# Patient Record
Sex: Female | Born: 1985
Health system: Southern US, Community
[De-identification: ages and names within clinical notes are randomized; demographics above are authoritative.]

## PROBLEM LIST (undated history)

## (undated) ENCOUNTER — Inpatient Hospital Stay (HOSPITAL_COMMUNITY): Payer: Self-pay

## (undated) DIAGNOSIS — O26872 Cervical shortening, second trimester: Secondary | ICD-10-CM

## (undated) DIAGNOSIS — F32A Depression, unspecified: Secondary | ICD-10-CM

## (undated) DIAGNOSIS — G43909 Migraine, unspecified, not intractable, without status migrainosus: Secondary | ICD-10-CM

## (undated) DIAGNOSIS — H9190 Unspecified hearing loss, unspecified ear: Secondary | ICD-10-CM

## (undated) DIAGNOSIS — R55 Syncope and collapse: Secondary | ICD-10-CM

## (undated) DIAGNOSIS — F329 Major depressive disorder, single episode, unspecified: Secondary | ICD-10-CM

## (undated) DIAGNOSIS — F419 Anxiety disorder, unspecified: Secondary | ICD-10-CM

## (undated) HISTORY — DX: Syncope and collapse: R55

## (undated) HISTORY — DX: Major depressive disorder, single episode, unspecified: F32.9

## (undated) HISTORY — DX: Unspecified hearing loss, unspecified ear: H91.90

## (undated) HISTORY — DX: Depression, unspecified: F32.A

## (undated) HISTORY — DX: Anxiety disorder, unspecified: F41.9

---
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
## 2006-02-26 ENCOUNTER — Other Ambulatory Visit: Admission: RE | Admit: 2006-02-26 | Discharge: 2006-02-26 | Payer: Self-pay | Admitting: Internal Medicine

## 2006-11-05 ENCOUNTER — Encounter: Admission: RE | Admit: 2006-11-05 | Discharge: 2006-11-05 | Payer: Self-pay | Admitting: Urology

## 2007-08-03 ENCOUNTER — Other Ambulatory Visit: Admission: RE | Admit: 2007-08-03 | Discharge: 2007-08-03 | Payer: Self-pay | Admitting: Internal Medicine

## 2009-11-05 ENCOUNTER — Other Ambulatory Visit: Admission: RE | Admit: 2009-11-05 | Discharge: 2009-11-05 | Payer: Self-pay | Admitting: Internal Medicine

## 2013-06-24 ENCOUNTER — Encounter (HOSPITAL_COMMUNITY): Payer: Self-pay

## 2013-06-24 ENCOUNTER — Inpatient Hospital Stay (HOSPITAL_COMMUNITY): Payer: 59

## 2013-06-24 ENCOUNTER — Inpatient Hospital Stay (HOSPITAL_COMMUNITY)
Admission: AD | Admit: 2013-06-24 | Discharge: 2013-06-24 | Disposition: A | Discharge status: Home / Self Care | Payer: 59 | Source: Ambulatory Visit | Attending: Obstetrics and Gynecology | Admitting: Obstetrics and Gynecology

## 2013-06-24 DIAGNOSIS — O200 Threatened abortion: Secondary | ICD-10-CM | POA: Insufficient documentation

## 2013-06-24 LAB — HCG, QUANTITATIVE, PREGNANCY: hCG, Beta Chain, Quant, S: 8036 m[IU]/mL — ABNORMAL HIGH (ref <5)

## 2013-06-24 NOTE — MAU Note (Signed)
Carloyn Jaeger, CNM at bedside to speak with pt about discharge.

## 2013-06-24 NOTE — MAU Provider Note (Signed)
History     CSN: [id number]  Arrival date and time: 06/24/13 1741 Provider on unit: 1940 Provider notified: 2000 Provider at bedside: 2005   Chief Complaint  Patient presents with  . Vaginal Bleeding   HPI  [name] is a 27 y.o. G1P0 female at 22.[redacted]wks gestation presenting with vaginal bleeding that started as brown spotting Yesterday increasing to bright red.  She reports the bright, red soaked through a one pantiliner today.  She denies cramping.  History reviewed. No pertinent past medical history.  History reviewed. No pertinent past surgical history.  No family history on file.  History  Substance Use Topics  . Smoking status: Not on file  . Smokeless tobacco: Not on file  . Alcohol Use: Not on file    Allergies:  Allergies  Allergen Reactions  . Sulfur Hives    No prescriptions prior to admission    Review of Systems  Constitutional: Negative.   HENT: Negative.   Eyes: Negative.   Respiratory: Negative.   Cardiovascular: Negative.   Gastrointestinal: Negative.   Genitourinary: Negative.   Musculoskeletal: Negative.   Skin: Negative.   Neurological: Negative.   Endo/Heme/Allergies: Negative.   Psychiatric/Behavioral: Negative.    Physical Exam   Blood pressure 129/84, pulse 96, temperature 98.5 F (36.9 C), temperature source Oral, resp. rate 16, height 5' 4.5" (1.638 m), weight 60.782 kg (134 lb), last menstrual period 05/19/2013, SpO2 100.00%. Results for orders placed during the hospital encounter of 06/24/13 (from the past 24 hour(s))  HCG, QUANTITATIVE, PREGNANCY     Status: Abnormal   Collection Time    06/24/13  7:04 PM      Result Value Range   hCG, Beta Chain, Quant, S 8036 (*) <5 mIU/mL  Previous quant in the office on 06/23/13: >4000  U/S Ob Comp Less 14 Wks  06/24/2013   CLINICAL DATA:  Vaginal bleeding.  EXAM: OBSTETRIC <14 WK [place] AND TRANSVAGINAL OB US  TECHNIQUE: Both transabdominal and transvaginal ultrasound  examinations were performed for complete evaluation of the gestation as well as the maternal uterus, adnexal regions, and pelvic cul-de-sac. Transvaginal technique was performed to assess early pregnancy.  COMPARISON:  None.  FINDINGS: Intrauterine gestational sac: Present but slightly irregular.  Yolk sac:  Present  Embryo:  None  Cardiac Activity: None  Heart Rate:  N/A bpm  MSD:  5.6  mm   5 w   1  d  CRL:     mm    w  d                  [place] EDC: 02/23/2014  Maternal uterus/adnexae: Small subchorionic hemorrhage.  Normal right ovary.  Normal left ovary.  Trace free pelvic fluid.  IMPRESSION: Intrauterine gestational sac estimated at 5 weeks and 1 day gestation. A small yolk sac is identified. No embryo.  Small subchorionic hemorrhage.  Normal ovaries.   Electronically Signed   By: Loralie Champagne M.D.   On: 06/24/2013 19:55    Physical Exam  Constitutional: She is oriented to person, place, and time. She appears well-developed and well-nourished.  HENT:  Head: Normocephalic and atraumatic.  Eyes: Pupils are equal, round, and reactive to light.  Neck: Normal range of motion. Neck supple.  Cardiovascular: Normal rate, regular rhythm and normal heart sounds.   Respiratory: Effort normal and breath sounds normal.  GI: Soft. Bowel sounds are normal.  Genitourinary: Vagina normal and uterus normal.  Musculoskeletal: Normal range of motion.  Neurological:  She is alert and oriented to person, place, and time. She has normal reflexes.  Skin: Skin is warm and dry.  Psychiatric: She has a normal mood and affect. Her behavior is normal. Judgment and thought content normal.  SSE: small amount of brownish blood in vaginal vault, few small blood clots, no active bleeding noted VE: closed / thick / high / firm  MAU Course  Procedures  Beta HCG OB U/S <14wks  Assessment and Plan  27 y.o. SIUP @ 5.1wks Threatened Miscarriage  Discharge Home with bleeding precautions Repeat OB U/S in office in 10  days  *Dr. Cherly Hensen consulted and agrees with plan.  Kenard Gower, MSN, CNM 06/24/2013, 8:03 PM

## 2013-06-24 NOTE — MAU Note (Signed)
Patient states she had a positive pregnancy test yesterday with a hormone of 4000. Had spotting yesterday with heavier bleeding today. Dull cramping in mid abdomen. Last period was 9-11 but had some bleeding for 3 days on 10-4. Nausea with vomiting once this am.

## 2013-06-24 NOTE — MAU Note (Signed)
Pt to U/S, ambulatory.

## 2013-07-15 LAB — OB RESULTS CONSOLE ABO/RH: RH TYPE: POSITIVE

## 2013-07-15 LAB — OB RESULTS CONSOLE HEPATITIS B SURFACE ANTIGEN: Hepatitis B Surface Ag: NEGATIVE

## 2013-07-15 LAB — OB RESULTS CONSOLE HIV ANTIBODY (ROUTINE TESTING): HIV: NONREACTIVE

## 2013-07-15 LAB — OB RESULTS CONSOLE RUBELLA ANTIBODY, IGM: Rubella: IMMUNE

## 2013-07-15 LAB — OB RESULTS CONSOLE ANTIBODY SCREEN: ANTIBODY SCREEN: NEGATIVE

## 2013-07-15 LAB — OB RESULTS CONSOLE RPR: RPR: NONREACTIVE

## 2013-07-25 LAB — OB RESULTS CONSOLE GBS: STREP GROUP B AG: POSITIVE

## 2013-07-27 LAB — OB RESULTS CONSOLE GC/CHLAMYDIA
Chlamydia: NEGATIVE
Gonorrhea: NEGATIVE

## 2013-08-16 ENCOUNTER — Other Ambulatory Visit: Payer: Self-pay

## 2013-09-28 ENCOUNTER — Other Ambulatory Visit (HOSPITAL_COMMUNITY): Payer: Self-pay | Admitting: Certified Nurse Midwife

## 2013-09-28 DIAGNOSIS — Z3689 Encounter for other specified antenatal screening: Secondary | ICD-10-CM

## 2013-09-28 DIAGNOSIS — O283 Abnormal ultrasonic finding on antenatal screening of mother: Secondary | ICD-10-CM

## 2013-09-29 ENCOUNTER — Ambulatory Visit (HOSPITAL_COMMUNITY)
Admission: RE | Admit: 2013-09-29 | Discharge: 2013-09-29 | Disposition: A | Discharge status: Home / Self Care | Payer: 59 | Source: Ambulatory Visit | Attending: Certified Nurse Midwife | Admitting: Certified Nurse Midwife

## 2013-09-29 DIAGNOSIS — O358XX0 Maternal care for other (suspected) fetal abnormality and damage, not applicable or unspecified: Secondary | ICD-10-CM | POA: Insufficient documentation

## 2013-09-29 DIAGNOSIS — Z3689 Encounter for other specified antenatal screening: Secondary | ICD-10-CM

## 2013-09-29 DIAGNOSIS — Z1389 Encounter for screening for other disorder: Secondary | ICD-10-CM | POA: Insufficient documentation

## 2013-09-29 DIAGNOSIS — Z363 Encounter for antenatal screening for malformations: Secondary | ICD-10-CM | POA: Insufficient documentation

## 2013-09-29 DIAGNOSIS — IMO0001 Reserved for inherently not codable concepts without codable children: Secondary | ICD-10-CM | POA: Insufficient documentation

## 2013-09-29 DIAGNOSIS — O283 Abnormal ultrasonic finding on antenatal screening of mother: Secondary | ICD-10-CM

## 2013-09-29 DIAGNOSIS — O4590 Premature separation of placenta, unspecified, unspecified trimester: Secondary | ICD-10-CM | POA: Insufficient documentation

## 2013-11-15 ENCOUNTER — Inpatient Hospital Stay (HOSPITAL_COMMUNITY)
Admission: AD | Admit: 2013-11-15 | Discharge: 2013-11-21 | DRG: 782 | Disposition: A | Discharge status: Home / Self Care | Payer: 59 | Source: Ambulatory Visit | Attending: Obstetrics & Gynecology | Admitting: Obstetrics & Gynecology

## 2013-11-15 ENCOUNTER — Encounter (HOSPITAL_COMMUNITY): Payer: Self-pay | Admitting: *Deleted

## 2013-11-15 ENCOUNTER — Inpatient Hospital Stay (HOSPITAL_COMMUNITY): Payer: 59

## 2013-11-15 DIAGNOSIS — O26879 Cervical shortening, unspecified trimester: Principal | ICD-10-CM | POA: Diagnosis present

## 2013-11-15 DIAGNOSIS — O321XX0 Maternal care for breech presentation, not applicable or unspecified: Secondary | ICD-10-CM | POA: Diagnosis present

## 2013-11-15 DIAGNOSIS — Q270 Congenital absence and hypoplasia of umbilical artery: Secondary | ICD-10-CM

## 2013-11-15 DIAGNOSIS — O09899 Supervision of other high risk pregnancies, unspecified trimester: Secondary | ICD-10-CM

## 2013-11-15 DIAGNOSIS — K5900 Constipation, unspecified: Secondary | ICD-10-CM | POA: Diagnosis present

## 2013-11-15 DIAGNOSIS — K219 Gastro-esophageal reflux disease without esophagitis: Secondary | ICD-10-CM | POA: Diagnosis present

## 2013-11-15 DIAGNOSIS — O26872 Cervical shortening, second trimester: Secondary | ICD-10-CM | POA: Diagnosis present

## 2013-11-15 DIAGNOSIS — O4692 Antepartum hemorrhage, unspecified, second trimester: Secondary | ICD-10-CM | POA: Diagnosis present

## 2013-11-15 DIAGNOSIS — O4700 False labor before 37 completed weeks of gestation, unspecified trimester: Secondary | ICD-10-CM | POA: Diagnosis present

## 2013-11-15 DIAGNOSIS — O4690 Antepartum hemorrhage, unspecified, unspecified trimester: Secondary | ICD-10-CM

## 2013-11-15 HISTORY — DX: Cervical shortening, second trimester: O26.872

## 2013-11-15 MED ORDER — NIFEDIPINE 10 MG PO CAPS
10.0000 mg | ORAL_CAPSULE | Freq: Once | ORAL | Status: AC
  Administered 2013-11-15: 10 mg via ORAL
  Filled 2013-11-15: qty 1

## 2013-11-15 MED ORDER — NIFEDIPINE 10 MG PO CAPS
10.0000 mg | ORAL_CAPSULE | Freq: Four times a day (QID) | ORAL | Status: DC
Start: 2013-11-16 — End: 2013-11-21
  Administered 2013-11-16 – 2013-11-21 (×22): 10 mg via ORAL
  Filled 2013-11-15 (×22): qty 1

## 2013-11-15 MED ORDER — ZOLPIDEM TARTRATE 5 MG PO TABS: 5.0000 mg | ORAL_TABLET | Freq: Every evening | ORAL | Status: DC | PRN

## 2013-11-15 MED ORDER — BETAMETHASONE SOD PHOS & ACET 6 (3-3) MG/ML IJ SUSP
12.0000 mg | INTRAMUSCULAR | Status: AC
  Administered 2013-11-15 – 2013-11-16 (×2): 12 mg via INTRAMUSCULAR
  Filled 2013-11-15 (×2): qty 2

## 2013-11-15 MED ORDER — PRENATAL MULTIVITAMIN CH
1.0000 | ORAL_TABLET | Freq: Every day | ORAL | Status: DC
  Administered 2013-11-16 – 2013-11-21 (×6): 1 via ORAL
  Filled 2013-11-15 (×6): qty 1

## 2013-11-15 MED ORDER — CALCIUM CARBONATE ANTACID 500 MG PO CHEW
2.0000 | CHEWABLE_TABLET | ORAL | Status: DC | PRN
  Administered 2013-11-16: 400 mg via ORAL
  Filled 2013-11-15: qty 1

## 2013-11-15 MED ORDER — DOCUSATE SODIUM 100 MG PO CAPS
100.0000 mg | ORAL_CAPSULE | Freq: Every day | ORAL | Status: DC
  Administered 2013-11-16 – 2013-11-17 (×2): 100 mg via ORAL
  Filled 2013-11-15 (×2): qty 1

## 2013-11-15 NOTE — MAU Note (Addendum)
Sent from office for further evaluation. 2 vessel cord, placental lakes, shortened cervix. Hx several episodes of bleeding, last time was after intercourse.

## 2013-11-15 NOTE — H&P (Signed)
28 yo G1P0 female at 25.[redacted] wks gestation was seen for routine Ob visit in office and was noted to have short cervix. Patient have h/o postcoital bleeding, heavy bright red for few minutes on Sunday that stopped Sunday night (2 days back). Since then she continues to have some brown spotting for 2 days but didn't note much today. She has h/o large subchorionic hemorrhage at 9 wks with heavier bleeding and then what was noted later on was not enlarging Subchorionic hemorrhage but rather a large placental lake at 18 wks when she saw MFM. She has 2 vessel cord and was debating if she needed fetal echo due to deferring options.  Today in office her cervix was palpably short per Dr Algie Coffer and external os was dilated. Office cervical length was 2.3 cm that changed to 2.0 cm after fundal pressure. NST was NR (25 wks) and was sent to MAU for BPP and CL. FFN was cancelled due to blood on the tip.  Patient denies feeling contractions/ leaking fluid. She feels good fetal movements.   No pertinent past medical history. 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 Non-smoker. No pertinent past surgical history.  No family history on file.  History   Substance Use Topics   .  Smoking status:  Never Smoker   .  Smokeless tobacco:  Never Used   .  Alcohol Use:  No    Allergies:  Allergies   Allergen  Reactions   .  Sulfur  Hives    Prescriptions prior to admission   Medication  Sig  Dispense  Refill   .  acetaminophen (TYLENOL) 500 MG tablet  Take 1,000 mg by mouth every 6 (six) hours as needed for pain.     [name]  docusate sodium (COLACE) 100 MG capsule  Take 100 mg by mouth 2 (two) times daily.     [name]  ibuprofen (ADVIL,MOTRIN) 800 MG tablet  Take 800 mg by mouth every 8 (eight) hours as needed for pain.     [name]  ondansetron (ZOFRAN) 4 MG tablet  Take 4 mg by mouth every 8 (eight) hours as needed for nausea.     .  polyethylene glycol (MIRALAX / GLYCOLAX) packet  Take 17 g by mouth daily.     .  Prenatal  Vit-Fe Fumarate-FA (PRENATAL MULTIVITAMIN) TABS tablet  Take 1 tablet by mouth daily at 12 noon.      Review of Systems  Constitutional: Negative.  HENT: Negative.  Eyes: Negative.  Respiratory: Negative.  Cardiovascular: Negative.  Gastrointestinal: Negative.  Genitourinary:  Intermittent vaginal spotting  Musculoskeletal: Negative.  Skin: Negative.  Neurological: Negative.  Endo/Heme/Allergies: Negative.  Psychiatric/Behavioral: Negative.   CEFM:  FHR: 140 / moderate variability / accels present / no decels  Toco: UI with occ. UC / mild to palpation  Ultrasound  BPP: 8/8 / CL 3.8 cm / AFI WNL / Breech  Physical Exam   Blood pressure 118/75, pulse 108, temperature 98.1 F (36.7 C), temperature source Oral, resp. rate 18, last menstrual period 05/19/2013.  Physical Exam  Constitutional: She is oriented to person, place, and time. She appears well-developed and well-nourished.  HENT: neg.  Respiratory: Effort normal. CTA bilateral CV RRR, no tachycardia Gravid, S=D, uterus relaxed, non tender. No palpable contractions Genitourinary: per CNM who checked in MAU- cervix short with external os open and internal os closed.    Sono- Breech, BPP 8/8, trans-abdominal CL 3.8 cm (office transvaginal CL 2-2.3 cm)  Toco- uterine irritability noted   Assessment and Plan   SIUP @ 25.[redacted] wks gestation with second trimester bleeding. Short cervix on office sonogram- possible dynamic cervix. Uterine irritability with occasional contractions.  H/O placental lakes, 2 vessel umbilical cord   Plan: Admit for observation, start low dose Procardia 10mg  q 8 hrs. MFM consult and sono with CL in AM. Betamethasone for FLM in case of early delivery (reviewed benefits with no risks, patient agrees).

## 2013-11-15 NOTE — MAU Provider Note (Signed)
Reviewed and agree with note and plan. V.Jaicion Laurie, MD  

## 2013-11-15 NOTE — MAU Provider Note (Signed)
History     CSN: [id number]  Arrival date and time: 11/15/13 1839 Provider on unit: 1920 Provider at bedside: 1950    Chief Complaint  Patient presents with  . for EFM, shortened cx    HPI  Ms. Kelsha Older is a 28 yo G1P0 female at 25.[redacted] wks gestation sent from office by Dr. Ernestina Penna for BPP, NST and cervical  exam. She was seen in the office for postcoital bleeding that started Sunday bright, red and small to moderate amount The bleeding stopped Sunday night.  She had brown spotting Monday and then this morning, but none since this morning. She had a non-reassuring NST and dynamic cervical length of 2.3 cm that changed to 2.0 cm after fundal pressure. She  has a h/o placental lakes and 2 VC - both of which have been evaluated by MFM.  History reviewed. No pertinent past medical history.  History reviewed. No pertinent past surgical history.  No family history on file.  History  Substance Use Topics  . Smoking status: Never Smoker   . Smokeless tobacco: Never Used  . Alcohol Use: No    Allergies:  Allergies  Allergen Reactions  . Sulfur Hives    Prescriptions prior to admission  Medication Sig Dispense Refill  . acetaminophen (TYLENOL) 500 MG tablet Take 1,000 mg by mouth every 6 (six) hours as needed for pain.      [name] docusate sodium (COLACE) 100 MG capsule Take 100 mg by mouth 2 (two) times daily.      [name] ibuprofen (ADVIL,MOTRIN) 800 MG tablet Take 800 mg by mouth every 8 (eight) hours as needed for pain.      [name] ondansetron (ZOFRAN) 4 MG tablet Take 4 mg by mouth every 8 (eight) hours as needed for nausea.      . polyethylene glycol (MIRALAX / GLYCOLAX) packet Take 17 g by mouth daily.      . Prenatal Vit-Fe Fumarate-FA (PRENATAL MULTIVITAMIN) TABS tablet Take 1 tablet by mouth daily at 12 noon.        Review of Systems  Constitutional: Negative.   HENT: Negative.   Eyes: Negative.   Respiratory: Negative.   Cardiovascular: Negative.   Gastrointestinal:  Negative.   Genitourinary:       Intermittent vaginal spotting  Musculoskeletal: Negative.   Skin: Negative.   Neurological: Negative.   Endo/Heme/Allergies: Negative.   Psychiatric/Behavioral: Negative.    CEFM: FHR: 140 / moderate variability / accels present / no decels Toco: UI with occ. UC / mild to palpation  Ultrasound BPP: 8/8 / CL 3.8 cm / AFI WNL / Breech  Physical Exam   Blood pressure 118/75, pulse 108, temperature 98.1 F (36.7 C), temperature source Oral, resp. rate 18, last menstrual period 05/19/2013.  Physical Exam  Constitutional: She is oriented to person, place, and time. She appears well-developed and well-nourished.  HENT:  Head: Normocephalic and atraumatic.  Eyes: Pupils are equal, round, and reactive to light.  Neck: Normal range of motion.  Respiratory: Effort normal.  GI: Soft.  Genitourinary: Vagina normal and uterus normal.  Gravid, S=D  Musculoskeletal: Normal range of motion.  Neurological: She is alert and oriented to person, place, and time.  Skin: Skin is warm and dry.  Psychiatric: She has a normal mood and affect. Her behavior is normal. Judgment and thought content normal.    MAU Course  Procedures BPP CEFM Assessment and Plan  SIUP @ 25.[redacted] wks gestation H/O placental lakes 2 vessel umbilical cord Dynamic  Shortened cervix Category 1 FHR tracing Preterm Contractions  Transfer MAU care to Dr. Juliene PinaMody for possible antenatal admission or discharge home with close follow-up  Kenard GowerAWSON, Vivaan Helseth, M, MSN, CNM 11/15/2013, 8:09 PM

## 2013-11-16 ENCOUNTER — Ambulatory Visit (HOSPITAL_COMMUNITY): Payer: 59

## 2013-11-16 ENCOUNTER — Inpatient Hospital Stay (HOSPITAL_COMMUNITY): Payer: 59

## 2013-11-16 ENCOUNTER — Encounter (HOSPITAL_COMMUNITY): Payer: Self-pay | Admitting: Obstetrics & Gynecology

## 2013-11-16 ENCOUNTER — Encounter (HOSPITAL_COMMUNITY): Payer: 59

## 2013-11-16 DIAGNOSIS — O26872 Cervical shortening, second trimester: Secondary | ICD-10-CM

## 2013-11-16 HISTORY — DX: Cervical shortening, second trimester: O26.872

## 2013-11-16 LAB — CBC
HCT: 35.3 % — ABNORMAL LOW (ref 36.0–46.0)
HEMOGLOBIN: 11.8 g/dL — AB (ref 12.0–15.0)
MCH: 29.1 pg (ref 26.0–34.0)
MCHC: 33.4 g/dL (ref 30.0–36.0)
MCV: 87.2 fL (ref 78.0–100.0)
PLATELETS: 284 10*3/uL (ref 150–400)
RBC: 4.05 MIL/uL (ref 3.87–5.11)
RDW: 13.1 % (ref 11.5–15.5)
WBC: 15.4 10*3/uL — ABNORMAL HIGH (ref 4.0–10.5)

## 2013-11-16 MED ORDER — BUTALBITAL-APAP-CAFFEINE 50-325-40 MG PO TABS
1.0000 | ORAL_TABLET | ORAL | Status: DC | PRN
  Administered 2013-11-16 – 2013-11-18 (×2): 1 via ORAL
  Filled 2013-11-16 (×2): qty 1

## 2013-11-16 MED ORDER — PROGESTERONE MICRONIZED 200 MG PO CAPS
200.0000 mg | ORAL_CAPSULE | Freq: Every day | ORAL | Status: DC
  Administered 2013-11-16 – 2013-11-20 (×5): 200 mg via VAGINAL
  Filled 2013-11-16 (×7): qty 1

## 2013-11-16 MED ORDER — SALINE SPRAY 0.65 % NA SOLN
1.0000 | NASAL | Status: DC | PRN
  Administered 2013-11-17: 1 via NASAL
  Filled 2013-11-16: qty 44

## 2013-11-16 NOTE — Progress Notes (Addendum)
Patient ID: Chapman FitchStephanie T Micciche, female   DOB: 01/27/1986, 28 y.o.   MRN: [id number]   G1, 25.6 wks, Short cervix with funneling 1-1.5 cm, V funnel.  VTX, 1'14" at 53% (sono - MFM 3/11)  2nd trimester bleeding, 2-vessel cord, placental lakes   Subjective: Denies active bleeding. Good fetal movement, no pelvic pressure, no fluid leak.  Feels some mild cramping off and on, some back pain but that is more stiffness. Stuffy nose, no URI symptoms. No UTI symptoms (sending urine culture), no dental infection or other source of infection by review of system.  C/o HA, has Migraines, Fioricet helps, ordered.  Objective: Vital signs in last 24 hours: Temp:  [97.6 F (36.4 C)-98.4 F (36.9 C)] 97.6 F (36.4 C) (03/11 0815) Pulse Rate:  [95-116] 116 (03/11 0815) Resp:  [18-20] 20 (03/11 0815) BP: (107-127)/(57-81) 127/74 mmHg (03/11 0815) Weight:  [145 lb (65.772 kg)] 145 lb (65.772 kg) (03/10 2219) Weight change:   A&O x 3, no acute distress. Pleasant Abdo soft, non tender, non acute, relaxed gravid uterus Extr no edema/ tenderness Pelvic deferrerd FHT  140s/ mod variability/ 10x10 accels/ no decels, reactive  Toco irritability without contractions  Recent Labs  11/16/13 1011  WBC 15.4*  HGB 11.8*  HCT 35.3*  PLT 284   Urine culture to be sent today  Medications:  Procardia 10 mg QID Betamethasone 1st dose last night, repeat in 24 hr Prometrium vaginal 200 mg daily (starting now) Magnesium sulphate- HOLD, will start for imminent PTL Fioricet PRN  Assessment/Plan: 25.6 wks, short cervix, 1-1.5 cm, V shape funnel today. EFW 1'14" at 53% Placental lakes, 2 vessel cord.  NST reactive, monitor 30 minutes every 4 hrs, continuous toco monitoring  Bedrest, Vaginal Progesterone, Procardia 10 mg q 6 hrs, BTMZ #2 tonight.  Reviewed Magnesium for neuroprophylaxis 12 hrs as needed S/p MFM consult (Dr Janeal HolmesWhittekar) NICU consult pending Fetal Echo - in house on 3/12 is arranged (2 V cord)    Plan to keep in house per MFM recommendations, likely at least until 28 wks.  Repeat sono CL on Mon- 3/16 Repeat growth sono in 3 wks.  Patient counseled, reviewed findings and plan, voiced understanding.   Marizol Borror R 11/16/2013, 11:28 AM

## 2013-11-16 NOTE — Consult Note (Signed)
Maternal Fetal Medicine Consultation  Requesting Provider(s): Shea EvansVaishali Mody, MD  Reason for consultation: Single umbilical artery, shortened cervix  HPI: Candice [name] is a 28 year old G1P0 seen for consultation due to shortened cervix and single umbilical artery.  In early pregnancy, she had heavy vaginal bleeding with a subchorionic hemorrhage that has since resolved.  The patient reports that she had some post-coital bleeding three nights ago that resolved shortly afterward.  Since that time, she has had brownish vaginal discharge, but no active bleeding.  She was seen in clinic earlier this week and noted to have a shortened cervix by exam.  The patient was admitted and is currently undergoing a course of betamethasone for fetal lung maturity.  On previous exam, the fetus was noted to have an isolated single umbilical artery.  She initially declined further work up, but has since decided to get a fetal echo that was previously scheduled for tomorrow as an outpatient.  Candice [name] is currently without complaints.  She denies uterine contractions, ongoing vaginal bleeding or leakage of fluid.  The fetus is active.  OB History: OB History   Grav Para Term Preterm Abortions TAB SAB Ect Mult Living   1         0      PMH: History reviewed. No pertinent past medical history.  PSH: History reviewed. No pertinent past surgical history.  Meds:  Scheduled Meds: . betamethasone acetate-betamethasone sodium phosphate  12 mg Intramuscular Q24H  . docusate sodium  100 mg Oral Daily  . NIFEdipine  10 mg Oral 4 times per day  . prenatal multivitamin  1 tablet Oral Q1200   Continuous Infusions:  PRN Meds:.butalbital-acetaminophen-caffeine, calcium carbonate, zolpidem  Allergies:  Allergies  Allergen Reactions  . Sulfur Hives   FH: denies family history of birth defects or hereditary disorders  Soc:  History   Social History  . Marital Status: Single    Spouse Name: N/A    Number of  Children: N/A  . Years of Education: N/A   Occupational History  . Not on file.   Social History Main Topics  . Smoking status: Never Smoker   . Smokeless tobacco: Never Used  . Alcohol Use: No  . Drug Use: No  . Sexual Activity: Yes   Other Topics Concern  . Not on file   Social History Narrative  . No narrative on file    Review of Systems: no vaginal bleeding or cramping/contractions, no LOF, no nausea/vomiting. All other systems reviewed and are negative.  PE:   Filed Vitals:   11/16/13 0815  BP: 127/74  Pulse: 116  Temp: 97.6 F (36.4 C)  Resp: 20    GEN: well-appearing female ABD: gravid, NT  Ultrasound: Single IUP at 25 6/7 weeks Single umbilical artery- appears to be an isolated finding Interval anatomy is otherwise within normal limits Interval growth is appropriate (53rd %tile) Anterior placenta with succenturiate lobe on the posterior wall.  No sonographic findings suggestive of placental abruption Normal amniotic fluid volume  TVUS - dynamic cervix measuring 1.0- 1.5 cm.  Some V-shaped funneling noted   A/P: 1) Single IUP at 25 6/7 weeks         2) Single umbilical artery - appears to be an isolated finding.  Patient was tentatively scheduled for fetal echo as an outpatient tomorrow - may need to be rearranged following her discharge if unable to complete as an inpatient.  Also recommend follow up ultrasound for growth in  6 weeks.  Please contact our office if you would prefer that this be scheduled with [place].         3) Shortened cervix - on cervical exam today, the cervix measures 1.0-1.5 cm with some V-shaped funneling.  Concur with course of betamethasone.  Would recommend adding vaginal progesterone due to shortened cervix. In the event that she has a significant clinical change or if delivery appears imminent, would administer a 12 hr course of Magnesium sulfate for Neurooprophylaxis.  Although there is no real consensus about how long she should  remain on observation as an inpatient, I would recommend that she continue with inpatient observation at least through the week.  Assuming she remains stable without any active vaginal bleeding, would recommend hospital discharge with close outpatient follow up. She will need some limitations to her activity (modified bedrest at home).   Thank you for the opportunity to be a part of the care of Candice [name]. Please contact our office if we can be of further assistance.   I spent approximately 30 minutes with this patient with over 50% of time spent in face-to-face counseling.  Alpha Gula, MD Maternal Fetal Medicine

## 2013-11-17 LAB — URINE CULTURE
COLONY COUNT: NO GROWTH
CULTURE: NO GROWTH

## 2013-11-17 MED ORDER — PANTOPRAZOLE SODIUM 40 MG PO TBEC
40.0000 mg | DELAYED_RELEASE_TABLET | Freq: Every day | ORAL | Status: DC
  Administered 2013-11-17 – 2013-11-21 (×5): 40 mg via ORAL
  Filled 2013-11-17 (×6): qty 1

## 2013-11-17 MED ORDER — DOCUSATE SODIUM 100 MG PO CAPS
100.0000 mg | ORAL_CAPSULE | Freq: Two times a day (BID) | ORAL | Status: DC
Start: 2013-11-17 — End: 2013-11-21
  Administered 2013-11-17 – 2013-11-21 (×8): 100 mg via ORAL
  Filled 2013-11-17 (×8): qty 1

## 2013-11-17 NOTE — Consult Note (Signed)
Reason for Consultation: Single umbilical artery  History of Present Illness: I had the pleasure of seeing Candice [name] for fetal cardiac consultation and fetal echocardiography at the request of Dr Juliene Pina.  Candice [name] is a 28 year old G1P0 woman currently 26 0/[redacted] weeks pregnant with a single female fetus. The indication for today's visit includes single umbilical artery.  Complications of her current pregnancy include premature labor and bleeding. She denies any other complications with this pregnancy. She has felt good fetal movement. The available medical record was reviewed in detail and is in agreement with the HPI and past medical history.   Past Medical History: Past Medical History  Diagnosis Date  . Short cervix in second trimester, antepartum 11/16/2013    History reviewed. No pertinent past surgical history.    Medications: Current Facility-Administered Medications  Medication Dose Route Frequency Provider Last Rate Last Dose  . butalbital-acetaminophen-caffeine (FIORICET, ESGIC) 50-325-40 MG per tablet 1 tablet  1 tablet Oral Q4H PRN Robley Fries, MD   1 tablet at 11/16/13 0959  . calcium carbonate (TUMS - dosed in mg elemental calcium) chewable tablet 400 mg of elemental calcium  2 tablet Oral Q4H PRN Robley Fries, MD   400 mg of elemental calcium at 11/16/13 2253  . docusate sodium (COLACE) capsule 100 mg  100 mg Oral Daily Robley Fries, MD   100 mg at 11/17/13 1000  . NIFEdipine (PROCARDIA) capsule 10 mg  10 mg Oral 4 times per day Robley Fries, MD   10 mg at 11/17/13 1219  . prenatal multivitamin tablet 1 tablet  1 tablet Oral Q1200 Robley Fries, MD   1 tablet at 11/17/13 1000  . progesterone (PROMETRIUM) capsule 200 mg  200 mg Vaginal Daily Robley Fries, MD   200 mg at 11/16/13 2158  . sodium chloride (OCEAN) 0.65 % nasal spray 1 spray  1 spray Each Nare PRN Robley Fries, MD      . zolpidem (AMBIEN) tablet 5 mg  5 mg Oral QHS PRN Robley Fries, MD          Allergies: Allergies  Allergen Reactions  . Sulfur Hives    Family History: There is no known family history of congenital heart disease, arrhythmias, sudden cardiac death, or other birth defects.  Social History: History  Smoking status  . Never Smoker   Smokeless tobacco  . Never Used   She is a Investment banker, operational at [employer].  Review of Systems A 10+ point review of systems is negative except as detailed in HPI.  Objective: BP 118/69  Pulse 105  Temp(Src) 98.1 F (36.7 C) (Oral)  Resp 18  Ht 5\' 4"  (1.626 m)  Wt 65.772 kg (145 lb)  BMI 24.88 kg/m2  LMP 05/19/2013  Patient is well appearing and in no distress.  She has normal work of breathing. Abdomen is significant for a gravida uterus but is otherwise soft and non-tender. Extremities - no swelling or edema noted. Neuro - grossly intact without focal deficits.  Fetal Echocardiogram: A complete study was performed today, which was technically good.  There is normal fetal cardiac anatomy and function.  No major heart disease was identified.  Please see separate fetal echo report in Epic for full details.  Final Diagnosis: 1. Normal fetal cardiac anatomy and function. 2. Single umbilical artery.  Discussion: I am happy to report that  Candice [name] fetal heart appears normal. These results were discussed in detail, and all questions  were answered.  There is no fetal cardiac indication to alter her prenatal care or delivery plan.  The fetus does not require any further cardiac testing.    The limitations to fetal echocardiography include the presence of small to moderate atrial and ventricular septal defects, mild valve abnormalities, persistence of the ductus arteriosus after birth, postnatal development of coarctation of the aorta, and other cardiac and vascular anomalies too subtle to be imaged prenatally. These limitations were discussed with the patient and her family.  Although no scheduled postnatal cardiac testing is  indicated at this time, the baby should have appropriate cardiac testing if there are any clinical concerns after delivery.    Recommendations: No further prenatal cardiac follow-up is required unless any new cardiac concerns are noted.  No postnatal cardiology consultation or echocardiogram is required unless clinically indicated after delivery.  It was my pleasure to meet and evaluate Candice [name] today. If there are any questions or concerns regarding this evaluation, please do not hesitate to contact me.     Sincerely, Darlis LoanGreg Korver Graybeal, MD Assistant Professor Pediatric Cardiology [facility] Phone: [phone], Fax: [phone] [facility] Phone: [phone], Fax: [phone] On call: [phone] or [phone] [email or web address]

## 2013-11-17 NOTE — Progress Notes (Signed)
NICU tour done

## 2013-11-17 NOTE — Progress Notes (Signed)
S: no complaint HD #2 IUP@ 26 wks Shortened cervix S/P BMZ  O: VS BP 122/76 temp 98.1  Lungs  Clear to A Cor RRR Abd; gravid soft nontender Ext (-) calf tenderness or edema Pelvic ; deferred FHR 140's no ctx Fetal echo pending  IMP: shortened cervix on vaginal suppository and oral procardia IUP @ 26 weeks S/p BMZ #1 Two vessel cord  P) await fetal echo result. Cont inpt status. sono 3/16. NICU consult ordered. Disc issue of prematurity Change to TID testing

## 2013-11-17 NOTE — Progress Notes (Signed)
Bedside Fetal Echo now

## 2013-11-18 MED ORDER — ACETAMINOPHEN 325 MG PO TABS
650.0000 mg | ORAL_TABLET | Freq: Four times a day (QID) | ORAL | Status: DC | PRN
  Administered 2013-11-20: 650 mg via ORAL
  Filled 2013-11-18: qty 2

## 2013-11-18 NOTE — Consult Note (Addendum)
Late entry - patient seen 11/17/13 at 13:30  Asked by Dr. Cherly Hensenousins to provide prenatal consultation for patient at risk for preterm delivery due to shortening of the cervix.  Mother is 28 y.o. G1 who is now 1726 weeks EGA; she was admitted 3/10 and has been treated with betamethasone and vaginal progesterone.  Two-vessel cord was also noted and fetal echo showed normal structure and function.  Discussed usual expectations for preterm infant at 26+ weeks gestation, including possible needs for DR resuscitation, respiratory support, IV access, and blood products.  Also presented risks of death or serious morbidity, including lung disease and developmental disability.  Projected possible length of stay in NICU until 36 - [redacted] wks EGA.  Discussed advantages of feeding with mother's milk; she plans to pump postnatally and breast feed eventually..  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  She and her husband were attentive, had appropriate questions, and were appreciative of my input.  Thank you for the consultation.  Candice [name], Jr., MD  Total time 30 minutes, face-to-face time 20 minutes

## 2013-11-18 NOTE — Progress Notes (Signed)
S: no complaint HD #3 No contractions, no bleeding, good FM. Pt with occasional HA and would like tylenol. Some mild constipation, Colace increased to bid yest. Some GERD. Occ HA and flushing with procardia use.  IUP@ 26 wks Shortened cervix S/P BMZ  O: Filed Vitals:   11/17/13 2024 11/17/13 2338 11/18/13 0949 11/18/13 1200  BP: 112/69 110/66 117/64 118/74  Pulse: 104 102 90 85  Temp: 97.9 F (36.6 C) 98.6 F (37 C) 98.1 F (36.7 C) 98.4 F (36.9 C)  TempSrc: Oral Oral Oral Oral  Resp: 18 20 24 18   Height:      Weight:        Abd; gravid soft nontender Ext (-) calf tenderness or edema Pelvic ; deferred FHR 140's no ctx Fetal echo normal  IMP: shortened cervix on vaginal suppository and oral procardia IUP @ 26 weeks S/p BMZ Two vessel cord, nl fetal echo  P)  Cont inpt status, assess for stability. Tid fetal monitoring. sono 3/16. NICU consult done. Pt understands issues of prematurity. Wheelchair privilege daily. Colace, Protonix, PNV. Cont vaginal prometrium. Mag sulfate if active labor ensues. Reactive fetal testing.  Andrej Spagnoli A. 11/18/2013 1:20 PM

## 2013-11-18 NOTE — Progress Notes (Signed)
Chip BoerVicki with Perinatal Education came to educate Bevelyn NgoStephanie Garms, and gave her a See What You Read education booklet.

## 2013-11-20 NOTE — Progress Notes (Signed)
S: no complaint HD #5 No contractions, no bleeding, good FM. Shortness of breath, needs effort to take deep breath. No cough/ palpitations.  GERD, constipation, HA better.   IUP@ 26.3 wks Shortened cervix at 1-1.5 cm with V funnel (3/11 sono) S/P BMZ 2 vessel cord, s/p Echo  O:  BP 114/64  Pulse 100  Temp(Src) 98.4 F (36.9 C) (Oral)  Resp 18  Ht 5\' 4"  (1.626 m)  Wt 145 lb (65.772 kg)  BMI 24.88 kg/m2  SpO2 97%  LMP 05/19/2013 HEENT neg , no apparent SOB Lungs CTA bilateral CV RRR Abd; gravid soft non tender, relaxed Ext (-) calf tenderness or edema Pelvic ; deferred FHR 140's no ctx  IMP: shortened cervix on vaginal suppository and oral procardia IUP @ 26.3 weeks S/p BMZ Two vessel cord, nl fetal echo  Plan: Stable. Sono for f/up CL tomorrow with MFM and discharge planning since patient anxious and request D/c home if stable. Has extended family available to assist with complete bed-rest. [facility], but [facility].  Pt understands issues of prematurity s/p NICU consult. Continue Vaginal Prometrium. Mag sulfate if active labor ensues. Procardia. Protonix, PNVit, Fiorecet prn. Reactive fetal testing.  Ulyssa Walthour R 11/20/2013 2:51 PM

## 2013-11-21 ENCOUNTER — Inpatient Hospital Stay (HOSPITAL_COMMUNITY): Payer: 59

## 2013-11-21 MED ORDER — NIFEDIPINE 10 MG PO CAPS: 10.0000 mg | ORAL_CAPSULE | Freq: Four times a day (QID) | ORAL | Status: DC

## 2013-11-21 MED ORDER — PROGESTERONE MICRONIZED 200 MG PO CAPS: 200.0000 mg | ORAL_CAPSULE | Freq: Every day | ORAL | Status: DC

## 2013-11-21 NOTE — Progress Notes (Signed)
Post discharge ur review completed. 

## 2013-11-21 NOTE — Progress Notes (Signed)
Reviewed dc instructions no questions or concerns noted pt voiced understanding

## 2013-11-21 NOTE — Discharge Summary (Signed)
  ANTENATAL DISCHARGE SUMMARY  Patient ID: Candice [name] MRN: [id number] DOB/AGE: 30/10/1985 30 y.o.  Admit date: 11/15/2013 Discharge date: 11/21/2013  Admission Diagnoses: 25+ wks shortened cervix  Discharge Diagnoses: 26+ wks shortedned cervix         Discharged Condition: stable  Hospital Course: Stable  Consults: NICU and MFM  Treatments:  Bed rest, Progesterone Intravaginally, Nifedipine PO, BetaMethasone.  Disposition: home     Medication List    STOP taking these medications       ibuprofen 800 MG tablet  Commonly known as:  ADVIL,MOTRIN      TAKE these medications       acetaminophen 500 MG tablet  Commonly known as:  TYLENOL  Take 1,000 mg by mouth every 6 (six) hours as needed for pain.     butalbital-acetaminophen-caffeine 50-325-40 MG per tablet  Commonly known as:  FIORICET, ESGIC  Take 1 tablet by mouth daily as needed for headache or migraine.     docusate sodium 100 MG capsule  Commonly known as:  COLACE  Take 100 mg by mouth 2 (two) times daily.     NIFEdipine 10 MG capsule  Commonly known as:  PROCARDIA  Take 1 capsule (10 mg total) by mouth every 6 (six) hours.     ondansetron 4 MG tablet  Commonly known as:  ZOFRAN  Take 4 mg by mouth every 8 (eight) hours as needed for nausea.     polyethylene glycol packet  Commonly known as:  MIRALAX / GLYCOLAX  Take 17 g by mouth daily.     pramoxine-mineral oil-zinc 1-12.5 % rectal ointment  Commonly known as:  TUCKS  Place 1 application rectally every 2 (two) hours as needed for itching.     prenatal multivitamin Tabs tablet  Take 1 tablet by mouth daily at 12 noon.     progesterone 200 MG capsule  Commonly known as:  PROMETRIUM  Place 1 capsule (200 mg total) vaginally daily.           Follow-up Information   Follow up with COUSINS,SHERONETTE A, MD In 4 days.   Specialty:  Obstetrics and Gynecology   Contact information:   [address] [place] [id number] [phone]        Signed: Genia DelLAVOIE,MARIE-LYNE, MD MD 11/21/2013, 10:26 AM

## 2013-11-21 NOTE — Progress Notes (Signed)
Pt left unit via w/c ride with family for dc

## 2013-11-21 NOTE — Discharge Instructions (Signed)
Pelvic Rest °Pelvic rest is sometimes recommended for women when:  °· The placenta is partially or completely covering the opening of the cervix (placenta previa). °· There is bleeding between the uterine wall and the amniotic sac in the first trimester (subchorionic hemorrhage). °· The cervix begins to open without labor starting (incompetent cervix, cervical insufficiency). °· The labor is too early (preterm labor). °HOME CARE INSTRUCTIONS °· Do not have sexual intercourse, stimulation, or an orgasm. °· Do not use tampons, douche, or put anything in the vagina. °· Do not lift anything over 10 pounds (4.5 kg). °· Avoid strenuous activity or straining your pelvic muscles. °SEEK MEDICAL CARE IF:  °· You have any vaginal bleeding during pregnancy. Treat this as a potential emergency. °· You have cramping pain felt low in the stomach (stronger than menstrual cramps). °· You notice vaginal discharge (watery, mucus, or bloody). °· You have a low, dull backache. °· There are regular contractions or uterine tightening. °SEEK IMMEDIATE MEDICAL CARE IF: °You have vaginal bleeding and have placenta previa.  °Document Released: 12/20/2010 Document Revised: 11/17/2011 Document Reviewed: 12/20/2010 °ExitCare® Patient Information ©2014 [employer]. ° °

## 2013-11-21 NOTE — Progress Notes (Signed)
HD#6  26 4/7 wks with shortened cervix  S:No contractions, no AF leak, no bleeding, good FM. Shortness of breath, needs effort to take deep breath. No cough/ palpitations.  GERD, constipation, no HA currently.   Shortened cervix at 1-1.5 cm with V funnel (3/11 sono)  S/P BMZ  2 vessel cord, s/p Echo   O:  BP 114/64  Pulse 100  Temp(Src) 98.4 F (36.9 C) (Oral)  Resp 18  Ht 5\' 4"  (1.626 m)  Wt 145 lb (65.772 kg)  BMI 24.88 kg/m2  SpO2 97%  LMP 05/19/2013  HEENT neg , no apparent SOB  Lungs CTA bilateral  CV RRR  Abd; gravid soft non tender, relaxed  Ext (-) calf tenderness or edema  Pelvic ; deferred  FHR 140's, reactive.  No ctx. US this morning with MFM:  Cervix stable to slightly improved.  1.25-1.5 cm length of closed cervix.  V-funneling.                                               Pictures reviewed with Dr Sherrie Georgeecker.  IMP: shortened cervix on vaginal suppository and oral procardia, stable to slightly improved per US this am. IUP @ 26 4/7 weeks  S/p BMZ  Two vessel cord, nl fetal echo   Plan:  Has extended family available to assist with complete bed-rest. [facility], but [facility].  Decision supported by MFM to D/C today on complete bed rest.  F/U repeat US CL with MFM Friday and [facility] right after. Pt understands issues of prematurity s/p NICU consult.  Continue Vaginal Prometrium. Mag sulfate if active labor ensues. Procardia. Protonix, PNVit, Fiorecet prn.  Reactive fetal testing.

## 2014-02-03 ENCOUNTER — Encounter (HOSPITAL_COMMUNITY): Payer: 59 | Admitting: Anesthesiology

## 2014-02-03 ENCOUNTER — Encounter (HOSPITAL_COMMUNITY): Payer: Self-pay

## 2014-02-03 ENCOUNTER — Inpatient Hospital Stay (HOSPITAL_COMMUNITY): Payer: 59 | Admitting: Anesthesiology

## 2014-02-03 ENCOUNTER — Encounter (HOSPITAL_COMMUNITY)
Admission: AD | Disposition: A | Discharge status: Home / Self Care | Payer: Self-pay | Source: Ambulatory Visit | Attending: Obstetrics and Gynecology

## 2014-02-03 ENCOUNTER — Inpatient Hospital Stay (HOSPITAL_COMMUNITY)
Admission: AD | Admit: 2014-02-03 | Discharge: 2014-02-06 | DRG: 765 | Disposition: A | Discharge status: Home / Self Care | Payer: 59 | Source: Ambulatory Visit | Attending: Obstetrics and Gynecology | Admitting: Obstetrics and Gynecology

## 2014-02-03 DIAGNOSIS — O9903 Anemia complicating the puerperium: Secondary | ICD-10-CM | POA: Diagnosis not present

## 2014-02-03 DIAGNOSIS — O321XX0 Maternal care for breech presentation, not applicable or unspecified: Principal | ICD-10-CM | POA: Diagnosis present

## 2014-02-03 DIAGNOSIS — D62 Acute posthemorrhagic anemia: Secondary | ICD-10-CM | POA: Diagnosis not present

## 2014-02-03 DIAGNOSIS — O26879 Cervical shortening, unspecified trimester: Secondary | ICD-10-CM | POA: Diagnosis present

## 2014-02-03 DIAGNOSIS — O99892 Other specified diseases and conditions complicating childbirth: Secondary | ICD-10-CM | POA: Diagnosis present

## 2014-02-03 DIAGNOSIS — O9989 Other specified diseases and conditions complicating pregnancy, childbirth and the puerperium: Secondary | ICD-10-CM

## 2014-02-03 DIAGNOSIS — O26872 Cervical shortening, second trimester: Secondary | ICD-10-CM

## 2014-02-03 DIAGNOSIS — Z22330 Carrier of Group B streptococcus: Secondary | ICD-10-CM

## 2014-02-03 HISTORY — DX: Migraine, unspecified, not intractable, without status migrainosus: G43.909

## 2014-02-03 LAB — CBC
HCT: 33.7 % — ABNORMAL LOW (ref 36.0–46.0)
HEMOGLOBIN: 10.8 g/dL — AB (ref 12.0–15.0)
MCH: 27.3 pg (ref 26.0–34.0)
MCHC: 32 g/dL (ref 30.0–36.0)
MCV: 85.1 fL (ref 78.0–100.0)
Platelets: 245 10*3/uL (ref 150–400)
RBC: 3.96 MIL/uL (ref 3.87–5.11)
RDW: 13.1 % (ref 11.5–15.5)
WBC: 14.5 10*3/uL — ABNORMAL HIGH (ref 4.0–10.5)

## 2014-02-03 SURGERY — Surgical Case
Anesthesia: Spinal | Site: Abdomen

## 2014-02-03 MED ORDER — PENICILLIN G POTASSIUM 5000000 UNITS IJ SOLR
5.0000 10*6.[IU] | Freq: Once | INTRAVENOUS | Status: AC
  Administered 2014-02-03: 5 10*6.[IU] via INTRAVENOUS
  Filled 2014-02-03: qty 5

## 2014-02-03 MED ORDER — ONDANSETRON HCL 4 MG/2ML IJ SOLN
INTRAMUSCULAR | Status: AC
  Filled 2014-02-03: qty 2

## 2014-02-03 MED ORDER — FLEET ENEMA 7-19 GM/118ML RE ENEM: 1.0000 | ENEMA | RECTAL | Status: DC | PRN

## 2014-02-03 MED ORDER — FENTANYL CITRATE 0.05 MG/ML IJ SOLN
INTRAMUSCULAR | Status: DC | PRN
  Administered 2014-02-03: 90 ug via INTRAVENOUS
  Administered 2014-02-03: 10 ug via INTRATHECAL

## 2014-02-03 MED ORDER — OXYTOCIN 40 UNITS IN LACTATED RINGERS INFUSION - SIMPLE MED: 62.5000 mL/h | INTRAVENOUS | Status: DC

## 2014-02-03 MED ORDER — FENTANYL CITRATE 0.05 MG/ML IJ SOLN
INTRAMUSCULAR | Status: AC
  Filled 2014-02-03: qty 2

## 2014-02-03 MED ORDER — OXYTOCIN BOLUS FROM INFUSION
500.0000 mL | INTRAVENOUS | Status: DC
Start: 2014-02-03 — End: 2014-02-03

## 2014-02-03 MED ORDER — BUPIVACAINE HCL (PF) 0.25 % IJ SOLN
INTRAMUSCULAR | Status: DC | PRN
  Administered 2014-02-03: 7 mL

## 2014-02-03 MED ORDER — CEFAZOLIN SODIUM-DEXTROSE 2-3 GM-% IV SOLR
INTRAVENOUS | Status: DC | PRN
  Administered 2014-02-03: 2 g via INTRAVENOUS

## 2014-02-03 MED ORDER — ONDANSETRON HCL 4 MG/2ML IJ SOLN: 4.0000 mg | Freq: Four times a day (QID) | INTRAMUSCULAR | Status: DC | PRN

## 2014-02-03 MED ORDER — PROMETHAZINE HCL 25 MG/ML IJ SOLN: 6.2500 mg | INTRAMUSCULAR | Status: DC | PRN

## 2014-02-03 MED ORDER — EPHEDRINE SULFATE 50 MG/ML IJ SOLN
INTRAMUSCULAR | Status: DC | PRN
  Administered 2014-02-03: 10 mg via INTRAVENOUS

## 2014-02-03 MED ORDER — MEPERIDINE HCL 25 MG/ML IJ SOLN: 6.2500 mg | INTRAMUSCULAR | Status: DC | PRN

## 2014-02-03 MED ORDER — ATROPINE SULFATE 0.4 MG/ML IJ SOLN
INTRAMUSCULAR | Status: AC
  Filled 2014-02-03: qty 1

## 2014-02-03 MED ORDER — OXYTOCIN 10 UNIT/ML IJ SOLN
40.0000 [IU] | INTRAVENOUS | Status: DC | PRN
  Administered 2014-02-03: 40 [IU] via INTRAVENOUS

## 2014-02-03 MED ORDER — IBUPROFEN 600 MG PO TABS: 600.0000 mg | ORAL_TABLET | Freq: Four times a day (QID) | ORAL | Status: DC | PRN

## 2014-02-03 MED ORDER — KETOROLAC TROMETHAMINE 30 MG/ML IJ SOLN: 30.0000 mg | Freq: Four times a day (QID) | INTRAMUSCULAR | Status: DC | PRN

## 2014-02-03 MED ORDER — BUPIVACAINE HCL (PF) 0.25 % IJ SOLN
INTRAMUSCULAR | Status: AC
  Filled 2014-02-03: qty 30

## 2014-02-03 MED ORDER — MORPHINE SULFATE (PF) 0.5 MG/ML IJ SOLN
INTRAMUSCULAR | Status: DC | PRN
  Administered 2014-02-03: 200 ug via INTRATHECAL

## 2014-02-03 MED ORDER — CEFAZOLIN SODIUM-DEXTROSE 2-3 GM-% IV SOLR
INTRAVENOUS | Status: AC
  Filled 2014-02-03: qty 50

## 2014-02-03 MED ORDER — LACTATED RINGERS IV SOLN
INTRAVENOUS | Status: DC | PRN
  Administered 2014-02-03: 23:00:00 via INTRAVENOUS

## 2014-02-03 MED ORDER — ONDANSETRON HCL 4 MG/2ML IJ SOLN
INTRAMUSCULAR | Status: DC | PRN
  Administered 2014-02-03: 4 mg via INTRAVENOUS

## 2014-02-03 MED ORDER — FENTANYL CITRATE 0.05 MG/ML IJ SOLN
25.0000 ug | INTRAMUSCULAR | Status: DC | PRN
  Administered 2014-02-04 (×2): 50 ug via INTRAVENOUS

## 2014-02-03 MED ORDER — OXYTOCIN 10 UNIT/ML IJ SOLN
INTRAMUSCULAR | Status: AC
  Filled 2014-02-03: qty 4

## 2014-02-03 MED ORDER — OXYCODONE-ACETAMINOPHEN 5-325 MG PO TABS: 1.0000 | ORAL_TABLET | ORAL | Status: DC | PRN

## 2014-02-03 MED ORDER — LACTATED RINGERS IV SOLN
INTRAVENOUS | Status: DC
Start: 2014-02-03 — End: 2014-02-03
  Administered 2014-02-03 (×2): via INTRAVENOUS
  Administered 2014-02-03: 125 mL/h via INTRAVENOUS

## 2014-02-03 MED ORDER — MORPHINE SULFATE 0.5 MG/ML IJ SOLN
INTRAMUSCULAR | Status: AC
  Filled 2014-02-03: qty 10

## 2014-02-03 MED ORDER — PHENYLEPHRINE 40 MCG/ML (10ML) SYRINGE FOR IV PUSH (FOR BLOOD PRESSURE SUPPORT)
PREFILLED_SYRINGE | INTRAVENOUS | Status: AC
  Filled 2014-02-03: qty 5

## 2014-02-03 MED ORDER — MIDAZOLAM HCL 2 MG/2ML IJ SOLN: 0.5000 mg | Freq: Once | INTRAMUSCULAR | Status: AC | PRN

## 2014-02-03 MED ORDER — PHENYLEPHRINE HCL 10 MG/ML IJ SOLN
INTRAMUSCULAR | Status: AC
  Filled 2014-02-03: qty 1

## 2014-02-03 MED ORDER — BUPIVACAINE IN DEXTROSE 0.75-8.25 % IT SOLN
INTRATHECAL | Status: DC | PRN
  Administered 2014-02-03: 9 mg via INTRATHECAL

## 2014-02-03 MED ORDER — KETOROLAC TROMETHAMINE 30 MG/ML IJ SOLN
30.0000 mg | Freq: Four times a day (QID) | INTRAMUSCULAR | Status: DC | PRN
  Administered 2014-02-04: 30 mg via INTRAVENOUS

## 2014-02-03 MED ORDER — LACTATED RINGERS IV SOLN: 500.0000 mL | INTRAVENOUS | Status: DC | PRN

## 2014-02-03 MED ORDER — ACETAMINOPHEN 325 MG PO TABS: 650.0000 mg | ORAL_TABLET | ORAL | Status: DC | PRN

## 2014-02-03 MED ORDER — SCOPOLAMINE 1 MG/3DAYS TD PT72
MEDICATED_PATCH | TRANSDERMAL | Status: AC
  Filled 2014-02-03: qty 1

## 2014-02-03 MED ORDER — PENICILLIN G POTASSIUM 5000000 UNITS IJ SOLR
2.5000 10*6.[IU] | INTRAMUSCULAR | Status: DC
  Filled 2014-02-03 (×2): qty 2.5

## 2014-02-03 MED ORDER — SCOPOLAMINE 1 MG/3DAYS TD PT72
1.0000 | MEDICATED_PATCH | Freq: Once | TRANSDERMAL | Status: DC
  Administered 2014-02-03: 1.5 mg via TRANSDERMAL

## 2014-02-03 MED ORDER — PHENYLEPHRINE HCL 10 MG/ML IJ SOLN
INTRAMUSCULAR | Status: DC | PRN
  Administered 2014-02-03: 80 ug via INTRAVENOUS

## 2014-02-03 MED ORDER — CITRIC ACID-SODIUM CITRATE 334-500 MG/5ML PO SOLN
30.0000 mL | ORAL | Status: DC | PRN
  Administered 2014-02-03: 30 mL via ORAL
  Filled 2014-02-03: qty 15

## 2014-02-03 MED ORDER — LIDOCAINE HCL (PF) 1 % IJ SOLN: 30.0000 mL | INTRAMUSCULAR | Status: DC | PRN

## 2014-02-03 SURGICAL SUPPLY — 43 items
BARRIER ADHS 3X4 INTERCEED (GAUZE/BANDAGES/DRESSINGS) ×4 IMPLANT
BENZOIN TINCTURE PRP APPL 2/3 (GAUZE/BANDAGES/DRESSINGS) IMPLANT
CLAMP CORD UMBIL (MISCELLANEOUS) ×2 IMPLANT
CLOTH BEACON ORANGE TIMEOUT ST (SAFETY) ×2 IMPLANT
CONTAINER PREFILL 10% NBF 15ML (MISCELLANEOUS) IMPLANT
DRAPE LG THREE QUARTER DISP (DRAPES) ×2 IMPLANT
DRSG OPSITE POSTOP 4X10 (GAUZE/BANDAGES/DRESSINGS) ×2 IMPLANT
DURAPREP 26ML APPLICATOR (WOUND CARE) ×2 IMPLANT
ELECT REM PT RETURN 9FT ADLT (ELECTROSURGICAL) ×2
ELECTRODE REM PT RTRN 9FT ADLT (ELECTROSURGICAL) ×1 IMPLANT
EXTRACTOR VACUUM M CUP 4 TUBE (SUCTIONS) IMPLANT
GLOVE BIOGEL PI IND STRL 7.0 (GLOVE) ×1 IMPLANT
GLOVE BIOGEL PI INDICATOR 7.0 (GLOVE) ×1
GLOVE ECLIPSE 6.5 STRL STRAW (GLOVE) ×2 IMPLANT
GOWN STRL REUS W/TWL LRG LVL3 (GOWN DISPOSABLE) ×4 IMPLANT
KIT ABG SYR 3ML LUER SLIP (SYRINGE) IMPLANT
NEEDLE HYPO 25X1 1.5 SAFETY (NEEDLE) ×2 IMPLANT
NEEDLE HYPO 25X5/8 SAFETYGLIDE (NEEDLE) IMPLANT
NS IRRIG 1000ML POUR BTL (IV SOLUTION) ×2 IMPLANT
PACK C SECTION WH (CUSTOM PROCEDURE TRAY) ×2 IMPLANT
PAD ABD 7.5X8 STRL (GAUZE/BANDAGES/DRESSINGS) ×2 IMPLANT
PAD OB MATERNITY 4.3X12.25 (PERSONAL CARE ITEMS) ×2 IMPLANT
RTRCTR C-SECT PINK 25CM LRG (MISCELLANEOUS) IMPLANT
SPONGE GAUZE 4X4 12PLY STER LF (GAUZE/BANDAGES/DRESSINGS) ×2 IMPLANT
STAPLER VISISTAT 35W (STAPLE) ×2 IMPLANT
STRIP CLOSURE SKIN 1/2X4 (GAUZE/BANDAGES/DRESSINGS) IMPLANT
SUT CHROMIC GUT AB #0 18 (SUTURE) IMPLANT
SUT MNCRL 0 VIOLET CTX 36 (SUTURE) ×3 IMPLANT
SUT MON AB 4-0 PS1 27 (SUTURE) IMPLANT
SUT MONOCRYL 0 CTX 36 (SUTURE) ×3
SUT PLAIN 2 0 (SUTURE)
SUT PLAIN 2 0 XLH (SUTURE) ×2 IMPLANT
SUT PLAIN ABS 2-0 CT1 27XMFL (SUTURE) IMPLANT
SUT VIC AB 0 CT1 27 (SUTURE) ×1
SUT VIC AB 0 CT1 27XBRD ANBCTR (SUTURE) ×1 IMPLANT
SUT VIC AB 0 CT1 36 (SUTURE) ×4 IMPLANT
SUT VIC AB 2-0 CT1 27 (SUTURE) ×1
SUT VIC AB 2-0 CT1 TAPERPNT 27 (SUTURE) ×1 IMPLANT
SUT VIC AB 4-0 PS2 27 (SUTURE) IMPLANT
SYR CONTROL 10ML LL (SYRINGE) ×2 IMPLANT
TOWEL OR 17X24 6PK STRL BLUE (TOWEL DISPOSABLE) ×2 IMPLANT
TRAY FOLEY CATH 14FR (SET/KITS/TRAYS/PACK) ×2 IMPLANT
WATER STERILE IRR 1000ML POUR (IV SOLUTION) IMPLANT

## 2014-02-03 NOTE — H&P (Signed)
Candice [name] is a 28 y.o. female presenting for admission due to PTL, breech presentation @ 37 1/7 weeks. (+) GBS. PNC complicated by shortened cervix since 26 weeks requiring inpt stay as well as vaginal progesterone suppository and procardia for PTL.  Pt was seen in office today for routine care where she was found to be 4/80/bulging membrane, breech. Pt was sent to MAU for monitoring and after walking was 4.5 cm /100/ bulging membrane lower in vagina. Pt was therefore informed of need for admission. Maternal Medical History:  Reason for admission: Contractions.   Contractions: Frequency: regular.    Fetal activity: Perceived fetal activity is normal.    Prenatal complications: Preterm cervical shortening, 2 vessel cord, early large SCH    OB History   Grav Para Term Preterm Abortions TAB SAB Ect Mult Living   1         0     Past Medical History  Diagnosis Date  . Short cervix in second trimester, antepartum 11/16/2013  . Migraines    Past Surgical History  Procedure Laterality Date  . Wisdom tooth extraction  2002   Family History: family history is not on file. Social History:  reports that she has never smoked. She has never used smokeless tobacco. She reports that she does not drink alcohol or use illicit drugs.   Prenatal Transfer Tool  Maternal Diabetes: No Genetic Screening: Normal Maternal Ultrasounds/Referrals: Abnormal:  Findings:   Other: Fetal Ultrasounds or other Referrals:  Fetal echo Maternal Substance Abuse:  No Significant Maternal Medications:  Meds include: Progesterone Other:  Significant Maternal Lab Results:  Lab values include: Group B Strep positive Other Comments:  2 vessel cord,   Review of Systems  All other systems reviewed and are negative.   Dilation: 5 Effacement (%): 80 Exam by:: Dr Cherly Hensen Blood pressure 135/87, pulse 101, temperature 97.7 F (36.5 C), temperature source Oral, resp. rate 18, height 5' 4.5" (1.638 m), weight  71.668 kg (158 lb), last menstrual period 05/19/2013. Maternal Exam:  Uterine Assessment: Contraction strength is moderate.  Abdomen: Patient reports no abdominal tenderness. Fetal presentation: breech  Introitus: Normal vulva.   Physical Exam  Constitutional: She is oriented to person, place, and time. She appears well-developed and well-nourished.  HENT:  Head: Atraumatic.  Eyes: EOM are normal.  Neck: Neck supple.  Cardiovascular: Normal rate and regular rhythm.   Respiratory: Breath sounds normal.  GI: Soft.  Musculoskeletal: She exhibits no edema.  Neurological: She is alert and oriented to person, place, and time.  Skin: Skin is warm and dry.  Psychiatric: She has a normal mood and affect.    Prenatal labs: ABO, Rh: O/Positive/-- (11/07 0000) Antibody: Negative (11/07 0000) Rubella: Immune (11/07 0000) RPR: Nonreactive (11/07 0000)  HBsAg: Negative (11/07 0000)  HIV: Non-reactive (11/07 0000)  GBS: Positive (11/17 0000)   Assessment/Plan: Active labor/advanced dilation Frank breech presentation GBS cx(+)  IUP@ 37 1/7 P) admit , NPO ,  Monitoring , IV PCN,  advancement of cervical dilation = Primary C/S, routine labs   Candice [name] 02/03/2014, 9:46 PM

## 2014-02-03 NOTE — MAU Note (Signed)
Was having some back pains last night.  Went to regular appt today. Was 4 cm, last wk was 1.  Was having contractions on the monitor- but pt was unaware.  Pt has been on bedrest/out of work since 25wks due to short cervix.  Is breech.

## 2014-02-03 NOTE — Progress Notes (Signed)
Dr. Cherly Hensen discussing risks and benefits of c/s with patient.

## 2014-02-03 NOTE — Consult Note (Signed)
Neonatology Note:  Attendance at C-section:  I was asked by Dr. Cousins to attend this primary C/S at 37 1/[redacted] weeks GA due to onset of labor and breech presentation. The mother is a G1P0 O pos, GBS pos with shortened cervix and known 2-VC. ROM at delivery, fluid clear. Mother got 1 dose of Pen G 2 hours prior to delivery. Infant vigorous with good spontaneous cry and tone. Needed only minimal bulb suctioning. Ap 9/9. Lungs clear to ausc in DR. To CN to care of Pediatrician.  Candice Reidinger C. Larene Ascencio, MD  

## 2014-02-03 NOTE — Transfer of Care (Signed)
Immediate Anesthesia Transfer of Care Note  Patient: Candice [name]  Procedure(s) Performed: Procedure(s): Primary Cesarean Section Delivery Baby Girl @ 2244, Apgars 9/9 (N/A)  Patient Location: PACU  Anesthesia Type:Spinal  Level of Consciousness: awake, alert  and oriented  Airway & Oxygen Therapy: Patient Spontanous Breathing  Post-op Assessment: Report given to PACU RN and Post -op Vital signs reviewed and stable  Post vital signs: Reviewed and stable  Complications: No apparent anesthesia complications

## 2014-02-03 NOTE — Anesthesia Preprocedure Evaluation (Addendum)
Anesthesia Evaluation  Patient identified by MRN, date of birth, ID band Patient awake    Reviewed: Allergy & Precautions, H&P , NPO status , Patient's Chart, lab work & pertinent test results  Airway Mallampati: I TM Distance: >3 FB Neck ROM: full    Dental no notable dental hx.    Pulmonary neg pulmonary ROS,    Pulmonary exam normal       Cardiovascular Exercise Tolerance: Good negative cardio ROS      Neuro/Psych negative psych ROS   GI/Hepatic negative GI ROS, Neg liver ROS,   Endo/Other  negative endocrine ROS  Renal/GU negative Renal ROS     Musculoskeletal   Abdominal Normal abdominal exam  (+)   Peds  Hematology negative hematology ROS (+)   Anesthesia Other Findings   Reproductive/Obstetrics (+) Pregnancy                          Anesthesia Physical Anesthesia Plan  ASA: II and emergent  Anesthesia Plan: Spinal   Post-op Pain Management:    Induction:   Airway Management Planned:   Additional Equipment:   Intra-op Plan:   Post-operative Plan:   Informed Consent: I have reviewed the patients History and Physical, chart, labs and discussed the procedure including the risks, benefits and alternatives for the proposed anesthesia with the patient or authorized representative who has indicated his/her understanding and acceptance.     Plan Discussed with: CRNA and Surgeon  Anesthesia Plan Comments:        Anesthesia Quick Evaluation

## 2014-02-03 NOTE — Anesthesia Procedure Notes (Signed)
Spinal  Patient location during procedure: OR Start time: 02/03/2014 10:15 PM End time: 02/03/2014 10:17 PM Staffing Anesthesiologist: Leilani Able Performed by: anesthesiologist  Preanesthetic Checklist Completed: patient identified, surgical consent, pre-op evaluation, timeout performed, IV checked, risks and benefits discussed and monitors and equipment checked Spinal Block Patient position: sitting Prep: DuraPrep Patient monitoring: heart rate, cardiac monitor, continuous pulse ox and blood pressure Approach: midline Location: L3-4 Injection technique: single-shot Needle Needle type: Pencan  Needle gauge: 24 G Needle length: 9 cm Needle insertion depth: 5 cm Assessment Sensory level: T6

## 2014-02-03 NOTE — Brief Op Note (Signed)
02/03/2014  11:38 PM  PATIENT:  Candice [name]  28 y.o. female  PRE-OPERATIVE DIAGNOSIS:  Active Labor, IUP @ 37 1/7 weeks, Homero Fellers breech presentation   POST-OPERATIVE DIAGNOSIS:  Homero Fellers breech, IUP @ 37 1/7 weeks, active labor  PROCEDURE:  Primary Cesarean section, Sharl Ma hysterotomy  SURGEON:  Surgeon(s) and Role:    * Uzziel Russey Cathie Beams, MD - Primary  PHYSICIAN ASSISTANT:   ASSISTANTS: Marlinda Mike, CNM   ANESTHESIA:   spinal FINDINGS; live female frank breech presentation, ant placenta bilobed, nl tubes and ovaries apgar 9/9 EBL:  Total I/O In: 1000 [I.V.:1000] Out: 650 [Urine:50; Blood:600]  BLOOD ADMINISTERED:none  DRAINS: none   LOCAL MEDICATIONS USED:  MARCAINE     SPECIMEN:  Source of Specimen:  placenta..bilobed  DISPOSITION OF SPECIMEN:  PATHOLOGY  COUNTS:  YES  TOURNIQUET:  * No tourniquets in log *  DICTATION: .Other Dictation: Dictation Number [id number]  PLAN OF CARE: Admit to inpatient   PATIENT DISPOSITION:  PACU - hemodynamically stable.   Delay start of Pharmacological VTE agent (>24hrs) due to surgical blood loss or risk of bleeding: no

## 2014-02-03 NOTE — MAU Note (Signed)
Spoke with Dr Cherly Hensen and pt is to walk for one hour to see if she makes cervical change.

## 2014-02-03 NOTE — Progress Notes (Signed)
S: c/o intermittent back pain  O; Tracing: cat 1 baseline 130 (+) accel Ctx q 2-4 mins VE: 5/100/ bulging membrane  IMP: active labor Frank breech P) disc need for C/S. Primary C/S reviewed. Procedure explained.risk of surgery including infection, bleeding , possible blood transfusion and its risk, internal scar tissue, injury to surrounding organ structure. Consent signed

## 2014-02-04 ENCOUNTER — Encounter (HOSPITAL_COMMUNITY): Payer: Self-pay | Admitting: *Deleted

## 2014-02-04 LAB — CBC
HEMATOCRIT: 29.4 % — AB (ref 36.0–46.0)
Hemoglobin: 9.4 g/dL — ABNORMAL LOW (ref 12.0–15.0)
MCH: 27.2 pg (ref 26.0–34.0)
MCHC: 32 g/dL (ref 30.0–36.0)
MCV: 85.2 fL (ref 78.0–100.0)
Platelets: 205 10*3/uL (ref 150–400)
RBC: 3.45 MIL/uL — ABNORMAL LOW (ref 3.87–5.11)
RDW: 13.3 % (ref 11.5–15.5)
WBC: 13.7 10*3/uL — ABNORMAL HIGH (ref 4.0–10.5)

## 2014-02-04 LAB — ABO/RH: ABO/RH(D): O POS

## 2014-02-04 LAB — RPR

## 2014-02-04 MED ORDER — LANOLIN HYDROUS EX OINT: 1.0000 "application " | TOPICAL_OINTMENT | CUTANEOUS | Status: DC | PRN

## 2014-02-04 MED ORDER — ZOLPIDEM TARTRATE 5 MG PO TABS: 5.0000 mg | ORAL_TABLET | Freq: Every evening | ORAL | Status: DC | PRN

## 2014-02-04 MED ORDER — MENTHOL 3 MG MT LOZG: 1.0000 | LOZENGE | OROMUCOSAL | Status: DC | PRN

## 2014-02-04 MED ORDER — SODIUM CHLORIDE 0.9 % IJ SOLN: 3.0000 mL | INTRAMUSCULAR | Status: DC | PRN

## 2014-02-04 MED ORDER — METHYLERGONOVINE MALEATE 0.2 MG PO TABS: 0.2000 mg | ORAL_TABLET | ORAL | Status: DC | PRN

## 2014-02-04 MED ORDER — ACETAMINOPHEN 500 MG PO TABS
1000.0000 mg | ORAL_TABLET | Freq: Four times a day (QID) | ORAL | Status: AC
  Administered 2014-02-04: 1000 mg via ORAL
  Filled 2014-02-04: qty 2

## 2014-02-04 MED ORDER — NALBUPHINE HCL 10 MG/ML IJ SOLN
5.0000 mg | INTRAMUSCULAR | Status: DC | PRN
  Administered 2014-02-05: 10 mg via SUBCUTANEOUS
  Filled 2014-02-04: qty 1

## 2014-02-04 MED ORDER — SIMETHICONE 80 MG PO CHEW
80.0000 mg | CHEWABLE_TABLET | ORAL | Status: DC
  Administered 2014-02-04 – 2014-02-06 (×2): 80 mg via ORAL
  Filled 2014-02-04 (×2): qty 1

## 2014-02-04 MED ORDER — ONDANSETRON HCL 4 MG/2ML IJ SOLN: 4.0000 mg | INTRAMUSCULAR | Status: DC | PRN

## 2014-02-04 MED ORDER — DIBUCAINE 1 % RE OINT: 1.0000 "application " | TOPICAL_OINTMENT | RECTAL | Status: DC | PRN

## 2014-02-04 MED ORDER — DIPHENHYDRAMINE HCL 25 MG PO CAPS: 25.0000 mg | ORAL_CAPSULE | ORAL | Status: DC | PRN

## 2014-02-04 MED ORDER — BISACODYL 10 MG RE SUPP: 10.0000 mg | Freq: Every day | RECTAL | Status: DC | PRN

## 2014-02-04 MED ORDER — SENNOSIDES-DOCUSATE SODIUM 8.6-50 MG PO TABS
2.0000 | ORAL_TABLET | ORAL | Status: DC
  Administered 2014-02-04 – 2014-02-06 (×2): 2 via ORAL
  Filled 2014-02-04 (×2): qty 2

## 2014-02-04 MED ORDER — PRENATAL MULTIVITAMIN CH
1.0000 | ORAL_TABLET | Freq: Every day | ORAL | Status: DC
  Administered 2014-02-04 – 2014-02-06 (×3): 1 via ORAL
  Filled 2014-02-04 (×3): qty 1

## 2014-02-04 MED ORDER — SIMETHICONE 80 MG PO CHEW: 80.0000 mg | CHEWABLE_TABLET | ORAL | Status: DC | PRN

## 2014-02-04 MED ORDER — FERROUS SULFATE 325 (65 FE) MG PO TABS
325.0000 mg | ORAL_TABLET | Freq: Two times a day (BID) | ORAL | Status: DC
  Administered 2014-02-04 – 2014-02-06 (×4): 325 mg via ORAL
  Filled 2014-02-04 (×5): qty 1

## 2014-02-04 MED ORDER — ONDANSETRON HCL 4 MG PO TABS: 4.0000 mg | ORAL_TABLET | ORAL | Status: DC | PRN

## 2014-02-04 MED ORDER — DEXTROSE IN LACTATED RINGERS 5 % IV SOLN
INTRAVENOUS | Status: DC
  Administered 2014-02-04: 02:00:00 via INTRAVENOUS

## 2014-02-04 MED ORDER — NALBUPHINE HCL 10 MG/ML IJ SOLN
INTRAMUSCULAR | Status: AC
  Administered 2014-02-05: 10 mg via SUBCUTANEOUS
  Filled 2014-02-04: qty 1

## 2014-02-04 MED ORDER — SODIUM CHLORIDE 0.9 % IJ SOLN: 3.0000 mL | Freq: Two times a day (BID) | INTRAMUSCULAR | Status: DC

## 2014-02-04 MED ORDER — OXYTOCIN 40 UNITS IN LACTATED RINGERS INFUSION - SIMPLE MED: 62.5000 mL/h | INTRAVENOUS | Status: AC

## 2014-02-04 MED ORDER — DIPHENHYDRAMINE HCL 50 MG/ML IJ SOLN: 25.0000 mg | INTRAMUSCULAR | Status: DC | PRN

## 2014-02-04 MED ORDER — FENTANYL CITRATE 0.05 MG/ML IJ SOLN
INTRAMUSCULAR | Status: AC
  Administered 2014-02-04: 50 ug via INTRAVENOUS
  Filled 2014-02-04: qty 2

## 2014-02-04 MED ORDER — METHYLERGONOVINE MALEATE 0.2 MG/ML IJ SOLN: 0.2000 mg | INTRAMUSCULAR | Status: DC | PRN

## 2014-02-04 MED ORDER — DEXTROSE 5 % IV SOLN
1.0000 ug/kg/h | INTRAVENOUS | Status: DC | PRN
  Filled 2014-02-04: qty 2

## 2014-02-04 MED ORDER — IBUPROFEN 600 MG PO TABS: 600.0000 mg | ORAL_TABLET | Freq: Four times a day (QID) | ORAL | Status: DC | PRN

## 2014-02-04 MED ORDER — WITCH HAZEL-GLYCERIN EX PADS: 1.0000 "application " | MEDICATED_PAD | CUTANEOUS | Status: DC | PRN

## 2014-02-04 MED ORDER — METOCLOPRAMIDE HCL 5 MG/ML IJ SOLN: 10.0000 mg | Freq: Three times a day (TID) | INTRAMUSCULAR | Status: DC | PRN

## 2014-02-04 MED ORDER — NALOXONE HCL 0.4 MG/ML IJ SOLN: 0.4000 mg | INTRAMUSCULAR | Status: DC | PRN

## 2014-02-04 MED ORDER — NALBUPHINE HCL 10 MG/ML IJ SOLN: 5.0000 mg | INTRAMUSCULAR | Status: DC | PRN

## 2014-02-04 MED ORDER — IBUPROFEN 600 MG PO TABS
600.0000 mg | ORAL_TABLET | Freq: Four times a day (QID) | ORAL | Status: DC
  Administered 2014-02-04 – 2014-02-06 (×10): 600 mg via ORAL
  Filled 2014-02-04 (×10): qty 1

## 2014-02-04 MED ORDER — ONDANSETRON HCL 4 MG/2ML IJ SOLN: 4.0000 mg | Freq: Three times a day (TID) | INTRAMUSCULAR | Status: DC | PRN

## 2014-02-04 MED ORDER — DIPHENHYDRAMINE HCL 25 MG PO CAPS: 25.0000 mg | ORAL_CAPSULE | Freq: Four times a day (QID) | ORAL | Status: DC | PRN

## 2014-02-04 MED ORDER — KETOROLAC TROMETHAMINE 30 MG/ML IJ SOLN
INTRAMUSCULAR | Status: AC
  Filled 2014-02-04: qty 1

## 2014-02-04 MED ORDER — FLEET ENEMA 7-19 GM/118ML RE ENEM: 1.0000 | ENEMA | Freq: Every day | RECTAL | Status: DC | PRN

## 2014-02-04 MED ORDER — SIMETHICONE 80 MG PO CHEW
80.0000 mg | CHEWABLE_TABLET | Freq: Three times a day (TID) | ORAL | Status: DC
  Administered 2014-02-04 – 2014-02-06 (×5): 80 mg via ORAL
  Filled 2014-02-04 (×5): qty 1

## 2014-02-04 MED ORDER — SODIUM CHLORIDE 0.9 % IV SOLN: 250.0000 mL | INTRAVENOUS | Status: DC

## 2014-02-04 MED ORDER — DIPHENHYDRAMINE HCL 50 MG/ML IJ SOLN: 12.5000 mg | INTRAMUSCULAR | Status: DC | PRN

## 2014-02-04 MED ORDER — OXYCODONE-ACETAMINOPHEN 5-325 MG PO TABS
1.0000 | ORAL_TABLET | ORAL | Status: DC | PRN
  Administered 2014-02-04 – 2014-02-06 (×7): 2 via ORAL
  Filled 2014-02-04 (×7): qty 2

## 2014-02-04 NOTE — Addendum Note (Signed)
Addendum created 02/04/14 1157 by Lincoln Brigham, CRNA   Modules edited: Notes Section   Notes Section:  File: [id number]

## 2014-02-04 NOTE — Progress Notes (Signed)
Patient ID: Candice [name], female   DOB: 03-01-1986, 28 y.o.   MRN: [id number] Subjective: POD# 1 Information for the patient's newborn:  Candice, [name] Candice [name] [[id number]]  female   Reports feeling tired and sore Feeding: breast Patient reports tolerating PO.  Breast symptoms: none Pain controlled with ibuprofen (OTC) and narcotic analgesics including Percocet Denies HA/SOB/C/P/N/V/dizziness. Flatus none. She reports vaginal bleeding as normal, without clots.  She is ambulating, urinating without difficult.     Objective:   VS:  Filed Vitals:   02/04/14 0430 02/04/14 0545 02/04/14 0800 02/04/14 1030  BP:  95/50 92/48 90/45   Pulse: 78 88 86 90  Temp:  98.1 F (36.7 C) 98.6 F (37 C) 98.5 F (36.9 C)  TempSrc:   Oral Oral  Resp: 20 20 20 18   Height:      Weight:      SpO2: 97% 95% 96% 96%     Intake/Output Summary (Last 24 hours) at 02/04/14 1056 Last data filed at 02/04/14 0545  Gross per 24 hour  Intake   3665 ml  Output   1250 ml  Net   2415 ml        Recent Labs  02/03/14 1900 02/04/14 0640  WBC 14.5* 13.7*  HGB 10.8* 9.4*  HCT 33.7* 29.4*  PLT 245 205     Blood type: --/--/O POS (05/29 1900)  Rubella: Immune (11/07 0000)     Physical Exam:  General: alert, cooperative, fatigued and no distress CV: Regular rate and rhythm, S1S2 present or without murmur or extra heart sounds Resp: clear Abdomen: soft, nontender, normal bowel sounds Incision: Pressure dressing clean, dry and intact Uterine Fundus: firm, 1 FB below umbilicus, nontender Lochia: minimal Ext: extremities normal, atraumatic, no cyanosis or edema, Homans sign is negative, no sign of DVT and no edema, redness or tenderness in the calves or thighs      Assessment/Plan: 28 y.o.   POD# 1.  s/p Primary Cesarean Delivery.  Indications: breech presentation and active labor             Postoperative ABL Anemia  Doing well, stable.               Regular diet as tolerated D/C  foley Ambulate Encourage po fluids Protein and high fiber diet Routine post-op care  Candice Gower, MSN, CNM 02/04/2014, 10:56 AM

## 2014-02-04 NOTE — Anesthesia Postprocedure Evaluation (Signed)
Anesthesia Post Note  Patient: Candice [name]  Procedure(s) Performed: Procedure(s) (LRB): Primary Cesarean Section Delivery Baby Girl @ 2244, Apgars 9/9 (N/A)  Anesthesia type: Spinal  Patient location: PACU  Post pain: Pain level controlled  Post assessment: Post-op Vital signs reviewed  Last Vitals:  Filed Vitals:   02/03/14 2338  BP: 126/72  Pulse: 103  Temp: 36.8 C  Resp: 16    Post vital signs: Reviewed  Level of consciousness: awake  Complications: No apparent anesthesia complications

## 2014-02-04 NOTE — Lactation Note (Signed)
This note was copied from the chart of Candice Bevelyn Ngo. Lactation Consultation Note; Initial visit with mom. Reports that baby has had several good feedings but her nipples are pink- slight crack noted on right nipple and bruising noted on left areola.Reviewed wide open mouth and getting the baby deep onto the breast. Mom sleepy and baby asleep in bassinet. Encouraged to page for assist at next feeding. BF brochure given with resources for support after DC. No questions at present. Mom is [employer] employee and asking about getting pump from [place]- Questions answered.   Patient Name: Candice [name] [id number] Date: 02/04/2014 Reason for consult: Initial assessment   Maternal Data Formula Feeding for Exclusion: No Infant to breast within first hour of birth: Yes Does the patient have breastfeeding experience prior to this delivery?: No  Feeding   LATCH Score/Interventions          Comfort (Breast/Nipple): Filling, red/small blisters or bruises, mild/mod discomfort  Problem noted: Mild/Moderate discomfort        Lactation Tools Discussed/Used     Consult Status Consult Status: Follow-up Date: 02/05/14 Follow-up type: In-patient    Pamelia Hoit 02/04/2014, 1:27 PM

## 2014-02-04 NOTE — Anesthesia Postprocedure Evaluation (Signed)
  Anesthesia Post-op Note  Patient: Candice [name]  Procedure(s) Performed: Procedure(s): Primary Cesarean Section Delivery Baby Girl @ 2244, Apgars 9/9 (N/A)  Patient Location: Mother/Baby  Anesthesia Type:Spinal  Level of Consciousness: awake, alert  and oriented  Airway and Oxygen Therapy: Patient Spontanous Breathing  Post-op Pain: mild  Post-op Assessment: Post-op Vital signs reviewed, Patient's Cardiovascular Status Stable, Respiratory Function Stable, Patent Airway, No signs of Nausea or vomiting, Pain level controlled, No headache, No backache, No residual numbness and No residual motor weakness  Post-op Vital Signs: stable  Last Vitals:  Filed Vitals:   02/04/14 0545  BP: 95/50  Pulse: 88  Temp: 36.7 C  Resp: 20    Complications: No apparent anesthesia complications

## 2014-02-04 NOTE — Op Note (Signed)
Candice [name], Candice [name]             ACCOUNT NO.:  [id number]  MEDICAL RECORD NO.:  [id number]  LOCATION:  9123                          FACILITY:  [facility]  PHYSICIAN:  Maxie Better, M.D.DATE OF BIRTH:  19-Aug-1986  DATE OF PROCEDURE:  02/03/2014 DATE OF DISCHARGE:                              OPERATIVE REPORT   PREOPERATIVE DIAGNOSES:  Active labor, frank breech presentation, intrauterine gestation at 3 and 1/7th weeks.  PROCEDURE:  Primary cesarean section, Kerr hysterotomy.  POSTOPERATIVE DIAGNOSES:  Active labor, frank breech presentation, intrauterine gestation at 55 and 1/7th weeks.  ANESTHESIA:  Spinal.  SURGEON:  Maxie Better, MD  ASSISTANT:  Marlinda Mike, CNM  DESCRIPTION OF PROCEDURE:  Under adequate spinal anesthesia, the patient was placed in the supine position with left lateral tilt.  She was sterilely prepped and draped in usual fashion.  An indwelling Foley catheter was sterilely placed.  A 0.25% Marcaine was injected along the planned Pfannenstiel skin incision site.  Pfannenstiel skin incision was then made, carried down to the rectus fascia.  The rectus fascia opened transversely.  The rectus fascia was then bluntly and sharply dissected off the rectus muscle in superior-inferior fashion.  The rectus muscle was split in midline.  The parietal peritoneum was entered sharply.  The vesicouterine peritoneum was then opened transversely.  The bladder was then bluntly dissected off the lower uterine segment. The bladder was then displaced with a bladder retractor.  A curvilinear low transverse uterine incision was then made and extended with bandage scissors.  Artificial rupture of membranes occurred.  Clear amniotic fluid noted.  Breech position was encountered.  Subsequently   using breech maneuvers, a live female was delivered.  Baby was bulb suctioned and cord was clamped and cut.  The baby was transferred to the awaiting pediatrician who assigned  Apgars of 9 and 9 at 1 and 5 minutes.  The placenta which was anterior was manually removed.  It was bilobed and was sent to Pathology.  Uterine cavity was cleaned of debris.  Uterine incision had no extension, was closed in 2 layers, the first layer with 0 Monocryl running locked stitch, second layer was imbricated using 0 Monocryl suture.  Normal tubes and ovaries were noted bilaterally.  No intrauterine anomaly noted.  Abdomen was copiously irrigated and suctioned of debris.  Paracolic gutter was cleaned of debris.  Overlying the uterine incision, Interceed was placed in a transverse and vertical fashion.  The parietal peritoneum was closed with 2-0 Vicryl, the rectus fascia was closed with 0 Vicryl x2. The subcutaneous area was irrigated, small bleeders cauterized. Interrupted 2-0 plain sutures placed and the skin approximated with Ethicon staples.  SPECIMEN:  Placenta sent to Pathology.  ESTIMATED BLOOD LOSS:  600 mL.  INTRAOPERATIVE FLUID:  1000 mL.  URINE OUTPUT:  50 mL of concentrated urine.  COUNTS:  Sponge and instrument counts x2 was correct.  COMPLICATION:  None.  The patient tolerated the procedure well, was transferred to recovery room in stable condition.  The baby was placed on skin to skin     Maxie Better, M.D.     [place]/[facility]  D:  02/03/2014  T:  02/04/2014  Job:  [id number]

## 2014-02-05 NOTE — Progress Notes (Signed)
Patient ID: Candice [name], female   DOB: [birth year], 28 y.o.   MRN: [id number] Subjective: POD# 2 Information for the patient's newborn:  Candice [name], Candice [name] [[id number]]  female    Reports feeling better Feeding: breast Patient reports tolerating PO.  Breast symptoms: few cracks and bruising to nipples , LC following up today Pain controlled with ibuprofen (OTC) and narcotic analgesics including Percocet Denies HA/SOB/C/P/N/V/dizziness. Flatus present. She reports vaginal bleeding as normal, without clots.  She is ambulating, urinating without difficult.     Objective:   VS:  Filed Vitals:   02/04/14 1410 02/04/14 1757 02/04/14 2226 02/05/14 0540  BP: 102/58 103/62 104/62 105/57  Pulse: 70 78 85 77  Temp: 97.9 F (36.6 C) 97.9 F (36.6 C) 98.2 F (36.8 C) 98.2 F (36.8 C)  TempSrc: Oral Oral Oral Oral  Resp: 18 18 18 18   Height:      Weight:      SpO2: 96%  97% 97%      Intake/Output Summary (Last 24 hours) at 02/05/14 1470 Last data filed at 02/04/14 2319  Gross per 24 hour  Intake      0 ml  Output   2150 ml  Net  -2150 ml         Recent Labs  02/03/14 1900 02/04/14 0640  WBC 14.5* 13.7*  HGB 10.8* 9.4*  HCT 33.7* 29.4*  PLT 245 205     Blood type: --/--/O POS (05/29 1900)  Rubella: Immune (11/07 0000)     Physical Exam:  General: alert, cooperative, fatigued and no distress CV: Regular rate and rhythm, S1S2 present or without murmur or extra heart sounds Resp: clear Abdomen: soft, nontender, normal bowel sounds Incision: Pressure dressing clean, dry and intact Uterine Fundus: firm, even with umbilicus, nontender - few hours since used BR Lochia: minimal Ext: extremities normal, atraumatic, no cyanosis or edema, Homans sign is negative, no sign of DVT and no edema, redness or tenderness in the calves or thighs      Assessment/Plan: 28 y.o.   POD# 2.  s/p Primary Cesarean Delivery.  Indications: breech presentation and active labor              Postoperative ABL Anemia  Doing well, stable.               Regular diet as tolerated Increase Ambulate Encourage po fluids Protein and high fiber diet Routine post-op care Anticipate discharge home tomorrow  Candice Gower, MSN, CNM 02/05/2014, 7:05 AM

## 2014-02-06 ENCOUNTER — Encounter (HOSPITAL_COMMUNITY): Payer: Self-pay | Admitting: Obstetrics and Gynecology

## 2014-02-06 MED ORDER — OXYCODONE-ACETAMINOPHEN 5-325 MG PO TABS: 1.0000 | ORAL_TABLET | ORAL | Status: DC | PRN

## 2014-02-06 MED ORDER — FERROUS SULFATE 325 (65 FE) MG PO TABS: 325.0000 mg | ORAL_TABLET | Freq: Two times a day (BID) | ORAL | Status: DC

## 2014-02-06 MED ORDER — IBUPROFEN 600 MG PO TABS: 600.0000 mg | ORAL_TABLET | Freq: Four times a day (QID) | ORAL | Status: DC

## 2014-02-06 NOTE — Progress Notes (Signed)
POD # 3  Subjective: Pt reports feeling well, some sharp pain on right of incision/ Pain controlled with Motrin and Percocet Tolerating po/Voiding without problems/ No n/v/ Flatus present Activity: ad lib Bleeding is light Newborn info:  Information for the patient's newborn:  Lillyrose, [place] Girl Azarea [[id number]]  female Feeding: breast   Objective: VS: VS:  Filed Vitals:   02/04/14 2226 02/05/14 0540 02/05/14 1659 02/06/14 0705  BP: 104/62 105/57 111/61 117/68  Pulse: 85 77 79 74  Temp: 98.2 F (36.8 C) 98.2 F (36.8 C) 98.2 F (36.8 C) 98.4 F (36.9 C)  TempSrc: Oral Oral Oral Oral  Resp: 18 18 18 18   Height:      Weight:      SpO2: 97% 97%      I&O: Intake/Output     05/31 0701 - 06/01 0700 06/01 0701 - 06/02 0700   Urine (mL/kg/hr)     Total Output       Net              LABS:  Recent Labs  02/03/14 1900 02/04/14 0640  WBC 14.5* 13.7*  HGB 10.8* 9.4*  PLT 245 205                           Physical Exam:  General: alert and cooperative CV: Regular rate and rhythm Resp: CTA bilaterally Abdomen: soft, nontender, normal bowel sounds Incision: healing well, no drainage, no erythema, no hernia, no seroma, no swelling, well approximated with staples, honeycomb dsg c/d/i Uterine Fundus: firm, below umbilicus, nontender Lochia: minimal Ext: extremities normal, atraumatic, no cyanosis or edema and Homans sign is negative, no sign of DVT    Assessment: POD # 3/ G1P1001/ S/P C/Section d/t breech and labor IDA with compounding ABL anemia Doing well and stable for discharge home  Plan: Discharge home RX's: Ibuprofen 600mg  po Q 6 hrs prn pain #30 Refill x 1 Percocet 5/325 1 - 2 tabs po every 4 hrs prn pain #30 Refill x 0 Niferex 150mg  po BID #60 Refill x 1 [facility] booklet given    Signed: Lawernce Pitts, MSN, CNM 02/06/2014, 10:33 AM

## 2014-02-06 NOTE — Discharge Summary (Signed)
POSTOPERATIVE DISCHARGE SUMMARY:  Patient ID: Candice [name] MRN: [id number] DOB/AGE: February 28, 1986 28 y.o.  Admit date: 02/03/2014 Admission Diagnoses: 37.[redacted] weeks gestation, breech presentation in labor   Discharge date: 02/06/2014 Discharge Diagnoses: S/P Primary C/S on 02/03/14        Prenatal history: G1P1001   EDC : 02/23/2014, by Last Menstrual Period  Has received prenatal care at [facility] since [redacted] wks gestation. Primary provider : Raelyn Mora, CNM Prenatal course complicated by shortened cervix @26  wks requiring inpt admission; on vaginal progesterone and procardia, +GBS  Prenatal labs: ABO, Rh: --/--/O POS (05/29 1900)  Antibody: Negative (11/07 0000) Rubella:   Immune RPR: NON REAC (05/29 1900)  HBsAg: Negative (11/07 0000)  HIV: Non-reactive (11/07 0000)  GBS: Positive (11/17 0000)  GTT: 123  Medical / Surgical History :  Past medical history:  Past Medical History  Diagnosis Date  . Short cervix in second trimester, antepartum 11/16/2013  . Migraines   . Postpartum care following cesarean delivery (5/29) 02/04/2014    Past surgical history:  Past Surgical History  Procedure Laterality Date  . Wisdom tooth extraction  2002     Medications on Admission: Prescriptions prior to admission  Medication Sig Dispense Refill  . acetaminophen (TYLENOL) 500 MG tablet Take 1,000 mg by mouth every 6 (six) hours as needed for pain.      [name] docusate sodium (COLACE) 100 MG capsule Take 100 mg by mouth 2 (two) times daily.      [name] NIFEdipine (PROCARDIA) 10 MG capsule Take 1 capsule (10 mg total) by mouth every 6 (six) hours.  120 capsule  2  . pantoprazole (PROTONIX) 20 MG tablet Take 20 mg by mouth daily.      . polyethylene glycol (MIRALAX / GLYCOLAX) packet Take 17 g by mouth daily.      . Prenatal Vit-Fe Fumarate-FA (PRENATAL MULTIVITAMIN) TABS tablet Take 1 tablet by mouth daily at 12 noon.      . progesterone (PROMETRIUM) 200 MG capsule Place 1  capsule (200 mg total) vaginally daily.  30 capsule  2  . butalbital-acetaminophen-caffeine (FIORICET, ESGIC) 50-325-40 MG per tablet Take 1 tablet by mouth daily as needed for headache or migraine.      . ondansetron (ZOFRAN) 4 MG tablet Take 4 mg by mouth every 8 (eight) hours as needed for nausea.      . pramoxine-mineral oil-zinc (TUCKS) 1-12.5 % rectal ointment Place 1 application rectally every 2 (two) hours as needed for itching.        Allergies: Sulfur   Intrapartum Course:  Admited for active labor, breech presentation, primary CS under spinal  Postpartum Course: Complicated by IDA with compounding ABL anemia  Physical Exam:   VSS: Blood pressure 117/68, pulse 74, temperature 98.4 F (36.9 C), temperature source Oral, resp. rate 18, height 5' 4.5" (1.638 m), weight 71.668 kg (158 lb), last menstrual period 05/19/2013, SpO2 97.00%, unknown if currently breastfeeding.  LABS:  Recent Labs  02/03/14 1900 02/04/14 0640  WBC 14.5* 13.7*  HGB 10.8* 9.4*  PLT 245 205    Newborn Data Live born female  Birth Weight: 6 lb 8.9 oz (2974 g) APGAR: 9, 9  See operative report for further details  Home with mother.  Discharge Instructions:  Wound Care: keep clean and dry; dressing and staple removal in office on day 7  Postpartum Instructions: [facility] discharge booklet - instructions reviewed Medications:    Medication List    STOP taking these medications  acetaminophen 500 MG tablet  Commonly known as:  TYLENOL     butalbital-acetaminophen-caffeine 50-325-40 MG per tablet  Commonly known as:  FIORICET, ESGIC     NIFEdipine 10 MG capsule  Commonly known as:  PROCARDIA     ondansetron 4 MG tablet  Commonly known as:  ZOFRAN     pantoprazole 20 MG tablet  Commonly known as:  PROTONIX     polyethylene glycol packet  Commonly known as:  MIRALAX / GLYCOLAX     pramoxine-mineral oil-zinc 1-12.5 % rectal ointment  Commonly known as:  TUCKS      progesterone 200 MG capsule  Commonly known as:  PROMETRIUM      TAKE these medications       docusate sodium 100 MG capsule  Commonly known as:  COLACE  Take 100 mg by mouth 2 (two) times daily.     ferrous sulfate 325 (65 FE) MG tablet  Take 1 tablet (325 mg total) by mouth 2 (two) times daily with a meal.     ibuprofen 600 MG tablet  Commonly known as:  ADVIL,MOTRIN  Take 1 tablet (600 mg total) by mouth every 6 (six) hours.     oxyCODONE-acetaminophen 5-325 MG per tablet  Commonly known as:  PERCOCET/ROXICET  Take 1-2 tablets by mouth every 4 (four) hours as needed for severe pain (moderate - severe pain).     prenatal multivitamin Tabs tablet  Take 1 tablet by mouth daily at 12 noon.            Follow-up Information   Follow up with Raelyn MoraAWSON, ROLITTA, M, CNM. Schedule an appointment as soon as possible for a visit in 6 weeks. (and this Friday for staple removal)    Specialty:  Obstetrics and Gynecology   Contact information:   [address] [place] [id number] [phone]         Signed: Lawernce PittsMelanie N Julyssa [name] WHNP-BC, MSN 02/06/2014, 11:11 AM

## 2014-02-06 NOTE — Plan of Care (Signed)
Problem: Discharge Progression Outcomes Goal: Remove staples per MD order Outcome: Not Applicable Date Met:  72/09/19 Staples to be removed in office on friday

## 2014-02-06 NOTE — Lactation Note (Signed)
This note was copied from the chart of Candice Bevelyn Ngo. Lactation Consultation Note    Follow up consult with this mom of a early term baby, now 37 4/7 weeks CGA, weighing 6 pounds 1.5 ounces on discharge today. (8% weight loss). Mom has been breast feeding with 20 nipple shield. i increased her to 24 with a good fit, and did a pre and post weight with baby. She transferred 10 mls at the breast, and was supplemented with 15 mls EBM. Discharge plan for mom written up, and mom to get a DEP today([employer] employee). Mom is going to attempt feeding at least every 3 hours, and on cue, breast feed with nipple shield, and pump after each feeding, for at least 15 minutes,  and supplement  Baby with EBM according to the [facility] amounts chart. Mom has an appointment with lactation for 6/4 at 4 pm. Mom reports being good with this plan.  Patient Name: Candice [name] HWEXH'B Date: 02/06/2014 Reason for consult: Follow-up assessment;Other (Comment) (371/7 CGA and 6 pounds 0.5 oz)   Maternal Data    Feeding Feeding Type: Breast Fed Length of feed: 27 min  LATCH Score/Interventions Latch: Repeated attempts needed to sustain latch, nipple held in mouth throughout feeding, stimulation needed to elicit sucking reflex. (EBM placed in 24 nipple shiled) Intervention(s): Skin to skin;Teach feeding cues Intervention(s): Adjust position;Assist with latch;Breast massage  Audible Swallowing: Spontaneous and intermittent  Type of Nipple: Everted at rest and after stimulation  Comfort (Breast/Nipple): Filling, red/small blisters or bruises, mild/mod discomfort  Problem noted: Cracked, bleeding, blisters, bruises;Mild/Moderate discomfort Interventions  (Cracked/bleeding/bruising/blister): Expressed breast milk to nipple Interventions (Mild/moderate discomfort): Hand expression;Comfort gels  Hold (Positioning): Assistance needed to correctly position infant at breast and maintain latch. Intervention(s):  Breastfeeding basics reviewed;Support Pillows;Position options;Skin to skin  LATCH Score: 7  Lactation Tools Discussed/Used     Consult Status Consult Status: Complete Follow-up type: Call as needed    Alfred Levins 02/06/2014, 10:07 AM

## 2014-02-07 ENCOUNTER — Encounter (HOSPITAL_COMMUNITY): Payer: Self-pay | Admitting: Obstetrics and Gynecology

## 2014-02-07 NOTE — Addendum Note (Signed)
Addendum created 02/07/14 1201 by Leilani Able, MD   Modules edited: Anesthesia Events, Anesthesia Responsible Staff

## 2014-02-09 ENCOUNTER — Ambulatory Visit (HOSPITAL_COMMUNITY)
Admit: 2014-02-09 | Discharge: 2014-02-09 | Disposition: A | Discharge status: Home / Self Care | Payer: 59 | Attending: Pediatrics | Admitting: Pediatrics

## 2014-02-09 NOTE — Lactation Note (Signed)
Adult Lactation Consultation Outpatient Visit Note  Patient Name: Candice [name]                                          "[name]" Date of Birth: 04-08-1986                                                             6 days Gestational Age at Delivery: Unknown                                       Weight today: [phone] Type of Delivery: C-Section  Breastfeeding History: Frequency of Breastfeeding: every 2-3 hours Length of Feeding: 15 mins on each breast. Voids: 9 Stools: 8 yellow seedy  Supplementing / Method: No supplementing since two days Pumping:  Type of Pump:Medela PIS   Frequency:none since 2 days , Mother exclusively breastfeeding and is not supplementing  Volume:    Comments:  Infant was born at  49 1/[redacted] weeks gestation.  Mother had a difficult latch while in the hospital . She became sore and was fit with a #24 nipple shield. She states she is only using the nipple shield on the (R) breast. (R) nipple still has a small crack. (L) nipple has a tiny little scab. Mother states that initial  latch is a pain scale of #10 , after 30 seconds she states it feels better with no pain. Mother milk came in on June 1. She states she became engorged and it was quickly resolved.     Consultation Evaluation Mothers breast are firm and full. I failed to weight Infant before first breast. Assist mother with hand expressing into a bottle. Mothers areola tissue was very firm. Assist mother with latching infant on (R) breast without the nipple shield. Mother taught nipple to nose technique. Mother states she felt no pain with the latch. Observed good depth . Infant was observed with audible swallows and frequent burst of suckling. Mother was taught breast compression. Mothers breast softened well . Infant sustained latch for 15 mins.  Mother hand expressed again before feeding on (L) breast. Infant sustained latch for 15 mins . She transferred 24 ml  Additional Feeding Assessment: Pre-feed  [id number] Post-feed [id number] Amount Transferred:24 ml Comments:  Total Breast milk Transferred this Visit: 24 ml plus Total Supplement Given:   Additional Interventions:  Advised mother to continue to cue base feed and at least every 3 hrs Mother to hand express before feeding to soften areola for better less painful latch Mother to use nipple to nose technique to latch infant with wide gape Advised mother to continue to post pump at least 1-2 times daily for 20 mins Suggest that mother nap and drink to thirst Recommend that mother phone for [facility] Reviewed S/S of Mastitis Follow up with [facility] in one week   Follow-Up June 11 at 2:30     [facility] 02/09/2014, 5:48 PM

## 2014-02-16 ENCOUNTER — Ambulatory Visit (HOSPITAL_COMMUNITY)
Admission: RE | Admit: 2014-02-16 | Discharge: 2014-02-16 | Disposition: A | Discharge status: Home / Self Care | Payer: 59 | Source: Ambulatory Visit | Attending: Obstetrics and Gynecology | Admitting: Obstetrics and Gynecology

## 2014-02-16 NOTE — Lactation Note (Signed)
Adult Lactation Consultation Outpatient Visit Note' Follow up visit after last Cloris Flippo appointment.  Patient Name: Candice [name] Date of Birth: 18-Nov-1985 Gestational Age at Delivery: Unknown Type of Delivery:   Breastfeeding History: Frequency of Breastfeeding: q 2-3 Length of Feeding: 30 Voids: 6 Stools: 1 large last night, 2 smears today  Supplementing / Method: Pumping:  Type of Pump: Medela PIS   Frequency: 2-3 times/day  Volume:  2-3 oz  Comments:    Consultation Evaluation:  Initial Feeding Assessment: Pre-feed Weight: 6- 10.1  3008g Post-feed Weight: 6- 11.9  3058g Amount Transferred:50 Comments: Amelia latched well and nursed for 15 minutes. Mom reports that breasts are not as full as last week. Mom's nipples are getting better- using APNO on them.   Additional Feeding Assessment: Pre-feed Weight: 6- 11.9  3058g Post-feed Weight: 6- 12.9  3088g Amount Transferred:30 Comments: Amelia latched well and nursed for 10 min then off to sleep. Nipple slightly pinched when she came off. Encouraged her to keep baby close to the breast throughout the feeding.   Baby has spit up bright yellow emesis twice- to see Ped today about that  Total Breast milk Transferred this Visit: 80 cc's Total Supplement Given: 0  Additional Interventions:   Follow-Up  With Ped BFS[facility] for support and weight check To call prn    Pamelia Hoit 02/16/2014, 3:24 PM

## 2014-07-10 ENCOUNTER — Encounter (HOSPITAL_COMMUNITY): Payer: Self-pay | Admitting: Obstetrics and Gynecology

## 2014-12-04 ENCOUNTER — Ambulatory Visit: Payer: Self-pay | Admitting: General Practice

## 2015-02-19 ENCOUNTER — Encounter: Payer: Self-pay | Admitting: Psychiatry

## 2015-02-19 ENCOUNTER — Other Ambulatory Visit: Payer: Self-pay

## 2015-02-19 ENCOUNTER — Ambulatory Visit (INDEPENDENT_AMBULATORY_CARE_PROVIDER_SITE_OTHER): Payer: 59 | Admitting: Psychiatry

## 2015-02-19 VITALS — BP 138/84 | HR 85 | Temp 98.3°F | Ht 64.0 in

## 2015-02-19 DIAGNOSIS — F331 Major depressive disorder, recurrent, moderate: Secondary | ICD-10-CM | POA: Insufficient documentation

## 2015-02-19 MED ORDER — ESCITALOPRAM OXALATE 10 MG PO TABS: 15.0000 mg | ORAL_TABLET | Freq: Every day | ORAL | Status: DC

## 2015-02-19 NOTE — Progress Notes (Signed)
[facility] MD/PA/NP OP Progress Note  02/19/2015 11:04 AM Candice [name]  MRN:  [id number]  Subjective:  Patient returns for follow-up of her major depressive disorder. She states that overall things are going well. She states she does feel better on the Lexapro but wonders whether increased dose might help her feel better. We discussed how improvement on antidepressants can depend on how she subjectively feels. She indicated that she felt like she is unable to characterize exactly that she is now having a certain number of days feeling depressed or how long it's occurring within 1 day. However she was able to state that she felt she was having more good days with a good mood and she was having bad days with a bad mood.  She denied any side effects with stated as she had the last visit that at one point her husband had said she looked spacey. Patient states she has not had any incidence of that since then and she attributes that particular occasion to being tired from the day.  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 She states her hours continue to be 6:49 PM. She states she continues to take care of her baby. She related that none of her relatives have commented on her having any problems with her mood, however she clarifies she has not really disclose to them her issues with depression. She indicated she should ask her husband how he thinks she is doing.  Chief Complaint: "higher dose" Visit Diagnosis:  No diagnosis found.  Past Medical History: No past medical history on file. No past surgical history on file. Family History: No family history on file. Social History:  History   Social History  . Marital Status: Married    Spouse Name: N/A  . Number of Children: N/A  . Years of Education: N/A   Social History Main Topics  . Smoking status: Not on file  . Smokeless tobacco: Not on file  . Alcohol Use: Not on file  . Drug Use: Not on file  . Sexual Activity: Not on file    Other Topics Concern  . Not on file   Social History Narrative  . No narrative on file   Additional History:   Assessment:   Musculoskeletal: Strength & Muscle Tone: within normal limits Gait & Station: normal Patient leans: N/A  Psychiatric Specialty Exam: HPI  Review of Systems  Psychiatric/Behavioral: Positive for depression (Improved). Negative for suicidal ideas, hallucinations, memory loss and substance abuse. The patient is not nervous/anxious and does not have insomnia.     There were no vitals taken for this visit.There is no height or weight on file to calculate BMI.  General Appearance: Neat and Well Groomed  Eye Contact:  Good  Speech:  Clear and Coherent and Normal Rate  Volume:  Normal  Mood:  Okay  Affect:  Bright smile  Thought Process:  Linear and Logical  Orientation:  Full (Time, Place, and Person)  Thought Content:  Negative  Suicidal Thoughts:  No  Homicidal Thoughts:  No  Memory:  Immediate;   Good Recent;   Good Remote;   Good  Judgement:  Good  Insight:  Good  Psychomotor Activity:  Negative  Concentration:  Good  Recall:  Good  Fund of Knowledge: Good  Language: Good  Akathisia:  Negative  Handed:  Right unknown  AIMS (if indicated):  Not done  Assets:  Communication Skills Desire for Improvement Social Support Vocational/Educational  ADL's:  Intact  Cognition: WNL  Sleep:  Good, but states that the baby can interrupt her sleep pattern.    Is the patient at risk to self?  No. Has the patient been a risk to self in the past 6 months?  No. Has the patient been a risk to self within the distant past?  No. Is the patient a risk to others?  No. Has the patient been a risk to others in the past 6 months?  No. Has the patient been a risk to others within the distant past?  No.  Current Medications: Current Outpatient Prescriptions  Medication Sig Dispense Refill  . buPROPion (WELLBUTRIN XL) 300 MG 24 hr tablet Take by mouth.    .  escitalopram (LEXAPRO) 10 MG tablet Take by mouth.    . Hydrocodone-Acetaminophen 10-300 MG TABS Take by mouth.    . SUMAtriptan (IMITREX) 100 MG tablet Take by mouth.     No current facility-administered medications for this visit.    Medical Decision Making:  Established Problem, Stable/Improving (1) patient indicates some improvement on medication but wonders whether she could feel even better with a higher dose. He is denying any side effects on the Lexapro.  Treatment Plan Summary:Medication management Side increase her Lexapro from 10 mg daily to 15 mg daily and reassess in 4 weeks. Patient will follow up in 4 weeks. She's been encouraged call if questions or concerns prior to her next appointment.  Wallace Going 02/19/2015, 11:04 AM

## 2015-03-22 ENCOUNTER — Ambulatory Visit (INDEPENDENT_AMBULATORY_CARE_PROVIDER_SITE_OTHER): Payer: 59 | Admitting: Psychiatry

## 2015-03-22 ENCOUNTER — Encounter: Payer: Self-pay | Admitting: Psychiatry

## 2015-03-22 VITALS — BP 108/70 | HR 85 | Ht 65.0 in | Wt 125.6 lb

## 2015-03-22 DIAGNOSIS — F331 Major depressive disorder, recurrent, moderate: Secondary | ICD-10-CM | POA: Diagnosis not present

## 2015-03-22 MED ORDER — ESCITALOPRAM OXALATE 10 MG PO TABS: 15.0000 mg | ORAL_TABLET | Freq: Every day | ORAL | Status: DC

## 2015-03-22 MED ORDER — BUPROPION HCL ER (XL) 300 MG PO TB24: 300.0000 mg | ORAL_TABLET | ORAL | Status: DC

## 2015-03-22 NOTE — Progress Notes (Signed)
BH MD/PA/NP OP Progress Note  03/22/2015 12:09 PM Candice [name]  MRN:  [id number]  Subjective:  Patient returns for follow-up of her major depressive disorder. She states she feels overall stable on the current medication. We increased her Lexapro from 10 mg a day to 50 mg last visit. She states she feels she is able to function at work and is not noting any side effects from the medicine. She states she is able to have fun and states this typically occurs on the weekend.  She relates that one of her stressors is her work schedule which has her working until 6 PM. She relates that her mother-in-law is providing the care of her 83-year-old and that she does worry about the stress on her mother-in-law and the impact on in-laws daily routine. She does state that there is a possibility for him another job that would have hours that ended 4 PM. She relates the schedule that job might make her daily routine more pleasant however the content of the job may not be more enjoyable than her current job.  Assesses an upcoming vacation with her family to the beach. She looks forward to being able to relax.  Chief Complaint: I am able to function Visit Diagnosis:  No diagnosis found.  Past Medical History:  Past Medical History  Diagnosis Date  . Anxiety   . Depression   . Headache     Past Surgical History  Procedure Laterality Date  . Cesarean section     Family History:  Family History  Problem Relation Age of Onset  . Depression Mother   . Anxiety disorder Mother   . Heart attack Mother   . Heart attack Maternal Grandfather   . Anxiety disorder Maternal Grandmother   . Depression Maternal Grandmother   . Diabetes Maternal Grandmother   . Hypertension Maternal Grandmother   . Alcohol abuse Paternal Grandfather    Social History:  History   Social History  . Marital Status: Married    Spouse Name: N/A  . Number of Children: N/A  . Years of Education: N/A   Social History Main  Topics  . Smoking status: Never Smoker   . Smokeless tobacco: Never Used  . Alcohol Use: 0.0 oz/week    0 Standard drinks or equivalent per week     Comment: maybe two a month  . Drug Use: No  . Sexual Activity: Yes    Birth Control/ Protection: Implant   Other Topics Concern  . Not on file   Social History Narrative  . No narrative on file   Additional History:   Assessment:   Musculoskeletal: Strength & Muscle Tone: within normal limits Gait & Station: normal Patient leans: N/A  Psychiatric Specialty Exam: HPI  Review of Systems  Psychiatric/Behavioral: Negative for depression (Improved), suicidal ideas, hallucinations, memory loss and substance abuse. The patient is not nervous/anxious and does not have insomnia.     Blood pressure 108/70, pulse 85, height  (1.651 m), weight 125 lb 9.6 oz (56.972 kg).Body mass index is 20.9 kg/(m^2).  General Appearance: Neat and Well Groomed  Eye Contact:  Good  Speech:  Clear and Coherent and Normal Rate  Volume:  Normal  Mood:  Okay  Affect:  Bright smile  Thought Process:  Linear and Logical  Orientation:  Full (Time, Place, and Person)  Thought Content:  Negative  Suicidal Thoughts:  No  Homicidal Thoughts:  No  Memory:  Immediate;   Good Recent;   Good  Remote;   Good  Judgement:  Good  Insight:  Good  Psychomotor Activity:  Negative  Concentration:  Good  Recall:  Good  Fund of Knowledge: Good  Language: Good  Akathisia:  Negative  Handed:  Right unknown  AIMS (if indicated):  Not done  Assets:  Communication Skills Desire for Improvement Social Support Vocational/Educational  ADL's:  Intact  Cognition: WNL  Sleep:  Good, but states that the baby can interrupt her sleep pattern.    Is the patient at risk to self?  No. Has the patient been a risk to self in the past 6 months?  No. Has the patient been a risk to self within the distant past?  No. Is the patient a risk to others?  No. Has the patient been  a risk to others in the past 6 months?  No. Has the patient been a risk to others within the distant past?  No.  Current Medications: Current Outpatient Prescriptions  Medication Sig Dispense Refill  . buPROPion (WELLBUTRIN XL) 300 MG 24 hr tablet Take by mouth.    . escitalopram (LEXAPRO) 10 MG tablet Take 1.5 tablets (15 mg total) by mouth daily. 45 tablet 1  . ibuprofen (ADVIL,MOTRIN) 800 MG tablet Take 800 mg by mouth every 8 (eight) hours as needed for headache.    . propranolol (INDERAL) 10 MG tablet Take 10 mg by mouth 2 (two) times daily.    . SUMAtriptan (IMITREX) 100 MG tablet Take by mouth.    . Hydrocodone-Acetaminophen 10-300 MG TABS Take by mouth.     No current facility-administered medications for this visit.    Medical Decision Making:  Established Problem, Stable/Improving (1) patient indicates some improvement on medication but wonders whether she could feel even better with a higher dose. He is denying any side effects on the Lexapro.  Treatment Plan Summary:Medication management Continue Lexapro 15 mg daily. She works feeling stable on this dose. She continues on her Wellbutrin XL 300 mg daily. Her OB/GYN was writing for the Wellbutrin however I indicated I could send in a prescription as she states she does not have an appointment with her OB/GYN for several months. Thus I will send in a 30 day supply with 2 refills for her Wellbutrin XL in her Lexapro. She will follow up in 3 months. She's been encouraged call any questions or concerns prior to her next appointment.  Wallace Goinglton Rakayla Ricklefs 03/22/2015, 12:09 PM

## 2015-05-09 ENCOUNTER — Encounter (HOSPITAL_COMMUNITY): Payer: Self-pay | Admitting: Obstetrics and Gynecology

## 2015-06-19 ENCOUNTER — Ambulatory Visit (INDEPENDENT_AMBULATORY_CARE_PROVIDER_SITE_OTHER): Payer: 59 | Admitting: Physician Assistant

## 2015-06-19 ENCOUNTER — Encounter: Payer: Self-pay | Admitting: Physician Assistant

## 2015-06-19 VITALS — BP 128/88 | HR 75 | Wt 133.0 lb

## 2015-06-19 DIAGNOSIS — G43009 Migraine without aura, not intractable, without status migrainosus: Secondary | ICD-10-CM | POA: Diagnosis not present

## 2015-06-19 DIAGNOSIS — G4489 Other headache syndrome: Secondary | ICD-10-CM | POA: Diagnosis not present

## 2015-06-19 MED ORDER — CYCLOBENZAPRINE HCL 10 MG PO TABS: 10.0000 mg | ORAL_TABLET | Freq: Three times a day (TID) | ORAL | Status: DC | PRN

## 2015-06-19 MED ORDER — PROMETHAZINE HCL 25 MG PO TABS: 25.0000 mg | ORAL_TABLET | Freq: Three times a day (TID) | ORAL | Status: DC | PRN

## 2015-06-19 MED ORDER — HYDROCODONE-ACETAMINOPHEN 10-300 MG PO TABS: 1.0000 | ORAL_TABLET | Freq: Four times a day (QID) | ORAL | Status: DC | PRN

## 2015-06-19 MED ORDER — SUMATRIPTAN SUCCINATE 4 MG/0.5ML ~~LOC~~ SOAJ: 1.0000 | SUBCUTANEOUS | Status: DC | PRN

## 2015-06-19 MED ORDER — ONDANSETRON 4 MG PO TBDP: 4.0000 mg | ORAL_TABLET | Freq: Three times a day (TID) | ORAL | Status: DC | PRN

## 2015-06-19 NOTE — Progress Notes (Signed)
Patient ID: Candice [name], female   DOB: 22-Mar-1986, 29 y.o.   MRN: [id number] History:  Candice [name] is a 29 y.o. G1P1001 who presents to clinic today for headaches.  They get to be severe.  They can last up to 12 hours.  Located over the right eye.  There is pulsating and it worsens with movement.  She has associated photophobia, phonophobia, nausea, vomiting.  She has dark floaters during the headache.  Sometimes they come during the night.   The next day she feels completely drained.   Often the most severe headaches are before or during menses.  She has had headaches for years and years. Her mom had migraines also.  Her headaches were better during pregnancy.   She has been treated with Dr. Vela Prose.  She was recently started on propanolol and is supposed to titrate to  bid.  She did not feel well and has discontinued.    She has tried Maxalt, Treximet, Imitrex, Excedrin, Vicodin, Percocet, Ibuprofen, Flurbiprofen, Lidocaine nasal spray (custom compound).  Presently uses Imitrex but is not satisfied with efficacy. Has tried beta blocker, Daith piercing for prevention.   Has FMLA in place.     Past Medical History  Diagnosis Date  . Short cervix in second trimester, antepartum 11/16/2013  . Migraines   . Postpartum care following cesarean delivery (5/29) 02/04/2014  . Anxiety   . Depression   . Headache     Social History   Social History  . Marital Status: Married    Spouse Name: N/A  . Number of Children: N/A  . Years of Education: N/A   Occupational History  . Not on file.   Social History Main Topics  . Smoking status: Never Smoker   . Smokeless tobacco: Never Used  . Alcohol Use: 0.0 oz/week    0 Standard drinks or equivalent per week     Comment: maybe two a month  . Drug Use: No  . Sexual Activity: Yes    Birth Control/ Protection: Implant   Other Topics Concern  . Not on file   Social History Narrative   ** Merged History Encounter **         Family History  Problem Relation Age of Onset  . Depression Mother   . Anxiety disorder Mother   . Heart attack Mother   . Heart attack Maternal Grandfather   . Anxiety disorder Maternal Grandmother   . Depression Maternal Grandmother   . Diabetes Maternal Grandmother   . Hypertension Maternal Grandmother   . Alcohol abuse Paternal Grandfather     Allergies  Allergen Reactions  . Sulfa Antibiotics   . Sulfur Hives    Current Outpatient Prescriptions on File Prior to Visit  Medication Sig Dispense Refill  . buPROPion (WELLBUTRIN XL) 300 MG 24 hr tablet Take 1 tablet (300 mg total) by mouth every morning. 30 tablet 3  . escitalopram (LEXAPRO) 10 MG tablet Take 1.5 tablets (15 mg total) by mouth daily. 45 tablet 3  . Hydrocodone-Acetaminophen 10-300 MG TABS Take by mouth.    [name] ibuprofen (ADVIL,MOTRIN) 800 MG tablet Take 800 mg by mouth every 8 (eight) hours as needed for headache.    . oxyCODONE-acetaminophen (PERCOCET/ROXICET) 5-325 MG per tablet Take 1-2 tablets by mouth every 4 (four) hours as needed for severe pain (moderate - severe pain). 30 tablet 0  . SUMAtriptan (IMITREX) 100 MG tablet Take by mouth.    . propranolol (INDERAL) 10 MG tablet Take 10  mg by mouth 2 (two) times daily.     No current facility-administered medications on file prior to visit.     Review of Systems:  All pertinent positive/negative included in HPI, all other review of systems are negative  Objective:  Physical Exam BP 128/88 mmHg  Pulse 75  Wt 133 lb (60.328 kg)  LMP 06/19/2015  Breastfeeding? Yes CONSTITUTIONAL: Well-developed, well-nourished female in no acute distress.  EYES: EOM intact ENT: Normocephalic CARDIOVASCULAR: Regular rate and rhythm with no adventitious sounds.  RESPIRATORY: Normal rate. Clear to auscultation bilaterally.  ENDOCRINE: Normal thyroid.  MUSCULOSKELETAL: Normal ROM, strength equal bilaterally, significant cervical muscle spasm noted SKIN: Warm, dry  without erythema  NEUROLOGICAL: Alert, oriented, CN II-XII grossly intact, Appropriate balance, No dysmetria, Sensation equal bilaterally, Romberg negative.   PSYCH: Normal behavior, mood   Assessment & Plan:  Assessment: 1. Migraine without aura and without status migrainosus, not intractable   2. Headache associated with hormonal factors    Plan: Will trial Imitrex  injection.  This is particularly helpful when she wakes up with severe migraine.  Zofran for nausea during day Phenergan for migraine rescue medication.  Not to be combined with zofran.  Sedation precautions Flexeril PRN mild HA or muscle spasm.  Sedation precautions.  May be broken in half.  NO more than 1 TID.   Propanolol  for prevention of migraine.   Follow-up in 1 months or sooner PRN  Bertram Denver, PA-C 06/19/2015 4:15 PM

## 2015-06-19 NOTE — Patient Instructions (Signed)

## 2015-07-12 ENCOUNTER — Emergency Department: Payer: 59

## 2015-07-12 ENCOUNTER — Emergency Department
Admission: EM | Admit: 2015-07-12 | Discharge: 2015-07-12 | Disposition: A | Discharge status: Home / Self Care | Payer: 59 | Attending: Emergency Medicine | Admitting: Emergency Medicine

## 2015-07-12 DIAGNOSIS — Z3202 Encounter for pregnancy test, result negative: Secondary | ICD-10-CM | POA: Insufficient documentation

## 2015-07-12 DIAGNOSIS — R61 Generalized hyperhidrosis: Secondary | ICD-10-CM | POA: Diagnosis not present

## 2015-07-12 DIAGNOSIS — R55 Syncope and collapse: Secondary | ICD-10-CM | POA: Insufficient documentation

## 2015-07-12 DIAGNOSIS — R42 Dizziness and giddiness: Secondary | ICD-10-CM | POA: Insufficient documentation

## 2015-07-12 DIAGNOSIS — Z79899 Other long term (current) drug therapy: Secondary | ICD-10-CM | POA: Diagnosis not present

## 2015-07-12 LAB — TROPONIN I

## 2015-07-12 LAB — BASIC METABOLIC PANEL
ANION GAP: 5 (ref 5–15)
BUN: 14 mg/dL (ref 6–20)
CALCIUM: 9 mg/dL (ref 8.9–10.3)
CHLORIDE: 107 mmol/L (ref 101–111)
CO2: 27 mmol/L (ref 22–32)
Creatinine, Ser: 0.79 mg/dL (ref 0.44–1.00)
GFR calc non Af Amer: >60 mL/min (ref >60)
Glucose, Bld: 100 mg/dL — ABNORMAL HIGH (ref 65–99)
Potassium: 3.5 mmol/L (ref 3.5–5.1)
SODIUM: 139 mmol/L (ref 135–145)

## 2015-07-12 LAB — CBC
HCT: 43.1 % (ref 35.0–47.0)
HEMOGLOBIN: 14.2 g/dL (ref 12.0–16.0)
MCH: 29 pg (ref 26.0–34.0)
MCHC: 32.8 g/dL (ref 32.0–36.0)
MCV: 88.5 fL (ref 80.0–100.0)
Platelets: 244 10*3/uL (ref 150–440)
RBC: 4.88 MIL/uL (ref 3.80–5.20)
RDW: 13.4 % (ref 11.5–14.5)
WBC: 8.3 10*3/uL (ref 3.6–11.0)

## 2015-07-12 LAB — TSH: TSH: 0.696 u[IU]/mL (ref 0.350–4.500)

## 2015-07-12 LAB — HCG, QUANTITATIVE, PREGNANCY

## 2015-07-12 MED ORDER — ONDANSETRON HCL 4 MG/2ML IJ SOLN
4.0000 mg | Freq: Once | INTRAMUSCULAR | Status: AC
  Administered 2015-07-12: 4 mg via INTRAVENOUS
  Filled 2015-07-12: qty 2

## 2015-07-12 MED ORDER — SODIUM CHLORIDE 0.9 % IV SOLN
1000.0000 mL | Freq: Once | INTRAVENOUS | Status: AC
  Administered 2015-07-12: 1000 mL via INTRAVENOUS

## 2015-07-12 NOTE — ED Notes (Signed)
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  Patient denies loss of consciousness. Per Floor nurse CBG 98.  Vitals signs stable.

## 2015-07-12 NOTE — ED Provider Notes (Signed)
[facility] Emergency Department Provider Note  ____________________________________________  Time seen: On arrival  I have reviewed the triage vital signs and the nursing notes.   HISTORY  Chief Complaint Near Syncope    HPI Candice [name] is a 29 y.o. 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 She reports she became very lightheaded and had to rest her head on desk. Bystanders state that she was pale and diaphoretic. Patient notes that this is happened several times over the last month she has no explanation for it. She denies chest pain. Short of breath. No recent travel. No nausea or vomiting. She reports typically her symptoms are lightheadedness and occasionally more vertigo-like symptoms. She does have a history of migraines but does not have any headaches with symptoms. No neuro deficits. Denies palpitations     Past Medical History  Diagnosis Date  . Short cervix in second trimester, antepartum 11/16/2013  . Migraines   . Postpartum care following cesarean delivery (5/29) 02/04/2014  . Anxiety   . Depression   . Headache     Patient Active Problem List   Diagnosis Date Noted  . Depression, major, recurrent, moderate ([place]) 02/19/2015  . Postpartum care following cesarean delivery (5/29) 02/04/2014  . Indication for care in labor or delivery 02/03/2014  . Short cervix in second trimester, antepartum 11/16/2013  . Antepartum bleeding, second trimester 11/15/2013    Past Surgical History  Procedure Laterality Date  . Wisdom tooth extraction  2002  . Cesarean section N/A 02/03/2014    Procedure: Primary Cesarean Section Delivery Baby Girl @ 1610, Apgars 9/9;  Surgeon: Marvene Staff, MD;  Location: [facility];  Service: Obstetrics;  Laterality: N/A;  . Cesarean section      Current Outpatient Rx  Name  Route  Sig  Dispense  Refill  . buPROPion (WELLBUTRIN XL)  300 MG 24 hr tablet   Oral   Take 1 tablet (300 mg total) by mouth every morning.   30 tablet   3   . cyclobenzaprine (FLEXERIL) 10 MG tablet   Oral   Take 1 tablet (10 mg total) by mouth every 8 (eight) hours as needed for muscle spasms. May use 1/2 tab   30 tablet   1   . escitalopram (LEXAPRO) 10 MG tablet   Oral   Take 1.5 tablets (15 mg total) by mouth daily.   45 tablet   3   . Hydrocodone-Acetaminophen 10-300 MG TABS   Oral   Take 1-2 tablets by mouth every 6 (six) hours as needed.   30 each   0   . ibuprofen (ADVIL,MOTRIN) 800 MG tablet   Oral   Take 800 mg by mouth every 8 (eight) hours as needed for headache.         . ondansetron (ZOFRAN ODT) 4 MG disintegrating tablet   Oral   Take 1 tablet (4 mg total) by mouth every 8 (eight) hours as needed for nausea or vomiting.   20 tablet   0   . oxyCODONE-acetaminophen (PERCOCET/ROXICET) 5-325 MG per tablet   Oral   Take 1-2 tablets by mouth every 4 (four) hours as needed for severe pain (moderate - severe pain).   30 tablet   0   . promethazine (PHENERGAN) 25 MG tablet   Oral   Take 1 tablet (25 mg total) by mouth every 8 (eight) hours as needed for nausea or vomiting (  headache).   30 tablet   0   . propranolol (INDERAL) 10 MG tablet   Oral   Take 10 mg by mouth 2 (two) times daily.         . SUMAtriptan (IMITREX) 100 MG tablet   Oral   Take by mouth.         . SUMAtriptan Succinate 4 MG/0.5ML SOAJ   Subcutaneous   Inject 1 kit into the skin as needed (May repeat after 1 hour).   8 cartridge   1     Allergies Sulfa antibiotics and Sulfur  Family History  Problem Relation Age of Onset  . Depression Mother   . Anxiety disorder Mother   . Heart attack Mother   . Heart attack Maternal Grandfather   . Anxiety disorder Maternal Grandmother   . Depression Maternal Grandmother   . Diabetes Maternal Grandmother   . Hypertension Maternal Grandmother   . Alcohol abuse Paternal Grandfather      Social History Social History  Substance Use Topics  . Smoking status: Never Smoker   . Smokeless tobacco: Never Used  . Alcohol Use: 0.0 oz/week    0 Standard drinks or equivalent per week     Comment: maybe two a month    Review of Systems  Constitutional: Negative for fever. Eyes: Negative for visual changes. ENT: Negative for sore throat Cardiovascular: Negative for chest pain. Respiratory: Negative for shortness of breath. Gastrointestinal: Negative for abdominal pain, vomiting and diarrhea. Genitourinary: Negative for dysuria. Musculoskeletal: Negative for back pain. Skin: Negative for rash. Neurological: Negative for headaches or focal weakness Psychiatric no anxiety    ____________________________________________   PHYSICAL EXAM:  VITAL SIGNS: ED Triage Vitals  Enc Vitals Group     BP 07/12/15 1130 126/88 mmHg     Pulse Rate 07/12/15 1133 82     Resp 07/12/15 1130 19     Temp 07/12/15 1133 98.1 F (36.7 C)     Temp Source 07/12/15 1133 Oral     SpO2 07/12/15 1133 100 %     Weight 07/12/15 1133 134 lb (60.782 kg)     Height 07/12/15 1133 _0  (1.626 m)     Head Cir --      Peak Flow --      Pain Score --      Pain Loc --      Pain Edu? --      Excl. in [place]? --     Constitutional: Alert and oriented. Well appearing and in no distress. Pleasant and interactive Eyes: Conjunctivae are normal. EOMI ENT   Head: Normocephalic and atraumatic.   Mouth/Throat: Mucous membranes are moist. Cardiovascular: Normal rate, regular rhythm. Normal and symmetric distal pulses are present in all extremities. No murmurs, rubs, or gallops. Respiratory: Normal respiratory effort without tachypnea nor retractions. Breath sounds are clear and equal bilaterally.   Genitourinary: deferred Musculoskeletal:  No lower extremity tenderness nor edema. Neurologic:  Normal speech and language. No gross focal neurologic deficits are appreciated. Cranial nerves II-12 are  normal Skin:  Skin is warm, dry and intact. No rash noted. Psychiatric: Mood and affect are normal. Patient exhibits appropriate insight and judgment.  ____________________________________________    LABS (pertinent positives/negatives)  Labs Reviewed  BASIC METABOLIC PANEL - Abnormal; Notable for the following:    Glucose, Bld 100 (*)    All other components within normal limits  CBC  HCG, QUANTITATIVE, PREGNANCY  TROPONIN I  TSH  CBG MONITORING, ED  ____________________________________________   EKG  ED ECG REPORT I, Lavonia Drafts, the attending physician, personally viewed and interpreted this ECG.  Date: 07/12/2015 EKG Time: 11:31 AM Rate: 76 Rhythm: normal sinus rhythm QRS Axis: normal Intervals: normal ST/T Wave abnormalities: normal Conduction Disutrbances: none Narrative Interpretation: unremarkable   ____________________________________________    RADIOLOGY I have personally reviewed any xrays that were ordered on this patient: CT head is unremarkable  ____________________________________________   PROCEDURES  Procedure(s) performed: none  Critical Care performed: none  ____________________________________________   INITIAL IMPRESSION / ASSESSMENT AND PLAN / ED COURSE  Pertinent labs & imaging results that were available during my care of the patient were reviewed by me and considered in my medical decision making (see chart for details).  Patient well-appearing and in no distress. Several episodes of near syncope over the last month. We will check labs, fluids, obtain CT head given history of vertigo symptoms and reevaluate  Patient is feeling well after fluids. We discussed the need for outpatient follow-up. I recommend following up with cardiology and her PCP. She agrees to this plan. She is return if any worsening of her symptoms. I have asked her to rest at home and to increase fluid  consumption  ____________________________________________   FINAL CLINICAL IMPRESSION(S) / ED DIAGNOSES  Final diagnoses:  Syncope and collapse     Lavonia Drafts, MD 07/12/15 1506

## 2015-07-12 NOTE — Discharge Instructions (Signed)

## 2015-07-12 NOTE — ED Notes (Signed)
Patient complained of nausea, verbal order received for zofran.

## 2015-07-18 ENCOUNTER — Ambulatory Visit (INDEPENDENT_AMBULATORY_CARE_PROVIDER_SITE_OTHER): Payer: 59 | Admitting: Nurse Practitioner

## 2015-07-18 ENCOUNTER — Encounter: Payer: Self-pay | Admitting: Nurse Practitioner

## 2015-07-18 VITALS — BP 120/83 | HR 88 | Ht 64.0 in | Wt 138.0 lb

## 2015-07-18 DIAGNOSIS — R55 Syncope and collapse: Secondary | ICD-10-CM

## 2015-07-18 NOTE — Progress Notes (Signed)
Patient Name: Candice [name] Date of Encounter: 07/18/2015  Primary Care Provider:  Merrilee Seashore, MD Primary Cardiologist:  New  Chief Complaint  29 year old female with a history of migraine headaches who presents for evaluation secondary to presyncope and possibly syncope.  Past Medical History   Past Medical History  Diagnosis Date  . Short cervix in second trimester, antepartum 11/16/2013  . Migraines     a. x 10+ years.  . Postpartum care following cesarean delivery (5/29) 02/04/2014  . Anxiety   . Depression   . Pre-syncope     a. 07/2015 [facility] ED visit, ? vasovagal, improved with IVF.   Past Surgical History  Procedure Laterality Date  . Wisdom tooth extraction  2002  . Cesarean section N/A 02/03/2014    Procedure: Primary Cesarean Section Delivery Baby Girl @ 1610, Apgars 9/9;  Surgeon: Marvene Staff, MD;  Location: [facility];  Service: Obstetrics;  Laterality: N/A;  . Cesarean section      Allergies  Allergies  Allergen Reactions  . Sulfa Antibiotics   . Sulfur Hives    HPI  29 year old female without a prior cardiac history. She has about a 10 year history of migraine headaches, which occur several times per month and require when necessary ibuprofen, and sometimes Imitrex, or Vicodin. She does have a family history of premature coronary artery disease with her mother experiencing a non-STEMI at the age of 29. Over her lifetime, she has had intermittent episodes of lightheadedness associated with nausea and diaphoresis. She can name at least 3 occasions dating back to her early 70s when episodes occurred while standing and resolved with lying down. she has never been evaluated for presyncope or syncope in the past. More recently, she was placed on propranolol therapy for migraine headaches and after the dose was titrated to 20 mg twice a day, she had an episode of lightheadedness while she was a passenger in a car. She stopped taking propranolol after  that episode.  Approximately 2 weeks ago she was at work as a Tourist information centre manager at [employer] and was seated. She noted mild lightheadedness associated with mild nausea and diaphoresis. She discussed this with a coworker and walked to employee health. She said that while walking, she stumbled some as she felt off balance. At employee health, her blood glucose was normal as was her blood pressure. Symptoms resolved and she left work for the day. Approximately 1 week ago, she was again at work and while sitting developed lightheadedness prompting her to rest her head on the desk. This was again noted by coworker. As she was on one of the nursing units, she was placed on a monitor and reportedly had a heart rate in the 120s. Her blood pressure was 146. She says that she felt nauseated and mildly diaphoretic and just very weak. She isn't sure if she lost consciousness or not but says that things were getting black. She wasn't able to stand up in order to move to a wheelchair. The staff assisted in moving her to a wheelchair and then brought her down to the emergency department. There, ECG was nonacute. Head CT was normal. Vital signs are stable and after being treated with IV fluids, she had significant improvement in symptoms and was subsequently discharged. One day after discharge, she had a significant migraine headache. Since discharge from the emergency department, she has had 2 brief episodes of lightheadedness without associated symptoms. She denies any prior history of PND, orthopnea, edema, chest  pain, palpitations, dyspnea, or early satiety. She has not had any recent changes in her medications and none of these symptoms are temporally related to having taken migraine or pain medication.  Home Medications  Prior to Admission medications   Medication Sig Start Date End Date Taking? Authorizing Provider  buPROPion (WELLBUTRIN XL) 300 MG 24 hr tablet Take 1 tablet (300 mg total) by mouth every morning.  03/22/15  Yes Marjie Skiff, MD  cyclobenzaprine (FLEXERIL) 10 MG tablet Take 1 tablet (10 mg total) by mouth every 8 (eight) hours as needed for muscle spasms. May use 1/2 tab 06/19/15  Yes Collene Leyden Teague Clark, PA-C  escitalopram (LEXAPRO) 10 MG tablet Take 1.5 tablets (15 mg total) by mouth daily. 03/22/15  Yes Marjie Skiff, MD  Hydrocodone-Acetaminophen 10-300 MG TABS Take 1-2 tablets by mouth every 6 (six) hours as needed. 06/19/15  Yes Collene Leyden Teague Clark, PA-C  ibuprofen (ADVIL,MOTRIN) 800 MG tablet Take 800 mg by mouth every 8 (eight) hours as needed for headache.   Yes Historical Provider, MD  ondansetron (ZOFRAN ODT) 4 MG disintegrating tablet Take 1 tablet (4 mg total) by mouth every 8 (eight) hours as needed for nausea or vomiting. 06/19/15  Yes Collene Leyden Teague Clark, PA-C  promethazine (PHENERGAN) 25 MG tablet Take 1 tablet (25 mg total) by mouth every 8 (eight) hours as needed for nausea or vomiting (headache). 06/19/15  Yes Collene Leyden Teague Clark, PA-C  SUMAtriptan (IMITREX) 100 MG tablet Take 100 mg by mouth as needed.    Yes Historical Provider, MD  SUMAtriptan Succinate 4 MG/0.5ML SOAJ Inject 1 kit into the skin as needed (May repeat after 1 hour). 06/19/15  Yes Collene Leyden Teague Clark, PA-C  propranolol (INDERAL) 10 MG tablet Take 10 mg by mouth 2 (two) times daily as needed.     Michel Santee, MD    Family History  Family History  Problem Relation Age of Onset  . Depression Mother   . Anxiety disorder Mother   . Heart attack Mother     NSTEMI @ age 23.  [name] Heart attack Maternal Grandfather   . Anxiety disorder Maternal Grandmother   . Depression Maternal Grandmother   . Diabetes Maternal Grandmother   . Hypertension Maternal Grandmother   . Alcohol abuse Paternal Grandfather   . Sleep apnea Father     alive @ 73.    Social History  Social History   Social History  . Marital Status: Married    Spouse Name: N/A  . Number of Children: N/A  . Years of Education: N/A    Occupational History  . Not on file.   Social History Main Topics  . Smoking status: Never Smoker   . Smokeless tobacco: Never Used  . Alcohol Use: 0.0 oz/week    0 Standard drinks or equivalent per week     Comment: maybe two drinks/month.  . Drug Use: No  . Sexual Activity: Yes    Birth Control/ Protection: Implant   Other Topics Concern  . Not on file   Social History Narrative   Lives in [place] with her husband and 36 month old dtr.  Does not routinely exercise.         Review of Systems  General:  No chills, fever, night sweats or weight changes.  Positive diaphoresis in the setting of presyncope. Cardiovascular:  Notable for presyncope and possible syncope.  No chest pain, dyspnea on exertion, edema, orthopnea, palpitations, paroxysmal nocturnal dyspnea. Dermatological:  No rash, lesions/masses Respiratory: No cough, dyspnea Urologic: No hematuria, dysuria Abdominal:   Positive nausea in setting of presyncope. No vomiting, diarrhea, bright red blood per rectum, melena, or hematemesis Neurologic:  No visual changes, wkns, changes in mental status. All other systems reviewed and are otherwise negative except as noted above.  Physical Exam  VS:  BP 120/83 mmHg  Pulse 88  Ht 5' 4"  (1.626 m)  Wt 138 lb (62.596 kg)  BMI 23.68 kg/m2  LMP 06/19/2015 (Within Weeks) , BMI Body mass index is 23.68 kg/(m^2).  Blood pressure lying 124/83, heart rate 87. Blood pressure sitting 126/85, heart rate 93. Blood pressure standing, 0 minutes, 124/90, heart rate 88. Blood pressure standing, 3 minutes, 124/88, heart rate 93.   GEN: Well nourished, well developed, in no acute distress. HEENT: normal. Neck: Supple, no JVD, carotid bruits, or masses. Cardiac: RRR, no murmurs, rubs, or gallops. No clubbing, cyanosis, edema.  Radials/DP/PT 2+ and equal bilaterally.  Respiratory:  Respirations regular and unlabored, clear to auscultation bilaterally. GI: Soft, nontender, nondistended,  BS + x 4. MS: no deformity or atrophy. Skin: warm and dry, no rash. Neuro:  Strength and sensation are intact. Psych: Normal affect.  Accessory Clinical Findings  ECG -reveals sinus rhythm, 88, no acute ST or T changes.  Assessment & Plan  1.  Presyncope and syncope: Over the past 2 weeks, patient has had 2 episodes of presyncope and possibly one episode of syncope associated with nausea and diaphoresis. Both episodes occurred while at work and while sitting. The second and more recent episode lasted a longer period of time and though she is not sure if she passed out or not, says that her vision did seem to go black for at least some period of time. On that occasion, she was evaluated by nursing staff and was apparently tachycardic. It is not known what the rhythm was although when she was seen in the emergency department, she was in sinus rhythm. Blood glucose was normal as well as blood pressure. Head CT was negative. She does have a prior history of syncope that has occurred while standing. I suspect that these symptoms are neurally mediated in nature and most likely represent vasovagal responses. I will check a 2-D echocardiogram to assess LV function and rule out mechanical abnormalities. Further, we will place a 30 day event monitor to further rule out arrhythmia. Encouraged her to wear support hose and pending evaluation on monitoring along with burden of symptoms, we may need to consider an abdominal binder. I've encouraged her to stay hydrated and that if symptoms recur, she should lie down immediately.  2. Disposition: Follow-up in 4-6 weeks.  Murray Hodgkins, NP 07/18/2015, 12:41 PM

## 2015-07-18 NOTE — Patient Instructions (Signed)
Medication Instructions:  Your physician recommends that you continue on your current medications as directed. Please refer to the Current Medication list given to you today.   Labwork: none  Testing/Procedures: Your physician has requested that you have an echocardiogram. Echocardiography is a painless test that uses sound waves to create images of your heart. It provides your doctor with information about the size and shape of your heart and how well your heart's chambers and valves are working. This procedure takes approximately one hour. There are no restrictions for this procedure.  Your physician has recommended that you wear an event monitor. Event monitors are medical devices that record the heart's electrical activity. Doctors most often [place] these monitors to diagnose arrhythmias. Arrhythmias are problems with the speed or rhythm of the heartbeat. The monitor is a small, portable device. You can wear one while you do your normal daily activities. This is usually used to diagnose what is causing palpitations/syncope (passing out).    Follow-Up: Your physician recommends that you schedule a follow-up appointment in: six weeks with Eula Listen, PA-C or Dr. Mariah Milling   Any Other Special Instructions Will Be Listed Below (If Applicable). Recommendation to wear support hose.     If you need a refill on your cardiac medications before your next appointment, please call your pharmacy.  Echocardiogram An echocardiogram, or echocardiography, uses sound waves (ultrasound) to produce an image of your heart. The echocardiogram is simple, painless, obtained within a short period of time, and offers valuable information to your health care provider. The images from an echocardiogram can provide information such as:  Evidence of coronary artery disease (CAD).  Heart size.  Heart muscle function.  Heart valve function.  Aneurysm detection.  Evidence of a past heart attack.  Fluid buildup  around the heart.  Heart muscle thickening.  Assess heart valve function. LET [facility] CARE PROVIDER KNOW ABOUT:  Any allergies you have.  All medicines you are taking, including vitamins, herbs, eye drops, creams, and over-the-counter medicines.  Previous problems you or members of your family have had with the use of anesthetics.  Any blood disorders you have.  Previous surgeries you have had.  Medical conditions you have.  Possibility of pregnancy, if this applies. BEFORE THE PROCEDURE  No special preparation is needed. Eat and drink normally.  PROCEDURE   In order to produce an image of your heart, gel will be applied to your chest and a wand-like tool (transducer) will be moved over your chest. The gel will help transmit the sound waves from the transducer. The sound waves will harmlessly bounce off your heart to allow the heart images to be captured in real-time motion. These images will then be recorded.  You may need an IV to receive a medicine that improves the quality of the pictures. AFTER THE PROCEDURE You may return to your normal schedule including diet, activities, and medicines, unless your health care provider tells you otherwise.   This information is not intended to replace advice given to you by your health care provider. Make sure you discuss any questions you have with your health care provider.   Document Released: 08/22/2000 Document Revised: 09/15/2014 Document Reviewed: 05/02/2013 Elsevier Interactive Patient Education 2016 Elsevier Inc. Cardiac Event Monitoring A cardiac event monitor is a small recording device used to help detect abnormal heart rhythms (arrhythmias). The monitor is used to record heart rhythm when noticeable symptoms such as the following occur:  Fast heartbeats (palpitations), such as heart racing or  fluttering.  Dizziness.  Fainting or light-headedness.  Unexplained weakness. The monitor is wired to two electrodes placed  on your chest. Electrodes are flat, sticky disks that attach to your skin. The monitor can be worn for up to 30 days. You will wear the monitor at all times, except when bathing.  HOW TO USE YOUR CARDIAC EVENT MONITOR A technician will prepare your chest for the electrode placement. The technician will show you how to place the electrodes, how to work the monitor, and how to replace the batteries. Take time to practice using the monitor before you leave the office. Make sure you understand how to send the information from the monitor to your health care provider. This requires a telephone with a landline, not a cell phone. You need to:  Wear your monitor at all times, except when you are in water:  Do not get the monitor wet.  Take the monitor off when bathing. Do not swim or use a hot tub with it on.  Keep your skin clean. Do not put body lotion or moisturizer on your chest.  Change the electrodes daily or any time they stop sticking to your skin. You might need to use tape to keep them on.  It is possible that your skin under the electrodes could become irritated. To keep this from happening, try to put the electrodes in slightly different places on your chest. However, they must remain in the area under your left breast and in the upper right section of your chest.  Make sure the monitor is safely clipped to your clothing or in a location close to your body that your health care provider recommends.  Press the button to record when you feel symptoms of heart trouble, such as dizziness, weakness, light-headedness, palpitations, thumping, shortness of breath, unexplained weakness, or a fluttering or racing heart. The monitor is always on and records what happened slightly before you pressed the button, so do not worry about being too late to get good information.  Keep a diary of your activities, such as walking, doing chores, and taking medicine. It is especially important to note what you were  doing when you pushed the button to record your symptoms. This will help your health care provider determine what might be contributing to your symptoms. The information stored in your monitor will be reviewed by your health care provider alongside your diary entries.  Send the recorded information as recommended by your health care provider. It is important to understand that it will take some time for your health care provider to process the results.  Change the batteries as recommended by your health care provider. SEEK IMMEDIATE MEDICAL CARE IF:   You have chest pain.  You have extreme difficulty breathing or shortness of breath.  You develop a very fast heartbeat that persists.  You develop dizziness that does not go away.  You faint or constantly feel you are about to faint.   This information is not intended to replace advice given to you by your health care provider. Make sure you discuss any questions you have with your health care provider.   Document Released: 06/03/2008 Document Revised: 09/15/2014 Document Reviewed: 02/21/2013 Elsevier Interactive Patient Education [employer]2016 Elsevier Inc.

## 2015-07-19 ENCOUNTER — Encounter: Payer: Self-pay | Admitting: Psychiatry

## 2015-07-19 ENCOUNTER — Ambulatory Visit (INDEPENDENT_AMBULATORY_CARE_PROVIDER_SITE_OTHER): Payer: 59 | Admitting: Psychiatry

## 2015-07-19 VITALS — BP 110/72 | HR 104 | Temp 97.7°F | Ht 64.0 in | Wt 137.6 lb

## 2015-07-19 DIAGNOSIS — F331 Major depressive disorder, recurrent, moderate: Secondary | ICD-10-CM

## 2015-07-19 MED ORDER — BUPROPION HCL ER (XL) 300 MG PO TB24: 300.0000 mg | ORAL_TABLET | ORAL | Status: DC

## 2015-07-19 MED ORDER — ESCITALOPRAM OXALATE 10 MG PO TABS: 15.0000 mg | ORAL_TABLET | Freq: Every day | ORAL | Status: DC

## 2015-07-19 NOTE — Progress Notes (Signed)
BH MD/PA/NP OP Progress Note  07/19/2015 12:18 PM Candice [name]  MRN:  [id number]  Subjective:  Patient returns for follow-up of her major depressive disorder. She indicates overall she's been doing well on the current medication regimen. She stated that the only thing that is, is that her husband has pointed out that she is been a little more irritable over the past 2 weeks. Patient reflects and states that after he has pointed something out to her she then reflects that she might of been irritable. She did site there are some stressors in her life such as being evaluated for cardiac issues. She states she's had some syncopal episodes and dizziness. She states she's been to the emergency room and has been assessed for these things but has an upcoming cardiology appointment in which there are plans to do echo. She's also been getting some assessment for migraines and related that the PA that she saw for migraine complaints mentioned use of what the patient believes is Celexa for migraine prevention. Writer explained I have heard and seen use of try cyclic antidepressants for prevention of migraines. Patient indicates she has a follow-up appointment with this PA asked week and will get further clarification. I did tell her that if it ended up being Celexa we could make an attempt to switch her to that medication as it is closely related to the Lexapro which she is currently on.  She indicated other than these issues above things are going pretty well. 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 She also states she continues to enjoy her daughter.  Chief Complaint: Irritable Chief Complaint    Follow-up; Medication Refill; Other    I am able to function Visit Diagnosis:     ICD-9-CM ICD-10-CM   1. Major depressive disorder, recurrent episode, moderate (HCC) 296.32 F33.1     Past Medical History:  Past Medical History  Diagnosis Date  . Short cervix in  second trimester, antepartum 11/16/2013  . Migraines     a. x 10+ years.  . Postpartum care following cesarean delivery (5/29) 02/04/2014  . Anxiety   . Depression   . Pre-syncope     a. 07/2015 [facility] ED visit, ? vasovagal, improved with IVF.    Past Surgical History  Procedure Laterality Date  . Wisdom tooth extraction  2002  . Cesarean section N/A 02/03/2014    Procedure: Primary Cesarean Section Delivery Baby Girl @ 3403, Apgars 9/9;  Surgeon: Marvene Staff, MD;  Location: [facility];  Service: Obstetrics;  Laterality: N/A;  . Cesarean section     Family History:  Family History  Problem Relation Age of Onset  . Depression Mother   . Anxiety disorder Mother   . Heart attack Mother     NSTEMI @ age 8.  [name] Heart attack Maternal Grandfather   . Anxiety disorder Maternal Grandmother   . Depression Maternal Grandmother   . Diabetes Maternal Grandmother   . Hypertension Maternal Grandmother   . Alcohol abuse Paternal Grandfather   . Sleep apnea Father     alive @ 2.   Social History:  Social History   Social History  . Marital Status: Married    Spouse Name: N/A  . Number of Children: N/A  . Years of Education: N/A   Social History Main Topics  . Smoking status: Never Smoker   . Smokeless tobacco: Never Used  . Alcohol Use: 0.0 oz/week  0 Standard drinks or equivalent per week     Comment: maybe two drinks/month.  . Drug Use: No  . Sexual Activity: Yes    Birth Control/ Protection: Implant   Other Topics Concern  . None   Social History Narrative   Lives in [place] with her husband and 38 month old dtr.  Does not routinely exercise.       Additional History:   Assessment:   Musculoskeletal: Strength & Muscle Tone: within normal limits Gait & Station: normal Patient leans: N/A  Psychiatric Specialty Exam: HPI  Review of Systems  Psychiatric/Behavioral: Negative for depression (Improved), suicidal ideas, hallucinations, memory loss and substance  abuse. The patient is not nervous/anxious and does not have insomnia.   All other systems reviewed and are negative.   Blood pressure 110/72, pulse 104, temperature 97.7 F (36.5 C), temperature source Tympanic, height _0  (1.626 m), weight 137 lb 9.6 oz (62.415 kg), last menstrual period 06/19/2015, SpO2 98 %, currently breastfeeding.Body mass index is 23.61 kg/(m^2).  General Appearance: Neat and Well Groomed  Eye Contact:  Good  Speech:  Clear and Coherent and Normal Rate  Volume:  Normal  Mood:  Okay  Affect:  Bright smile  Thought Process:  Linear and Logical  Orientation:  Full (Time, Place, and Person)  Thought Content:  Negative  Suicidal Thoughts:  No  Homicidal Thoughts:  No  Memory:  Immediate;   Good Recent;   Good Remote;   Good  Judgement:  Good  Insight:  Good  Psychomotor Activity:  Negative  Concentration:  Good  Recall:  Good  Fund of Knowledge: Good  Language: Good  Akathisia:  Negative  Handed:  Right unknown  AIMS (if indicated):  Not done  Assets:  Communication Skills Desire for Improvement Social Support Vocational/Educational  ADL's:  Intact  Cognition: WNL  Sleep:  Good, but states that the baby can interrupt her sleep pattern.    Is the patient at risk to self?  No. Has the patient been a risk to self in the past 6 months?  No. Has the patient been a risk to self within the distant past?  No. Is the patient a risk to others?  No. Has the patient been a risk to others in the past 6 months?  No. Has the patient been a risk to others within the distant past?  No.  Current Medications: Current Outpatient Prescriptions  Medication Sig Dispense Refill  . buPROPion (WELLBUTRIN XL) 300 MG 24 hr tablet Take 1 tablet (300 mg total) by mouth every morning. 30 tablet 3  . escitalopram (LEXAPRO) 10 MG tablet Take 1.5 tablets (15 mg total) by mouth daily. 45 tablet 3  . Hydrocodone-Acetaminophen 10-300 MG TABS Take 1-2 tablets by mouth every 6 (six)  hours as needed. 30 each 0  . ibuprofen (ADVIL,MOTRIN) 800 MG tablet Take 800 mg by mouth every 8 (eight) hours as needed for headache.    . ondansetron (ZOFRAN ODT) 4 MG disintegrating tablet Take 1 tablet (4 mg total) by mouth every 8 (eight) hours as needed for nausea or vomiting. 20 tablet 0  . SUMAtriptan (IMITREX) 100 MG tablet Take 100 mg by mouth as needed.     . SUMAtriptan Succinate 4 MG/0.5ML SOAJ Inject 1 kit into the skin as needed (May repeat after 1 hour). 8 cartridge 1  . cyclobenzaprine (FLEXERIL) 10 MG tablet Take 1 tablet (10 mg total) by mouth every 8 (eight) hours as needed for muscle spasms.  May use 1/2 tab (Patient not taking: Reported on 07/19/2015) 30 tablet 1  . promethazine (PHENERGAN) 25 MG tablet Take 1 tablet (25 mg total) by mouth every 8 (eight) hours as needed for nausea or vomiting (headache). (Patient not taking: Reported on 07/19/2015) 30 tablet 0  . propranolol (INDERAL) 10 MG tablet Take 10 mg by mouth 2 (two) times daily as needed.      No current facility-administered medications for this visit.    Medical Decision Making:  Established Problem, Stable/Improving (1) patient indicates some improvement on medication but wonders whether she could feel even better with a higher dose. He is denying any side effects on the Lexapro.  Treatment Plan Summary:Medication management Major depressive disorder, recurrent, moderate Continue Expo 15 mg daily and  Wellbutrin XL 300 mg daily.   Patient will follow up in 3 months. However I've encouraged calling questions concerns prior to her next point. As discussed above should she want to make an attempt to use Celexa based on the PA recommendations for it being used for migraine prevention I've instructed to call the clinic and perhaps will have an earlier follow-up.  Faith Rogue 07/19/2015, 12:18 PM

## 2015-07-23 ENCOUNTER — Ambulatory Visit (INDEPENDENT_AMBULATORY_CARE_PROVIDER_SITE_OTHER): Payer: 59

## 2015-07-23 DIAGNOSIS — R55 Syncope and collapse: Secondary | ICD-10-CM | POA: Diagnosis not present

## 2015-07-24 ENCOUNTER — Ambulatory Visit (INDEPENDENT_AMBULATORY_CARE_PROVIDER_SITE_OTHER): Payer: 59 | Admitting: Physician Assistant

## 2015-07-24 ENCOUNTER — Encounter: Payer: Self-pay | Admitting: Physician Assistant

## 2015-07-24 VITALS — BP 118/86 | HR 85 | Resp 18 | Ht 64.0 in | Wt 138.0 lb

## 2015-07-24 DIAGNOSIS — G43009 Migraine without aura, not intractable, without status migrainosus: Secondary | ICD-10-CM | POA: Diagnosis not present

## 2015-07-24 MED ORDER — SUMATRIPTAN SUCCINATE 100 MG PO TABS: 100.0000 mg | ORAL_TABLET | ORAL | Status: DC | PRN

## 2015-07-24 NOTE — Patient Instructions (Signed)

## 2015-07-24 NOTE — Progress Notes (Signed)
Patient ID: Candice [name], female   DOB: 1986-05-01, 29 y.o.   MRN: [id number] History:  Candice [name] is a 29 y.o. G1P1001 who presents to clinic today for follow up of headaches.  She did try SQ imitrex 61m once.  She initially felt the side effects rush over her body.  The HA went from a 10 to a 5.  She took a vicodin and then later 8064mof ibuprofen to make the HA go away finally after 6-8 hours and sleep.  Her HA's are otherwise unchanged.   She did not restart the propanolol as was discussed last visit.    She has had a couple episodes of syncope since last seen. She is under the care of a cardiologist and presently is wearing a 30 day monitor.  She also had a head CT that was negative.  She also saw her psychiatrist whom seems willing to make changes to her medications if needed.    HIT6:59 Number of days in the last 4 weeks with:  Severe headache: 4 Moderate headache:8 Mild headache: 0 No headache: 16   Past Medical History  Diagnosis Date  . Short cervix in second trimester, antepartum 11/16/2013  . Migraines     a. x 10+ years.  . Postpartum care following cesarean delivery (5/29) 02/04/2014  . Anxiety   . Depression   . Pre-syncope     a. [facility], ? vasovagal, improved with IVF.    Social History   Social History  . Marital Status: Married    Spouse Name: N/A  . Number of Children: N/A  . Years of Education: N/A   Occupational History  . Not on file.   Social History Main Topics  . Smoking status: Never Smoker   . Smokeless tobacco: Never Used  . Alcohol Use: 0.0 oz/week    0 Standard drinks or equivalent per week     Comment: maybe two drinks/month.  . Drug Use: No  . Sexual Activity: Yes    Birth Control/ Protection: Implant   Other Topics Concern  . Not on file   Social History Narrative   [place]on[place] old d[place].  Does not routinely exercise.        Family History  Problem Relation Age of Onset   . Depression Mother   . Anxiety disorder Mother   . Heart attack Mother     NSTEMI @ age 31 . Marland Kitcheneart attack Maternal Grandfather   . Anxiety disorder Maternal Grandmother   . Depression Maternal Grandmother   . Diabetes Maternal Grandmother   . Hypertension Maternal Grandmother   . Alcohol abuse Paternal Grandfather   . Sleep apnea Father     alive @ 5653   Allergies  Allergen Reactions  . Sulfa Antibiotics   . Sulfur Hives    Current Outpatient Prescriptions on File Prior to Visit  Medication Sig Dispense Refill  . buPROPion (WELLBUTRIN XL) 300 MG 24 hr tablet Take 1 tablet (300 mg total) by mouth every morning. 30 tablet 3  . escitalopram (LEXAPRO) 10 MG tablet Take 1.5 tablets (15 mg total) by mouth daily. 45 tablet 3  . Hydrocodone-Acetaminophen 10-300 MG TABS Take 1-2 tablets by mouth every 6 (six) hours as needed. 30 each 0  . ibuprofen (ADVIL,MOTRIN) 800 MG tablet Take 800 mg by mouth every 8 (eight) hours as needed for headache.    . ondansetron (ZOFRAN ODT) 4 MG  disintegrating tablet Take 1 tablet (4 mg total) by mouth every 8 (eight) hours as needed for nausea or vomiting. 20 tablet 0  . propranolol (INDERAL) 10 MG tablet Take 10 mg by mouth 2 (two) times daily as needed.     . SUMAtriptan (IMITREX) 100 MG tablet Take 100 mg by mouth as needed.     . SUMAtriptan Succinate 4 MG/0.5ML SOAJ Inject 1 kit into the skin as needed (May repeat after 1 hour). 8 cartridge 1  . cyclobenzaprine (FLEXERIL) 10 MG tablet Take 1 tablet (10 mg total) by mouth every 8 (eight) hours as needed for muscle spasms. May use 1/2 tab (Patient not taking: Reported on 07/19/2015) 30 tablet 1  . promethazine (PHENERGAN) 25 MG tablet Take 1 tablet (25 mg total) by mouth every 8 (eight) hours as needed for nausea or vomiting (headache). (Patient not taking: Reported on 07/19/2015) 30 tablet 0   No current facility-administered medications on file prior to visit.     Review of Systems:  All  pertinent positive/negative included in HPI, all other review of systems are negative  Objective:  Physical Exam BP 118/86 mmHg  Pulse 85  Resp 18  Ht 5' 4"  (1.626 m)  Wt 138 lb (62.596 kg)  BMI 23.68 kg/m2  LMP 06/19/2015 (Within Weeks) CONSTITUTIONAL: Well-developed, well-nourished female in no acute distress.  EYES: EOM intact ENT: Normocephalic CARDIOVASCULAR: Regular rate and rhythm with no adventitious sounds.  RESPIRATORY: Normal rate. Clear to auscultation bilaterally.  ENDOCRINE: Normal thyroid.  MUSCULOSKELETAL: Normal ROM, strength equal bilaterally SKIN: Warm, dry without erythema  NEUROLOGICAL: Alert, oriented, CN II-XII grossly intact, Appropriate balance,Sensation equal bilaterally. PSYCH: Normal behavior, mood   Assessment & Plan:  Assessment: 1. Migraine without aura and without status migrainosus, not intractable    Plan: Continue Imitrex for acute migraine.   Discuss use of Effexor for migraine prevention with psychiatrist Do not restart propanolol with holter monitor Follow-up in 1 month a after getting results of cardiologic testing or sooner PRN  Paticia Stack, PA-C 07/24/2015 4:20 PM

## 2015-07-26 ENCOUNTER — Telehealth: Payer: Self-pay

## 2015-07-26 ENCOUNTER — Encounter: Payer: Self-pay | Admitting: *Deleted

## 2015-07-26 NOTE — Telephone Encounter (Signed)
Received notification from [employer] via email 11/16, 11:12pm of serious event. ST 150 at 3:44pm yesterday. S/w pt this morning who states she was in bed yesterday with a migraine. Reports little activity and no symptoms related to elevated HR.  Dr. Kirke CorinArida aware and reviewed report. No intervention at this time.

## 2015-08-09 ENCOUNTER — Other Ambulatory Visit: Payer: 59

## 2015-08-10 ENCOUNTER — Other Ambulatory Visit: Payer: Self-pay

## 2015-08-10 ENCOUNTER — Ambulatory Visit (INDEPENDENT_AMBULATORY_CARE_PROVIDER_SITE_OTHER): Payer: 59

## 2015-08-10 DIAGNOSIS — R55 Syncope and collapse: Secondary | ICD-10-CM

## 2015-08-27 ENCOUNTER — Encounter: Payer: Self-pay | Admitting: Cardiovascular Disease

## 2015-08-27 ENCOUNTER — Ambulatory Visit (INDEPENDENT_AMBULATORY_CARE_PROVIDER_SITE_OTHER): Payer: 59 | Admitting: Cardiovascular Disease

## 2015-08-27 VITALS — BP 110/80 | HR 98 | Ht 64.0 in | Wt 138.5 lb

## 2015-08-27 DIAGNOSIS — R000 Tachycardia, unspecified: Secondary | ICD-10-CM | POA: Diagnosis not present

## 2015-08-27 NOTE — Progress Notes (Signed)
Patient ID: Candice [name], female    DOB: Dec 15, 1985, 29 y.o.   MRN: [id number]  HPI Comments: 29 year old female with a history of migraines, family history of coronary artery disease,  episodes of lightheadedness associated at rest with nausea, diaphoresis dating back to her 83s, several recent episodes of lightheadedness while she was a passenger in a car after taking propranolol 20 mg for migraines, presents for follow-up of her lightheaded episodes and to go over her monitor results. 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 At that time she reports that she was in bed, sleeping, had high-grade, symptoms were starting to get better Other episode of sinus tachycardia with rate of 100, no symptoms at that time   otherwise she denies any further episodes  Other past medical history Previous episode of lightheadedness with heart rate in the 1865H systolic pressure 1846NFelt nauseated, diaphoretic, weak Taken to the emergency room, ECG was nonacute. Head CT was normal. Vital signs are stable and after being treated with IV fluids, she had significant improvement in symptoms     Allergies  Allergen Reactions  . Sulfa Antibiotics   . Sulfur Hives    Current Outpatient Prescriptions on File Prior to Visit  Medication Sig Dispense Refill  . buPROPion (WELLBUTRIN XL) 300 MG 24 hr tablet Take 1 tablet (300 mg total) by mouth every morning. 30 tablet 3  . cyclobenzaprine (FLEXERIL) 10 MG tablet Take 1 tablet (10 mg total) by mouth every 8 (eight) hours as needed for muscle spasms. May use 1/2 tab 30 tablet 1  . escitalopram (LEXAPRO) 10 MG tablet Take 1.5 tablets (15 mg total) by mouth daily. 45 tablet 3  . Hydrocodone-Acetaminophen 10-300 MG TABS Take 1-2 tablets by mouth every 6 (six) hours as needed. 30 each 0  . ibuprofen (ADVIL,MOTRIN) 800 MG tablet  Take 800 mg by mouth every 8 (eight) hours as needed for headache.    . ondansetron (ZOFRAN ODT) 4 MG disintegrating tablet Take 1 tablet (4 mg total) by mouth every 8 (eight) hours as needed for nausea or vomiting. 20 tablet 0  . promethazine (PHENERGAN) 25 MG tablet Take 1 tablet (25 mg total) by mouth every 8 (eight) hours as needed for nausea or vomiting (headache). 30 tablet 0  . propranolol (INDERAL) 10 MG tablet Take 10 mg by mouth 2 (two) times daily as needed.     . SUMAtriptan (IMITREX) 100 MG tablet Take 1 tablet (100 mg total) by mouth as needed. 12 tablet 11  . SUMAtriptan Succinate 4 MG/0.5ML SOAJ Inject 1 kit into the skin as needed (May repeat after 1 hour). 8 cartridge 1   No current facility-administered medications on file prior to visit.    Past Medical History  Diagnosis Date  . Short cervix in second trimester, antepartum 11/16/2013  . Migraines     a. x 10+ years.  . Postpartum care following cesarean delivery (5/29) 02/04/2014  . Anxiety   . Depression   . Pre-syncope     [facility], improved with [facility].    Past Surgical History  Procedure Laterality Date  . Wisdom tooth extraction  2002  . Cesarean section N/A 02/03/2014    Procedure: Primary Cesarean Section Delivery Baby Girl @ 26295 Apgars 9/9;  Surgeon: SMarvene Staff MD;  [facility];  Service: Obstetrics;  Laterality: N/A;  . Cesarean section      Social History  reports that she has never smoked. She has never used smokeless tobacco. She reports that she drinks alcohol. She reports that she does not use illicit drugs.  Family History family history includes Alcohol abuse in her paternal grandfather; Anxiety disorder in her maternal grandmother and mother; Depression in her maternal grandmother and mother; Diabetes in her maternal grandmother; Heart attack in her maternal grandfather and mother; Hypertension in her maternal grandmother; Sleep apnea in her  father.   Review of Systems  Constitutional: Negative.   Respiratory: Negative.   Cardiovascular: Negative.   Gastrointestinal: Negative.   Musculoskeletal: Negative.   Neurological: Positive for light-headedness.  Hematological: Negative.   Psychiatric/Behavioral: Negative.   All other systems reviewed and are negative.   BP 110/80 mmHg  Pulse 98  Ht 5' 4"  (1.626 m)  Wt 138 lb 8 oz (62.823 kg)  BMI 23.76 kg/m2  Physical Exam  Constitutional: She is oriented to person, place, and time. She appears well-developed and well-nourished.  HENT:  Head: Normocephalic.  Nose: Nose normal.  Mouth/Throat: Oropharynx is clear and moist.  Eyes: Conjunctivae are normal. Pupils are equal, round, and reactive to light.  Neck: Normal range of motion. Neck supple. No JVD present.  Cardiovascular: Normal rate, regular rhythm, normal heart sounds and intact distal pulses.  Exam reveals no gallop and no friction rub.   No murmur heard. Pulmonary/Chest: Effort normal and breath sounds normal. No respiratory distress. She has no wheezes. She has no rales. She exhibits no tenderness.  Abdominal: Soft. Bowel sounds are normal. She exhibits no distension. There is no tenderness.  Musculoskeletal: Normal range of motion. She exhibits no edema or tenderness.  Lymphadenopathy:    She has no cervical adenopathy.  Neurological: She is alert and oriented to person, place, and time. Coordination normal.  Skin: Skin is warm and dry. No rash noted. No erythema.  Psychiatric: She has a normal mood and affect. Her behavior is normal. Judgment and thought content normal.

## 2015-08-27 NOTE — Patient Instructions (Signed)
You are doing well. No medication changes were made.  Please monitor your heart rate and blood pressure when you have lightheaded episodes  Please call us if you have new issues that need to be addressed before your next appt.

## 2015-08-27 NOTE — Assessment & Plan Note (Addendum)
Elevated heart rate up to 150 bpm seen on monitor November16 2016, patient reports she was in bed at the time though was getting over a migraine. No other significant arrhythmias noted. Recommend we continue to monitor her symptoms. No further episodes of lightheadedness. Recommended she stay hydrated. If she does have an episode, suggested she monitor blood pressure, heart rate, lay in a supine position. Difficult to use propranolol as needed given low blood pressure, scarcity of events. Would not start a medication on a daily basis given the above. No further workup needed at this time Echocardiogram reviewed with her showing normal ejection fraction

## 2015-08-28 ENCOUNTER — Encounter: Payer: Self-pay | Admitting: Physician Assistant

## 2015-08-28 ENCOUNTER — Ambulatory Visit (INDEPENDENT_AMBULATORY_CARE_PROVIDER_SITE_OTHER): Payer: 59 | Admitting: Physician Assistant

## 2015-08-28 VITALS — BP 119/82 | HR 105 | Resp 18 | Wt 138.0 lb

## 2015-08-28 DIAGNOSIS — G4489 Other headache syndrome: Secondary | ICD-10-CM

## 2015-08-28 DIAGNOSIS — G43009 Migraine without aura, not intractable, without status migrainosus: Secondary | ICD-10-CM

## 2015-08-28 MED ORDER — SUMATRIPTAN SUCCINATE 6 MG/0.5ML ~~LOC~~ SOAJ: 1.0000 | SUBCUTANEOUS | Status: DC | PRN

## 2015-08-28 MED ORDER — HYDROCODONE-ACETAMINOPHEN 10-300 MG PO TABS: 1.0000 | ORAL_TABLET | Freq: Four times a day (QID) | ORAL | Status: DC | PRN

## 2015-08-28 MED ORDER — NAPROXEN SODIUM 550 MG PO TABS: 550.0000 mg | ORAL_TABLET | ORAL | Status: DC | PRN

## 2015-08-28 MED ORDER — SUMATRIPTAN SUCCINATE 100 MG PO TABS: 100.0000 mg | ORAL_TABLET | ORAL | Status: DC | PRN

## 2015-08-28 NOTE — Patient Instructions (Signed)

## 2015-08-28 NOTE — Progress Notes (Signed)
Patient ID: Candice [name], female   DOB: Mar 29, 1986, 29 y.o.   MRN: [id number] History:  Candice [name] is a 29 y.o. G1P1001 who presents to clinic today for headache followup.  Had severe HA.  Took Vicodin during the night.  No help.  In AM, took Imitrex tablet and ibuprofen at 8am.  HA still at noon - took injectable imitrex.  4 hours later she took ibuprofen.  Woke the next morning feeling better but HA came on again mid-day.  Imitrex with phenergan that following day worked after napping.   Cardiologist was monitoring on Holter = after using the injectable imitrex, her heart rate was increased to 150bpm.  She was asleep at that time and it was roughly 3.5 hours after using.   She has had to miss 3 days of work in the last month.   Another day, she was able to use medicine one time and that took care of the pain.      HIT6:61   Past Medical History  Diagnosis Date  . Short cervix in second trimester, antepartum 11/16/2013  . Migraines     a. x 10+ years.  . Postpartum care following cesarean delivery (5/29) 02/04/2014  . Anxiety   . Depression   . Pre-syncope     a. 07/2015 [facility] ED visit, ? vasovagal, improved with IVF.    Social History   Social History  . Marital Status: Married    Spouse Name: N/A  . Number of Children: N/A  . Years of Education: N/A   Occupational History  . Not on file.   Social History Main Topics  . Smoking status: Never Smoker   . Smokeless tobacco: Never Used  . Alcohol Use: 0.0 oz/week    0 Standard drinks or equivalent per week     Comment: maybe two drinks/month.  . Drug Use: No  . Sexual Activity: Yes    Birth Control/ Protection: Implant   Other Topics Concern  . Not on file   Social History Narrative   Lives in [place] with her husband and 16 month old dtr.  Does not routinely exercise.        Family History  Problem Relation Age of Onset  . Depression Mother   . Anxiety disorder Mother   . Heart attack Mother     NSTEMI  @ age 75.  [name] Heart attack Maternal Grandfather   . Anxiety disorder Maternal Grandmother   . Depression Maternal Grandmother   . Diabetes Maternal Grandmother   . Hypertension Maternal Grandmother   . Alcohol abuse Paternal Grandfather   . Sleep apnea Father     alive @ 25.    Allergies  Allergen Reactions  . Sulfa Antibiotics   . Sulfur Hives    Current Outpatient Prescriptions on File Prior to Visit  Medication Sig Dispense Refill  . buPROPion (WELLBUTRIN XL) 300 MG 24 hr tablet Take 1 tablet (300 mg total) by mouth every morning. 30 tablet 3  . cyclobenzaprine (FLEXERIL) 10 MG tablet Take 1 tablet (10 mg total) by mouth every 8 (eight) hours as needed for muscle spasms. May use 1/2 tab 30 tablet 1  . escitalopram (LEXAPRO) 10 MG tablet Take 1.5 tablets (15 mg total) by mouth daily. 45 tablet 3  . Hydrocodone-Acetaminophen 10-300 MG TABS Take 1-2 tablets by mouth every 6 (six) hours as needed. 30 each 0  . ibuprofen (ADVIL,MOTRIN) 800 MG tablet Take 800 mg by mouth every 8 (eight) hours  as needed for headache.    . ondansetron (ZOFRAN ODT) 4 MG disintegrating tablet Take 1 tablet (4 mg total) by mouth every 8 (eight) hours as needed for nausea or vomiting. 20 tablet 0  . promethazine (PHENERGAN) 25 MG tablet Take 1 tablet (25 mg total) by mouth every 8 (eight) hours as needed for nausea or vomiting (headache). 30 tablet 0  . propranolol (INDERAL) 10 MG tablet Take 10 mg by mouth 2 (two) times daily as needed.     . SUMAtriptan (IMITREX) 100 MG tablet Take 1 tablet (100 mg total) by mouth as needed. 12 tablet 11  . SUMAtriptan Succinate 4 MG/0.5ML SOAJ Inject 1 kit into the skin as needed (May repeat after 1 hour). 8 cartridge 1   No current facility-administered medications on file prior to visit.     Review of Systems:  All pertinent positive/negative included in HPI, all other review of systems are negative  Objective:  Physical Exam BP 119/82 mmHg  Pulse 105  Resp 18   Wt 138 lb (62.596 kg) CONSTITUTIONAL: Well-developed, well-nourished female in no acute distress.  EYES: EOM intact ENT: Normocephalic CARDIOVASCULAR: Regular rate and rhythm with no adventitious sounds.  RESPIRATORY: Normal rate. Clear to auscultation bilaterally.  ENDOCRINE: Normal thyroid.  MUSCULOSKELETAL: Normal ROM, strength equal bilaterally, significant bilateral trapezius muscle spasm noted as well as cervical paraspinal SKIN: Warm, dry without erythema  NEUROLOGICAL: Alert, oriented, CN II-XII grossly intact, Appropriate balance   PSYCH: Normal behavior, mood   Assessment & Plan:  Assessment: Migraine without aura - not improving.    Plan: Will increase Imitrex SQ to 39m autoinjector SAdleighwill talk to her psychiatrist to consider Effexor in place of Lexapro Will trial Flexeril (Previously prescribed but never taken).  Use for mild HA.   Anaprox - can use for mild HA or with Imitrex Phenergan  - for nausea/ HA rescue.  Follow-up in 3 months or sooner PRN  KPaticia Stack PA-C 08/28/2015 3:52 PM

## 2015-09-11 DIAGNOSIS — G43709 Chronic migraine without aura, not intractable, without status migrainosus: Secondary | ICD-10-CM | POA: Insufficient documentation

## 2015-09-11 DIAGNOSIS — G43009 Migraine without aura, not intractable, without status migrainosus: Secondary | ICD-10-CM | POA: Insufficient documentation

## 2015-09-19 ENCOUNTER — Ambulatory Visit: Payer: 59 | Admitting: Cardiovascular Disease

## 2015-09-24 ENCOUNTER — Telehealth: Payer: Self-pay | Admitting: Physician Assistant

## 2015-09-24 NOTE — Telephone Encounter (Signed)
Recommended a 3 month f/u headache appt back in December, March schedule has become available, called patient to try and schedule appt, no answer, left voice mail instructing her to return my call here at the office.

## 2015-10-17 ENCOUNTER — Encounter: Payer: Self-pay | Admitting: Psychiatry

## 2015-10-17 ENCOUNTER — Ambulatory Visit (INDEPENDENT_AMBULATORY_CARE_PROVIDER_SITE_OTHER): Payer: 59 | Admitting: Psychiatry

## 2015-10-17 VITALS — BP 110/82 | HR 83 | Temp 98.7°F | Wt 142.6 lb

## 2015-10-17 DIAGNOSIS — F331 Major depressive disorder, recurrent, moderate: Secondary | ICD-10-CM | POA: Diagnosis not present

## 2015-10-17 MED ORDER — BUPROPION HCL ER (XL) 300 MG PO TB24: 300.0000 mg | ORAL_TABLET | ORAL | Status: DC

## 2015-10-17 MED ORDER — VENLAFAXINE HCL ER 37.5 MG PO CP24: ORAL_CAPSULE | ORAL | Status: DC

## 2015-10-17 MED ORDER — ALPRAZOLAM 0.25 MG PO TABS: 0.2500 mg | ORAL_TABLET | Freq: Every day | ORAL | Status: DC | PRN

## 2015-10-17 NOTE — Progress Notes (Signed)
BH MD/PA/NP OP Progress Note  10/17/2015 12:24 PM Candice [name]  MRN:  [id number]  Subjective:  Patient returns for follow-up of her major depressive disorder. Patient indicates that one of her concerns today is weight gain. It does appear that back in June 2016 she weighed approximately 120 pounds and today she is now 142 pounds. She does feel overall that her medications have helped keep her mood stable and she wonders whether are contributing to any of her weight gain. Of note when she initially presented to this clinic she was only on Wellbutrin and we have subsequently added Lexapro. Patient reports overall doing well on these medications. She also had contact with her headache doctor who mentioned that Effexor can sometimes be helpful for her migraine headaches. 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  She has had some episodes that sound they might be mild panic attacks or she becomes short of breath perhaps even dizzy. She wonders whether the symptoms she was having that were syncope-like were more anxiety. She states all of her tests from that workup were normal.  She states she continues to work and things are going fairly well there.  Chief Complaint: weight Chief Complaint    Follow-up; Medication Refill; Anxiety; Fatigue    I am able to function Visit Diagnosis:     ICD-9-CM ICD-10-CM   1. Major depressive disorder, recurrent episode, moderate (HCC) 296.32 F33.1     Past Medical History:  Past Medical History  Diagnosis Date  . Short cervix in second trimester, antepartum 11/16/2013  . Migraines     a. x 10+ years.  . Postpartum care following cesarean delivery (5/29) 02/04/2014  . Anxiety   . Depression   . Pre-syncope     a. 07/2015 [facility] ED visit, ? vasovagal, improved with IVF.    Past Surgical History  Procedure Laterality Date  . Wisdom tooth extraction  2002  . Cesarean section N/A 02/03/2014    Procedure: Primary Cesarean Section Delivery Baby Girl @ 2244,  Apgars 9/9;  Surgeon: Serita Kyle, MD;  Location: [facility];  Service: Obstetrics;  Laterality: N/A;  . Cesarean section     Family History:  Family History  Problem Relation Age of Onset  . Depression Mother   . Anxiety disorder Mother   . Heart attack Mother     NSTEMI @ age 79.  [name] Heart attack Maternal Grandfather   . Anxiety disorder Maternal Grandmother   . Depression Maternal Grandmother   . Diabetes Maternal Grandmother   . Hypertension Maternal Grandmother   . Alcohol abuse Paternal Grandfather   . Sleep apnea Father     alive @ 33.   Social History:  Social History   Social History  . Marital Status: Married    Spouse Name: N/A  . Number of Children: N/A  . Years of Education: N/A   Social History Main Topics  . Smoking status: Never Smoker   . Smokeless tobacco: Never Used  . Alcohol Use: 0.0 oz/week    0 Standard drinks or equivalent per week     Comment: maybe two drinks/month.  . Drug Use: No  . Sexual Activity: Yes    Birth Control/ Protection: Implant   Other Topics Concern  . None   Social History Narrative   Lives in [place] with her husband and 33 month old dtr.  Does not routinely exercise.       Additional History:   Assessment:   Musculoskeletal: Strength &  Muscle Tone: within normal limits Gait & Station: normal Patient leans: N/A  Psychiatric Specialty Exam: Anxiety Symptoms include nervous/anxious behavior. Patient reports no insomnia or suicidal ideas.      Review of Systems  Psychiatric/Behavioral: Negative for depression, suicidal ideas, hallucinations, memory loss and substance abuse. The patient is nervous/anxious. The patient does not have insomnia.        Weight gain over past several months  All other systems reviewed and are negative.   Blood pressure 110/82, pulse 83, temperature 98.7 F (37.1 C), temperature source Tympanic, weight 142 lb 9.6 oz (64.683 kg), SpO2 99 %, currently breastfeeding.Body mass index  is 24.47 kg/(m^2).  General Appearance: Neat and Well Groomed  Eye Contact:  Good  Speech:  Clear and Coherent and Normal Rate  Volume:  Normal  Mood:  Okay  Affect:  Bright smile  Thought Process:  Linear and Logical  Orientation:  Full (Time, Place, and Person)  Thought Content:  Negative  Suicidal Thoughts:  No  Homicidal Thoughts:  No  Memory:  Immediate;   Good Recent;   Good Remote;   Good  Judgement:  Good  Insight:  Good  Psychomotor Activity:  Negative  Concentration:  Good  Recall:  Good  Fund of Knowledge: Good  Language: Good  Akathisia:  Negative  Handed:  Right unknown  AIMS (if indicated):  Not done  Assets:  Communication Skills Desire for Improvement Social Support Vocational/Educational  ADL's:  Intact  Cognition: WNL  Sleep:  Good, but states that the baby can interrupt her sleep pattern.    Is the patient at risk to self?  No. Has the patient been a risk to self in the past 6 months?  No. Has the patient been a risk to self within the distant past?  No. Is the patient a risk to others?  No. Has the patient been a risk to others in the past 6 months?  No. Has the patient been a risk to others within the distant past?  No.  Current Medications: Current Outpatient Prescriptions  Medication Sig Dispense Refill  . buPROPion (WELLBUTRIN XL) 300 MG 24 hr tablet Take 1 tablet (300 mg total) by mouth every morning. 30 tablet 3  . Hydrocodone-Acetaminophen 10-300 MG TABS Take 1-2 tablets by mouth every 6 (six) hours as needed. 30 each 0  . naproxen sodium (ANAPROX DS) 550 MG tablet Take 1 tablet (550 mg total) by mouth as needed for headache. 60 tablet 1  . ondansetron (ZOFRAN ODT) 4 MG disintegrating tablet Take 1 tablet (4 mg total) by mouth every 8 (eight) hours as needed for nausea or vomiting. 20 tablet 0  . propranolol (INDERAL) 10 MG tablet Take 10 mg by mouth 2 (two) times daily as needed.     . SUMAtriptan (IMITREX) 100 MG tablet Take 1 tablet (100  mg total) by mouth as needed. 12 tablet 11  . SUMAtriptan 6 MG/0.5ML SOAJ Inject 1 cartridge into the skin as needed (migraine). May repeat in 2 hours.  No more than 2 doses of Imitrex per day. 12 cartridge 3  . ALPRAZolam (XANAX) 0.25 MG tablet Take 1 tablet (0.25 mg total) by mouth daily as needed for anxiety. 30 tablet 1  . venlafaxine XR (EFFEXOR XR) 37.5 MG 24 hr capsule One tablet in the morning for one week and then increase to two tablets in the morning. 60 capsule 1   No current facility-administered medications for this visit.    Medical Decision Making:  Established Problem, Stable/Improving (1) patient indicates some improvement on medication but wonders whether she could feel even better with a higher dose. He is denying any side effects on the Lexapro.  Treatment Plan Summary:Medication management Major depressive disorder, recurrent, moderate Continue   Wellbutrin XL 300 mg daily. We decided that given that she might benefit from headache prevention and that she's had some weight gain since being on the Lexapro that we would try to take her off of her Lexapro. Given that she is artery been on Wellbutrin alone and we had to add something we are going to go ahead and start some Effexor XR 37.5 mg for 7 days and then she will increase to 75 mg in the morning. Risk and benefits of been discussing patient's able to consent.  Other anxiety disorder-we'll start some alprazolam 0.25 mg daily as needed for anxiety/panic. Risk and benefits of been discussing patient's able to consent.  Patient will follow up in 1 month. He is aware she'll be seeing another provider in this clinic. She did discuss that she might want to transfer care to Dr. Maryruth Bun, she is aware there might be a wait list for that. She was somewhat frustrated with not getting earlier notification of this writer's departure. Occurs to call any questions or concerns.  Wallace Going 10/17/2015, 12:24 PM

## 2015-11-09 NOTE — Progress Notes (Signed)
Per the last office note of dr. Mayford KnifeWilliams he decided that given that she might benefit from headache prevention and that she's had some weight gain since being on the Lexapro that we would try to take her off of her Lexapro.

## 2015-11-09 NOTE — Progress Notes (Signed)
Per pt no longer taking.

## 2015-11-15 ENCOUNTER — Encounter: Payer: Self-pay | Admitting: Psychiatry

## 2015-11-15 ENCOUNTER — Ambulatory Visit (INDEPENDENT_AMBULATORY_CARE_PROVIDER_SITE_OTHER): Payer: 59 | Admitting: Psychiatry

## 2015-11-15 DIAGNOSIS — F331 Major depressive disorder, recurrent, moderate: Secondary | ICD-10-CM

## 2015-11-15 MED ORDER — ESCITALOPRAM OXALATE 10 MG PO TABS: 15.0000 mg | ORAL_TABLET | Freq: Every day | ORAL | Status: DC

## 2015-11-15 NOTE — Progress Notes (Signed)
Patient ID: Chapman FitchStephanie T Mccambridge, female   DOB: 11/02/1985, 30 y.o.   [facility] [id number] [facility]  11/15/2015 11:04 AM Chapman FitchStephanie T Bold  MRN:  [id number]  Subjective:  Patient returns for follow-up of her major depressive disorder. Patient was previously seen by Dr. Mayford KnifeWilliams. This is the first visit for this patient with this clinician. At her previous visit patient was started on Effexor and titrated up to 75 mg. She was also discontinued on the Lexapro. However patient today reports that she did not start the Effexor and continued on the Lexapro. She stated that since it was Dr. Mayford KnifeWilliams last day she did not want to start on a new medication. Reports that her depression is under control and she is functioning well at her job. States that she sleeps well most nights and seems to have a larger appetite since she had the baby. Anxiety and is not sure what triggers it. Discussed with patient that Wellbutrin could be contributing to some of the anxiety and since it has not been helpful with her depression we will cut it into half and monitor her. Patient agreeable to this plan. She states that she has just taken the Xanax 1-2 times and it did not really help with the anxiety. She states she continues to work and things are going fairly well there.  Chief Complaint: weight I am able to function Visit Diagnosis:   No diagnosis found.  Past Medical History:  Past Medical History  Diagnosis Date  . Short cervix in second trimester, antepartum 11/16/2013  . Migraines     a. x 10+ years.  . Postpartum care following cesarean delivery (5/29) 02/04/2014  . Anxiety   . Depression   . Pre-syncope     a. 07/2015 [facility] ED visit, ? vasovagal, improved with IVF.    Past Surgical History  Procedure Laterality Date  . Wisdom tooth extraction  2002  . Cesarean section N/A 02/03/2014    Procedure: Primary Cesarean Section Delivery Baby Girl @ 2244, Apgars 9/9;  Surgeon: Serita KyleSheronette A Cousins, MD;   Location: [facility];  Service: Obstetrics;  Laterality: N/A;  . Cesarean section     Family History:  Family History  Problem Relation Age of Onset  . Depression Mother   . Anxiety disorder Mother   . Heart attack Mother     NSTEMI @ age 32.  [name]. Heart attack Maternal Grandfather   . Anxiety disorder Maternal Grandmother   . Depression Maternal Grandmother   . Diabetes Maternal Grandmother   . Hypertension Maternal Grandmother   . Alcohol abuse Paternal Grandfather   . Sleep apnea Father     alive @ 3232.   Social History:  Social History   Social History  . Marital Status: Married    Spouse Name: N/A  . Number of Children: N/A  . Years of Education: N/A   Social History Main Topics  . Smoking status: Never Smoker   . Smokeless tobacco: Never Used  . Alcohol Use: 0.0 oz/week    0 Standard drinks or equivalent per week     Comment: maybe two drinks/month.  . Drug Use: No  . Sexual Activity: Yes    Birth Control/ Protection: Implant   Other Topics Concern  . Not on file   Social History Narrative   Lives in [place] with her husband and 1817 month old dtr.  Does not routinely exercise.       Additional History:   Assessment:  Musculoskeletal: Strength & Muscle Tone: within normal limits Gait & Station: normal Patient leans: N/A  Psychiatric Specialty Exam: Anxiety Symptoms include nervous/anxious behavior. Patient reports no insomnia or suicidal ideas.      Review of Systems  Psychiatric/Behavioral: Negative for depression, suicidal ideas, hallucinations, memory loss and substance abuse. The patient is nervous/anxious. The patient does not have insomnia.        Weight gain over past several months  All other systems reviewed and are negative.   currently breastfeeding.There is no weight on file to calculate BMI.  General Appearance: Neat and Well Groomed  Eye Contact:  Good  Speech:  Clear and Coherent and Normal Rate  Volume:  Normal  Mood:  Okay  Affect:   Bright smile  Thought Process:  Linear and Logical  Orientation:  Full (Time, Place, and Person)  Thought Content:  Negative  Suicidal Thoughts:  No  Homicidal Thoughts:  No  Memory:  Immediate;   Good Recent;   Good Remote;   Good  Judgement:  Good  Insight:  Good  Psychomotor Activity:  Negative  Concentration:  Good  Recall:  Good  Fund of Knowledge: Good  Language: Good  Akathisia:  Negative  Handed:  Right unknown  AIMS (if indicated):  Not done  Assets:  Communication Skills Desire for Improvement Social Support Vocational/Educational  ADL's:  Intact  Cognition: WNL  Sleep:  Good, but states that the baby can interrupt her sleep pattern.    Is the patient at risk to self?  No. Has the patient been a risk to self in the past 6 months?  No. Has the patient been a risk to self within the distant past?  No. Is the patient a risk to others?  No. Has the patient been a risk to others in the past 6 months?  No. Has the patient been a risk to others within the distant past?  No.  Current Medications: Current Outpatient Prescriptions  Medication Sig Dispense Refill  . ALPRAZolam (XANAX) 0.25 MG tablet Take 1 tablet (0.25 mg total) by mouth daily as needed for anxiety. 30 tablet 1  . buPROPion (WELLBUTRIN XL) 300 MG 24 hr tablet Take 1 tablet (300 mg total) by mouth every morning. 30 tablet 3  . Hydrocodone-Acetaminophen 10-300 MG TABS Take 1-2 tablets by mouth every 6 (six) hours as needed. 30 each 0  . naproxen sodium (ANAPROX DS) 550 MG tablet Take 1 tablet (550 mg total) by mouth as needed for headache. 60 tablet 1  . ondansetron (ZOFRAN ODT) 4 MG disintegrating tablet Take 1 tablet (4 mg total) by mouth every 8 (eight) hours as needed for nausea or vomiting. 20 tablet 0  . propranolol (INDERAL) 10 MG tablet Take 10 mg by mouth 2 (two) times daily as needed.     . SUMAtriptan (IMITREX) 100 MG tablet Take 1 tablet (100 mg total) by mouth as needed. 12 tablet 11  .  SUMAtriptan 6 MG/0.5ML SOAJ Inject 1 cartridge into the skin as needed (migraine). May repeat in 2 hours.  No more than 2 doses of Imitrex per day. 12 cartridge 3  . venlafaxine XR (EFFEXOR XR) 37.5 MG 24 hr capsule One tablet in the morning for one week and then increase to two tablets in the morning. 60 capsule 1   No current facility-administered medications for this visit.    Medical Decision Making:  Established Problem, Stable/Improving (1) patient indicates some improvement on medication but wonders whether she could feel  even better with a higher dose. He is denying any side effects on the Lexapro.  Treatment Plan Summary:Medication management Major depressive disorder, recurrent, moderate In remission Continue Lexapro at 15 mg once daily Decrease Wellbutrin to 150 mg once daily  Other anxiety disorder- alprazolam 0.25 mg daily as needed for anxiety/panic. Risk and benefits of been discussing patient's able to consent.  Patient will follow up in 1 month or call before if needed  .Victoire Deans 11/15/2015, 11:04 AM

## 2015-11-23 ENCOUNTER — Ambulatory Visit (INDEPENDENT_AMBULATORY_CARE_PROVIDER_SITE_OTHER): Payer: 59 | Admitting: Physician Assistant

## 2015-11-23 VITALS — BP 121/83 | HR 96 | Ht 64.0 in | Wt 144.0 lb

## 2015-11-23 DIAGNOSIS — G43001 Migraine without aura, not intractable, with status migrainosus: Secondary | ICD-10-CM | POA: Diagnosis not present

## 2015-11-23 DIAGNOSIS — M62838 Other muscle spasm: Secondary | ICD-10-CM | POA: Diagnosis not present

## 2015-11-23 DIAGNOSIS — G43009 Migraine without aura, not intractable, without status migrainosus: Secondary | ICD-10-CM | POA: Diagnosis not present

## 2015-11-23 MED ORDER — HYDROCODONE-ACETAMINOPHEN 10-300 MG PO TABS: 1.0000 | ORAL_TABLET | Freq: Four times a day (QID) | ORAL | Status: DC | PRN

## 2015-11-23 MED ORDER — ONDANSETRON 4 MG PO TBDP: 4.0000 mg | ORAL_TABLET | Freq: Three times a day (TID) | ORAL | Status: DC | PRN

## 2015-11-23 MED ORDER — TOPIRAMATE 25 MG PO TABS: 25.0000 mg | ORAL_TABLET | Freq: Two times a day (BID) | ORAL | Status: DC

## 2015-11-23 NOTE — Patient Instructions (Signed)

## 2015-11-23 NOTE — Progress Notes (Signed)
Here today for headache follow up, does have a headache today.  History:  Candice [name] is a 30 y.o. G1P1001 who presents to clinic today for followup of migraine headaches and treatment for present HA.  She notes her HA's are getting worse.  She presently has had a HA for over one week and it won't seem to go away.  She has not yet used to  autoinjector of Imitrex.  She has used the occasional flexeril at  and that has been some help for muscle spasm but not HA.  She was forced to use a new psychiatrist after her old one left the practice.  Her new psychiatrist was disagreeable to change her medication over to Effexor.  The earlier plan had been to d/c Lexapro in favor of Effexor - which would treat depression and migraine HA's concurrently.  Candice [name] does not want to upset her new psychiatrist.  However, she does need migraine prevention.    HIT6:64   Past Medical History  Diagnosis Date  . Short cervix in second trimester, antepartum 11/16/2013  . Migraines     a. x 10+ years.  . Postpartum care following cesarean delivery (5/29) 02/04/2014  . Anxiety   . Depression   . Pre-syncope     a. 07/2015 [facility] ED visit, ? vasovagal, improved with IVF.    Social History   Social History  . Marital Status: Married    Spouse Name: N/A  . Number of Children: N/A  . Years of Education: N/A   Occupational History  . Not on file.   Social History Main Topics  . Smoking status: Never Smoker   . Smokeless tobacco: Never Used  . Alcohol Use: 0.0 oz/week    0 Standard drinks or equivalent per week     Comment: maybe two drinks/month.  . Drug Use: No  . Sexual Activity: Yes    Birth Control/ Protection: Implant   Other Topics Concern  . Not on file   Social History Narrative   Lives in [place] with her husband and 67 month old dtr.  Does not routinely exercise.        Family History  Problem Relation Age of Onset  . Depression Mother   . Anxiety disorder Mother   .  Heart attack Mother     NSTEMI @ age 34.  [name] Heart attack Maternal Grandfather   . Anxiety disorder Maternal Grandmother   . Depression Maternal Grandmother   . Diabetes Maternal Grandmother   . Hypertension Maternal Grandmother   . Alcohol abuse Paternal Grandfather   . Sleep apnea Father     alive @ 53.    Allergies  Allergen Reactions  . Sulfa Antibiotics Other (See Comments)  . Sulfur Hives    Current Outpatient Prescriptions on File Prior to Visit  Medication Sig Dispense Refill  . escitalopram (LEXAPRO) 10 MG tablet Take 1.5 tablets (15 mg total) by mouth daily. 45 tablet 1  . Hydrocodone-Acetaminophen 10-300 MG TABS Take 1-2 tablets by mouth every 6 (six) hours as needed. 30 each 0  . naproxen sodium (ANAPROX DS) 550 MG tablet Take 1 tablet (550 mg total) by mouth as needed for headache. 60 tablet 1  . ondansetron (ZOFRAN ODT) 4 MG disintegrating tablet Take 1 tablet (4 mg total) by mouth every 8 (eight) hours as needed for nausea or vomiting. 20 tablet 0  . SUMAtriptan (IMITREX) 100 MG tablet Take 1 tablet (100 mg total) by mouth as needed. 12  tablet 11  . SUMAtriptan 6 MG/0.5ML SOAJ Inject 1 cartridge into the skin as needed (migraine). May repeat in 2 hours.  No more than 2 doses of Imitrex per day. 12 cartridge 3  . ALPRAZolam (XANAX) 0.25 MG tablet Take 1 tablet (0.25 mg total) by mouth daily as needed for anxiety. (Patient not taking: Reported on 11/23/2015) 30 tablet 1   No current facility-administered medications on file prior to visit.     Review of Systems:  All pertinent positive/negative included in HPI, all other review of systems are negative  Objective:  Physical Exam BP 121/83 mmHg  Pulse 96  Ht 5\' 4"  (1.626 m)  Wt 144 lb (65.318 kg)  BMI 24.71 kg/m2  LMP 11/12/2015 CONSTITUTIONAL: Well-developed, well-nourished female in no acute distress although she looks like she is in pain. EYES: EOM intact - eye lids droopy bilaterally ENT:  Normocephalic CARDIOVASCULAR: Regular rate and rhythm with no adventitious sounds.  RESPIRATORY: Normal rate. Clear to auscultation bilaterally.  ENDOCRINE: Normal thyroid.  MUSCULOSKELETAL: Normal ROM, strength equal bilaterally, cervical paraspinal muscle spasm noted as well as spasm in bilat trapezius muscles SKIN: Warm, dry without erythema  NEUROLOGICAL: Alert, oriented, CN II-XII grossly intact, Appropriate balance  PSYCH: Normal behavior, mood  PROCEDURE:  TRIGGER POINT INJECTIONS/Occipital Nerve Block  Procedure: Mixture of 1%  Lidocaine, marcaine and dexamethazone in a ratio of 2:2:1  injected with 1cc each site in bilateral greater Occipital Nerves, bilateral lesser occipital nerves, bilateral cervical paraspinal muscles, bilateral trapezius.  Total amount injected: 10cc.  Pt tolerated procedure well and noted improvement in pain score immediately following the procedure.  Assessment & Plan:  Assessment: Status migraine - new and worsened condition Migraine without aura - worse  - requires prevention as well as abortive therapy Muscle spasm - continued without improvement   Plan: TPI/OB for acute status migraine today Will begin Topamax for prevention.  Begin with 25mg  nightly for one week and increase weekly by 25mg  as tolerated up to 75mg .   She also has weight concerns and Topamax may help.  Flexeril this evening to further assist with the spasm/acute migraine Imitrex 6mg  SQ auto injector for severe/awakening migraine.  Follow-up in 2 months or sooner PRN  Bertram DenverKaren E Teague Clark, PA-C 11/23/2015 8:30 AM

## 2015-11-28 ENCOUNTER — Telehealth: Payer: Self-pay | Admitting: *Deleted

## 2015-11-28 NOTE — Telephone Encounter (Signed)
Candice [name] from [facility] pharmacy wanting to verify frequency use of Topamax, informed him of directions per Clydie BraunKaren clinic note from last visit.

## 2015-12-12 ENCOUNTER — Encounter: Payer: Self-pay | Admitting: Psychiatry

## 2015-12-12 ENCOUNTER — Ambulatory Visit (INDEPENDENT_AMBULATORY_CARE_PROVIDER_SITE_OTHER): Payer: 59 | Admitting: Psychiatry

## 2015-12-12 VITALS — BP 122/80 | HR 118 | Temp 98.5°F | Ht 64.0 in | Wt 148.0 lb

## 2015-12-12 DIAGNOSIS — F331 Major depressive disorder, recurrent, moderate: Secondary | ICD-10-CM

## 2015-12-12 NOTE — Progress Notes (Signed)
Patient ID: Chapman FitchStephanie T Hynes, female   DOB: 11/15/1985, 30 y.o.   MRN: [id number]  [facility]/PA/NP OP Progress Note  12/12/2015 3:05 PM Chapman FitchStephanie T Pratt  MRN:  [id number]  Subjective:  Patient returns for follow-up of her major depressive disorder. Patient reports today that she is doing okay. She was able to tolerate the decrease in Wellbutrin to 150 mg. States that that has not made any change in her mood or anxiety. She continues to sleep well and has good appetite. She does discuss situation with her husband that they've been in marriage counseling. She described the situation and stated that she is thinking about what would be the best way to move forward. She also reports that she tries to do too much at her work. And she has been told by her colleagues to let go. She understands this and is trying to work on herself to improve her approach to things.  She denies any suicidal thoughts.  Chief Complaint: Marital issues Chief Complaint    Follow-up; Medication Refill; Anxiety    I am able to function Visit Diagnosis:   No diagnosis found.  Past Medical History:  Past Medical History  Diagnosis Date  . Short cervix in second trimester, antepartum 11/16/2013  . Migraines     a. x 10+ years.  . Postpartum care following cesarean delivery (5/29) 02/04/2014  . Anxiety   . Depression   . Pre-syncope     a. 07/2015 [facility] ED visit, ? vasovagal, improved with IVF.    Past Surgical History  Procedure Laterality Date  . Wisdom tooth extraction  2002  . Cesarean section N/A 02/03/2014    Procedure: Primary Cesarean Section Delivery Baby Girl @ 2244, Apgars 9/9;  Surgeon: Serita KyleSheronette A Cousins, MD;  Location: [facility];  Service: Obstetrics;  Laterality: N/A;  . Cesarean section     Family History:  Family History  Problem Relation Age of Onset  . Depression Mother   . Anxiety disorder Mother   . Heart attack Mother     NSTEMI @ age 32.  [name]. Heart attack Maternal Grandfather   . Anxiety  disorder Maternal Grandmother   . Depression Maternal Grandmother   . Diabetes Maternal Grandmother   . Hypertension Maternal Grandmother   . Alcohol abuse Paternal Grandfather   . Sleep apnea Father     alive @ 5156.   Social History:  Social History   Social History  . Marital Status: Married    Spouse Name: N/A  . Number of Children: N/A  . Years of Education: N/A   Social History Main Topics  . Smoking status: Never Smoker   . Smokeless tobacco: Never Used  . Alcohol Use: 0.0 oz/week    0 Standard drinks or equivalent per week     Comment: maybe two drinks/month.  . Drug Use: No  . Sexual Activity: Yes    Birth Control/ Protection: Implant   Other Topics Concern  . None   Social History Narrative   [place] in [place] with her husband and 6917 month old dtr.  Does not routinely exercise.       Additional History:   Assessment:   Musculoskeletal: Strength & Muscle Tone: within normal limits Gait & Station: normal Patient leans: N/A  Psychiatric Specialty Exam: Anxiety Symptoms include nervous/anxious behavior. Patient reports no insomnia or suicidal ideas.      Review of Systems  Psychiatric/Behavioral: Negative for depression, suicidal ideas, hallucinations, memory loss and substance abuse. The  patient is nervous/anxious. The patient does not have insomnia.        Weight gain over past several months  All other systems reviewed and are negative.   Blood pressure 122/80, pulse 118, temperature 98.5 F (36.9 C), temperature source Tympanic, height  (1.626 m), weight 148 lb (67.132 kg), last menstrual period 12/12/2015, SpO2 99 %, currently breastfeeding.Body mass index is 25.39 kg/(m^2).  General Appearance: Neat and Well Groomed  Eye Contact:  Good  Speech:  Clear and Coherent and Normal Rate  Volume:  Normal  Mood:  Okay  Affect:  Bright smile  Thought Process:  Linear and Logical  Orientation:  Full (Time, Place, and Person)  Thought Content:   Negative  Suicidal Thoughts:  No  Homicidal Thoughts:  No  Memory:  Immediate;   Good Recent;   Good Remote;   Good  Judgement:  Good  Insight:  Good  Psychomotor Activity:  Negative  Concentration:  Good  Recall:  Good  Fund of Knowledge: Good  Language: Good  Akathisia:  Negative  Handed:  Right unknown  AIMS (if indicated):  Not done  Assets:  Communication Skills Desire for Improvement Social Support Vocational/Educational  ADL's:  Intact  Cognition: WNL  Sleep:  Good, but states that the baby can interrupt her sleep pattern.    Is the patient at risk to self?  No. Has the patient been a risk to self in the past 6 months?  No. Has the patient been a risk to self within the distant past?  No. Is the patient a risk to others?  No. Has the patient been a risk to others in the past 6 months?  No. Has the patient been a risk to others within the distant past?  No.  Current Medications: Current Outpatient Prescriptions  Medication Sig Dispense Refill  . ALPRAZolam (XANAX) 0.25 MG tablet Take 1 tablet (0.25 mg total) by mouth daily as needed for anxiety. 30 tablet 1  . buPROPion (WELLBUTRIN XL) 150 MG 24 hr tablet Take 150 mg by mouth daily.    [name] escitalopram (LEXAPRO) 10 MG tablet Take 1.5 tablets (15 mg total) by mouth daily. 45 tablet 1  . Hydrocodone-Acetaminophen 10-300 MG TABS Take 1-2 tablets by mouth every 6 (six) hours as needed. 30 each 0  . naproxen sodium (ANAPROX DS) 550 MG tablet Take 1 tablet (550 mg total) by mouth as needed for headache. 60 tablet 1  . ondansetron (ZOFRAN ODT) 4 MG disintegrating tablet Take 1 tablet (4 mg total) by mouth every 8 (eight) hours as needed for nausea or vomiting. 20 tablet 0  . SUMAtriptan (IMITREX) 100 MG tablet Take 1 tablet (100 mg total) by mouth as needed. 12 tablet 11  . SUMAtriptan 6 MG/0.5ML SOAJ Inject 1 cartridge into the skin as needed (migraine). May repeat in 2 hours.  No more than 2 doses of Imitrex per day. 12  cartridge 3  . topiramate (TOPAMAX) 25 MG tablet Take 1 tablet (25 mg total) by mouth 2 (two) times daily. 90 tablet 2   No current facility-administered medications for this visit.    Medical Decision Making:  Established Problem, Stable/Improving (1) patient indicates some improvement on medication but wonders whether she could feel even better with a higher dose. He is denying any side effects on the Lexapro.  Treatment Plan Summary:Medication management Major depressive disorder, recurrent, moderate In remission Continue Lexapro at 15 mg once daily Discontinue the Wellbutrin to 150 mg once daily.  Other anxiety disorder- alprazolam 0.25 mg daily as needed for anxiety/panic. Risk and benefits of been discussing patient's able to consent.  Patient will follow up in 1 month or call before if needed  .Isreal Moline 12/12/2015, 3:05 PM

## 2015-12-20 ENCOUNTER — Other Ambulatory Visit: Payer: Self-pay | Admitting: Physician Assistant

## 2016-01-10 ENCOUNTER — Ambulatory Visit (INDEPENDENT_AMBULATORY_CARE_PROVIDER_SITE_OTHER): Payer: 59 | Admitting: Psychiatry

## 2016-01-10 ENCOUNTER — Encounter: Payer: Self-pay | Admitting: Psychiatry

## 2016-01-10 VITALS — BP 130/86 | HR 102 | Temp 98.7°F | Ht 64.0 in | Wt 148.4 lb

## 2016-01-10 DIAGNOSIS — F331 Major depressive disorder, recurrent, moderate: Secondary | ICD-10-CM | POA: Diagnosis not present

## 2016-01-10 MED ORDER — ESCITALOPRAM OXALATE 10 MG PO TABS: 15.0000 mg | ORAL_TABLET | Freq: Every day | ORAL | Status: DC

## 2016-01-10 NOTE — Progress Notes (Signed)
Patient ID: Candice [name], female   DOB: 06/26/1986, 30 y.o.   MRN: [id number]   [facility]  01/10/2016 2:25 PM Candice FitchStephanie T [name]  MRN:  [id number]  Subjective:  Patient returns for follow-up of her major depressive disorder. Patient reports today that she is doing okay. She was able to tolerate the decrease in Wellbutrin and has been able to taper off. Was initially irritable but feeling somewhat better now.  States her anxiety is better. Fair sleep and appetite. She does have episodes of anxiety about her work. She is in marriage counselling and states her husband`s mood is stable. She takes xanax 0.25mg  once or twice a week.   Chief Complaint: Marital issues Chief Complaint    Follow-up; Medication Refill    I am able to function Visit Diagnosis:     ICD-9-CM ICD-10-CM   1. Major depressive disorder, recurrent episode, moderate (HCC) 296.32 F33.1     Past Medical History:  Past Medical History  Diagnosis Date  . Short cervix in second trimester, antepartum 11/16/2013  . Migraines     a. x 10+ years.  . Postpartum care following cesarean delivery (5/29) 02/04/2014  . Anxiety   . Depression   . Pre-syncope     a. 07/2015 [facility] ED visit, ? vasovagal, improved with IVF.    Past Surgical History  Procedure Laterality Date  . Wisdom tooth extraction  2002  . Cesarean section N/A 02/03/2014    Procedure: Primary Cesarean Section Delivery Baby Girl @ 2244, Apgars 9/9;  Surgeon: Serita KyleSheronette A Cousins, MD;  Location: [facility];  Service: Obstetrics;  Laterality: N/A;  . Cesarean section     Family History:  Family History  Problem Relation Age of Onset  . Depression Mother   . Anxiety disorder Mother   . Heart attack Mother     NSTEMI @ age 32.  [name]. Heart attack Maternal Grandfather   . Anxiety disorder Maternal Grandmother   . Depression Maternal Grandmother   . Diabetes Maternal Grandmother   . Hypertension Maternal Grandmother   . Alcohol abuse Paternal  Grandfather   . Sleep apnea Father     alive @ 6756.   Social History:  Social History   Social History  . Marital Status: Married    Spouse Name: N/A  . Number of Children: N/A  . Years of Education: N/A   Social History Main Topics  . Smoking status: Never Smoker   . Smokeless tobacco: Never Used  . Alcohol Use: 0.0 oz/week    0 Standard drinks or equivalent per week     Comment: maybe two drinks/month.  . Drug Use: No  . Sexual Activity: Yes    Birth Control/ Protection: Implant   Other Topics Concern  . None   Social History Narrative   [place] in [place] with her husband and 2317 month old dtr.  Does not routinely exercise.       Additional History:   Assessment:   Musculoskeletal: Strength & Muscle Tone: within normal limits Gait & Station: normal Patient leans: N/A  Psychiatric Specialty Exam: Anxiety Symptoms include nervous/anxious behavior. Patient reports no insomnia or suicidal ideas.      Review of Systems  Psychiatric/Behavioral: Negative for depression, suicidal ideas, hallucinations, memory loss and substance abuse. The patient is nervous/anxious. The patient does not have insomnia.        Weight gain over past several months  All other systems reviewed and are negative.  Blood pressure 130/86, pulse 102, temperature 98.7 F (37.1 C), temperature source Tympanic, height 5\' 4"  (1.626 m), weight 148 lb 6.4 oz (67.314 kg), last menstrual period 12/12/2015, SpO2 99 %, currently breastfeeding.Body mass index is 25.46 kg/(m^2).  General Appearance: Neat and Well Groomed  Eye Contact:  Good  Speech:  Clear and Coherent and Normal Rate  Volume:  Normal  Mood:  Okay  Affect:  Bright smile  Thought Process:  Linear and Logical  Orientation:  Full (Time, Place, and Person)  Thought Content:  Negative  Suicidal Thoughts:  No  Homicidal Thoughts:  No  Memory:  Immediate;   Good Recent;   Good Remote;   Good  Judgement:  Good  Insight:  Good   Psychomotor Activity:  Negative  Concentration:  Good  Recall:  Good  Fund of Knowledge: Good  Language: Good  Akathisia:  Negative  Handed:  Right unknown  AIMS (if indicated):  Not done  Assets:  Communication Skills Desire for Improvement Social Support Vocational/Educational  ADL's:  Intact  Cognition: WNL  Sleep:  Good, but states that the baby can interrupt her sleep pattern.    Is the patient at risk to self?  No. Has the patient been a risk to self in the past 6 months?  No. Has the patient been a risk to self within the distant past?  No. Is the patient a risk to others?  No. Has the patient been a risk to others in the past 6 months?  No. Has the patient been a risk to others within the distant past?  No.  Current Medications: Current Outpatient Prescriptions  Medication Sig Dispense Refill  . ALPRAZolam (XANAX) 0.25 MG tablet Take 1 tablet (0.25 mg total) by mouth daily as needed for anxiety. 30 tablet 1  . buPROPion (WELLBUTRIN XL) 150 MG 24 hr tablet Take 150 mg by mouth daily.    [name] escitalopram (LEXAPRO) 10 MG tablet Take 1.5 tablets (15 mg total) by mouth daily. 45 tablet 1  . Hydrocodone-Acetaminophen 10-300 MG TABS Take 1-2 tablets by mouth every 6 (six) hours as needed. 30 each 0  . naproxen sodium (ANAPROX DS) 550 MG tablet Take 1 tablet (550 mg total) by mouth as needed for headache. 60 tablet 1  . ondansetron (ZOFRAN ODT) 4 MG disintegrating tablet Take 1 tablet (4 mg total) by mouth every 8 (eight) hours as needed for nausea or vomiting. 20 tablet 0  . SUMAtriptan (IMITREX) 100 MG tablet Take 1 tablet (100 mg total) by mouth as needed. 12 tablet 11  . SUMAtriptan 6 MG/0.5ML SOAJ Inject 1 cartridge into the skin as needed (migraine). May repeat in 2 hours.  No more than 2 doses of Imitrex per day. 12 cartridge 3  . topiramate (TOPAMAX) 25 MG tablet Take 1 tablet (25 mg total) by mouth 2 (two) times daily. 90 tablet 2   No current facility-administered  medications for this visit.    Medical Decision Making:  Established Problem, Stable/Improving (1) patient indicates some improvement on medication but wonders whether she could feel even better with a higher dose. He is denying any side effects on the Lexapro.  Treatment Plan Summary:Medication management   Major depressive disorder, recurrent, moderate In remission Continue Lexapro at 15 mg once daily Irritability does not improve patient can take Lexapro at 20 mg after one week.  Other anxiety disorder- alprazolam 0.25 mg daily as needed for anxiety/panic. Risk and benefits of been discussing patient's able to consent.  Patient will follow up in 1 month or call before if needed  .Gianina Olinde 01/10/2016, 2:25 PM

## 2016-01-23 ENCOUNTER — Telehealth: Payer: Self-pay

## 2016-01-23 NOTE — Telephone Encounter (Signed)
LEFT MESSAGE FOR PT TO CALL BACK.  ALSO TOLD PT THAT IF SHE WAS HAVING MEDICATION ISSUES SHE SHOULD SET UP AN APPT TO BE SEEN BUT THAT I WOULD SEND DR. RAVI A MESSAGE

## 2016-01-23 NOTE — Telephone Encounter (Signed)
PT CALLED LEFT A MESSAGE THAT SHE WAS HAVING MEDICATION ISSUES

## 2016-01-30 NOTE — Telephone Encounter (Signed)
looks like pt made appt for 02-12-16

## 2016-02-12 ENCOUNTER — Ambulatory Visit (INDEPENDENT_AMBULATORY_CARE_PROVIDER_SITE_OTHER): Payer: 59 | Admitting: Psychiatry

## 2016-02-12 ENCOUNTER — Encounter: Payer: Self-pay | Admitting: Psychiatry

## 2016-02-12 VITALS — BP 110/82 | HR 100 | Temp 97.9°F | Ht 64.0 in | Wt 150.8 lb

## 2016-02-12 DIAGNOSIS — F331 Major depressive disorder, recurrent, moderate: Secondary | ICD-10-CM

## 2016-02-12 MED ORDER — ESCITALOPRAM OXALATE 20 MG PO TABS: 20.0000 mg | ORAL_TABLET | Freq: Every day | ORAL | Status: DC

## 2016-02-12 NOTE — Progress Notes (Signed)
Patient ID: Candice [name], female   DOB: Oct 30, 1985, 30 y.o.   MRN: [id number]  [facility] MD/PA/NP OP Progress Note  02/12/2016 2:31 PM Candice [name]  MRN:  [id number]  Subjective:  Patient returns for follow-up of her major depressive disorder. Patient reports today that she is doing okay. States she has been more emotional . 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 States she also is unable to let go of things in his obsessive about some of the factors which that lead to stress between her and her husband. She is in marriage counselling and states her husband`s mood is stable. States she has not had her period for 2 months and just got it and wonders if that lead to the excessive emotionality. Also reports she was taking Topamax  for her headaches and that led to some word loss issues and she just reduce the dosage. Overall states she is doing okay at work. Denies any suicidal thoughts. She takes xanax 0.25mg  once or twice a week.   Chief Complaint: Marital issues I am able to function Visit Diagnosis:     ICD-9-CM ICD-10-CM   1. Major depressive disorder, recurrent episode, moderate (HCC) 296.32 F33.1     Past Medical History:  Past Medical History  Diagnosis Date  . Short cervix in second trimester, antepartum 11/16/2013  . Migraines     a. x 10+ years.  . Postpartum care following cesarean delivery (5/29) 02/04/2014  . Anxiety   . Depression   . Pre-syncope     a. 07/2015 [name] ED visit, ? vasovagal, improved with IVF.    Past Surgical History  Procedure Laterality Date  . Wisdom tooth extraction  2002  . Cesarean section N/A 02/03/2014    Procedure: Primary Cesarean Section Delivery Baby Girl @ 2244, Apgars 9/9;  Surgeon: Serita Kyle, MD;  Location: [facility];  Service: Obstetrics;  Laterality: N/A;  . Cesarean section     Family History:  Family History  Problem Relation Age  of Onset  . Depression Mother   . Anxiety disorder Mother   . Heart attack Mother     NSTEMI @ age 22.  [name] Heart attack Maternal Grandfather   . Anxiety disorder Maternal Grandmother   . Depression Maternal Grandmother   . Diabetes Maternal Grandmother   . Hypertension Maternal Grandmother   . Alcohol abuse Paternal Grandfather   . Sleep apnea Father     alive @ 60.   Social History:  Social History   Social History  . Marital Status: Married    Spouse Name: N/A  . Number of Children: N/A  . Years of Education: N/A   Social History Main Topics  . Smoking status: Never Smoker   . Smokeless tobacco: Never Used  . Alcohol Use: 0.0 oz/week    0 Standard drinks or equivalent per week     Comment: maybe two drinks/month.  . Drug Use: No  . Sexual Activity: Yes    Birth Control/ Protection: Implant   Other Topics Concern  . None   Social History Narrative   Lives in [place] with her husband and 40 month old dtr.  Does not routinely exercise.       Additional History:   Assessment:   Musculoskeletal: Strength & Muscle Tone: within normal limits Gait & Station: normal Patient leans: N/A  Psychiatric Specialty Exam: Anxiety Symptoms include  nervous/anxious behavior. Patient reports no insomnia or suicidal ideas.      Review of Systems  Psychiatric/Behavioral: Negative for depression, suicidal ideas, hallucinations, memory loss and substance abuse. The patient is nervous/anxious. The patient does not have insomnia.        Weight gain over past several months  All other systems reviewed and are negative.   currently breastfeeding.There is no weight on file to calculate BMI.  General Appearance: Neat and Well Groomed  Eye Contact:  Good  Speech:  Clear and Coherent and Normal Rate  Volume:  Normal  Mood:  Okay  Affect:  Bright smile  Thought Process:  Linear and Logical  Orientation:  Full (Time, Place, and Person)  Thought Content:  Negative  Suicidal  Thoughts:  No  Homicidal Thoughts:  No  Memory:  Immediate;   Good Recent;   Good Remote;   Good  Judgement:  Good  Insight:  Good  Psychomotor Activity:  Negative  Concentration:  Good  Recall:  Good  Fund of Knowledge: Good  Language: Good  Akathisia:  Negative  Handed:  Right unknown  AIMS (if indicated):  Not done  Assets:  Communication Skills Desire for Improvement Social Support Vocational/Educational  ADL's:  Intact  Cognition: WNL  Sleep:  Good, but states that the baby can interrupt her sleep pattern.    Is the patient at risk to self?  No. Has the patient been a risk to self in the past 6 months?  No. Has the patient been a risk to self within the distant past?  No. Is the patient a risk to others?  No. Has the patient been a risk to others in the past 6 months?  No. Has the patient been a risk to others within the distant past?  No.  Current Medications: Current Outpatient Prescriptions  Medication Sig Dispense Refill  . ALPRAZolam (XANAX) 0.25 MG tablet Take 1 tablet (0.25 mg total) by mouth daily as needed for anxiety. 30 tablet 1  . buPROPion (WELLBUTRIN XL) 150 MG 24 hr tablet Take 150 mg by mouth daily.    [name]. escitalopram (LEXAPRO) 10 MG tablet Take 1.5 tablets (15 mg total) by mouth daily. 45 tablet 1  . Hydrocodone-Acetaminophen 10-300 MG TABS Take 1-2 tablets by mouth every 6 (six) hours as needed. 30 each 0  . naproxen sodium (ANAPROX DS) 550 MG tablet Take 1 tablet (550 mg total) by mouth as needed for headache. 60 tablet 1  . ondansetron (ZOFRAN-ODT) 4 MG disintegrating tablet TAKE 1 TABLET BY MOUTH EVERY 8 HOURS AS NEEDED FOR NAUSEA OR VOMITING. 20 tablet 0  . SUMAtriptan (IMITREX) 100 MG tablet Take 1 tablet (100 mg total) by mouth as needed. 12 tablet 11  . SUMAtriptan 6 MG/0.5ML SOAJ Inject 1 cartridge into the skin as needed (migraine). May repeat in 2 hours.  No more than 2 doses of Imitrex per day. 12 cartridge 3  . topiramate (TOPAMAX) 25 MG  tablet Take 1 tablet (25 mg total) by mouth 2 (two) times daily. 90 tablet 2   No current facility-administered medications for this visit.    Medical Decision Making:  Established Problem, Stable/Improving (1) patient indicates some improvement on medication but wonders whether she could feel even better with a higher dose. He is denying any side effects on the Lexapro.  Treatment Plan Summary:Medication management   Major depressive disorder, recurrent, moderate In remission Increase Lexapro to 20 mg once daily To address the anxiety and obsessive thoughts.  Start therapy with Nolon Rod.  Other anxiety disorder- alprazolam 0.25 mg daily as needed for anxiety/panic. Risk and benefits of been discussing patient's able to consent.  Patient will follow up in 1 month or call before if needed  .Jackelynn Hosie 02/12/2016, 2:31 PM

## 2016-02-26 ENCOUNTER — Ambulatory Visit (INDEPENDENT_AMBULATORY_CARE_PROVIDER_SITE_OTHER): Payer: 59 | Admitting: Licensed Clinical Social Worker

## 2016-02-26 DIAGNOSIS — F331 Major depressive disorder, recurrent, moderate: Secondary | ICD-10-CM

## 2016-02-29 ENCOUNTER — Ambulatory Visit (INDEPENDENT_AMBULATORY_CARE_PROVIDER_SITE_OTHER): Payer: 59 | Admitting: Physician Assistant

## 2016-02-29 VITALS — BP 115/76 | HR 89

## 2016-02-29 DIAGNOSIS — G43009 Migraine without aura, not intractable, without status migrainosus: Secondary | ICD-10-CM

## 2016-02-29 DIAGNOSIS — M62838 Other muscle spasm: Secondary | ICD-10-CM | POA: Diagnosis not present

## 2016-02-29 MED ORDER — CYCLOBENZAPRINE HCL 10 MG PO TABS: 10.0000 mg | ORAL_TABLET | Freq: Three times a day (TID) | ORAL | Status: DC | PRN

## 2016-02-29 MED ORDER — HYDROCODONE-ACETAMINOPHEN 10-300 MG PO TABS: 1.0000 | ORAL_TABLET | Freq: Four times a day (QID) | ORAL | Status: DC | PRN

## 2016-02-29 MED ORDER — NAPROXEN SODIUM 550 MG PO TABS: 550.0000 mg | ORAL_TABLET | ORAL | Status: DC | PRN

## 2016-02-29 MED ORDER — TOPIRAMATE 25 MG PO TABS: 25.0000 mg | ORAL_TABLET | Freq: Two times a day (BID) | ORAL | Status: DC

## 2016-02-29 MED ORDER — ONDANSETRON 4 MG PO TBDP: 4.0000 mg | ORAL_TABLET | Freq: Three times a day (TID) | ORAL | Status: DC | PRN

## 2016-02-29 NOTE — Progress Notes (Signed)
   THERAPIST PROGRESS NOTE  Session Time: 60min  Participation Level: Active  Behavioral Response: NeatAlertDepressed  Type of Therapy: Individual Therapy  Treatment Goals addressed: Coping and Diagnosis: Depression  Interventions: CBT, Motivational Interviewing, Solution Focused, Strength-based, Supportive, Family Systems and Reframing  Summary: Chapman FitchStephanie T Hinojosa is a 30 y.o. female who presents with depressive symptoms such as: sadness, fatigue, lack of motivation, tearfulness.  LCSW discussed what psychotherapy is and is not and the importance of the therapeutic relationship to include open and honest communication between client and therapist and building trust.  Reviewed advantages and disadvantages of the therapeutic process and limitations to the therapeutic relationship including LCSW's role in maintaining the safety of the client, others and those in client's care. Discussion of her biopsychosocial.  Prepared her treatment plan to decrease unwanted mood and symptoms and to increase communication with her spouse.  Discussed coping skills and how they will assist with improvement of symptoms.   Suicidal/Homicidal: Nowithout intent/plan  Therapist Response: LCSW provided Patient with ongoing emotional support and encouragement.  Normalized her feelings.  Commended Patient on her progress and reinforced the importance of client staying focused on her own strengths and resources and resiliency. Processed various strategies for dealing with stressors.    Plan: Return again in 2 weeks.  Diagnosis: Axis I: Depression, moderate    Axis II: No diagnosis    Marinda Elkicole M Madgie Dhaliwal, LCSW 02/27/2016

## 2016-02-29 NOTE — Patient Instructions (Signed)

## 2016-03-03 ENCOUNTER — Encounter: Payer: Self-pay | Admitting: *Deleted

## 2016-03-04 NOTE — Progress Notes (Signed)
Patient ID: Candice [name] Nill, female   DOB: 01/19/1986, 30 y.o.   MRN: [id number] History:  Candice [name] Bardin is a 30 y.o. G1P1001 who presents to clinic today for  Follow up of migraine headaches.  She had an instance of slurred speech a few weeks ago.  It was noted by co-workers.  Could not get the words out.  She was uncertain if it was related to increased Topamax (to 50mg ) or not.  She did not go for eval.  It was short lived.  Her HA's have been unchanged.  They are responding to present acute regimen.   Increased stress at home.  Has marriage counselor in place.  Not presently desiring pregnancy but may consider in next year or two.   HIT6:66   Past Medical History  Diagnosis Date  . Short cervix in second trimester, antepartum 11/16/2013  . Migraines     a. x 10+ years.  . Postpartum care following cesarean delivery (5/29) 02/04/2014  . Anxiety   . Depression   . Pre-syncope     a. 07/[facility] ED visit, ? vasovagal, improved with IVF.    Social History   Social History  . Marital Status: Married    Spouse Name: N/A  . Number of Children: N/A  . Years of Education: N/A   Occupational History  . Not on file.   Social History Main Topics  . Smoking status: Never Smoker   . Smokeless tobacco: Never Used  . Alcohol Use: 0.0 oz/week    0 Standard drinks or equivalent per week     Comment: maybe two drinks/month.  . Drug Use: No  . Sexual Activity: Yes    Birth Control/ Protection: Implant   Other Topics Concern  . Not on file   Social History Narrative   Lives in [place] with her husband and 6717 month old dtr.  Does not routinely exercise.        Family History  Problem Relation Age of Onset  . Depression Mother   . Anxiety disorder Mother   . Heart attack Mother     NSTEMI @ age 32.  [name]. Heart attack Maternal Grandfather   . Anxiety disorder Maternal Grandmother   . Depression Maternal Grandmother   . Diabetes Maternal Grandmother   . Hypertension Maternal  Grandmother   . Alcohol abuse Paternal Grandfather   . Sleep apnea Father     alive @ 2356.    Allergies  Allergen Reactions  . Sulfa Antibiotics Other (See Comments)  . Sulfur Hives    Current Outpatient Prescriptions on File Prior to Visit  Medication Sig Dispense Refill  . ALPRAZolam (XANAX) 0.25 MG tablet Take 1 tablet (0.25 mg total) by mouth daily as needed for anxiety. 30 tablet 1  . escitalopram (LEXAPRO) 20 MG tablet Take 1 tablet (20 mg total) by mouth daily. 30 tablet 1  . SUMAtriptan (IMITREX) 100 MG tablet Take 1 tablet (100 mg total) by mouth as needed. 12 tablet 11  . SUMAtriptan 6 MG/0.5ML SOAJ Inject 1 cartridge into the skin as needed (migraine). May repeat in 2 hours.  No more than 2 doses of Imitrex per day. 12 cartridge 3   No current facility-administered medications on file prior to visit.     Review of Systems:  All pertinent positive/negative included in HPI, all other review of systems are negative  Objective:  Physical Exam BP 115/76 mmHg  Pulse 89 CONSTITUTIONAL: Well-developed, well-nourished female in no acute distress.  EYES: EOM intact ENT: Normocephalic CARDIOVASCULAR: Regular rate and rhythm with no adventitious sounds.  RESPIRATORY: Normal rate. Clear to auscultation bilaterally.  ENDOCRINE: Normal thyroid.  MUSCULOSKELETAL: Normal ROM, strength equal bilaterally, trapezius muscle spasm noted SKIN: Warm, dry without erythema  NEUROLOGICAL: Alert, oriented, CN II-XII grossly intact, Appropriate balance PSYCH: Normal behavior, mood   Assessment & Plan:  Assessment: 1. Migraine without aura and without status migrainosus, not intractable    2.  Muscle Spasm   Plan: Re-try Topamax at 50mg .  If slurred speech - discontinue and call office to report.  Continue Imitrex as previously prescribed Flexeril - use for muscle tension/acute mild HA - sedation precautions Naprosyn - may use for HA with or without imitrex Zofran - prn  nausea  Follow-up in 2-3 months or sooner PRN  Bertram DenverKaren E Teague Clark, PA-C 03/04/2016 11:45 AM

## 2016-03-12 ENCOUNTER — Ambulatory Visit (INDEPENDENT_AMBULATORY_CARE_PROVIDER_SITE_OTHER): Payer: 59 | Admitting: Psychiatry

## 2016-03-12 ENCOUNTER — Encounter: Payer: Self-pay | Admitting: Psychiatry

## 2016-03-12 VITALS — BP 110/72 | HR 95 | Temp 98.6°F | Ht 64.0 in | Wt 151.0 lb

## 2016-03-12 DIAGNOSIS — F331 Major depressive disorder, recurrent, moderate: Secondary | ICD-10-CM | POA: Diagnosis not present

## 2016-03-12 MED ORDER — ESCITALOPRAM OXALATE 20 MG PO TABS: 20.0000 mg | ORAL_TABLET | Freq: Every day | ORAL | Status: DC

## 2016-03-12 NOTE — Progress Notes (Signed)
Patient ID: Candice FitchStephanie T [name], female   DOB: 12/10/1985, 30 y.o.   MRN: [id number]   [facility] Progress Note  03/12/2016 3:38 PM Candice [name]  MRN:  [id number]  Subjective:  Patient returns for follow-up of her major depressive disorder. Patient reports today that she is doing better, not as emotional. Reports being sleepy since increasing the Lexapro . Patient would like to know if she can take it at nighttime and was told that she could take it at night. States that she is continuing marital therapy along with her husband and it's the same. States that since she decreased the Topamax to 50 mg her memory has been better. Overall  she is doing okay at work. Denies any suicidal thoughts. She takes xanax 0.25mg  once or twice a week.   Chief Complaint: mood is better  Visit Diagnosis:     ICD-9-CM ICD-10-CM   1. Major depressive disorder, recurrent episode, moderate (HCC) 296.32 F33.1     Past Medical History:  Past Medical History  Diagnosis Date  . Short cervix in second trimester, antepartum 11/16/2013  . Migraines     a. x 10+ years.  . Postpartum care following cesarean delivery (5/29) 02/04/2014  . Anxiety   . Depression   . Pre-syncope     a. 07/2015 [facility] visit, ? vasovagal, improved with IVF.    Past Surgical History  Procedure Laterality Date  . Wisdom tooth extraction  2002  . Cesarean section N/A 02/03/2014    Procedure: Primary Cesarean Section Delivery Baby Girl @ 2244, Apgars 9/9;  Surgeon: Serita KyleSheronette A Cousins, MD;  Location: [facility];  Service: Obstetrics;  Laterality: N/A;  . Cesarean section     Family History:  Family History  Problem Relation Age of Onset  . Depression Mother   . Anxiety disorder Mother   . Heart attack Mother     NSTEMI @ age 32.  [name]. Heart attack Maternal Grandfather   . Anxiety disorder Maternal Grandmother   . Depression Maternal Grandmother   . Diabetes Maternal Grandmother   . Hypertension Maternal Grandmother   . Alcohol  abuse Paternal Grandfather   . Sleep apnea Father     alive @ 6956.   Social History:  Social History   Social History  . Marital Status: Married    Spouse Name: N/A  . Number of Children: N/A  . Years of Education: N/A   Social History Main Topics  . Smoking status: Never Smoker   . Smokeless tobacco: Never Used  . Alcohol Use: 0.0 oz/week    0 Standard drinks or equivalent per week     Comment: maybe two drinks/month.  . Drug Use: No  . Sexual Activity: Yes    Birth Control/ Protection: Implant   Other Topics Concern  . Not on file   Social History Narrative   Lives in [place] with her husband and 817 month old dtr.  Does not routinely exercise.       Additional History:   Assessment:   Musculoskeletal: Strength & Muscle Tone: within normal limits Gait & Station: normal Patient leans: N/A  Psychiatric Specialty Exam: Anxiety Symptoms include nervous/anxious behavior. Patient reports no insomnia or suicidal ideas.      Review of Systems  Psychiatric/Behavioral: Negative for depression, suicidal ideas, hallucinations, memory loss and substance abuse. The patient is nervous/anxious. The patient does not have insomnia.        Weight gain over past several months  All  other systems reviewed and are negative.   currently breastfeeding.There is no weight on file to calculate BMI.  General Appearance: Neat and Well Groomed  Eye Contact:  Good  Speech:  Clear and Coherent and Normal Rate  Volume:  Normal  Mood:  Okay  Affect:  Bright smile  Thought Process:  Linear and Logical  Orientation:  Full (Time, Place, and Person)  Thought Content:  Negative  Suicidal Thoughts:  No  Homicidal Thoughts:  No  Memory:  Immediate;   Good Recent;   Good Remote;   Good  Judgement:  Good  Insight:  Good  Psychomotor Activity:  Negative  Concentration:  Good  Recall:  Good  Fund of Knowledge: Good  Language: Good  Akathisia:  Negative  Handed:  Right unknown  AIMS (if  indicated):  Not done  Assets:  Communication Skills Desire for Improvement Social Support Vocational/Educational  ADL's:  Intact  Cognition: WNL  Sleep:  Good, but states that the baby can interrupt her sleep pattern.    Is the patient at risk to self?  No. Has the patient been a risk to self in the past 6 months?  No. Has the patient been a risk to self within the distant past?  No. Is the patient a risk to others?  No. Has the patient been a risk to others in the past 6 months?  No. Has the patient been a risk to others within the distant past?  No.  Current Medications: Current Outpatient Prescriptions  Medication Sig Dispense Refill  . ALPRAZolam (XANAX) 0.25 MG tablet Take 1 tablet (0.25 mg total) by mouth daily as needed for anxiety. 30 tablet 1  . cyclobenzaprine (FLEXERIL) 10 MG tablet Take 1 tablet (10 mg total) by mouth every 8 (eight) hours as needed for muscle spasms. 30 tablet 1  . escitalopram (LEXAPRO) 20 MG tablet Take 1 tablet (20 mg total) by mouth daily. 30 tablet 1  . Hydrocodone-Acetaminophen 10-300 MG TABS Take 1-2 tablets by mouth every 6 (six) hours as needed. 30 each 0  . naproxen sodium (ANAPROX DS) 550 MG tablet Take 1 tablet (550 mg total) by mouth as needed for headache. 60 tablet 1  . ondansetron (ZOFRAN-ODT) 4 MG disintegrating tablet Take 1 tablet (4 mg total) by mouth every 8 (eight) hours as needed for nausea or vomiting. 20 tablet 1  . SUMAtriptan (IMITREX) 100 MG tablet Take 1 tablet (100 mg total) by mouth as needed. 12 tablet 11  . SUMAtriptan 6 MG/0.5ML SOAJ Inject 1 cartridge into the skin as needed (migraine). May repeat in 2 hours.  No more than 2 doses of Imitrex per day. 12 cartridge 3  . topiramate (TOPAMAX) 25 MG tablet Take 1 tablet (25 mg total) by mouth 2 (two) times daily. 90 tablet 2   No current facility-administered medications for this visit.    Medical Decision Making:  Established Problem, Stable/Improving (1) patient indicates  some improvement on medication but wonders whether she could feel even better with a higher dose. He is denying any side effects on the Lexapro.  Treatment Plan Summary:Medication management   Major depressive disorder, recurrent, moderate In remission Continue Lexapro 20 mg and take it at bedtime Continue therapy with Nolon RodNicole Peacock.  Other anxiety disorder- alprazolam 0.25 mg daily as needed for anxiety/panic. Risk and benefits of been discussing patient's able to consent.  Patient will follow up in 13month or call before if needed  .Dinari Stgermaine 03/12/2016, 3:38 PM

## 2016-03-19 ENCOUNTER — Ambulatory Visit (INDEPENDENT_AMBULATORY_CARE_PROVIDER_SITE_OTHER): Payer: 59 | Admitting: Licensed Clinical Social Worker

## 2016-03-19 ENCOUNTER — Ambulatory Visit: Payer: Self-pay | Admitting: Licensed Clinical Social Worker

## 2016-03-19 DIAGNOSIS — F331 Major depressive disorder, recurrent, moderate: Secondary | ICD-10-CM

## 2016-03-24 ENCOUNTER — Other Ambulatory Visit: Payer: Self-pay

## 2016-03-24 MED ORDER — ALPRAZOLAM 0.25 MG PO TABS: 0.2500 mg | ORAL_TABLET | Freq: Every day | ORAL | Status: DC | PRN

## 2016-03-24 NOTE — Telephone Encounter (Signed)
need refill on alprazolam.25 mg  (this is a Dr. Daleen Boavi patient.)

## 2016-03-24 NOTE — Telephone Encounter (Signed)
rx faxed and confirmed for xanax .25mg  id [id number] order # [id number]

## 2016-03-28 NOTE — Progress Notes (Signed)
   THERAPIST PROGRESS NOTE  Session Time: 60 min  Participation Level: Active  Behavioral Response: CasualAlertDepressed  Type of Therapy: Individual Therapy  Treatment Goals addressed: Coping  Interventions: CBT, Motivational Interviewing, Solution Focused and Supportive  Summary: Candice FitchStephanie T [name] is a 30 y.o. female who presents with continued symptoms of her diagnosis.  She was able to say her thoughts and feelings since the previous session.  She reports that nothing has changed between the relationship with her and her husband.  She reports that she continues to attend marriage counseling.  She reports being overwhelmed with dealing with her personal issues, there child and she is taken on her husband's issues.  She reports that she wants to pull back from her husband's issues, but is unsure how.  She was able to verbally list her concerns with her husband.  She was able to deduce that she wants to put him in charge of giving that all his medication and him attending his psychiatry appointment on his own.  She reports that she is overwhelmed, stressed and wants to isolate.   Suicidal/Homicidal: Nowithout intent/plan  Therapist Response:LCSW provided Patient with ongoing emotional support and encouragement.  Normalized her feelings.  Commended Patient on her progress and reinforced the importance of client staying focused on her own strengths and resources and resiliency. Processed various strategies for dealing with stressors.    Plan: Return again in 2 weeks.  Diagnosis: Axis I: Depression    Axis II: No diagnosis    Marinda Elkicole M Peacock, LCSW 03/19/2016

## 2016-04-08 ENCOUNTER — Ambulatory Visit (INDEPENDENT_AMBULATORY_CARE_PROVIDER_SITE_OTHER): Payer: 59 | Admitting: Licensed Clinical Social Worker

## 2016-04-08 DIAGNOSIS — F331 Major depressive disorder, recurrent, moderate: Secondary | ICD-10-CM | POA: Diagnosis not present

## 2016-04-25 NOTE — Progress Notes (Signed)
   THERAPIST PROGRESS NOTE  Session Time: 3560  Participation Level: Active  Behavioral Response: Neat and Well GroomedAlertEuthymic  Type of Therapy: Individual Therapy  Treatment Goals addressed: Communication: strategies and Coping  Interventions: Motivational Interviewing, Solution Focused, Supportive, Family Systems and Reframing  Summary: Candice [name] is a 30 y.o. female who presents with continued symptoms of her diagnosis.  Patient continues to wrestle with her husband and his inability to follow through with his mental health treatment.  Patient was able to vent her frustration and devise a plan on how she can assist him with completion of important task.  Role Played communication strategies and how to motivate him.  Patient listed coping strategies that work.  Patient was given homework (attempt coping strategies, journal, use mood tracker on phone).   Suicidal/Homicidal: Nowithout intent/plan  Therapist Response: LCSW provided Patient with ongoing emotional support and encouragement.  Normalized her feelings.  Commended Patient on her progress and reinforced the importance of client staying focused on her own strengths and resources and resiliency. Processed various strategies for dealing with stressors.    Plan: Return again in 3 weeks.  Diagnosis: Axis I: Depression    Axis II: No diagnosis    Marinda Elkicole M Broderic Bara, LCSW 04/25/2016

## 2016-04-29 ENCOUNTER — Ambulatory Visit (INDEPENDENT_AMBULATORY_CARE_PROVIDER_SITE_OTHER): Payer: 59 | Admitting: Licensed Clinical Social Worker

## 2016-04-29 DIAGNOSIS — F331 Major depressive disorder, recurrent, moderate: Secondary | ICD-10-CM | POA: Diagnosis not present

## 2016-05-15 NOTE — Progress Notes (Signed)
   THERAPIST PROGRESS NOTE  Session Time: 60min  Participation Level: Active  Behavioral Response: NeatAlertEuthymic  Type of Therapy: Individual Therapy  Treatment Goals addressed: Coping  Interventions: Motivational Interviewing, Solution Focused, Supportive, Family Systems and Reframing  Summary: Candice [name] is a 30 y.o. female who presents with continued symptoms of her depression.  Therapist conducted a brief mood assessment inquiring about happiness, excitement, fear, sadness, disgust and anger. Therapist provided support for Patient as she expressed an incident the following week where she was engaged in a few different verbal altercations with her husband.  Therapist assisted Patient with processing her emotion regarding the incidents and assisted her with learning how she could have diffused the situations without becoming argumentative. Therapist and Patient went through the altercations step by step, and Therapist replaced Patient's reported responses with more positive, more assertive responses. Therapist and Patient reviewed Patient's triggers to anger.    Suicidal/Homicidal: No  Therapist Response: Provided support for Patient as she discussed her current mood. Stressed the importance of mood stabilization through learned coping skills and medication management.  Encouraged Patient to focus on her strengths and the positive aspects.  Plan: Return again in 2 weeks.  Diagnosis: Axis I: Depression    Axis II: No diagnosis    Candice Elkicole M Lestine Rahe, LCSW 04/29/2016

## 2016-05-20 ENCOUNTER — Encounter: Payer: Self-pay | Admitting: Psychiatry

## 2016-05-20 ENCOUNTER — Ambulatory Visit (INDEPENDENT_AMBULATORY_CARE_PROVIDER_SITE_OTHER): Payer: 59 | Admitting: Psychiatry

## 2016-05-20 VITALS — BP 122/85 | HR 109 | Temp 98.5°F | Ht 64.0 in | Wt 153.6 lb

## 2016-05-20 DIAGNOSIS — F331 Major depressive disorder, recurrent, moderate: Secondary | ICD-10-CM | POA: Diagnosis not present

## 2016-05-20 MED ORDER — ESCITALOPRAM OXALATE 20 MG PO TABS: 20.0000 mg | ORAL_TABLET | Freq: Every day | ORAL | 1 refills | Status: DC

## 2016-05-20 NOTE — Progress Notes (Signed)
Patient ID: Candice [name], female   DOB: 10-27-1985, 30 y.o.   MRN: [id number]   [facility] MD/PA/NP OP Progress Note  05/20/2016 4:19 PM Candice [name]  MRN:  [id number]  Subjective:  Patient returns for follow-up of her major depressive disorder. Patient reports today that her marriage is not doing well, she spent the past night at her parents house with her daughter. She reports he has not followed with his mental health care. States that she is continuing marital therapy along with her husband and it's the same. Overall  she is doing okay at work. Denies any suicidal thoughts. She takes xanax 0.25mg  once or twice a week.   Chief Complaint: marital stressors Chief Complaint    Follow-up; Medication Refill     Visit Diagnosis:     ICD-9-CM ICD-10-CM   1. Major depressive disorder, recurrent episode, moderate (HCC) 296.32 F33.1     Past Medical History:  Past Medical History:  Diagnosis Date  . Anxiety   . Depression   . Migraines    a. x 10+ years.  . Postpartum care following cesarean delivery (5/29) 02/04/2014  . Pre-syncope    a. 07/2015 [facility] ED visit, ? vasovagal, improved with IVF.  [name] Short cervix in second trimester, antepartum 11/16/2013    Past Surgical History:  Procedure Laterality Date  . CESAREAN SECTION N/A 02/03/2014   Procedure: Primary Cesarean Section Delivery Baby Girl @ 2244, Apgars 9/9;  Surgeon: Serita Kyle, MD;  Location: [facility]S;  Service: Obstetrics;  Laterality: N/A;  . CESAREAN SECTION    . WISDOM TOOTH EXTRACTION  2002   Family History:  Family History  Problem Relation Age of Onset  . Depression Mother   . Anxiety disorder Mother   . Heart attack Mother     NSTEMI @ age 76.  [name] Alcohol abuse Paternal Grandfather   . Sleep apnea Father     alive @ 25.  [name] Heart attack Maternal Grandfather   . Anxiety disorder Maternal Grandmother   . Depression Maternal Grandmother   . Diabetes Maternal Grandmother   . Hypertension Maternal  Grandmother    Social History:  Social History   Social History  . Marital status: Married    Spouse name: N/A  . Number of children: N/A  . Years of education: N/A   Social History Main Topics  . Smoking status: Never Smoker  . Smokeless tobacco: Never Used  . Alcohol use 0.0 oz/week     Comment: maybe two drinks/month.  . Drug use: No  . Sexual activity: Yes    Birth control/ protection: Implant   Other Topics Concern  . None   Social History Narrative   Lives in [place] with her husband and 51 month old dtr.  Does not routinely exercise.       Additional History:   Assessment:   Musculoskeletal: Strength & Muscle Tone: within normal limits Gait & Station: normal Patient leans: N/A  Psychiatric Specialty Exam: Anxiety  Symptoms include nervous/anxious behavior. Patient reports no insomnia or suicidal ideas.    Medication Refill     Review of Systems  Psychiatric/Behavioral: Negative for depression, hallucinations, memory loss, substance abuse and suicidal ideas. The patient is nervous/anxious. The patient does not have insomnia.        Weight gain over past several months  All other systems reviewed and are negative.   Blood pressure 122/85, pulse (!) 109, temperature 98.5 F (36.9 C), temperature source Oral, height 5'  4" (1.626 m), weight 153 lb 9.6 oz (69.7 kg), last menstrual period 05/06/2016, currently breastfeeding.Body mass index is 26.37 kg/m.  General Appearance: Neat and Well Groomed  Eye Contact:  Good  Speech:  Clear and Coherent and Normal Rate  Volume:  Normal  Mood:  Okay  Affect:  Bright smile  Thought Process:  Linear and Logical  Orientation:  Full (Time, Place, and Person)  Thought Content:  Negative  Suicidal Thoughts:  No  Homicidal Thoughts:  No  Memory:  Immediate;   Good Recent;   Good Remote;   Good  Judgement:  Good  Insight:  Good  Psychomotor Activity:  Negative  Concentration:  Good  Recall:  Good  Fund of  Knowledge: Good  Language: Good  Akathisia:  Negative  Handed:  Right unknown  AIMS (if indicated):  Not done  Assets:  Communication Skills Desire for Improvement Social Support Vocational/Educational  ADL's:  Intact  Cognition: WNL  Sleep:  Okay    Is the patient at risk to self?  No. Has the patient been a risk to self in the past 6 months?  No. Has the patient been a risk to self within the distant past?  No. Is the patient a risk to others?  No. Has the patient been a risk to others in the past 6 months?  No. Has the patient been a risk to others within the distant past?  No.  Current Medications: Current Outpatient Prescriptions  Medication Sig Dispense Refill  . ALPRAZolam (XANAX) 0.25 MG tablet Take 1 tablet (0.25 mg total) by mouth daily as needed for anxiety. 30 tablet 2  . cyclobenzaprine (FLEXERIL) 10 MG tablet Take 1 tablet (10 mg total) by mouth every 8 (eight) hours as needed for muscle spasms. 30 tablet 1  . escitalopram (LEXAPRO) 20 MG tablet Take 1 tablet (20 mg total) by mouth daily. 30 tablet 1  . Hydrocodone-Acetaminophen 10-300 MG TABS Take 1-2 tablets by mouth every 6 (six) hours as needed. 30 each 0  . naproxen sodium (ANAPROX DS) 550 MG tablet Take 1 tablet (550 mg total) by mouth as needed for headache. 60 tablet 1  . ondansetron (ZOFRAN-ODT) 4 MG disintegrating tablet Take 1 tablet (4 mg total) by mouth every 8 (eight) hours as needed for nausea or vomiting. 20 tablet 1  . SUMAtriptan (IMITREX) 100 MG tablet Take 1 tablet (100 mg total) by mouth as needed. 12 tablet 11  . SUMAtriptan 6 MG/0.5ML SOAJ Inject 1 cartridge into the skin as needed (migraine). May repeat in 2 hours.  No more than 2 doses of Imitrex per day. 12 cartridge 3  . topiramate (TOPAMAX) 25 MG tablet Take 1 tablet (25 mg total) by mouth 2 (two) times daily. 90 tablet 2   No current facility-administered medications for this visit.     Medical Decision Making:  Established Problem,  Stable/Improving (1) patient indicates some improvement on medication but wonders whether she could feel even better with a higher dose. He is denying any side effects on the Lexapro.  Treatment Plan Summary:Medication management   Major depressive disorder, recurrent, moderate In remission Continue Lexapro 20 mg and take it at bedtime Continue therapy with Nolon RodNicole Peacock.  Other anxiety disorder- alprazolam 0.25 mg daily as needed for anxiety/panic. Risk and benefits of been discussing patient's able to consent.  Patient will follow up in 4month or call before if needed  .Yuki Purves 05/20/2016, 4:19 PM

## 2016-06-06 ENCOUNTER — Encounter: Payer: Self-pay | Admitting: Physician Assistant

## 2016-06-06 ENCOUNTER — Ambulatory Visit (INDEPENDENT_AMBULATORY_CARE_PROVIDER_SITE_OTHER): Payer: 59 | Admitting: Physician Assistant

## 2016-06-06 VITALS — BP 118/78 | HR 93 | Wt 155.0 lb

## 2016-06-06 DIAGNOSIS — M62838 Other muscle spasm: Secondary | ICD-10-CM

## 2016-06-06 DIAGNOSIS — G43009 Migraine without aura, not intractable, without status migrainosus: Secondary | ICD-10-CM

## 2016-06-06 MED ORDER — TOPIRAMATE 25 MG PO TABS
75.0000 mg | ORAL_TABLET | Freq: Every day | ORAL | 3 refills | Status: DC
Start: 2016-06-06 — End: 2016-08-22

## 2016-06-06 MED ORDER — ONDANSETRON 4 MG PO TBDP: 4.0000 mg | ORAL_TABLET | Freq: Three times a day (TID) | ORAL | 1 refills | Status: DC | PRN

## 2016-06-06 MED ORDER — HYDROCODONE-ACETAMINOPHEN 10-300 MG PO TABS: 1.0000 | ORAL_TABLET | Freq: Four times a day (QID) | ORAL | 0 refills | Status: DC | PRN

## 2016-06-06 MED ORDER — NAPROXEN SODIUM 550 MG PO TABS: 550.0000 mg | ORAL_TABLET | ORAL | 1 refills | Status: DC | PRN

## 2016-06-06 NOTE — Progress Notes (Signed)
History:  Candice [name] is a 30 y.o. G1P1001 who presents to clinic today for followup of headaches.  Generallty HAs are caught early and relieved with NSAID.  She is having fewer HAs upon awakening and therefore not needing Imitrex as much.  Topamax was at 50mg  as rx would only allow for that.  Would like to increase to 75.  She feels the HAs are overall improved with more HA free days - but unable to quantify.   Pt under stress at home with marriage.  95 year old daughter, Candice [name], is wonderful.   HIT6:58 Number of days in the last 4 weeks with:  Severe headache: 0  Past Medical History:  Diagnosis Date  . Anxiety   . Depression   . Migraines    a. x 10+ years.  . Postpartum care following cesarean delivery (5/29) 02/04/2014  . Pre-syncope    a. 07/2015 [facility] ED visit, ? vasovagal, improved with IVF.  [name] Short cervix in second trimester, antepartum 11/16/2013    Social History   Social History  . Marital status: Married    Spouse name: N/A  . Number of children: N/A  . Years of education: N/A   Occupational History  . Not on file.   Social History Main Topics  . Smoking status: Never Smoker  . Smokeless tobacco: Never Used  . Alcohol use 0.0 oz/week     Comment: maybe two drinks/month.  . Drug use: No  . Sexual activity: Yes    Birth control/ protection: Implant   Other Topics Concern  . Not on file   Social History Narrative   Lives in [place] with her husband and 57 month old dtr.  Does not routinely exercise.        Family History  Problem Relation Age of Onset  . Depression Mother   . Anxiety disorder Mother   . Heart attack Mother     NSTEMI @ age 23.  [name] Alcohol abuse Paternal Grandfather   . Sleep apnea Father     alive @ 41.  [name] Heart attack Maternal Grandfather   . Anxiety disorder Maternal Grandmother   . Depression Maternal Grandmother   . Diabetes Maternal Grandmother   . Hypertension Maternal Grandmother     Allergies  Allergen Reactions   . Sulfa Antibiotics Other (See Comments)  . Sulfur Hives    Current Outpatient Prescriptions on File Prior to Visit  Medication Sig Dispense Refill  . ALPRAZolam (XANAX) 0.25 MG tablet Take 1 tablet (0.25 mg total) by mouth daily as needed for anxiety. 30 tablet 2  . cyclobenzaprine (FLEXERIL) 10 MG tablet Take 1 tablet (10 mg total) by mouth every 8 (eight) hours as needed for muscle spasms. 30 tablet 1  . escitalopram (LEXAPRO) 20 MG tablet Take 1 tablet (20 mg total) by mouth daily. 30 tablet 1  . Hydrocodone-Acetaminophen 10-300 MG TABS Take 1-2 tablets by mouth every 6 (six) hours as needed. 30 each 0  . naproxen sodium (ANAPROX DS) 550 MG tablet Take 1 tablet (550 mg total) by mouth as needed for headache. 60 tablet 1  . ondansetron (ZOFRAN-ODT) 4 MG disintegrating tablet Take 1 tablet (4 mg total) by mouth every 8 (eight) hours as needed for nausea or vomiting. 20 tablet 1  . SUMAtriptan (IMITREX) 100 MG tablet Take 1 tablet (100 mg total) by mouth as needed. 12 tablet 11  . SUMAtriptan 6 MG/0.5ML SOAJ Inject 1 cartridge into the skin as needed (migraine). May  repeat in 2 hours.  No more than 2 doses of Imitrex per day. 12 cartridge 3  . topiramate (TOPAMAX) 25 MG tablet Take 1 tablet (25 mg total) by mouth 2 (two) times daily. 90 tablet 2   No current facility-administered medications on file prior to visit.      Review of Systems:  All pertinent positive/negative included in HPI, all other review of systems are negative  Objective:  Physical Exam BP 118/78   Pulse 93   Wt 155 lb (70.3 kg)   LMP 05/29/2016   BMI 26.61 kg/m  CONSTITUTIONAL: Well-developed, well-nourished female in no acute distress.  EYES: EOM intact ENT: Normocephalic CARDIOVASCULAR: Regular rate and rhythm with no adventitious sounds.  RESPIRATORY: Normal rate. Clear to auscultation bilaterally.  ENDOCRINE: Normal thyroid.  MUSCULOSKELETAL: Normal ROM, strength equal bilaterally, muscle spasm  noted SKIN: Warm, dry without erythema  NEUROLOGICAL: Alert, oriented, CN II-XII grossly intact, Appropriate balance, No dysmetria, Sensation equal bilaterally, Romberg negative.   PSYCH: Normal behavior, mood   Assessment & Plan:  Assessment: Migraine without aura and without status migrainosus, not intractable  Muscle spasm    Plan: topamax 75mg  nightly (increased from 50) Naprosyn/Imitrex prn migraine Reminded of muscle relaxant which can be helpful Vicoden as last resort.  No further refills until after December. zofran prn nausea Follow-up in 3 months or sooner PRN  Bertram DenverKaren E Teague Clark, PA-C 06/06/2016 11:31 AM

## 2016-06-06 NOTE — Patient Instructions (Signed)

## 2016-06-09 ENCOUNTER — Ambulatory Visit (INDEPENDENT_AMBULATORY_CARE_PROVIDER_SITE_OTHER): Payer: 59 | Admitting: Licensed Clinical Social Worker

## 2016-06-09 DIAGNOSIS — F331 Major depressive disorder, recurrent, moderate: Secondary | ICD-10-CM | POA: Diagnosis not present
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
## 2016-06-19 ENCOUNTER — Ambulatory Visit: Payer: 59 | Admitting: Psychiatry

## 2016-07-01 ENCOUNTER — Ambulatory Visit (INDEPENDENT_AMBULATORY_CARE_PROVIDER_SITE_OTHER): Payer: 59 | Admitting: Psychiatry

## 2016-07-01 ENCOUNTER — Encounter: Payer: Self-pay | Admitting: Psychiatry

## 2016-07-01 VITALS — BP 119/86 | HR 101 | Temp 98.4°F | Wt 158.4 lb

## 2016-07-01 DIAGNOSIS — F331 Major depressive disorder, recurrent, moderate: Secondary | ICD-10-CM | POA: Diagnosis not present

## 2016-07-01 MED ORDER — ESCITALOPRAM OXALATE 20 MG PO TABS: 20.0000 mg | ORAL_TABLET | Freq: Every day | ORAL | 2 refills | Status: DC
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
## 2016-07-14 ENCOUNTER — Ambulatory Visit: Payer: 59 | Admitting: Licensed Clinical Social Worker

## 2016-08-22 ENCOUNTER — Ambulatory Visit (INDEPENDENT_AMBULATORY_CARE_PROVIDER_SITE_OTHER): Payer: 59 | Admitting: Physician Assistant

## 2016-08-22 ENCOUNTER — Encounter: Payer: Self-pay | Admitting: Physician Assistant

## 2016-08-22 VITALS — BP 114/80 | HR 94 | Ht 64.0 in | Wt 157.0 lb

## 2016-08-22 DIAGNOSIS — G43009 Migraine without aura, not intractable, without status migrainosus: Secondary | ICD-10-CM | POA: Diagnosis not present

## 2016-08-22 DIAGNOSIS — M62838 Other muscle spasm: Secondary | ICD-10-CM

## 2016-08-22 DIAGNOSIS — F331 Major depressive disorder, recurrent, moderate: Secondary | ICD-10-CM | POA: Diagnosis not present

## 2016-08-22 MED ORDER — PROMETHAZINE HCL 25 MG RE SUPP: 25.0000 mg | Freq: Four times a day (QID) | RECTAL | 0 refills | Status: DC | PRN

## 2016-08-22 MED ORDER — TOPIRAMATE 100 MG PO TABS: 100.0000 mg | ORAL_TABLET | Freq: Two times a day (BID) | ORAL | 3 refills | Status: DC

## 2016-08-22 MED ORDER — SUMATRIPTAN SUCCINATE 6 MG/0.5ML ~~LOC~~ SOAJ: 1.0000 | SUBCUTANEOUS | 3 refills | Status: DC | PRN

## 2016-08-22 MED ORDER — HYDROCODONE-ACETAMINOPHEN 10-300 MG PO TABS: 1.0000 | ORAL_TABLET | Freq: Four times a day (QID) | ORAL | 0 refills | Status: DC | PRN

## 2016-08-22 MED ORDER — PROMETHAZINE HCL 25 MG PO TABS: 25.0000 mg | ORAL_TABLET | Freq: Three times a day (TID) | ORAL | 0 refills | Status: DC | PRN

## 2016-08-22 MED ORDER — SUMATRIPTAN SUCCINATE 100 MG PO TABS: 100.0000 mg | ORAL_TABLET | ORAL | 11 refills | Status: DC | PRN

## 2016-08-22 NOTE — Progress Notes (Signed)
History:  Candice [name] is a 30 y.o. G1P1001 who presents to clinic today for HA.  She feels that the headaches have been some improved in terms of severity with fewer moderate HAs but more mild HAs.  Although, for the last 2 days it has been severe.  She took Imitrex 3 hours ago and is just now feeling like it may be starting to work.  She is guessing her period may be about to start as her HA gets worse around that time.  Her periods are unpredictable due to Nexplanon.  She is having to use vicodin for rescue.       Past Medical History:  Diagnosis Date  . Anxiety   . Depression   . Migraines    a. x 10+ years.  . Postpartum care following cesarean delivery (5/29) 02/04/2014  . Pre-syncope    a. 07/2015 [facility] ED visit, ? vasovagal, improved with IVF.  [name] Short cervix in second trimester, antepartum 11/16/2013    Social History   Social History  . Marital status: Married    Spouse name: N/A  . Number of children: N/A  . Years of education: N/A   Occupational History  . Not on file.   Social History Main Topics  . Smoking status: Never Smoker  . Smokeless tobacco: Never Used  . Alcohol use 0.0 oz/week     Comment: maybe two drinks/month.  . Drug use: No  . Sexual activity: Yes    Birth control/ protection: Implant   Other Topics Concern  . Not on file   Social History Narrative   Lives in [place] with her husband and 16 month old dtr.  Does not routinely exercise.        Family History  Problem Relation Age of Onset  . Depression Mother   . Anxiety disorder Mother   . Heart attack Mother     NSTEMI @ age 31.  [name] Alcohol abuse Paternal Grandfather   . Sleep apnea Father     alive @ 62.  [name] Heart attack Maternal Grandfather   . Anxiety disorder Maternal Grandmother   . Depression Maternal Grandmother   . Diabetes Maternal Grandmother   . Hypertension Maternal Grandmother     Allergies  Allergen Reactions  . Sulfa Antibiotics Other (See Comments)  .  Sulfur Hives    Current Outpatient Prescriptions on File Prior to Visit  Medication Sig Dispense Refill  . ALPRAZolam (XANAX) 0.25 MG tablet Take 1 tablet (0.25 mg total) by mouth daily as needed for anxiety. 30 tablet 2  . cyclobenzaprine (FLEXERIL) 10 MG tablet Take 1 tablet (10 mg total) by mouth every 8 (eight) hours as needed for muscle spasms. 30 tablet 1  . escitalopram (LEXAPRO) 20 MG tablet Take 1 tablet (20 mg total) by mouth daily. 30 tablet 2  . Hydrocodone-Acetaminophen 10-300 MG TABS Take 1-2 tablets by mouth every 6 (six) hours as needed. 30 each 0  . naproxen sodium (ANAPROX DS) 550 MG tablet Take 1 tablet (550 mg total) by mouth as needed for headache. 60 tablet 1  . ondansetron (ZOFRAN-ODT) 4 MG disintegrating tablet Take 1 tablet (4 mg total) by mouth every 8 (eight) hours as needed for nausea or vomiting. 20 tablet 1  . SUMAtriptan (IMITREX) 100 MG tablet Take 1 tablet (100 mg total) by mouth as needed. 12 tablet 11  . SUMAtriptan 6 MG/0.5ML SOAJ Inject 1 cartridge into the skin as needed (migraine). May repeat in 2 hours.  No more than 2 doses of Imitrex per day. 12 cartridge 3  . topiramate (TOPAMAX) 25 MG tablet Take 3 tablets (75 mg total) by mouth daily. 90 tablet 3   No current facility-administered medications on file prior to visit.      Review of Systems:  All pertinent positive/negative included in HPI, all other review of systems are negative   Objective:  Physical Exam BP 114/80   Pulse 94   Ht 5\' 4"  (1.626 m)   Wt 157 lb (71.2 kg)   BMI 26.95 kg/m  CONSTITUTIONAL: Well-developed, well-nourished female in no acute distress.  EYES: EOM intact ENT: Normocephalic CARDIOVASCULAR: Regular rate and rhythm with no adventitious sounds.  RESPIRATORY: Normal rate. Clear to auscultation bilaterally.   MUSCULOSKELETAL: Normal ROM, strength equal bilaterally, muscle spasm noted bilat cervical paraspinal SKIN: Warm, dry without erythema  NEUROLOGICAL: Alert,  oriented, CN II-XII grossly intact, Appropriate balance PSYCH: Normal behavior, mood   Assessment & Plan:  Assessment: 1. Migraine without aura and without status migrainosus, not intractable   2. Muscle spasm   3. Depression, major, recurrent, moderate (HCC)   No improvement in above diagnoses   Plan: Will trial slowly increasing Topamax up to 200mg  daily as tolerated.  Patient will be very cautious with this as she had significant trouble before with increasing dose.   Continue Imitrex.  Advised take early.   Injectable provided for this.  Phenergan for nausea but also for rescue when migraine is unrelenting.  Will need to sleep and not be responsible for small children or driving while using. Norco if phenergan unsuccessful.  Limit use.   Encouraged to use Flexeril more liberally to give help with muscle spasm on a more preventive/long term basis.     Follow-up in 3 months or sooner PRN  Bertram DenverKaren E Teague Clark, PA-C 08/22/2016 11:20 AM

## 2016-09-03 DIAGNOSIS — H31102 Choroidal degeneration, unspecified, left eye: Secondary | ICD-10-CM | POA: Diagnosis not present

## 2016-09-03 DIAGNOSIS — H43393 Other vitreous opacities, bilateral: Secondary | ICD-10-CM | POA: Diagnosis not present

## 2016-09-03 DIAGNOSIS — H40013 Open angle with borderline findings, low risk, bilateral: Secondary | ICD-10-CM | POA: Diagnosis not present

## 2016-09-03 DIAGNOSIS — H5213 Myopia, bilateral: Secondary | ICD-10-CM | POA: Diagnosis not present

## 2016-10-02 ENCOUNTER — Telehealth: Payer: Self-pay

## 2016-10-02 NOTE — Telephone Encounter (Signed)
left message on doctor line to refill lexapro 20mg  with no additional refills

## 2016-10-02 NOTE — Telephone Encounter (Signed)
spoke with patient in regards to r/s appt due to dr. Daleen Boravi not back in office.  pt stated that she would need a refill because she would not have enough.  Pt appt was r/s

## 2016-10-06 ENCOUNTER — Telehealth: Payer: Self-pay | Admitting: Radiology

## 2016-10-06 NOTE — Telephone Encounter (Signed)
Left message on cell phone voicemail to call back to  schedule 3 month headache f/u with Teague-Clark.

## 2016-10-09 ENCOUNTER — Ambulatory Visit: Payer: 59 | Admitting: Psychiatry

## 2016-10-28 ENCOUNTER — Ambulatory Visit (INDEPENDENT_AMBULATORY_CARE_PROVIDER_SITE_OTHER): Payer: 59 | Admitting: Psychiatry

## 2016-10-28 ENCOUNTER — Encounter: Payer: Self-pay | Admitting: Psychiatry

## 2016-10-28 VITALS — BP 112/81 | HR 109 | Temp 98.6°F | Wt 159.6 lb

## 2016-10-28 DIAGNOSIS — F331 Major depressive disorder, recurrent, moderate: Secondary | ICD-10-CM | POA: Diagnosis not present

## 2016-10-28 MED ORDER — ALPRAZOLAM 0.25 MG PO TABS: 0.2500 mg | ORAL_TABLET | Freq: Every day | ORAL | 0 refills | Status: DC | PRN

## 2016-10-28 MED ORDER — ESCITALOPRAM OXALATE 20 MG PO TABS: 20.0000 mg | ORAL_TABLET | Freq: Every day | ORAL | 2 refills | Status: DC

## 2016-10-28 NOTE — Progress Notes (Signed)
Patient ID: Candice [name], female   DOB: 15-Jun-1986, 31 y.o.   MRN: [id number]   [facility] MD/PA/NP OP Progress Note  10/28/2016 2:15 PM Candice [name]  MRN:  [id number]  Subjective:  Patient returns for follow-up of her major depressive disorder. Reports multiple family stressors over past month. States things are same with her husband. Her husband's job changed to 3rd shift and it affects his mood. Her daughter was sick too and is better. Reports doing ok with her mood, fair sleep and appetite.   Chief Complaint: Family stressors Chief Complaint    Follow-up; Medication Refill     Visit Diagnosis:     ICD-9-CM ICD-10-CM   1. Major depressive disorder, recurrent episode, moderate (HCC) 296.32 F33.1     Past Medical History:  Past Medical History:  Diagnosis Date  . Anxiety   . Depression   . Migraines    a. x 10+ years.  . Postpartum care following cesarean delivery (5/29) 02/04/2014  . Pre-syncope    a. 07/2015 [facility] ED visit, ? vasovagal, improved with IVF.  [name] Short cervix in second trimester, antepartum 11/16/2013    Past Surgical History:  Procedure Laterality Date  . CESAREAN SECTION N/A 02/03/2014   Procedure: Primary Cesarean Section Delivery Baby Girl @ 2244, Apgars 9/9;  Surgeon: Serita Kyle, MD;  Location: [facility];  Service: Obstetrics;  Laterality: N/A;  . CESAREAN SECTION    . WISDOM TOOTH EXTRACTION  2002   Family History:  Family History  Problem Relation Age of Onset  . Depression Mother   . Anxiety disorder Mother   . Heart attack Mother     NSTEMI @ age 65.  [name] Alcohol abuse Paternal Grandfather   . Sleep apnea Father     alive @ 19.  [name] Heart attack Maternal Grandfather   . Anxiety disorder Maternal Grandmother   . Depression Maternal Grandmother   . Diabetes Maternal Grandmother   . Hypertension Maternal Grandmother    Social History:  Social History   Social History  . Marital status: Married    Spouse name: N/A  . Number of  children: N/A  . Years of education: N/A   Social History Main Topics  . Smoking status: Never Smoker  . Smokeless tobacco: Never Used  . Alcohol use 0.0 oz/week     Comment: maybe two drinks/month.  . Drug use: No  . Sexual activity: Yes    Birth control/ protection: Implant   Other Topics Concern  . None   Social History Narrative   Lives in [place] with her husband and 72 month old dtr.  Does not routinely exercise.       Additional History:   Assessment:   Musculoskeletal: Strength & Muscle Tone: within normal limits Gait & Station: normal Patient leans: N/A  Psychiatric Specialty Exam: Medication Refill   Anxiety  Patient reports no insomnia, nervous/anxious behavior or suicidal ideas.      Review of Systems  Psychiatric/Behavioral: Negative for depression, hallucinations, memory loss, substance abuse and suicidal ideas. The patient is not nervous/anxious and does not have insomnia.        Weight gain over past several months  All other systems reviewed and are negative.   Blood pressure 112/81, pulse (!) 109, temperature 98.6 F (37 C), temperature source Oral, weight 159 lb 9.6 oz (72.4 kg), currently breastfeeding.Body mass index is 27.4 kg/m.  General Appearance: Neat and Well Groomed  Eye Contact:  Good  Speech:  Clear and Coherent and Normal Rate  Volume:  Normal  Mood:  Okay  Affect:  Bright smile  Thought Process:  Linear and Logical  Orientation:  Full (Time, Place, and Person)  Thought Content:  Negative  Suicidal Thoughts:  No  Homicidal Thoughts:  No  Memory:  Immediate;   Good Recent;   Good Remote;   Good  Judgement:  Good  Insight:  Good  Psychomotor Activity:  Negative  Concentration:  Good  Recall:  Good  Fund of Knowledge: Good  Language: Good  Akathisia:  Negative  Handed:  Right unknown  AIMS (if indicated):  Not done  Assets:  Communication Skills Desire for Improvement Social Support Vocational/Educational  ADL's:   Intact  Cognition: WNL  Sleep:  Okay    Is the patient at risk to self?  No. Has the patient been a risk to self in the past 6 months?  No. Has the patient been a risk to self within the distant past?  No. Is the patient a risk to others?  No. Has the patient been a risk to others in the past 6 months?  No. Has the patient been a risk to others within the distant past?  No.  Current Medications: Current Outpatient Prescriptions  Medication Sig Dispense Refill  . ALPRAZolam (XANAX) 0.25 MG tablet Take 1 tablet (0.25 mg total) by mouth daily as needed for anxiety. 30 tablet 2  . cyclobenzaprine (FLEXERIL) 10 MG tablet Take 1 tablet (10 mg total) by mouth every 8 (eight) hours as needed for muscle spasms. 30 tablet 1  . escitalopram (LEXAPRO) 20 MG tablet Take 1 tablet (20 mg total) by mouth daily. 30 tablet 2  . Hydrocodone-Acetaminophen 10-300 MG TABS Take 1-2 tablets by mouth every 6 (six) hours as needed. 30 each 0  . naproxen sodium (ANAPROX DS) 550 MG tablet Take 1 tablet (550 mg total) by mouth as needed for headache. 60 tablet 1  . ondansetron (ZOFRAN-ODT) 4 MG disintegrating tablet Take 1 tablet (4 mg total) by mouth every 8 (eight) hours as needed for nausea or vomiting. 20 tablet 1  . promethazine (PHENERGAN) 25 MG suppository Place 1 suppository (25 mg total) rectally every 6 (six) hours as needed for nausea or vomiting. 12 each 0  . promethazine (PHENERGAN) 25 MG tablet Take 1 tablet (25 mg total) by mouth every 8 (eight) hours as needed for nausea or vomiting. 30 tablet 0  . SUMAtriptan (IMITREX) 100 MG tablet Take 1 tablet (100 mg total) by mouth as needed. 12 tablet 11  . SUMAtriptan 6 MG/0.5ML SOAJ Inject 1 cartridge into the skin as needed (migraine). May repeat in 2 hours.  No more than 2 doses of Imitrex per day. 12 Cartridge 3  . topiramate (TOPAMAX) 100 MG tablet Take 1 tablet (100 mg total) by mouth 2 (two) times daily. 30 tablet 3   No current facility-administered  medications for this visit.     Medical Decision Making:  Established Problem, Stable/Improving (1) patient indicates some improvement on medication but wonders whether she could feel even better with a higher dose. she is denying any side effects on the Lexapro.  Treatment Plan Summary:Medication management   Major depressive disorder, recurrent, moderate In remission Continue Lexapro 20 mg and take it at bedtime Continue therapy with Nolon Rod.  Other anxiety disorder- alprazolam 0.25 mg daily as needed for anxiety/panic.  Patient will follow up in 3 month or call before if needed  .Eartha Vonbehren 10/28/2016,  2:15 PM

## 2016-12-19 ENCOUNTER — Encounter: Payer: 59 | Admitting: Physician Assistant

## 2016-12-19 ENCOUNTER — Telehealth: Payer: Self-pay | Admitting: Physician Assistant

## 2016-12-19 ENCOUNTER — Encounter: Payer: Self-pay | Admitting: Physician Assistant

## 2016-12-19 ENCOUNTER — Ambulatory Visit (INDEPENDENT_AMBULATORY_CARE_PROVIDER_SITE_OTHER): Payer: 59 | Admitting: Physician Assistant

## 2016-12-19 VITALS — BP 121/83 | HR 71 | Resp 18 | Ht 64.0 in | Wt 159.0 lb

## 2016-12-19 DIAGNOSIS — F331 Major depressive disorder, recurrent, moderate: Secondary | ICD-10-CM | POA: Diagnosis not present

## 2016-12-19 DIAGNOSIS — F439 Reaction to severe stress, unspecified: Secondary | ICD-10-CM | POA: Diagnosis not present

## 2016-12-19 DIAGNOSIS — M62838 Other muscle spasm: Secondary | ICD-10-CM

## 2016-12-19 DIAGNOSIS — G43009 Migraine without aura, not intractable, without status migrainosus: Secondary | ICD-10-CM

## 2016-12-19 MED ORDER — HYDROCODONE-ACETAMINOPHEN 10-300 MG PO TABS: 1.0000 | ORAL_TABLET | Freq: Four times a day (QID) | ORAL | 0 refills | Status: DC | PRN

## 2016-12-19 MED ORDER — CYCLOBENZAPRINE HCL 10 MG PO TABS: 10.0000 mg | ORAL_TABLET | Freq: Three times a day (TID) | ORAL | 1 refills | Status: DC | PRN

## 2016-12-19 MED ORDER — PROMETHAZINE HCL 25 MG PO TABS: 25.0000 mg | ORAL_TABLET | Freq: Three times a day (TID) | ORAL | 0 refills | Status: DC | PRN

## 2016-12-19 MED ORDER — ELETRIPTAN HYDROBROMIDE 40 MG PO TABS: 40.0000 mg | ORAL_TABLET | ORAL | 2 refills | Status: DC | PRN

## 2016-12-19 MED ORDER — TOPIRAMATE 25 MG PO TABS: 200.0000 mg | ORAL_TABLET | Freq: Every day | ORAL | 2 refills | Status: DC

## 2016-12-19 NOTE — Patient Instructions (Signed)
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
## 2017-01-05 NOTE — Telephone Encounter (Signed)
Pt given phone number that she requested during her visit.  All questions answered.

## 2017-01-26 ENCOUNTER — Ambulatory Visit: Payer: 59 | Admitting: Psychiatry

## 2017-01-28 ENCOUNTER — Encounter: Payer: Self-pay | Admitting: Psychiatry

## 2017-01-28 ENCOUNTER — Ambulatory Visit (INDEPENDENT_AMBULATORY_CARE_PROVIDER_SITE_OTHER): Payer: 59 | Admitting: Psychiatry

## 2017-01-28 VITALS — BP 122/82 | HR 88 | Temp 98.3°F | Wt 162.6 lb

## 2017-01-28 DIAGNOSIS — F331 Major depressive disorder, recurrent, moderate: Secondary | ICD-10-CM

## 2017-01-28 MED ORDER — ALPRAZOLAM 0.25 MG PO TABS: 0.2500 mg | ORAL_TABLET | Freq: Every day | ORAL | 0 refills | Status: DC | PRN

## 2017-01-28 MED ORDER — ESCITALOPRAM OXALATE 20 MG PO TABS: 20.0000 mg | ORAL_TABLET | Freq: Every day | ORAL | 2 refills | Status: DC

## 2017-01-28 NOTE — Progress Notes (Signed)
Patient ID: Candice [name], female   DOB: 1986-03-07, 31 y.o.   MRN: [id number]   [facility] MD/PA/NP OP Progress Note  01/28/2017 2:24 PM Candice [name]  MRN:  [id number]  Subjective:  Patient returns for follow-up of her major depressive disorder. Reports doing well overall. Things have been stressful at home with her husband. He had missed a few days of his meds and that led to an episode . He is doing better now. She reports fair sleep and appetite.  Chief Complaint: Family stressors Chief Complaint    Follow-up; Medication Refill     Visit Diagnosis:     ICD-9-CM ICD-10-CM   1. Major depressive disorder, recurrent episode, moderate (HCC) 296.32 F33.1     Past Medical History:  Past Medical History:  Diagnosis Date  . Anxiety   . Depression   . Migraines    a. x 10+ years.  . Postpartum care following cesarean delivery (5/29) 02/04/2014  . Pre-syncope    a. 07/2015 [facility] ED visit, ? vasovagal, improved with IVF.  [name] Short cervix in second trimester, antepartum 11/16/2013    Past Surgical History:  Procedure Laterality Date  . CESAREAN SECTION N/A 02/03/2014   Procedure: Primary Cesarean Section Delivery Baby Girl @ 2244, Apgars 9/9;  Surgeon: Serita Kyle, MD;  Location: [facility]S;  Service: Obstetrics;  Laterality: N/A;  . CESAREAN SECTION    . WISDOM TOOTH EXTRACTION  2002   Family History:  Family History  Problem Relation Age of Onset  . Depression Mother   . Anxiety disorder Mother   . Heart attack Mother        NSTEMI @ age 79.  [name] Alcohol abuse Paternal Grandfather   . Sleep apnea Father        alive @ 109.  [name] Heart attack Maternal Grandfather   . Anxiety disorder Maternal Grandmother   . Depression Maternal Grandmother   . Diabetes Maternal Grandmother   . Hypertension Maternal Grandmother    Social History:  Social History   Social History  . Marital status: Married    Spouse name: N/A  . Number of children: N/A  . Years of education: N/A    Social History Main Topics  . Smoking status: Never Smoker  . Smokeless tobacco: Never Used  . Alcohol use 0.0 oz/week     Comment: maybe two drinks/month.  . Drug use: No  . Sexual activity: Yes    Birth control/ protection: Implant   Other Topics Concern  . None   Social History Narrative   Lives in [place] with her husband and 57 month old dtr.  Does not routinely exercise.       Additional History:   Assessment:   Musculoskeletal: Strength & Muscle Tone: within normal limits Gait & Station: normal Patient leans: N/A  Psychiatric Specialty Exam: Medication Refill   Anxiety  Patient reports no insomnia, nervous/anxious behavior or suicidal ideas.      Review of Systems  Psychiatric/Behavioral: Negative for depression, hallucinations, memory loss, substance abuse and suicidal ideas. The patient is not nervous/anxious and does not have insomnia.        Weight gain over past several months  All other systems reviewed and are negative.   Blood pressure 122/82, pulse 88, temperature 98.3 F (36.8 C), temperature source Oral, weight 162 lb 9.6 oz (73.8 kg), last menstrual period 01/28/2017, currently breastfeeding.Body mass index is 27.91 kg/m.  General Appearance: Neat and Well Groomed  Eye Contact:  Good  Speech:  Clear and Coherent and Normal Rate  Volume:  Normal  Mood:  Okay  Affect:  Bright smile  Thought Process:  Linear and Logical  Orientation:  Full (Time, Place, and Person)  Thought Content:  Negative  Suicidal Thoughts:  No  Homicidal Thoughts:  No  Memory:  Immediate;   Good Recent;   Good Remote;   Good  Judgement:  Good  Insight:  Good  Psychomotor Activity:  Negative  Concentration:  Good  Recall:  Good  Fund of Knowledge: Good  Language: Good  Akathisia:  Negative  Handed:  Right unknown  AIMS (if indicated):  Not done  Assets:  Communication Skills Desire for Improvement Social Support Vocational/Educational  ADL's:  Intact   Cognition: WNL  Sleep:  Okay    Is the patient at risk to self?  No. Has the patient been a risk to self in the past 6 months?  No. Has the patient been a risk to self within the distant past?  No. Is the patient a risk to others?  No. Has the patient been a risk to others in the past 6 months?  No. Has the patient been a risk to others within the distant past?  No.  Current Medications: Current Outpatient Prescriptions  Medication Sig Dispense Refill  . ALPRAZolam (XANAX) 0.25 MG tablet Take 1 tablet (0.25 mg total) by mouth daily as needed for anxiety. 30 tablet 0  . cyclobenzaprine (FLEXERIL) 10 MG tablet Take 1 tablet (10 mg total) by mouth every 8 (eight) hours as needed for muscle spasms. 30 tablet 1  . eletriptan (RELPAX) 40 MG tablet Take 1 tablet (40 mg total) by mouth as needed for migraine or headache. May repeat in 2 hours if headache persists or recurs. 10 tablet 2  . escitalopram (LEXAPRO) 20 MG tablet Take 1 tablet (20 mg total) by mouth daily. 30 tablet 2  . Hydrocodone-Acetaminophen 10-300 MG TABS Take 1-2 tablets by mouth every 6 (six) hours as needed. 30 each 0  . naproxen sodium (ANAPROX DS) 550 MG tablet Take 1 tablet (550 mg total) by mouth as needed for headache. 60 tablet 1  . ondansetron (ZOFRAN-ODT) 4 MG disintegrating tablet Take 1 tablet (4 mg total) by mouth every 8 (eight) hours as needed for nausea or vomiting. 20 tablet 1  . promethazine (PHENERGAN) 25 MG suppository Place 1 suppository (25 mg total) rectally every 6 (six) hours as needed for nausea or vomiting. 12 each 0  . promethazine (PHENERGAN) 25 MG tablet Take 1 tablet (25 mg total) by mouth every 8 (eight) hours as needed for nausea or vomiting. 30 tablet 0  . SUMAtriptan (IMITREX) 100 MG tablet Take 1 tablet (100 mg total) by mouth as needed. 12 tablet 11  . SUMAtriptan 6 MG/0.5ML SOAJ Inject 1 cartridge into the skin as needed (migraine). May repeat in 2 hours.  No more than 2 doses of Imitrex per  day. 12 Cartridge 3  . topiramate (TOPAMAX) 25 MG tablet Take 8 tablets (200 mg total) by mouth daily. Increase dose weekly up to 200mg  240 tablet 2   No current facility-administered medications for this visit.     Medical Decision Making:  Established Problem, Stable/Improving (1) patient indicates some improvement on medication but wonders whether she could feel even better with a higher dose. she is denying any side effects on the Lexapro.  Treatment Plan Summary:Medication management   Major depressive disorder, recurrent, moderate In remission Continue Lexapro  20 mg and take it at bedtime Continue therapy with Nolon RodNicole Peacock.  Other anxiety disorder- alprazolam 0.25 mg daily as needed for anxiety/panic.  Patient will follow up in 3 month or call before if needed  .Darlys Buis 01/28/2017, 2:24 PM

## 2017-03-18 DIAGNOSIS — Z3046 Encounter for surveillance of implantable subdermal contraceptive: Secondary | ICD-10-CM | POA: Diagnosis not present

## 2017-03-18 DIAGNOSIS — Z6827 Body mass index (BMI) 27.0-27.9, adult: Secondary | ICD-10-CM | POA: Diagnosis not present

## 2017-03-18 DIAGNOSIS — Z1151 Encounter for screening for human papillomavirus (HPV): Secondary | ICD-10-CM | POA: Diagnosis not present

## 2017-03-18 DIAGNOSIS — Z01419 Encounter for gynecological examination (general) (routine) without abnormal findings: Secondary | ICD-10-CM | POA: Diagnosis not present

## 2017-03-26 ENCOUNTER — Encounter (INDEPENDENT_AMBULATORY_CARE_PROVIDER_SITE_OTHER): Payer: Self-pay | Admitting: *Deleted

## 2017-04-22 ENCOUNTER — Encounter: Payer: Self-pay | Admitting: Physician Assistant

## 2017-04-22 ENCOUNTER — Ambulatory Visit (INDEPENDENT_AMBULATORY_CARE_PROVIDER_SITE_OTHER): Payer: 59 | Admitting: Psychiatry

## 2017-04-22 ENCOUNTER — Encounter: Payer: Self-pay | Admitting: Psychiatry

## 2017-04-22 VITALS — BP 116/78 | HR 101 | Temp 98.8°F | Wt 161.2 lb

## 2017-04-22 DIAGNOSIS — F331 Major depressive disorder, recurrent, moderate: Secondary | ICD-10-CM | POA: Diagnosis not present

## 2017-04-22 MED ORDER — ESCITALOPRAM OXALATE 20 MG PO TABS: 20.0000 mg | ORAL_TABLET | Freq: Every day | ORAL | 2 refills | Status: DC

## 2017-04-22 NOTE — Progress Notes (Signed)
Patient ID: Candice [name], female   DOB: 08/08/1986, 31 y.o.   MRN: [id number]   [facility]/NP OP Progress Note  04/22/2017 2:09 PM Candice [name]  MRN:  [id number]  Subjective:  Patient returns for follow-up of her major depressive disorder. Reports doing well overall. Looking forward to a vacation next week. She has been eating better and has lost 5 lbs. Some family issues continue to surface with her husband`s brother. States her relationship with her husband is better, states he is trying to make their relationship work. Doing well at work. Fair sleep and appetite.   Chief Complaint: doing better Chief Complaint    Follow-up; Medication Refill     Visit Diagnosis:     ICD-10-CM   1. Major depressive disorder, recurrent episode, moderate (HCC) F33.1     Past Medical History:  Past Medical History:  Diagnosis Date  . Anxiety   . Depression   . Migraines    a. x 10+ years.  . Postpartum care following cesarean delivery (5/29) 02/04/2014  . Pre-syncope    a. 07/2015 [facility] ED visit, ? vasovagal, improved with IVF.  [name]. Short cervix in second trimester, antepartum 11/16/2013    Past Surgical History:  Procedure Laterality Date  . CESAREAN SECTION N/A 02/03/2014   Procedure: Primary Cesarean Section Delivery Baby Girl @ 2244, Apgars 9/9;  Surgeon: Serita KyleSheronette A Cousins, MD;  Location: [facility];  Service: Obstetrics;  Laterality: N/A;  . CESAREAN SECTION    . WISDOM TOOTH EXTRACTION  2002   Family History:  Family History  Problem Relation Age of Onset  . Depression Mother   . Anxiety disorder Mother   . Heart attack Mother        NSTEMI @ age 33.  [name]. Alcohol abuse Paternal Grandfather   . Sleep apnea Father        alive @ 4556.  [name]. Heart attack Maternal Grandfather   . Anxiety disorder Maternal Grandmother   . Depression Maternal Grandmother   . Diabetes Maternal Grandmother   . Hypertension Maternal Grandmother    Social History:  Social History   Social History  .  Marital status: Married    Spouse name: N/A  . Number of children: N/A  . Years of education: N/A   Social History Main Topics  . Smoking status: Never Smoker  . Smokeless tobacco: Never Used  . Alcohol use 0.0 oz/week     Comment: maybe two drinks/month.  . Drug use: No  . Sexual activity: Yes    Birth control/ protection: Implant   Other Topics Concern  . None   Social History Narrative   Lives in [place] with her husband and 7017 month old dtr.  Does not routinely exercise.       Additional History:   Assessment:   Musculoskeletal: Strength & Muscle Tone: within normal limits Gait & Station: normal Patient leans: N/A  Psychiatric Specialty Exam: Medication Refill   Anxiety  Patient reports no insomnia, nervous/anxious behavior or suicidal ideas.      Review of Systems  Psychiatric/Behavioral: Negative for depression, hallucinations, memory loss, substance abuse and suicidal ideas. The patient is not nervous/anxious and does not have insomnia.        Weight gain over past several months  All other systems reviewed and are negative.   Blood pressure 116/78, pulse (!) 101, temperature 98.8 F (37.1 C), temperature source Oral, weight 161 lb 3.2 oz (73.1 kg), currently breastfeeding.Body mass index is 27.67  kg/m.  General Appearance: Neat and Well Groomed  Eye Contact:  Good  Speech:  Clear and Coherent and Normal Rate  Volume:  Normal  Mood:  better  Affect:  Bright smile  Thought Process:  Linear and Logical  Orientation:  Full (Time, Place, and Person)  Thought Content:  Negative  Suicidal Thoughts:  No  Homicidal Thoughts:  No  Memory:  Immediate;   Good Recent;   Good Remote;   Good  Judgement:  Good  Insight:  Good  Psychomotor Activity:  Negative  Concentration:  Good  Recall:  Good  Fund of Knowledge: Good  Language: Good  Akathisia:  Negative  Handed:  Right unknown  AIMS (if indicated):  Not done  Assets:  Communication Skills Desire for  Improvement Social Support Vocational/Educational  ADL's:  Intact  Cognition: WNL  Sleep:  Okay    Is the patient at risk to self?  No. Has the patient been a risk to self in the past 6 months?  No. Has the patient been a risk to self within the distant past?  No. Is the patient a risk to others?  No. Has the patient been a risk to others in the past 6 months?  No. Has the patient been a risk to others within the distant past?  No.  Current Medications: Current Outpatient Prescriptions  Medication Sig Dispense Refill  . ALPRAZolam (XANAX) 0.25 MG tablet Take 1 tablet (0.25 mg total) by mouth daily as needed for anxiety. 30 tablet 0  . cyclobenzaprine (FLEXERIL) 10 MG tablet Take 1 tablet (10 mg total) by mouth every 8 (eight) hours as needed for muscle spasms. 30 tablet 1  . eletriptan (RELPAX) 40 MG tablet Take 1 tablet (40 mg total) by mouth as needed for migraine or headache. May repeat in 2 hours if headache persists or recurs. 10 tablet 2  . escitalopram (LEXAPRO) 20 MG tablet Take 1 tablet (20 mg total) by mouth daily. 30 tablet 2  . Hydrocodone-Acetaminophen 10-300 MG TABS Take 1-2 tablets by mouth every 6 (six) hours as needed. 30 each 0  . naproxen sodium (ANAPROX DS) 550 MG tablet Take 1 tablet (550 mg total) by mouth as needed for headache. 60 tablet 1  . ondansetron (ZOFRAN-ODT) 4 MG disintegrating tablet Take 1 tablet (4 mg total) by mouth every 8 (eight) hours as needed for nausea or vomiting. 20 tablet 1  . promethazine (PHENERGAN) 25 MG suppository Place 1 suppository (25 mg total) rectally every 6 (six) hours as needed for nausea or vomiting. 12 each 0  . promethazine (PHENERGAN) 25 MG tablet Take 1 tablet (25 mg total) by mouth every 8 (eight) hours as needed for nausea or vomiting. 30 tablet 0  . SUMAtriptan (IMITREX) 100 MG tablet Take 1 tablet (100 mg total) by mouth as needed. 12 tablet 11  . SUMAtriptan 6 MG/0.5ML SOAJ Inject 1 cartridge into the skin as needed  (migraine). May repeat in 2 hours.  No more than 2 doses of Imitrex per day. 12 Cartridge 3  . topiramate (TOPAMAX) 25 MG tablet Take 8 tablets (200 mg total) by mouth daily. Increase dose weekly up to 200mg  240 tablet 2  . norethindrone (MICRONOR,CAMILA,ERRIN) 0.35 MG tablet   0   No current facility-administered medications for this visit.     Medical Decision Making:  Established Problem, Stable/Improving (1) patient indicates some improvement on medication but wonders whether she could feel even better with a higher dose. she is denying  any side effects on the Lexapro.  Treatment Plan Summary:Medication management   Major depressive disorder, recurrent, moderate In remission Continue Lexapro 20 mg and take it at bedtime Continue therapy with Nolon Rod.  Other anxiety disorder- alprazolam 0.25 mg daily as needed for anxiety/panic.  Patient will follow up in 3 month or call before if needed  .Demauri Advincula 04/22/2017, 2:09 PM

## 2017-04-23 ENCOUNTER — Other Ambulatory Visit: Payer: Self-pay

## 2017-04-23 MED ORDER — ONDANSETRON 4 MG PO TBDP: 4.0000 mg | ORAL_TABLET | Freq: Three times a day (TID) | ORAL | 1 refills | Status: DC | PRN

## 2017-05-20 ENCOUNTER — Other Ambulatory Visit: Payer: Self-pay | Admitting: Physician Assistant

## 2017-05-22 ENCOUNTER — Encounter: Payer: 59 | Admitting: Physician Assistant

## 2017-05-22 ENCOUNTER — Telehealth: Payer: Self-pay | Admitting: Physician Assistant

## 2017-05-22 MED ORDER — TOPIRAMATE 25 MG PO TABS: 200.0000 mg | ORAL_TABLET | Freq: Every day | ORAL | 0 refills | Status: DC

## 2017-05-22 MED ORDER — ONDANSETRON 4 MG PO TBDP: 4.0000 mg | ORAL_TABLET | Freq: Three times a day (TID) | ORAL | 0 refills | Status: DC | PRN
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
## 2017-06-19 ENCOUNTER — Ambulatory Visit (INDEPENDENT_AMBULATORY_CARE_PROVIDER_SITE_OTHER): Payer: 59 | Admitting: Physician Assistant

## 2017-06-19 ENCOUNTER — Encounter: Payer: Self-pay | Admitting: Physician Assistant

## 2017-06-19 VITALS — BP 113/76 | HR 72 | Wt 165.8 lb

## 2017-06-19 DIAGNOSIS — M62838 Other muscle spasm: Secondary | ICD-10-CM | POA: Diagnosis not present

## 2017-06-19 DIAGNOSIS — G43009 Migraine without aura, not intractable, without status migrainosus: Secondary | ICD-10-CM

## 2017-06-19 MED ORDER — ONDANSETRON 4 MG PO TBDP: 4.0000 mg | ORAL_TABLET | Freq: Three times a day (TID) | ORAL | 0 refills | Status: DC | PRN

## 2017-06-19 MED ORDER — HYDROCODONE-ACETAMINOPHEN 10-300 MG PO TABS: 1.0000 | ORAL_TABLET | Freq: Four times a day (QID) | ORAL | 0 refills | Status: DC | PRN

## 2017-06-19 MED ORDER — ELETRIPTAN HYDROBROMIDE 40 MG PO TABS: 40.0000 mg | ORAL_TABLET | ORAL | 11 refills | Status: DC | PRN

## 2017-06-19 MED ORDER — SUMATRIPTAN SUCCINATE 6 MG/0.5ML ~~LOC~~ SOAJ: 1.0000 | SUBCUTANEOUS | 3 refills | Status: DC | PRN

## 2017-06-19 MED ORDER — TOPIRAMATE 100 MG PO TABS: 100.0000 mg | ORAL_TABLET | Freq: Two times a day (BID) | ORAL | 6 refills | Status: DC

## 2017-06-19 MED ORDER — CYCLOBENZAPRINE HCL 10 MG PO TABS: 10.0000 mg | ORAL_TABLET | Freq: Three times a day (TID) | ORAL | 1 refills | Status: DC | PRN

## 2017-06-19 MED ORDER — PROMETHAZINE HCL 25 MG PO TABS: 25.0000 mg | ORAL_TABLET | Freq: Three times a day (TID) | ORAL | 0 refills | Status: DC | PRN

## 2017-06-19 MED ORDER — NAPROXEN SODIUM 550 MG PO TABS: 550.0000 mg | ORAL_TABLET | ORAL | 1 refills | Status: DC | PRN

## 2017-06-19 NOTE — Patient Instructions (Signed)

## 2017-06-19 NOTE — Progress Notes (Signed)
History:  Candice [name] is a 31 y.o. G1P1001 who presents to clinic today for eval of migraine headaches.  She had Nexplanon removed this summer after 3 good years.  Started OCP.  Extreme headache with that change.  Imitrex injection plus phenergan made it better but did not relieve the HA for several days.  Relpax was needed more than in previous months.  This has been a good medication for her - better than oral imitrex or maxalt.   Flexeril more often at night.  helfpul Topamax -  at night  She is unable to tolerate more of this medication and is uncertain that she is getting benefit.   She continues to have significant stressors at home.     Past Medical History:  Diagnosis Date  . Anxiety   . Depression   . Migraines    a. x 10+ years.  . Postpartum care following cesarean delivery (5/29) 02/04/2014  . Pre-syncope    a. 07/2015 [facility] ED visit, ? vasovagal, improved with IVF.  [name] Short cervix in second trimester, antepartum 11/16/2013    Social History   Social History  . Marital status: Married    Spouse name: N/A  . Number of children: N/A  . Years of education: N/A   Occupational History  . Not on file.   Social History Main Topics  . Smoking status: Never Smoker  . Smokeless tobacco: Never Used  . Alcohol use 0.0 oz/week     Comment: maybe two drinks/month.  . Drug use: No  . Sexual activity: Yes    Birth control/ protection: Implant   Other Topics Concern  . Not on file   Social History Narrative   Lives in [place] with her husband and 47 month old dtr.  Does not routinely exercise.        Family History  Problem Relation Age of Onset  . Depression Mother   . Anxiety disorder Mother   . Heart attack Mother        NSTEMI @ age 69.  [name] Alcohol abuse Paternal Grandfather   . Sleep apnea Father        alive @ 69.  [name] Heart attack Maternal Grandfather   . Anxiety disorder Maternal Grandmother   . Depression Maternal Grandmother   . Diabetes  Maternal Grandmother   . Hypertension Maternal Grandmother     Allergies  Allergen Reactions  . Sulfa Antibiotics Other (See Comments)  . Sulfur Hives    Current Outpatient Prescriptions on File Prior to Visit  Medication Sig Dispense Refill  . ALPRAZolam (XANAX) 0.25 MG tablet Take 1 tablet (0.25 mg total) by mouth daily as needed for anxiety. 30 tablet 0  . cyclobenzaprine (FLEXERIL) 10 MG tablet Take 1 tablet (10 mg total) by mouth every 8 (eight) hours as needed for muscle spasms. 30 tablet 1  . eletriptan (RELPAX) 40 MG tablet Take 1 tablet (40 mg total) by mouth as needed for migraine or headache. May repeat in 2 hours if headache persists or recurs. 10 tablet 2  . escitalopram (LEXAPRO) 20 MG tablet Take 1 tablet (20 mg total) by mouth daily. 30 tablet 2  . Hydrocodone-Acetaminophen 10-300 MG TABS Take 1-2 tablets by mouth every 6 (six) hours as needed. 30 each 0  . naproxen sodium (ANAPROX DS) 550 MG tablet Take 1 tablet (550 mg total) by mouth as needed for headache. 60 tablet 1  . norethindrone (MICRONOR,CAMILA,ERRIN) 0.35 MG tablet   0  .  ondansetron (ZOFRAN-ODT) 4 MG disintegrating tablet Take 1 tablet (4 mg total) by mouth every 8 (eight) hours as needed for nausea or vomiting. 20 tablet 0  . topiramate (TOPAMAX) 25 MG tablet Take 8 tablets (200 mg total) by mouth daily. Increase dose weekly up to  240 tablet 0  . promethazine (PHENERGAN) 25 MG suppository Place 1 suppository (25 mg total) rectally every 6 (six) hours as needed for nausea or vomiting. (Patient not taking: Reported on 06/19/2017) 12 each 0  . promethazine (PHENERGAN) 25 MG tablet Take 1 tablet (25 mg total) by mouth every 8 (eight) hours as needed for nausea or vomiting. (Patient not taking: Reported on 06/19/2017) 30 tablet 0  . SUMAtriptan (IMITREX) 100 MG tablet Take 1 tablet (100 mg total) by mouth as needed. (Patient not taking: Reported on 06/19/2017) 12 tablet 11  . SUMAtriptan 6 MG/0.5ML SOAJ Inject 1  cartridge into the skin as needed (migraine). May repeat in 2 hours.  No more than 2 doses of Imitrex per day. 12 Cartridge 3   No current facility-administered medications on file prior to visit.      Review of Systems:  All pertinent positive/negative included in HPI, all other review of systems are negative   Objective:  Physical Exam BP 113/76   Pulse 72   Wt 165 lb 12.8 oz (75.2 kg)   BMI 28.46 kg/m  CONSTITUTIONAL: Well-developed, well-nourished female in no acute distress.  EYES: EOM intact ENT: Normocephalic CARDIOVASCULAR: Regular rate  RESPIRATORY: Normal rate. MUSCULOSKELETAL: Normal ROM SKIN: Warm, dry without erythema  NEUROLOGICAL: Alert, oriented, CN II-XII grossly intact, Appropriate balance PSYCH: Normal behavior, mood   Assessment & Plan:  Assessment: 1. Migraine without aura and without status migrainosus, not intractable   2. Muscle spasm     Plan: Pt will be referred for a study with [facility] for a trial of CGRP blocker for Migraine prevention.   If this does not work out, I will begin prescription CGRP blocker.   Pt will need to follow up withint 2-3 months with new medication unless she is followed closely by [facility].  Continue other medications as prescribed without changes Pt continuing contraception with POP for now.  Reminded she should not become pregnant while on topamax.   Follow-up in 3-6 months or sooner PRN  Bertram Denver, PA-C 06/19/2017 10:42 AM

## 2017-07-03 ENCOUNTER — Encounter: Payer: 59 | Admitting: Physician Assistant

## 2017-07-22 ENCOUNTER — Other Ambulatory Visit: Payer: Self-pay | Admitting: Physician Assistant

## 2017-09-07 ENCOUNTER — Telehealth: Payer: Self-pay

## 2017-09-07 ENCOUNTER — Other Ambulatory Visit: Payer: Self-pay | Admitting: Psychiatry

## 2017-09-07 DIAGNOSIS — F330 Major depressive disorder, recurrent, mild: Secondary | ICD-10-CM

## 2017-09-07 MED ORDER — ALPRAZOLAM 0.25 MG PO TABS: 0.2500 mg | ORAL_TABLET | Freq: Every day | ORAL | 0 refills | Status: DC | PRN

## 2017-09-07 MED ORDER — ESCITALOPRAM OXALATE 20 MG PO TABS: 20.0000 mg | ORAL_TABLET | Freq: Every day | ORAL | 2 refills | Status: DC

## 2017-09-07 NOTE — Telephone Encounter (Signed)
Lexapro and Xanax refills sent to the pharmacy.  Reviewed [place] controlled substance database

## 2017-09-07 NOTE — Telephone Encounter (Signed)
pt called states that she will not have enough medications to due until her next appt . pt needs xanax, lexapro,

## 2017-09-07 NOTE — Telephone Encounter (Signed)
Script sent to her pharmacy

## 2017-09-17 ENCOUNTER — Telehealth: Payer: Self-pay | Admitting: Radiology

## 2017-09-17 NOTE — Telephone Encounter (Signed)
Left message on the cell phone voicemail to call [facility] to schedule appointment with headache specialist for the month of March. Phone number provided.

## 2017-09-18 ENCOUNTER — Other Ambulatory Visit: Payer: Self-pay

## 2017-09-18 ENCOUNTER — Ambulatory Visit: Payer: 59 | Admitting: Psychiatry

## 2017-09-18 ENCOUNTER — Encounter: Payer: Self-pay | Admitting: Psychiatry

## 2017-09-18 VITALS — BP 133/82 | HR 80 | Temp 99.0°F | Wt 159.0 lb

## 2017-09-18 DIAGNOSIS — F331 Major depressive disorder, recurrent, moderate: Secondary | ICD-10-CM

## 2017-09-18 NOTE — Progress Notes (Signed)
Patient ID: Candice [name], female   DOB: [birth year], 32 y.o.   MRN: [id number]   [facility] MD/PA/NP OP Progress Note  09/18/2017 11:52 AM Candice [name]  MRN:  [id number]  Subjective:  Patient returns for follow-up of her major depressive disorder. Reports lot has happened since her last visit. She and her husband had separated back in 07-31-2023 and are trying to work it out. She lost a cousin back in 2023-07-31 and her grandmother died this 2023/02/02. States she is trying to deal with all this. She had tried to wean the lexapro to 10mg , but went back up to the 20mg  after all her losses. States that relationship with the husband seems to have improved but given all the recent stresses there is still trying to work it out. Reports fair sleep and appetite.  Chief Complaint: lot of family stress Chief Complaint    Follow-up; Medication Refill     Visit Diagnosis:     ICD-10-CM   1. Major depressive disorder, recurrent episode, moderate (HCC) F33.1     Past Medical History:  Past Medical History:  Diagnosis Date  . Anxiety   . Depression   . Migraines    a. x 10+ years.  . Postpartum care following cesarean delivery (5/29) 02/04/2014  . Pre-syncope    a. 31-Jul-2015 [facility] ED visit, ? vasovagal, improved with IVF.  [name] Short cervix in second trimester, antepartum 11/16/2013    Past Surgical History:  Procedure Laterality Date  . CESAREAN SECTION N/A 02/03/2014   Procedure: Primary Cesarean Section Delivery Baby Girl @ 2244, Apgars 9/9;  Surgeon: Serita Kyle, MD;  Location: [facility];  Service: Obstetrics;  Laterality: N/A;  . CESAREAN SECTION    . WISDOM TOOTH EXTRACTION  2002   Family History:  Family History  Problem Relation Age of Onset  . Depression Mother   . Anxiety disorder Mother   . Heart attack Mother        NSTEMI @ age 85.  [name] Alcohol abuse Paternal Grandfather   . Sleep apnea Father        alive @ 69.  [name] Heart attack Maternal Grandfather   . Anxiety disorder Maternal  Grandmother   . Depression Maternal Grandmother   . Diabetes Maternal Grandmother   . Hypertension Maternal Grandmother    Social History:  Social History   Socioeconomic History  . Marital status: Married    Spouse name: None  . Number of children: None  . Years of education: None  . Highest education level: None  Social Needs  . Financial resource strain: None  . Food insecurity - worry: None  . Food insecurity - inability: None  . Transportation needs - medical: None  . Transportation needs - non-medical: None  Occupational History  . None  Tobacco Use  . Smoking status: Never Smoker  . Smokeless tobacco: Never Used  Substance and Sexual Activity  . Alcohol use: Yes    Alcohol/week: 0.0 oz    Comment: maybe two drinks/month.  . Drug use: No  . Sexual activity: Yes    Birth control/protection: Implant  Other Topics Concern  . None  Social History Narrative   Lives in [place] with her husband and 36 month old dtr.  Does not routinely exercise.       Additional History:   Assessment:   Musculoskeletal: Strength & Muscle Tone: within normal limits Gait & Station: normal Patient leans: N/A  Psychiatric Specialty Exam: Medication Refill  Anxiety  Patient reports no insomnia, nervous/anxious behavior or suicidal ideas.      Review of Systems  Psychiatric/Behavioral: Negative for depression, hallucinations, memory loss, substance abuse and suicidal ideas. The patient is not nervous/anxious and does not have insomnia.        Weight gain over past several months  All other systems reviewed and are negative.   Blood pressure 133/82, pulse 80, temperature 99 F (37.2 C), temperature source Oral, weight 72.1 kg (159 lb), currently breastfeeding.Body mass index is 27.29 kg/m.  General Appearance: Neat and Well Groomed  Eye Contact:  Good  Speech:  Clear and Coherent and Normal Rate  Volume:  Normal  Mood:  ok  Affect:  Bright smile  Thought Process:  Linear  and Logical  Orientation:  Full (Time, Place, and Person)  Thought Content:  Negative  Suicidal Thoughts:  No  Homicidal Thoughts:  No  Memory:  Immediate;   Good Recent;   Good Remote;   Good  Judgement:  Good  Insight:  Good  Psychomotor Activity:  Negative  Concentration:  Good  Recall:  Good  Fund of Knowledge: Good  Language: Good  Akathisia:  Negative  Handed:  Right unknown  AIMS (if indicated):  Not done  Assets:  Communication Skills Desire for Improvement Social Support Vocational/Educational  ADL's:  Intact  Cognition: WNL  Sleep:  Okay    Is the patient at risk to self?  No. Has the patient been a risk to self in the past 6 months?  No. Has the patient been a risk to self within the distant past?  No. Is the patient a risk to others?  No. Has the patient been a risk to others in the past 6 months?  No. Has the patient been a risk to others within the distant past?  No.  Current Medications: Current Outpatient Medications  Medication Sig Dispense Refill  . ALPRAZolam (XANAX) 0.25 MG tablet Take 1 tablet (0.25 mg total) by mouth daily as needed for anxiety. 30 tablet 0  . cyclobenzaprine (FLEXERIL) 10 MG tablet Take 1 tablet (10 mg total) by mouth every 8 (eight) hours as needed for muscle spasms. 30 tablet 1  . eletriptan (RELPAX) 40 MG tablet Take 1 tablet (40 mg total) by mouth as needed for migraine or headache. May repeat in 2 hours if headache persists or recurs. 10 tablet 11  . escitalopram (LEXAPRO) 20 MG tablet Take 1 tablet (20 mg total) by mouth daily. 30 tablet 2  . Hydrocodone-Acetaminophen 10-300 MG TABS Take 1-2 tablets by mouth every 6 (six) hours as needed. 30 each 0  . naproxen sodium (ANAPROX DS) 550 MG tablet Take 1 tablet (550 mg total) by mouth as needed for headache. 60 tablet 1  . norethindrone (MICRONOR,CAMILA,ERRIN) 0.35 MG tablet   0  . ondansetron (ZOFRAN-ODT) 4 MG disintegrating tablet Take 1 tablet (4 mg total) by mouth every 8  (eight) hours as needed for nausea or vomiting. 20 tablet 0  . ondansetron (ZOFRAN-ODT) 4 MG disintegrating tablet DISSOLVE 1 TABLET BY MOUTH EVERY 8 (EIGHT) HOURS AS NEEDED FOR NAUSEA OR VOMITING. 20 tablet 1  . promethazine (PHENERGAN) 25 MG suppository Place 1 suppository (25 mg total) rectally every 6 (six) hours as needed for nausea or vomiting. 12 each 0  . promethazine (PHENERGAN) 25 MG tablet Take 1 tablet (25 mg total) by mouth every 8 (eight) hours as needed for nausea or vomiting. 30 tablet 0  . SUMAtriptan (IMITREX) 100  MG tablet Take 1 tablet (100 mg total) by mouth as needed. 12 tablet 11  . SUMAtriptan 6 MG/0.5ML SOAJ Inject 1 cartridge into the skin as needed (migraine). May repeat in 2 hours.  No more than 2 doses of Imitrex per day. 12 Cartridge 3  . topiramate (TOPAMAX) 100 MG tablet Take 1 tablet (100 mg total) by mouth 2 (two) times daily. 60 tablet 6  . topiramate (TOPAMAX) 25 MG tablet Take 8 tablets (200 mg total) by mouth daily. Increase dose weekly up to 200mg  240 tablet 0   No current facility-administered medications for this visit.     Medical Decision Making:  Established Problem, Stable/Improving (1) patient indicates some improvement on medication but wonders whether she could feel even better with a higher dose. she is denying any side effects on the Lexapro.  Treatment Plan Summary:Medication management   Major depressive disorder, recurrent, moderate In remission Continue Lexapro 20 mg and take it at bedtime Continue therapy with Nolon RodNicole Peacock.  Other anxiety disorder- alprazolam 0.25 mg daily as needed for anxiety/panic.  Patient will follow up in 2 month or call before if needed  .Shaney Deckman 09/18/2017, 11:52 AM

## 2017-09-22 ENCOUNTER — Telehealth: Payer: Self-pay | Admitting: Radiology

## 2017-09-22 DIAGNOSIS — H43393 Other vitreous opacities, bilateral: Secondary | ICD-10-CM | POA: Diagnosis not present

## 2017-09-22 DIAGNOSIS — H40013 Open angle with borderline findings, low risk, bilateral: Secondary | ICD-10-CM | POA: Diagnosis not present

## 2017-09-22 DIAGNOSIS — H31102 Choroidal degeneration, unspecified, left eye: Secondary | ICD-10-CM | POA: Diagnosis not present

## 2017-09-22 DIAGNOSIS — H5213 Myopia, bilateral: Secondary | ICD-10-CM | POA: Diagnosis not present

## 2017-09-22 NOTE — Telephone Encounter (Signed)
Left message on cell phone voicemail to call [facility], Nada MaclachlanKaren Teague-Clark had a cancellation and was call ing patient to see if she wanted to fill it.

## 2017-10-05 ENCOUNTER — Ambulatory Visit (INDEPENDENT_AMBULATORY_CARE_PROVIDER_SITE_OTHER): Payer: Self-pay | Admitting: Psychiatry

## 2017-10-05 ENCOUNTER — Encounter: Payer: Self-pay | Admitting: Psychiatry

## 2017-10-05 VITALS — BP 125/73 | Temp 98.1°F | Wt 155.0 lb

## 2017-10-05 DIAGNOSIS — J069 Acute upper respiratory infection, unspecified: Secondary | ICD-10-CM

## 2017-10-05 DIAGNOSIS — B9689 Other specified bacterial agents as the cause of diseases classified elsewhere: Secondary | ICD-10-CM

## 2017-10-05 MED ORDER — AMOXICILLIN-POT CLAVULANATE 875-125 MG PO TABS: 1.0000 | ORAL_TABLET | Freq: Two times a day (BID) | ORAL | 0 refills | Status: DC

## 2017-10-05 MED ORDER — BENZONATATE 100 MG PO CAPS: 100.0000 mg | ORAL_CAPSULE | Freq: Three times a day (TID) | ORAL | 0 refills | Status: DC | PRN

## 2017-10-05 MED ORDER — FLUTICASONE PROPIONATE 50 MCG/ACT NA SUSP: 2.0000 | Freq: Every day | NASAL | 0 refills | Status: DC

## 2017-10-05 NOTE — Progress Notes (Signed)
Chest congestion, sob started since Tuesday a week ago, brownish phlegm. No fever, have had chills on Friday night, no recent travell, was around 828 year old daughter who recently had upper respiratory. , maxilla Subjective:     Chapman FitchStephanie T Cawthorn is a 32 y.o. female who presents for evaluation of symptoms of a URI. Symptoms include cough described as productive of brown sputum. Onset of symptoms was 7 days ago, and has been unchanged since that time. Associated symptoms include SOB, chest congestion, chills, sinus tenderness, cough. Pertinent negatives include denies any wheezing, no sore throat or running nose. Treatment to date: Have tried Dayqill, Nyquill and Mucinex dm without reilef.   The following portions of the patient's history were reviewed and updated as appropriate: allergies, current medications, past family history, past medical history, past social history, past surgical history and problem list.  Review of Systems Pertinent items are noted in HPI.   Objective:    General appearance: alert and no distress Head: Normocephalic, without obvious abnormality, atraumatic Eyes: conjunctivae/corneas clear. PERRL, EOM's intact. Fundi benign. Ears: normal TM's and external ear canals both ears Nose: right turbinate red, inflamed, sinus tenderness bilateral Throat: lips, mucosa, and tongue normal; teeth and gums normal Neck: no adenopathy, no carotid bruit, no JVD, supple, symmetrical, trachea midline and thyroid not enlarged, symmetric, no tenderness/mass/nodules Lungs: clear to auscultation bilaterally Heart: regular rate and rhythm, S1, S2 normal, no murmur, click, rub or gallop   Assessment:     Diagnoses and all orders for this visit:  Bacterial upper respiratory infection -     benzonatate (TESSALON) 100 MG capsule; Take 1 capsule (100 mg total) by mouth 3 (three) times daily as needed for cough. -     amoxicillin-clavulanate (AUGMENTIN) 875-125 MG tablet; Take 1 tablet by mouth  2 (two) times daily. -     fluticasone (FLONASE) 50 MCG/ACT nasal spray; Place 2 sprays into both nostrils daily.   Plan:    Discussed diagnosis and treatment of URI. Suggested symptomatic OTC remedies. Nasal saline spray for congestion. Follow up as needed. .[name]

## 2017-10-05 NOTE — Patient Instructions (Signed)
Upper Respiratory Infection, Adult Most upper respiratory infections (URIs) are caused by a virus. A URI affects the nose, throat, and upper air passages. The most common type of URI is often called "the common cold." Follow these instructions at home:  Take medicines only as told by your doctor.  Gargle warm saltwater or take cough drops to comfort your throat as told by your doctor.  Use a warm mist humidifier or inhale steam from a shower to increase air moisture. This may make it easier to breathe.  Drink enough fluid to keep your pee (urine) clear or pale yellow.  Eat soups and other clear broths.  Have a healthy diet.  Rest as needed.  Go back to work when your fever is gone or your doctor says it is okay. ? You may need to stay home longer to avoid giving your URI to others. ? You can also wear a face mask and wash your hands often to prevent spread of the virus.  Use your inhaler more if you have asthma.  Do not use any tobacco products, including cigarettes, chewing tobacco, or electronic cigarettes. If you need help quitting, ask your doctor. Contact a doctor if:  You are getting worse, not better.  Your symptoms are not helped by medicine.  You have chills.  You are getting more short of breath.  You have brown or red mucus.  You have yellow or brown discharge from your nose.  You have pain in your face, especially when you bend forward.  You have a fever.  You have puffy (swollen) neck glands.  You have pain while swallowing.  You have white areas in the back of your throat. Get help right away if:  You have very bad or constant: ? Headache. ? Ear pain. ? Pain in your forehead, behind your eyes, and over your cheekbones (sinus pain). ? Chest pain.  You have long-lasting (chronic) lung disease and any of the following: ? Wheezing. ? Long-lasting cough. ? Coughing up blood. ? A change in your usual mucus.  You have a stiff neck.  You have  changes in your: ? Vision. ? Hearing. ? Thinking. ? Mood. This information is not intended to replace advice given to you by your health care provider. Make sure you discuss any questions you have with your health care provider. Document Released: 02/11/2008 Document Revised: 04/27/2016 Document Reviewed: 11/30/2013 Elsevier Interactive Patient Education  2018 Elsevier Inc.  

## 2017-10-09 ENCOUNTER — Encounter: Payer: Self-pay | Admitting: Physician Assistant

## 2017-10-09 ENCOUNTER — Ambulatory Visit: Payer: 59 | Admitting: Physician Assistant

## 2017-10-09 VITALS — BP 134/88 | HR 110

## 2017-10-09 DIAGNOSIS — M62838 Other muscle spasm: Secondary | ICD-10-CM

## 2017-10-09 DIAGNOSIS — F439 Reaction to severe stress, unspecified: Secondary | ICD-10-CM

## 2017-10-09 DIAGNOSIS — G43009 Migraine without aura, not intractable, without status migrainosus: Secondary | ICD-10-CM | POA: Diagnosis not present

## 2017-10-09 MED ORDER — ONDANSETRON 4 MG PO TBDP: ORAL_TABLET | ORAL | 1 refills | Status: DC

## 2017-10-09 MED ORDER — HYDROCODONE-ACETAMINOPHEN 10-300 MG PO TABS: 1.0000 | ORAL_TABLET | Freq: Four times a day (QID) | ORAL | 0 refills | Status: DC | PRN

## 2017-10-09 MED ORDER — CYCLOBENZAPRINE HCL 10 MG PO TABS: 10.0000 mg | ORAL_TABLET | Freq: Three times a day (TID) | ORAL | 1 refills | Status: DC | PRN

## 2017-10-09 MED ORDER — GALCANEZUMAB-GNLM 120 MG/ML ~~LOC~~ SOAJ: 1.0000 "pen " | SUBCUTANEOUS | 11 refills | Status: DC

## 2017-10-09 NOTE — Patient Instructions (Signed)

## 2017-10-09 NOTE — Progress Notes (Signed)
History:  [name] is a 32 y.o. G1P1001 who presents to clinic today for followup of migraine headaches.  She has been having more headaches overall.  Her home life has been very stressful with her marital situation as well as 2 unexpected deaths in her family.  She was planning to participate in a pharmaceutical study but this did not happen.  She notes Imitrex injectable works well but has become very expensive.  No change in quality of HA.  She has tried multiple preventives and is very interested in CGRP blocker.   She discontinued her topamax in anticipation of the drug study - but did not restart when the study did not enroll her.    Past Medical History:  Diagnosis Date  . Anxiety   . Depression   . Migraines    a. x 10+ years.  . Postpartum care following cesarean delivery (5/29) 02/04/2014  . Pre-syncope    a. 07/2015 [facility] ED visit, ? vasovagal, improved with IVF.  [name]. Short cervix in second trimester, antepartum 11/16/2013    Social History   Socioeconomic History  . Marital status: Married    Spouse name: Not on file  . Number of children: Not on file  . Years of education: Not on file  . Highest education level: Not on file  Social Needs  . Financial resource strain: Not on file  . Food insecurity - worry: Not on file  . Food insecurity - inability: Not on file  . Transportation needs - medical: Not on file  . Transportation needs - non-medical: Not on file  Occupational History  . Not on file  Tobacco Use  . Smoking status: Never Smoker  . Smokeless tobacco: Never Used  Substance and Sexual Activity  . Alcohol use: Yes    Alcohol/week: 0.0 oz    Comment: maybe two drinks/month.  . Drug use: No  . Sexual activity: Yes    Birth control/protection: Implant  Other Topics Concern  . Not on file  Social History Narrative   Lives in [place] with her husband and 6617 month old dtr.  Does not routinely exercise.        Family History  Problem Relation Age  of Onset  . Depression Mother   . Anxiety disorder Mother   . Heart attack Mother        NSTEMI @ age 34.  [name]. Alcohol abuse Paternal Grandfather   . Sleep apnea Father        alive @ [id number].  [name]. Heart attack Maternal Grandfather   . Anxiety disorder Maternal Grandmother   . Depression Maternal Grandmother   . Diabetes Maternal Grandmother   . Hypertension Maternal Grandmother     Allergies  Allergen Reactions  . Sulfa Antibiotics Other (See Comments)  . Sulfur Hives    Current Outpatient Medications on File Prior to Visit  Medication Sig Dispense Refill  . ALPRAZolam (XANAX) 0.25 MG tablet Take 1 tablet (0.25 mg total) by mouth daily as needed for anxiety. 30 tablet 0  . amoxicillin-clavulanate (AUGMENTIN) 875-125 MG tablet Take 1 tablet by mouth 2 (two) times daily. 20 tablet 0  . eletriptan (RELPAX) 40 MG tablet Take 1 tablet (40 mg total) by mouth as needed for migraine or headache. May repeat in 2 hours if headache persists or recurs. 10 tablet 11  . escitalopram (LEXAPRO) 20 MG tablet Take 1 tablet (20 mg total) by mouth daily. 30 tablet 2  . fluticasone (FLONASE) 50 MCG/ACT  nasal spray Place 2 sprays into both nostrils daily. 16 g 0  . naproxen sodium (ANAPROX DS) 550 MG tablet Take 1 tablet (550 mg total) by mouth as needed for headache. 60 tablet 1  . norethindrone (MICRONOR,CAMILA,ERRIN) 0.35 MG tablet   0  . SUMAtriptan 6 MG/0.5ML SOAJ Inject 1 cartridge into the skin as needed (migraine). May repeat in 2 hours.  No more than 2 doses of Imitrex per day. 12 Cartridge 3  . benzonatate (TESSALON) 100 MG capsule Take 1 capsule (100 mg total) by mouth 3 (three) times daily as needed for cough. (Patient not taking: Reported on 10/09/2017) 20 capsule 0  . ondansetron (ZOFRAN-ODT) 4 MG disintegrating tablet Take 1 tablet (4 mg total) by mouth every 8 (eight) hours as needed for nausea or vomiting. (Patient not taking: Reported on 10/09/2017) 20 tablet 0  . promethazine (PHENERGAN) 25 MG  suppository Place 1 suppository (25 mg total) rectally every 6 (six) hours as needed for nausea or vomiting. (Patient not taking: Reported on 10/09/2017) 12 each 0  . promethazine (PHENERGAN) 25 MG tablet Take 1 tablet (25 mg total) by mouth every 8 (eight) hours as needed for nausea or vomiting. (Patient not taking: Reported on 10/09/2017) 30 tablet 0  . SUMAtriptan (IMITREX) 100 MG tablet Take 1 tablet (100 mg total) by mouth as needed. (Patient not taking: Reported on 10/09/2017) 12 tablet 11  . topiramate (TOPAMAX) 100 MG tablet Take 1 tablet (100 mg total) by mouth 2 (two) times daily. (Patient not taking: Reported on 10/09/2017) 60 tablet 6  . topiramate (TOPAMAX) 25 MG tablet Take 8 tablets (200 mg total) by mouth daily. Increase dose weekly up to 200mg  (Patient not taking: Reported on 10/09/2017) 240 tablet 0   No current facility-administered medications on file prior to visit.      Review of Systems:  All pertinent positive/negative included in HPI, all other review of systems are negative   Objective:  Physical Exam BP 134/88   Pulse (!) 110  CONSTITUTIONAL: Well-developed, well-nourished female in no acute distress.  EYES: EOM intact ENT: Normocephalic CARDIOVASCULAR: Regular rate  RESPIRATORY: Normal rate.  MUSCULOSKELETAL: Normal ROM, strength equal bilaterally SKIN: Warm, dry without erythema  NEUROLOGICAL: Alert, oriented, CN II-XII grossly intact, Appropriate balance PSYCH: Normal behavior, mood   Assessment & Plan:  Assessment: 1. Migraine without aura and without status migrainosus, not intractable   2. Muscle spasm   3. Stress at home      Plan: Will begin Emgality for migraine prevention as pt continues to need help with this.  Extensive discussion with pt regarding this medication, how to use and what to expect.  Savings card provided.   Will continue acute therapies.   No other changes at this time.   Follow-up in 3 months or sooner PRN  Bertram Denver,  PA-C 10/09/2017 1:06 PM

## 2017-10-12 ENCOUNTER — Telehealth: Payer: 59 | Admitting: Family

## 2017-10-12 DIAGNOSIS — B373 Candidiasis of vulva and vagina: Secondary | ICD-10-CM

## 2017-10-12 DIAGNOSIS — B3731 Acute candidiasis of vulva and vagina: Secondary | ICD-10-CM

## 2017-10-12 MED ORDER — FLUCONAZOLE 150 MG PO TABS: 150.0000 mg | ORAL_TABLET | Freq: Once | ORAL | 0 refills | Status: AC

## 2017-10-12 NOTE — Progress Notes (Signed)

## 2017-10-27 ENCOUNTER — Ambulatory Visit (INDEPENDENT_AMBULATORY_CARE_PROVIDER_SITE_OTHER): Payer: 59 | Admitting: Podiatry

## 2017-10-27 ENCOUNTER — Encounter: Payer: Self-pay | Admitting: Podiatry

## 2017-10-27 DIAGNOSIS — B070 Plantar wart: Secondary | ICD-10-CM

## 2017-10-29 NOTE — Progress Notes (Signed)
   Subjective: 32 year old female presents today as a new patient with a chief complaint of a painful lesion to the plantar aspect of the right heel that has been present for the past 6-7 months. She is concerned about a plantar wart. She recently hit the foot on the bed which aggravated the area. She has not done anything to treat the symptoms. Patient is here for further evaluation and treatment.   Past Medical History:  Diagnosis Date  . Anxiety   . Depression   . Migraines    a. x 10+ years.  . Postpartum care following cesarean delivery (5/29) 02/04/2014  . Pre-syncope    a. 07/2015 [facility] ED visit, ? vasovagal, improved with IVF.  [name]. Short cervix in second trimester, antepartum 11/16/2013    Objective: Physical Exam General: The patient is alert and oriented x3 in no acute distress.  Dermatology: Hyperkeratotic skin lesion noted to the plantar aspect of the right foot approximately 1 cm in diameter. Pinpoint bleeding noted upon debridement. Skin is warm, dry and supple bilateral lower extremities. Negative for open lesions or macerations.  Vascular: Palpable pedal pulses bilaterally. No edema or erythema noted. Capillary refill within normal limits.  Neurological: Epicritic and protective threshold grossly intact bilaterally.   Musculoskeletal Exam: Pain on palpation to the note skin lesion.  Range of motion within normal limits to all pedal and ankle joints bilateral. Muscle strength 5/5 in all groups bilateral.   Assessment: #1 plantar wart right heel  #2 pain in right foot   Plan of Care:  #1 Patient was evaluated. #2 Excisional debridement of the plantar wart lesion was performed using a chisel blade. Cantharone was applied and the lesion was dressed with a dry sterile dressing. #3 patient is to return to clinic in 2 weeks.   Felecia ShellingBrent M. Evans, DPM [facility]  Dr. Felecia ShellingBrent M. Evans, DPM    [address]                                         [place], [place] [address]                Office [phone]  Fax [phone]) [phone]

## 2017-11-10 ENCOUNTER — Encounter: Payer: Self-pay | Admitting: Podiatry

## 2017-11-10 ENCOUNTER — Ambulatory Visit (INDEPENDENT_AMBULATORY_CARE_PROVIDER_SITE_OTHER): Payer: 59 | Admitting: Podiatry

## 2017-11-10 DIAGNOSIS — B070 Plantar wart: Secondary | ICD-10-CM

## 2017-11-12 ENCOUNTER — Ambulatory Visit: Payer: 59 | Admitting: Psychiatry

## 2017-11-12 NOTE — Progress Notes (Signed)
   Subjective: 32 year old female presents today for follow up evaluation of a plantar wart to the right heel. She reports some improvement of the symptoms. She denies any new complaints. Patient is here for further evaluation and treatment.   Past Medical History:  Diagnosis Date  . Anxiety   . Depression   . Migraines    a. x 10+ years.  . Postpartum care following cesarean delivery (5/29) 02/04/2014  . Pre-syncope    a. 07/2015 [facility], ? vasovagal, improved with IVF.  [name]. Short cervix in second trimester, antepartum 11/16/2013    Objective: Physical Exam General: The patient is alert and oriented x3 in no acute distress.  Dermatology: Hyperkeratotic skin lesion noted to the plantar aspect of the right foot approximately 1 cm in diameter. Pinpoint bleeding noted upon debridement. Skin is warm, dry and supple bilateral lower extremities. Negative for open lesions or macerations.  Vascular: Palpable pedal pulses bilaterally. No edema or erythema noted. Capillary refill within normal limits.  Neurological: Epicritic and protective threshold grossly intact bilaterally.   Musculoskeletal Exam: Pain on palpation to the note skin lesion.  Range of motion within normal limits to all pedal and ankle joints bilateral. Muscle strength 5/5 in all groups bilateral.   Assessment: #1 plantar wart right heel  #2 pain in right foot   Plan of Care:  #1 Patient was evaluated. #2 Excisional debridement of the plantar wart lesion was performed using a chisel blade. Salinocaine was applied and the lesion was dressed with a dry sterile dressing. #3 Prescription for wart cream to be dispensed by [facility].  #4 Return to clinic as needed.   Felecia ShellingBrent M. Evans, DPM [facility]  Dr. Felecia ShellingBrent M. Evans, DPM    [address]                                        [place], [place] [address]                Office [phone]) [phone]  Fax [phone]) [phone]

## 2017-11-19 ENCOUNTER — Ambulatory Visit: Payer: 59 | Admitting: Psychiatry

## 2017-11-19 ENCOUNTER — Encounter: Payer: Self-pay | Admitting: Psychiatry

## 2017-11-19 ENCOUNTER — Other Ambulatory Visit: Payer: Self-pay

## 2017-11-19 VITALS — BP 129/85 | HR 91 | Temp 98.7°F | Wt 163.8 lb

## 2017-11-19 DIAGNOSIS — F331 Major depressive disorder, recurrent, moderate: Secondary | ICD-10-CM

## 2017-11-19 DIAGNOSIS — F330 Major depressive disorder, recurrent, mild: Secondary | ICD-10-CM | POA: Diagnosis not present

## 2017-11-19 MED ORDER — ESCITALOPRAM OXALATE 20 MG PO TABS: 20.0000 mg | ORAL_TABLET | Freq: Every day | ORAL | 2 refills | Status: DC

## 2017-11-19 NOTE — Progress Notes (Signed)
Patient ID: Candice [name], female   DOB: 01/16/1986, 32 y.o.   MRN: [id number]   [facility] MD/PA/NP OP Progress Note  11/19/2017 1:55 PM Candice [name]  MRN:  [id number]  Subjective:  Patient returns for follow-up of her major depressive disorder. She reports that she has been doing okay. States that his job has been busy and somewhat with a lot of changes. She has been taking  Lexapro at 20 mg.Denies any mood or anxiety symptoms.Coping okay with her home life. Denies any suicidal thoughts.  Chief Complaint: work stress  Visit Diagnosis:     ICD-10-CM   1. Major depressive disorder, recurrent episode, moderate (HCC) F33.1     Past Medical History:  Past Medical History:  Diagnosis Date  . Anxiety   . Depression   . Migraines    a. x 10+ years.  . Postpartum care following cesarean delivery (5/29) 02/04/2014  . Pre-syncope    a. 07/2015 [facility] ED visit, ? vasovagal, improved with IVF.  [name] Short cervix in second trimester, antepartum 11/16/2013    Past Surgical History:  Procedure Laterality Date  . CESAREAN SECTION N/A 02/03/2014   Procedure: Primary Cesarean Section Delivery Baby Girl @ 2244, Apgars 9/9;  Surgeon: Serita Kyle, MD;  Location: [facility];  Service: Obstetrics;  Laterality: N/A;  . CESAREAN SECTION    . WISDOM TOOTH EXTRACTION  2002   Family History:  Family History  Problem Relation Age of Onset  . Depression Mother   . Anxiety disorder Mother   . Heart attack Mother        NSTEMI @ age 53.  [name] Alcohol abuse Paternal Grandfather   . Sleep apnea Father        alive @ 59.  [name] Heart attack Maternal Grandfather   . Anxiety disorder Maternal Grandmother   . Depression Maternal Grandmother   . Diabetes Maternal Grandmother   . Hypertension Maternal Grandmother    Social History:  Social History   Socioeconomic History  . Marital status: Married    Spouse name: Not on file  . Number of children: Not on file  . Years of education: Not on file  . Highest  education level: Not on file  Social Needs  . Financial resource strain: Not on file  . Food insecurity - worry: Not on file  . Food insecurity - inability: Not on file  . Transportation needs - medical: Not on file  . Transportation needs - non-medical: Not on file  Occupational History  . Not on file  Tobacco Use  . Smoking status: Never Smoker  . Smokeless tobacco: Never Used  Substance and Sexual Activity  . Alcohol use: Yes    Alcohol/week: 0.0 oz    Comment: maybe two drinks/month.  . Drug use: No  . Sexual activity: Yes    Birth control/protection: Implant  Other Topics Concern  . Not on file  Social History Narrative   Lives in [place] with her husband and 34 month old dtr.  Does not routinely exercise.       Additional History:   Assessment:   Musculoskeletal: Strength & Muscle Tone: within normal limits Gait & Station: normal Patient leans: N/A  Psychiatric Specialty Exam: Medication Refill   Anxiety  Patient reports no insomnia, nervous/anxious behavior or suicidal ideas.      Review of Systems  Psychiatric/Behavioral: Negative for depression, hallucinations, memory loss, substance abuse and suicidal ideas. The patient is not nervous/anxious and does not have  insomnia.        Weight gain over past several months  All other systems reviewed and are negative.   currently breastfeeding.There is no height or weight on file to calculate BMI.  General Appearance: Neat and Well Groomed  Eye Contact:  Good  Speech:  Clear and Coherent and Normal Rate  Volume:  Normal  Mood:  ok  Affect:  Bright smile  Thought Process:  Linear and Logical  Orientation:  Full (Time, Place, and Person)  Thought Content:  Negative  Suicidal Thoughts:  No  Homicidal Thoughts:  No  Memory:  Immediate;   Good Recent;   Good Remote;   Good  Judgement:  Good  Insight:  Good  Psychomotor Activity:  Negative  Concentration:  Good  Recall:  Good  Fund of Knowledge: Good   Language: Good  Akathisia:  Negative  Handed:  Right unknown  AIMS (if indicated):  Not done  Assets:  Communication Skills Desire for Improvement Social Support Vocational/Educational  ADL's:  Intact  Cognition: WNL  Sleep:  Okay    Is the patient at risk to self?  No. Has the patient been a risk to self in the past 6 months?  No. Has the patient been a risk to self within the distant past?  No. Is the patient a risk to others?  No. Has the patient been a risk to others in the past 6 months?  No. Has the patient been a risk to others within the distant past?  No.  Current Medications: Current Outpatient Medications  Medication Sig Dispense Refill  . ALPRAZolam (XANAX) 0.25 MG tablet Take 1 tablet (0.25 mg total) by mouth daily as needed for anxiety. 30 tablet 0  . cyclobenzaprine (FLEXERIL) 10 MG tablet Take 1 tablet (10 mg total) by mouth every 8 (eight) hours as needed for muscle spasms. 30 tablet 1  . eletriptan (RELPAX) 40 MG tablet Take 1 tablet (40 mg total) by mouth as needed for migraine or headache. May repeat in 2 hours if headache persists or recurs. 10 tablet 11  . escitalopram (LEXAPRO) 20 MG tablet Take 1 tablet (20 mg total) by mouth daily. 30 tablet 2  . fluticasone (FLONASE) 50 MCG/ACT nasal spray Place 2 sprays into both nostrils daily. 16 g 0  . Galcanezumab-gnlm (EMGALITY) 120 MG/ML SOAJ Inject 1 pen into the skin every 30 (thirty) days. 1 pen 11  . Hydrocodone-Acetaminophen 10-300 MG TABS Take 1-2 tablets by mouth every 6 (six) hours as needed. 20 each 0  . naproxen sodium (ANAPROX DS) 550 MG tablet Take 1 tablet (550 mg total) by mouth as needed for headache. (Patient not taking: Reported on 10/27/2017) 60 tablet 1  . norethindrone (MICRONOR,CAMILA,ERRIN) 0.35 MG tablet   0  . ondansetron (ZOFRAN) 4 MG tablet   1  . ondansetron (ZOFRAN-ODT) 4 MG disintegrating tablet Take 1 tablet (4 mg total) by mouth every 8 (eight) hours as needed for nausea or vomiting.  (Patient not taking: Reported on 10/09/2017) 20 tablet 0  . ondansetron (ZOFRAN-ODT) 4 MG disintegrating tablet DISSOLVE 1 TABLET BY MOUTH EVERY 8 (EIGHT) HOURS AS NEEDED FOR NAUSEA OR VOMITING. (Patient not taking: Reported on 10/27/2017) 20 tablet 1  . promethazine (PHENERGAN) 25 MG suppository Place 1 suppository (25 mg total) rectally every 6 (six) hours as needed for nausea or vomiting. (Patient not taking: Reported on 10/27/2017) 12 each 0  . promethazine (PHENERGAN) 25 MG tablet Take 1 tablet (25 mg total) by mouth  every 8 (eight) hours as needed for nausea or vomiting. (Patient not taking: Reported on 10/27/2017) 30 tablet 0  . SUMAtriptan (IMITREX) 100 MG tablet Take 1 tablet (100 mg total) by mouth as needed. (Patient not taking: Reported on 10/09/2017) 12 tablet 11  . SUMAtriptan 6 MG/0.5ML SOAJ Inject 1 cartridge into the skin as needed (migraine). May repeat in 2 hours.  No more than 2 doses of Imitrex per day. 12 Cartridge 3  . valACYclovir (VALTREX) 1000 MG tablet   6   No current facility-administered medications for this visit.     Medical Decision Making:  Established Problem, Stable/Improving (1) patient indicates some improvement on medication but wonders whether she could feel even better with a higher dose. she is denying any side effects on the Lexapro.  Treatment Plan Summary:Medication management   Major depressive disorder, recurrent, moderate In remission Continue Lexapro 20 mg and take it at bedtime Continue therapy with Candice [name].  Other anxiety disorder- alprazolam 0.25 mg daily as needed for anxiety/panic.  Patient will follow up in 2 month or call before if needed  .Candice [name] 11/19/2017, 1:55 PM

## 2017-11-20 ENCOUNTER — Encounter: Payer: 59 | Admitting: Physician Assistant

## 2017-12-12 ENCOUNTER — Telehealth: Payer: 59 | Admitting: Nurse Practitioner

## 2017-12-12 DIAGNOSIS — R05 Cough: Secondary | ICD-10-CM | POA: Diagnosis not present

## 2017-12-12 DIAGNOSIS — R059 Cough, unspecified: Secondary | ICD-10-CM

## 2017-12-12 MED ORDER — AZITHROMYCIN 250 MG PO TABS: ORAL_TABLET | ORAL | 0 refills | Status: DC

## 2017-12-12 NOTE — Progress Notes (Signed)
We are sorry that you are not feeling well.  Here is how we plan to help!  Based on your presentation I believe you most likely have A cough due to bacteria.  When patients have a fever and a productive cough with a change in color or increased sputum production, we are concerned about bacterial bronchitis.  If left untreated it can progress to pneumonia.  If your symptoms do not improve with your treatment plan it is important that you contact your provider.   I have prescribed Azithromyin 250 mg: two tablets now and then one tablet daily for 4 additonal days    In addition you may use A non-prescription cough medication called Mucinex DM: take 2 tablets every 12 hours.   From your responses in the eVisit questionnaire you describe inflammation in the upper respiratory tract which is causing a significant cough.  This is commonly called Bronchitis and has four common causes:    Allergies  Viral Infections  Acid Reflux  Bacterial Infection Allergies, viruses and acid reflux are treated by controlling symptoms or eliminating the cause. An example might be a cough caused by taking certain blood pressure medications. You stop the cough by changing the medication. Another example might be a cough caused by acid reflux. Controlling the reflux helps control the cough.  USE OF BRONCHODILATOR ("RESCUE") INHALERS: There is a risk from using your bronchodilator too frequently.  The risk is that over-reliance on a medication which only relaxes the muscles surrounding the breathing tubes can reduce the effectiveness of medications prescribed to reduce swelling and congestion of the tubes themselves.  Although you feel brief relief from the bronchodilator inhaler, your asthma may actually be worsening with the tubes becoming more swollen and filled with mucus.  This can delay other crucial treatments, such as oral steroid medications. If you need to use a bronchodilator inhaler daily, several times per day,  you should discuss this with your provider.  There are probably better treatments that could be used to keep your asthma under control.     HOME CARE . Only take medications as instructed by your medical team. . Complete the entire course of an antibiotic. . Drink plenty of fluids and get plenty of rest. . Avoid close contacts especially the very young and the elderly . Cover your mouth if you cough or cough into your sleeve. . Always remember to wash your hands . A steam or ultrasonic humidifier can help congestion.   GET HELP RIGHT AWAY IF: . You develop worsening fever. . You become short of breath . You cough up blood. . Your symptoms persist after you have completed your treatment plan MAKE SURE YOU   Understand these instructions.  Will watch your condition.  Will get help right away if you are not doing well or get worse.  Your e-visit answers were reviewed by a board certified advanced clinical practitioner to complete your personal care plan.  Depending on the condition, your plan could have included both over the counter or prescription medications. If there is a problem please reply  once you have received a response from your provider. Your safety is important to us.  If you have drug allergies check your prescription carefully.    You can use MyChart to ask questions about today's visit, request a non-urgent call back, or ask for a work or school excuse for 24 hours related to this e-Visit. If it has been greater than 24 hours you will need to   follow up with your provider, or enter a new e-Visit to address those concerns. You will get an e-mail in the next two days asking about your experience.  I hope that your e-visit has been valuable and will speed your recovery. Thank you for using e-visits.   

## 2018-01-11 DIAGNOSIS — Z6827 Body mass index (BMI) 27.0-27.9, adult: Secondary | ICD-10-CM | POA: Diagnosis not present

## 2018-01-11 DIAGNOSIS — Z01419 Encounter for gynecological examination (general) (routine) without abnormal findings: Secondary | ICD-10-CM | POA: Diagnosis not present

## 2018-01-11 DIAGNOSIS — Z1151 Encounter for screening for human papillomavirus (HPV): Secondary | ICD-10-CM | POA: Diagnosis not present

## 2018-01-15 ENCOUNTER — Encounter: Payer: Self-pay | Admitting: Physician Assistant

## 2018-01-15 ENCOUNTER — Ambulatory Visit (INDEPENDENT_AMBULATORY_CARE_PROVIDER_SITE_OTHER): Payer: 59 | Admitting: Physician Assistant

## 2018-01-15 VITALS — BP 123/86 | HR 94 | Wt 163.6 lb

## 2018-01-15 DIAGNOSIS — M62838 Other muscle spasm: Secondary | ICD-10-CM

## 2018-01-15 DIAGNOSIS — F439 Reaction to severe stress, unspecified: Secondary | ICD-10-CM

## 2018-01-15 DIAGNOSIS — G43009 Migraine without aura, not intractable, without status migrainosus: Secondary | ICD-10-CM | POA: Diagnosis not present

## 2018-01-15 MED ORDER — PROPRANOLOL HCL 40 MG PO TABS: 40.0000 mg | ORAL_TABLET | Freq: Every day | ORAL | 2 refills | Status: DC

## 2018-01-15 MED ORDER — HYDROCODONE-ACETAMINOPHEN 10-300 MG PO TABS: 1.0000 | ORAL_TABLET | Freq: Four times a day (QID) | ORAL | 0 refills | Status: DC | PRN

## 2018-01-15 NOTE — Progress Notes (Signed)
History:  Candice [name] is a 32 y.o. G1P1001 who presents to clinic today for migraine f/u.  She started [facility] and has used it 3 times.  She thinks it is helping some with perhaps severity of migraine but she continues to have HA most days.  Her co-worker thinks her HA is better.   She doesn't like to take Relpax at work. She takes excedrin at work and uses ice.  She uses NSAID more days than not.  She uses Hydrocodone when at work as she can still function without uncomfortable side effects. More often than not, she has a HA 3/10.   Past Medical History:  Diagnosis Date  . Anxiety   . Depression   . Migraines    a. x 10+ years.  . Postpartum care following cesarean delivery (5/29) 02/04/2014  . Pre-syncope    a. 07/2015 [facility] ED visit, ? vasovagal, improved with IVF.  [name] Short cervix in second trimester, antepartum 11/16/2013    Social History   Socioeconomic History  . Marital status: Married    Spouse name: Not on file  . Number of children: Not on file  . Years of education: Not on file  . Highest education level: Not on file  Occupational History  . Not on file  Social Needs  . Financial resource strain: Not on file  . Food insecurity:    Worry: Not on file    Inability: Not on file  . Transportation needs:    Medical: Not on file    Non-medical: Not on file  Tobacco Use  . Smoking status: Never Smoker  . Smokeless tobacco: Never Used  Substance and Sexual Activity  . Alcohol use: Yes    Alcohol/week: 0.0 oz    Comment: maybe two drinks/month.  . Drug use: No  . Sexual activity: Yes    Birth control/protection: Implant  Lifestyle  . Physical activity:    Days per week: Not on file    Minutes per session: Not on file  . Stress: Not on file  Relationships  . Social connections:    Talks on phone: Not on file    Gets together: Not on file    Attends religious service: Not on file    Active member of club or organization: Not on file    Attends meetings  of clubs or organizations: Not on file    Relationship status: Not on file  . Intimate partner violence:    Fear of current or ex partner: Not on file    Emotionally abused: Not on file    Physically abused: Not on file    Forced sexual activity: Not on file  Other Topics Concern  . Not on file  Social History Narrative   Lives in [place] with her husband and 59 month old dtr.  Does not routinely exercise.        Family History  Problem Relation Age of Onset  . Depression Mother   . Anxiety disorder Mother   . Heart attack Mother        NSTEMI @ age 63.  [name] Alcohol abuse Paternal Grandfather   . Sleep apnea Father        alive @ 62.  [name] Heart attack Maternal Grandfather   . Anxiety disorder Maternal Grandmother   . Depression Maternal Grandmother   . Diabetes Maternal Grandmother   . Hypertension Maternal Grandmother     Allergies  Allergen Reactions  . Sulfa Antibiotics Other (See  Comments)  . Sulfur Hives    Current Outpatient Medications on File Prior to Visit  Medication Sig Dispense Refill  . ALPRAZolam (XANAX) 0.25 MG tablet Take 1 tablet (0.25 mg total) by mouth daily as needed for anxiety. 30 tablet 0  . cyclobenzaprine (FLEXERIL) 10 MG tablet Take 1 tablet (10 mg total) by mouth every 8 (eight) hours as needed for muscle spasms. 30 tablet 1  . eletriptan (RELPAX) 40 MG tablet Take 1 tablet (40 mg total) by mouth as needed for migraine or headache. May repeat in 2 hours if headache persists or recurs. 10 tablet 11  . escitalopram (LEXAPRO) 20 MG tablet Take 1 tablet (20 mg total) by mouth daily. 30 tablet 2  . fluticasone (FLONASE) 50 MCG/ACT nasal spray Place 2 sprays into both nostrils daily. 16 g 0  . Galcanezumab-gnlm (EMGALITY) 120 MG/ML SOAJ Inject 1 pen into the skin every 30 (thirty) days. 1 pen 11  . Hydrocodone-Acetaminophen 10-300 MG TABS Take 1-2 tablets by mouth every 6 (six) hours as needed. 20 each 0  . naproxen sodium (ANAPROX DS) 550 MG tablet Take  1 tablet (550 mg total) by mouth as needed for headache. 60 tablet 1  . norethindrone (MICRONOR,CAMILA,ERRIN) 0.35 MG tablet   0  . ondansetron (ZOFRAN) 4 MG tablet   1  . ondansetron (ZOFRAN-ODT) 4 MG disintegrating tablet Take 1 tablet (4 mg total) by mouth every 8 (eight) hours as needed for nausea or vomiting. 20 tablet 0  . promethazine (PHENERGAN) 25 MG suppository Place 1 suppository (25 mg total) rectally every 6 (six) hours as needed for nausea or vomiting. 12 each 0  . SUMAtriptan (IMITREX) 100 MG tablet Take 1 tablet (100 mg total) by mouth as needed. 12 tablet 11  . SUMAtriptan 6 MG/0.5ML SOAJ Inject 1 cartridge into the skin as needed (migraine). May repeat in 2 hours.  No more than 2 doses of Imitrex per day. 12 Cartridge 3  . valACYclovir (VALTREX) 1000 MG tablet   6  . azithromycin (ZITHROMAX Z-PAK) 250 MG tablet As directed (Patient not taking: Reported on 01/15/2018) 6 tablet 0  . ondansetron (ZOFRAN-ODT) 4 MG disintegrating tablet DISSOLVE 1 TABLET BY MOUTH EVERY 8 (EIGHT) HOURS AS NEEDED FOR NAUSEA OR VOMITING. (Patient not taking: Reported on 01/15/2018) 20 tablet 1  . promethazine (PHENERGAN) 25 MG tablet Take 1 tablet (25 mg total) by mouth every 8 (eight) hours as needed for nausea or vomiting. 30 tablet 0   No current facility-administered medications on file prior to visit.      Review of Systems:  All pertinent positive/negative included in HPI, all other review of systems are negative   Objective:  Physical Exam BP 123/86   Pulse 94   Wt 163 lb 9.6 oz (74.2 kg)   BMI 28.08 kg/m  CONSTITUTIONAL: Well-developed, well-nourished female in no acute distress.  EYES: EOM intact ENT: Normocephalic CARDIOVASCULAR: Regular rate   RESPIRATORY: Normal rate.   MUSCULOSKELETAL: Normal ROM SKIN: Warm, dry without erythema  NEUROLOGICAL: Alert, oriented, CN II-XII grossly intact, Appropriate balance PSYCH: Normal behavior, mood   Assessment & Plan:  Assessment: 1.  Migraine without aura and without status migrainosus, not intractable   2. Muscle spasm   3. Stress at home    Insufficent improvement (if any ) in migraine  Plan: Will provide sample of emgality for her to use additional dose.  I'm hoping this will bring about more efficacy of this drug.  Additionally, she will begin  Inderal for migraine prevention -  daily Continue Relpax for acute therapy with NSAID Hydrocodone for rescue.  Pt advised to limit. Follow-up in 3 months or sooner PRN  Bertram Denver, PA-C 01/15/2018 8:16 AM

## 2018-01-15 NOTE — Patient Instructions (Signed)

## 2018-01-22 ENCOUNTER — Telehealth: Payer: 59 | Admitting: Family

## 2018-01-22 DIAGNOSIS — B009 Herpesviral infection, unspecified: Secondary | ICD-10-CM | POA: Diagnosis not present

## 2018-01-22 MED ORDER — VALACYCLOVIR HCL 1 G PO TABS: 2000.0000 mg | ORAL_TABLET | Freq: Two times a day (BID) | ORAL | 0 refills | Status: DC

## 2018-01-22 NOTE — Progress Notes (Signed)
Thank you for the details you included in the comment boxes. Those details are very helpful in determining the best course of treatment for you and help us to provide the best care.  We are sorry that you are not feeling well.  Here is how we plan to help!  Based on what you have shared with me it does look like you have a viral infection.    Most cold sores or fever blisters are small fluid filled blisters around the mouth caused by herpes simplex virus.  The most common strain of the virus causing cold sores is herpes simplex virus 1.  It can be spread by skin contact, sharing eating utensils, or even sharing towels.  Cold sores are contagious to other people until dry. (Approximately 5-7 days).  Wash your hands. You can spread the virus to your eyes through handling your contact lenses after touching the lesions.  Most people experience pain at the sight or tingling sensations in their lips that may begin before the ulcers erupt.  Herpes simplex is treatable but not curable.  It may lie dormant for a long time and then reappear due to stress or prolonged sun exposure.  Many patients have success in treating their cold sores with an over the counter topical called Abreva.  You may apply the cream up to 5 times daily (maximum 10 days) until healing occurs.  If you would like to use an oral antiviral medication to speed the healing of your cold sore, I have sent a prescription to your local pharmacy Valacyclovir 2 gm twice daily for 1 day    HOME CARE:   Wash your hands frequently.  Do not pick at or rub the sore.  Don't open the blisters.  Avoid kissing other people during this time.  Avoid sharing drinking glasses, eating utensils, or razors.  Do not handle contact lenses unless you have thoroughly washed your hands with soap and warm water!  Avoid oral sex during this time.  Herpes from sores on your mouth can spread to your partner's genital area.  Avoid contact with anyone who has  eczema or a weakened immune system.  Cold sores are often triggered by exposure to intense sunlight, use a lip balm containing a sunscreen (SPF 30 or higher).  GET HELP RIGHT AWAY IF:   Blisters look infected.  Blisters occur near or in the eye.  Symptoms last longer than 10 days.  Your symptoms become worse.  MAKE SURE YOU:   Understand these instructions.  Will watch your condition.  Will get help right away if you are not doing well or get worse.    Your e-visit answers were reviewed by a board certified advanced clinical practitioner to complete your personal care plan.  Depending upon the condition, your plan could have  Included both over the counter or prescription medications.    Please review your pharmacy choice.  Be sure that the pharmacy you have chosen is open so that you can pick up your prescription now.  If there is a problem you csn message your provider in MyChart to have the prescription routed to another pharmacy.    Your safety is important to us.  If you have drug allergies check our prescription carefully.  For the next 24 hours you can use MyChart to ask questions about today's visit, request a non-urgent call back, or ask for a work or school excuse from your e-visit provider.  You will get an email in the   next two days asking about your experience.  I hope that your e-visit has been valuable and will speed your recovery.   

## 2018-02-16 ENCOUNTER — Other Ambulatory Visit: Payer: Self-pay

## 2018-02-16 ENCOUNTER — Ambulatory Visit: Payer: 59 | Admitting: Psychiatry

## 2018-02-16 ENCOUNTER — Encounter: Payer: Self-pay | Admitting: Psychiatry

## 2018-02-16 VITALS — BP 127/86 | HR 101 | Temp 97.9°F | Wt 163.6 lb

## 2018-02-16 DIAGNOSIS — F330 Major depressive disorder, recurrent, mild: Secondary | ICD-10-CM

## 2018-02-16 DIAGNOSIS — F331 Major depressive disorder, recurrent, moderate: Secondary | ICD-10-CM | POA: Diagnosis not present

## 2018-02-16 MED ORDER — ESCITALOPRAM OXALATE 20 MG PO TABS: 20.0000 mg | ORAL_TABLET | Freq: Every day | ORAL | 2 refills | Status: DC

## 2018-02-16 NOTE — Progress Notes (Signed)
Patient ID: Candice [name], female   DOB: 07/04/1986, 32 y.o.   MRN: [id number]   [facility]NP OP Progress Note  02/16/2018 3:05 PChapman FitchM Candice FitchStephanie T [name]  MRN:  [id number]  Subjective:  Patient returns for follow-up of her major depressive disorder. She reports that she has been doing okay. States her job continues to be busy , she is in the process of training other people. She has been taking  Lexapro at 20 mg.Denies any mood or anxiety symptoms. States that she does not let go of things and has to take care of things.  Denies any suicidal thoughts.  Chief Complaint: doing okay Chief Complaint    Follow-up; Medication Refill     Visit Diagnosis:     ICD-10-CM   1. Major depressive disorder, recurrent episode, moderate (HCC) F33.1     Past Medical History:  Past Medical History:  Diagnosis Date  . Anxiety   . Depression   . Migraines    a. x 10+ years.  . Postpartum care following cesarean delivery (5/29) 02/04/2014  . Pre-syncope    a. 07/2015 [facility] ED visit, ? vasovagal, improved with IVF.  [name]. Short cervix in second trimester, antepartum 11/16/2013    Past Surgical History:  Procedure Laterality Date  . CESAREAN SECTION N/A 02/03/2014   Procedure: Primary Cesarean Section Delivery Baby Girl @ 2244, Apgars 9/9;  Surgeon: Candice KyleSheronette A Cousins, MD;  Location: [facility];  Service: Obstetrics;  Laterality: N/A;  . CESAREAN SECTION    . WISDOM TOOTH EXTRACTION  2002   Family History:  Family History  Problem Relation Age of Onset  . Depression Mother   . Anxiety disorder Mother   . Heart attack Mother        NSTEMI @ age 34.  [name]. Alcohol abuse Paternal Grandfather   . Sleep apnea Father        alive @ 3856.  [name]. Heart attack Maternal Grandfather   . Anxiety disorder Maternal Grandmother   . Depression Maternal Grandmother   . Diabetes Maternal Grandmother   . Hypertension Maternal Grandmother    Social History:  Social History   Socioeconomic History  . Marital status: Married     Spouse name: Not on file  . Number of children: Not on file  . Years of education: Not on file  . Highest education level: Not on file  Occupational History  . Not on file  Social Needs  . Financial resource strain: Not on file  . Food insecurity:    Worry: Not on file    Inability: Not on file  . Transportation needs:    Medical: Not on file    Non-medical: Not on file  Tobacco Use  . Smoking status: Never Smoker  . Smokeless tobacco: Never Used  Substance and Sexual Activity  . Alcohol use: Yes    Alcohol/week: 0.0 oz    Comment: maybe two drinks/month.  . Drug use: No  . Sexual activity: Yes    Birth control/protection: Implant  Lifestyle  . Physical activity:    Days per week: Not on file    Minutes per session: Not on file  . Stress: Not on file  Relationships  . Social connections:    Talks on phone: Not on file    Gets together: Not on file    Attends religious service: Not on file    Active member of club or organization: Not on file    Attends meetings of clubs or  organizations: Not on file    Relationship status: Not on file  Other Topics Concern  . Not on file  Social History Narrative   [place] in [name] with her husband and 17 month old dtr.  Does not routinely exercise.       Additional History:   Assessment:   Musculoskeletal: Strength & Muscle Tone: within normal limits Gait & Station: normal Patient leans: N/A  Psychiatric Specialty Exam: Medication Refill   Anxiety  Patient reports no insomnia, nervous/anxious behavior or suicidal ideas.      Review of Systems  Psychiatric/Behavioral: Negative for depression, hallucinations, memory loss, substance abuse and suicidal ideas. The patient is not nervous/anxious and does not have insomnia.        Weight gain over past several months  All other systems reviewed and are negative.   Blood pressure 127/86, pulse (!) 101, temperature 97.9 F (36.6 C), temperature source Oral, weight 74.2  kg (163 lb 9.6 oz), currently breastfeeding.Body mass index is 28.08 kg/m.  General Appearance: Neat and Well Groomed  Eye Contact:  Good  Speech:  Clear and Coherent and Normal Rate  Volume:  Normal  Mood:  ok  Affect:  Bright smile  Thought Process:  Linear and Logical  Orientation:  Full (Time, Place, and Person)  Thought Content:  Negative  Suicidal Thoughts:  No  Homicidal Thoughts:  No  Memory:  Immediate;   Good Recent;   Good Remote;   Good  Judgement:  Good  Insight:  Good  Psychomotor Activity:  Negative  Concentration:  Good  Recall:  Good  Fund of Knowledge: Good  Language: Good  Akathisia:  Negative  Handed:  Right unknown  AIMS (if indicated):  Not done  Assets:  Communication Skills Desire for Improvement Social Support Vocational/Educational  ADL's:  Intact  Cognition: WNL  Sleep:  Okay    Is the patient at risk to self?  No. Has the patient been a risk to self in the past 6 months?  No. Has the patient been a risk to self within the distant past?  No. Is the patient a risk to others?  No. Has the patient been a risk to others in the past 6 months?  No. Has the patient been a risk to others within the distant past?  No.  Current Medications: Current Outpatient Medications  Medication Sig Dispense Refill  . ALPRAZolam (XANAX) 0.25 MG tablet Take 1 tablet (0.25 mg total) by mouth daily as needed for anxiety. 30 tablet 0  . azithromycin (ZITHROMAX Z-PAK) 250 MG tablet As directed 6 tablet 0  . cyclobenzaprine (FLEXERIL) 10 MG tablet Take 1 tablet (10 mg total) by mouth every 8 (eight) hours as needed for muscle spasms. 30 tablet 1  . eletriptan (RELPAX) 40 MG tablet Take 1 tablet (40 mg total) by mouth as needed for migraine or headache. May repeat in 2 hours if headache persists or recurs. 10 tablet 11  . escitalopram (LEXAPRO) 20 MG tablet Take 1 tablet (20 mg total) by mouth daily. 30 tablet 2  . fluticasone (FLONASE) 50 MCG/ACT nasal spray Place 2  sprays into both nostrils daily. 16 g 0  . Galcanezumab-gnlm (EMGALITY) 120 MG/ML SOAJ Inject 1 pen into the skin every 30 (thirty) days. 1 pen 11  . HYDROcodone-Acetaminophen 10-300 MG TABS Take 1-2 tablets by mouth every 6 (six) hours as needed. 20 each 0  . naproxen sodium (ANAPROX DS) 550 MG tablet Take 1 tablet (550 mg total) by  mouth as needed for headache. 60 tablet 1  . norethindrone (MICRONOR,CAMILA,ERRIN) 0.35 MG tablet   0  . ondansetron (ZOFRAN) 4 MG tablet   1  . ondansetron (ZOFRAN-ODT) 4 MG disintegrating tablet Take 1 tablet (4 mg total) by mouth every 8 (eight) hours as needed for nausea or vomiting. 20 tablet 0  . ondansetron (ZOFRAN-ODT) 4 MG disintegrating tablet DISSOLVE 1 TABLET BY MOUTH EVERY 8 (EIGHT) HOURS AS NEEDED FOR NAUSEA OR VOMITING. 20 tablet 1  . promethazine (PHENERGAN) 25 MG suppository Place 1 suppository (25 mg total) rectally every 6 (six) hours as needed for nausea or vomiting. 12 each 0  . promethazine (PHENERGAN) 25 MG tablet Take 1 tablet (25 mg total) by mouth every 8 (eight) hours as needed for nausea or vomiting. 30 tablet 0  . propranolol (INDERAL) 40 MG tablet Take 1 tablet (40 mg total) by mouth daily. 1 tablet 2  . SUMAtriptan (IMITREX) 100 MG tablet Take 1 tablet (100 mg total) by mouth as needed. 12 tablet 11  . SUMAtriptan 6 MG/0.5ML SOAJ Inject 1 cartridge into the skin as needed (migraine). May repeat in 2 hours.  No more than 2 doses of Imitrex per day. 12 Cartridge 3  . valACYclovir (VALTREX) 1000 MG tablet Take 2 tablets (2,000 mg total) by mouth 2 (two) times daily. And may repeat in the future if needed 8 tablet 0   No current facility-administered medications for this visit.     Medical Decision Making:  Established Problem, Stable/Improving (1) patient indicates some improvement on medication but wonders whether she could feel even better with a higher dose. she is denying any side effects on the Lexapro.  Treatment Plan  Summary:Medication management   Major depressive disorder, recurrent, moderate In remission Continue Lexapro 20 mg and take it at bedtime Continue therapy with Nolon Rod.  Other anxiety disorder- alprazolam 0.25 mg daily as needed for anxiety/panic.  Patient will follow up in 3 month or call before if needed  .Catie Chiao 02/16/2018, 3:05 PM

## 2018-02-18 ENCOUNTER — Telehealth: Payer: Self-pay | Admitting: Radiology

## 2018-02-18 ENCOUNTER — Ambulatory Visit: Payer: 59 | Admitting: Psychiatry

## 2018-02-18 NOTE — Telephone Encounter (Signed)
Left voicemail for patient to schedule headache f/u appointment in August with Nada MaclachlanKaren Teague Clark, phone number provided

## 2018-03-08 ENCOUNTER — Other Ambulatory Visit: Payer: Self-pay | Admitting: Physician Assistant

## 2018-03-17 ENCOUNTER — Other Ambulatory Visit: Payer: Self-pay

## 2018-03-25 ENCOUNTER — Encounter: Payer: Self-pay | Admitting: Physician Assistant

## 2018-03-29 ENCOUNTER — Telehealth: Payer: Self-pay | Admitting: Physician Assistant

## 2018-03-29 NOTE — Telephone Encounter (Signed)
Pt signed release and brought FMLA forms to be completed and faxed to [employer] fax [phone].

## 2018-04-06 ENCOUNTER — Encounter: Payer: Self-pay | Admitting: Physician Assistant

## 2018-04-09 ENCOUNTER — Encounter: Payer: Self-pay | Admitting: Physician Assistant

## 2018-04-09 ENCOUNTER — Ambulatory Visit: Payer: 59 | Admitting: Physician Assistant

## 2018-04-09 DIAGNOSIS — G43009 Migraine without aura, not intractable, without status migrainosus: Secondary | ICD-10-CM | POA: Diagnosis not present

## 2018-04-09 DIAGNOSIS — M62838 Other muscle spasm: Secondary | ICD-10-CM | POA: Diagnosis not present

## 2018-04-09 DIAGNOSIS — G43709 Chronic migraine without aura, not intractable, without status migrainosus: Secondary | ICD-10-CM

## 2018-04-09 DIAGNOSIS — IMO0002 Reserved for concepts with insufficient information to code with codable children: Secondary | ICD-10-CM

## 2018-04-09 MED ORDER — LEVONORGESTREL-ETHINYL ESTRAD 0.15-30 MG-MCG PO TABS: 1.0000 | ORAL_TABLET | Freq: Every day | ORAL | 5 refills | Status: DC

## 2018-04-09 MED ORDER — CYCLOBENZAPRINE HCL 10 MG PO TABS: 10.0000 mg | ORAL_TABLET | Freq: Three times a day (TID) | ORAL | 1 refills | Status: DC | PRN

## 2018-04-09 MED ORDER — ONDANSETRON 4 MG PO TBDP: ORAL_TABLET | ORAL | 1 refills | Status: DC

## 2018-04-09 MED ORDER — HYDROCODONE-ACETAMINOPHEN 10-300 MG PO TABS: 1.0000 | ORAL_TABLET | Freq: Four times a day (QID) | ORAL | 0 refills | Status: DC | PRN

## 2018-04-09 MED ORDER — PROPRANOLOL HCL 40 MG PO TABS: 40.0000 mg | ORAL_TABLET | Freq: Every day | ORAL | 2 refills | Status: DC

## 2018-04-09 MED ORDER — ZOLMITRIPTAN 5 MG NA SOLN: 1.0000 | NASAL | 5 refills | Status: DC | PRN

## 2018-04-09 NOTE — Progress Notes (Signed)
History:  Candice [name] is a 32 y.o. G1P1001 who presents to clinic today for migraine headache followup.  She started the [facility] but really hasn't seen significant results.  She also started Inderal without noticing much change.  She has been having issues with dizziness and also notes issues with her hearing.  The headaches have not worsened but they are definitely not better.  She has a baseline headache nearly all the time.  They seem to be around her period and she is having 2 periods per month on her current OCP which is camilla, a POP.     Life at home is more stable now but still not great.    HIT6: 82   Past Medical History:  Diagnosis Date  . Anxiety   . Depression   . Migraines    a. x 10+ years.  . Postpartum care following cesarean delivery (5/29) 02/04/2014  . Pre-syncope    a. 07/2015 [facility] ED visit, ? vasovagal, improved with IVF.  [name] Short cervix in second trimester, antepartum 11/16/2013    Social History   Socioeconomic History  . Marital status: Married    Spouse name: Not on file  . Number of children: Not on file  . Years of education: Not on file  . Highest education level: Not on file  Occupational History  . Not on file  Social Needs  . Financial resource strain: Not on file  . Food insecurity:    Worry: Not on file    Inability: Not on file  . Transportation needs:    Medical: Not on file    Non-medical: Not on file  Tobacco Use  . Smoking status: Never Smoker  . Smokeless tobacco: Never Used  Substance and Sexual Activity  . Alcohol use: Yes    Alcohol/week: 0.0 oz    Comment: maybe two drinks/month.  . Drug use: No  . Sexual activity: Yes    Birth control/protection: Implant  Lifestyle  . Physical activity:    Days per week: Not on file    Minutes per session: Not on file  . Stress: Not on file  Relationships  . Social connections:    Talks on phone: Not on file    Gets together: Not on file    Attends religious service: Not on  file    Active member of club or organization: Not on file    Attends meetings of clubs or organizations: Not on file    Relationship status: Not on file  . Intimate partner violence:    Fear of current or ex partner: Not on file    Emotionally abused: Not on file    Physically abused: Not on file    Forced sexual activity: Not on file  Other Topics Concern  . Not on file  Social History Narrative   Lives in [place] with her husband and 69 month old dtr.  Does not routinely exercise.        Family History  Problem Relation Age of Onset  . Depression Mother   . Anxiety disorder Mother   . Heart attack Mother        NSTEMI @ age 84.  [name] Alcohol abuse Paternal Grandfather   . Sleep apnea Father        alive @ 18.  [name] Heart attack Maternal Grandfather   . Anxiety disorder Maternal Grandmother   . Depression Maternal Grandmother   . Diabetes Maternal Grandmother   . Hypertension Maternal Grandmother  Allergies  Allergen Reactions  . Sulfa Antibiotics Other (See Comments)  . Sulfur Hives    Current Outpatient Medications on File Prior to Visit  Medication Sig Dispense Refill  . ALPRAZolam (XANAX) 0.25 MG tablet Take 1 tablet (0.25 mg total) by mouth daily as needed for anxiety. 30 tablet 0  . escitalopram (LEXAPRO) 20 MG tablet Take 1 tablet (20 mg total) by mouth daily. 30 tablet 2  . Galcanezumab-gnlm (EMGALITY) 120 MG/ML SOAJ Inject 1 pen into the skin every 30 (thirty) days. 1 pen 11  . norethindrone (MICRONOR,CAMILA,ERRIN) 0.35 MG tablet   0  . promethazine (PHENERGAN) 25 MG suppository Place 1 suppository (25 mg total) rectally every 6 (six) hours as needed for nausea or vomiting. 12 each 0  . promethazine (PHENERGAN) 25 MG tablet Take 1 tablet (25 mg total) by mouth every 8 (eight) hours as needed for nausea or vomiting. 30 tablet 0  . SUMAtriptan (IMITREX) 100 MG tablet Take 1 tablet (100 mg total) by mouth as needed. 12 tablet 11  . valACYclovir (VALTREX) 1000 MG  tablet Take 2 tablets (2,000 mg total) by mouth 2 (two) times daily. And may repeat in the future if needed 8 tablet 0  . azithromycin (ZITHROMAX Z-PAK) 250 MG tablet As directed (Patient not taking: Reported on 04/09/2018) 6 tablet 0  . eletriptan (RELPAX) 40 MG tablet Take 1 tablet (40 mg total) by mouth as needed for migraine or headache. May repeat in 2 hours if headache persists or recurs. (Patient not taking: Reported on 04/09/2018) 10 tablet 11  . fluticasone (FLONASE) 50 MCG/ACT nasal spray Place 2 sprays into both nostrils daily. (Patient not taking: Reported on 04/09/2018) 16 g 0  . naproxen sodium (ANAPROX DS) 550 MG tablet Take 1 tablet (550 mg total) by mouth as needed for headache. (Patient not taking: Reported on 04/09/2018) 60 tablet 1  . SUMAtriptan 6 MG/0.5ML SOAJ Inject 1 cartridge into the skin as needed (migraine). May repeat in 2 hours.  No more than 2 doses of Imitrex per day. (Patient not taking: Reported on 04/09/2018) 12 Cartridge 3   No current facility-administered medications on file prior to visit.      Review of Systems:  All pertinent positive/negative included in HPI, all other review of systems are negative   Objective:  Physical Exam BP 115/79   Pulse 72   Wt 164 lb 6.4 oz (74.6 kg)   BMI 28.22 kg/m  CONSTITUTIONAL: Well-developed, well-nourished female in no acute distress.  EYES: EOM intact ENT: Normocephalic CARDIOVASCULAR: Regular rate   RESPIRATORY: Normal rate. MUSCULOSKELETAL: Normal ROM SKIN: Warm, dry without erythema  NEUROLOGICAL: Alert, oriented, CN II-XII grossly intact, Appropriate balance PSYCH: Normal behavior, mood   Assessment & Plan:  Assessment: 1. Chronic migraine   2. Migraine without aura and without status migrainosus, not intractable   3. Muscle spasm    New diagnosis of chronic migraine as her migraine is worsening instead of improving  Plan: Continue Emgality and Inderal for now.  May consider adding Topamax back at a later  date.   Will [facility], an estrogen containing pill - as patient does not have aura.  She will use active pills only to eliminate her period and hopefully, her headaches too.  Trial zomig nasal spray as relpax is not always helpful.  Do NOT combine triptans within 24 hours.    Follow-up in 3 months or sooner PRN  Bertram Denvereague Clark, Patti Shorb E, PA-C 04/09/2018 11:19 AM

## 2018-04-09 NOTE — Patient Instructions (Signed)

## 2018-05-18 ENCOUNTER — Encounter: Payer: Self-pay | Admitting: Psychiatry

## 2018-05-18 ENCOUNTER — Ambulatory Visit (INDEPENDENT_AMBULATORY_CARE_PROVIDER_SITE_OTHER): Payer: 59 | Admitting: Psychiatry

## 2018-05-18 DIAGNOSIS — F330 Major depressive disorder, recurrent, mild: Secondary | ICD-10-CM | POA: Diagnosis not present

## 2018-05-18 MED ORDER — ESCITALOPRAM OXALATE 20 MG PO TABS
20.0000 mg | ORAL_TABLET | Freq: Every day | ORAL | 2 refills | Status: DC
Start: 2018-05-18 — End: 2018-07-13

## 2018-05-18 MED ORDER — ALPRAZOLAM 0.25 MG PO TABS: 0.2500 mg | ORAL_TABLET | Freq: Every day | ORAL | 0 refills | Status: DC | PRN

## 2018-05-18 NOTE — Progress Notes (Signed)
Patient ID: Candice [name], female   DOB: 24-Oct-1985, 32 y.o.   MRN: [id number]   [facility] MD/PA/NP OP Progress Note  05/18/2018 2:52 PM Candice [name]  MRN:  [id number]  Subjective:  Patient returns for follow-up of her major depressive disorder. She reports that she has been doing okay. States her job has been quite stressful, states they are very short staffed. She had a stressful incident at work but it turned out okay. . She has been taking  Lexapro at 20 mg.Denies any mood or anxiety symptoms. States that she does not let go of things and has to take care of things.  Denies any suicidal thoughts.  Chief Complaint: doing okay  Visit Diagnosis:     ICD-10-CM   1. MDD (major depressive disorder), recurrent episode, mild (HCC) F33.0 escitalopram (LEXAPRO) 20 MG tablet    Past Medical History:  Past Medical History:  Diagnosis Date  . Anxiety   . Depression   . Migraines    a. x 10+ years.  . Postpartum care following cesarean delivery (5/29) 02/04/2014  . Pre-syncope    a. 07/2015 [facility] ED visit, ? vasovagal, improved with IVF.  [name] Short cervix in second trimester, antepartum 11/16/2013    Past Surgical History:  Procedure Laterality Date  . CESAREAN SECTION N/A 02/03/2014   Procedure: Primary Cesarean Section Delivery Baby Girl @ 2244, Apgars 9/9;  Surgeon: Serita Kyle, MD;  Location: [facility];  Service: Obstetrics;  Laterality: N/A;  . CESAREAN SECTION    . WISDOM TOOTH EXTRACTION  2002   Family History:  Family History  Problem Relation Age of Onset  . Depression Mother   . Anxiety disorder Mother   . Heart attack Mother        NSTEMI @ age 42.  [name] Alcohol abuse Paternal Grandfather   . Sleep apnea Father        alive @ 32.  [name] Heart attack Maternal Grandfather   . Anxiety disorder Maternal Grandmother   . Depression Maternal Grandmother   . Diabetes Maternal Grandmother   . Hypertension Maternal Grandmother    Social History:  Social History    Socioeconomic History  . Marital status: Married    Spouse name: Not on file  . Number of children: Not on file  . Years of education: Not on file  . Highest education level: Not on file  Occupational History  . Not on file  Social Needs  . Financial resource strain: Not on file  . Food insecurity:    Worry: Not on file    Inability: Not on file  . Transportation needs:    Medical: Not on file    Non-medical: Not on file  Tobacco Use  . Smoking status: Never Smoker  . Smokeless tobacco: Never Used  Substance and Sexual Activity  . Alcohol use: Yes    Alcohol/week: 0.0 standard drinks    Comment: maybe two drinks/month.  . Drug use: No  . Sexual activity: Yes    Birth control/protection: Implant  Lifestyle  . Physical activity:    Days per week: Not on file    Minutes per session: Not on file  . Stress: Not on file  Relationships  . Social connections:    Talks on phone: Not on file    Gets together: Not on file    Attends religious service: Not on file    Active member of club or organization: Not on file    Attends  meetings of clubs or organizations: Not on file    Relationship status: Not on file  Other Topics Concern  . Not on file  Social History Narrative   Lives in [place] with her husband and 104 month old dtr.  Does not routinely exercise.       Additional History:   Assessment:   Musculoskeletal: Strength & Muscle Tone: within normal limits Gait & Station: normal Patient leans: N/A  Psychiatric Specialty Exam: Medication Refill   Anxiety  Patient reports no insomnia, nervous/anxious behavior or suicidal ideas.      Review of Systems  Psychiatric/Behavioral: Negative for depression, hallucinations, memory loss, substance abuse and suicidal ideas. The patient is not nervous/anxious and does not have insomnia.        Weight gain over past several months  All other systems reviewed and are negative.   currently breastfeeding.There is no  height or weight on file to calculate BMI.  General Appearance: Neat and Well Groomed  Eye Contact:  Good  Speech:  Clear and Coherent and Normal Rate  Volume:  Normal  Mood:  ok  Affect:  Bright smile  Thought Process:  Linear and Logical  Orientation:  Full (Time, Place, and Person)  Thought Content:  Negative  Suicidal Thoughts:  No  Homicidal Thoughts:  No  Memory:  Immediate;   Good Recent;   Good Remote;   Good  Judgement:  Good  Insight:  Good  Psychomotor Activity:  Negative  Concentration:  Good  Recall:  Good  Fund of Knowledge: Good  Language: Good  Akathisia:  Negative  Handed:  Right unknown  AIMS (if indicated):  Not done  Assets:  Communication Skills Desire for Improvement Social Support Vocational/Educational  ADL's:  Intact  Cognition: WNL  Sleep:  Okay    Is the patient at risk to self?  No. Has the patient been a risk to self in the past 6 months?  No. Has the patient been a risk to self within the distant past?  No. Is the patient a risk to others?  No. Has the patient been a risk to others in the past 6 months?  No. Has the patient been a risk to others within the distant past?  No.  Current Medications: Current Outpatient Medications  Medication Sig Dispense Refill  . ALPRAZolam (XANAX) 0.25 MG tablet Take 1 tablet (0.25 mg total) by mouth daily as needed for anxiety. 30 tablet 0  . azithromycin (ZITHROMAX Z-PAK) 250 MG tablet As directed (Patient not taking: Reported on 04/09/2018) 6 tablet 0  . cyclobenzaprine (FLEXERIL) 10 MG tablet Take 1 tablet (10 mg total) by mouth every 8 (eight) hours as needed for muscle spasms. 30 tablet 1  . eletriptan (RELPAX) 40 MG tablet Take 1 tablet (40 mg total) by mouth as needed for migraine or headache. May repeat in 2 hours if headache persists or recurs. (Patient not taking: Reported on 04/09/2018) 10 tablet 11  . escitalopram (LEXAPRO) 20 MG tablet Take 1 tablet (20 mg total) by mouth daily. 30 tablet 2  .  fluticasone (FLONASE) 50 MCG/ACT nasal spray Place 2 sprays into both nostrils daily. (Patient not taking: Reported on 04/09/2018) 16 g 0  . Galcanezumab-gnlm (EMGALITY) 120 MG/ML SOAJ Inject 1 pen into the skin every 30 (thirty) days. 1 pen 11  . HYDROcodone-Acetaminophen 10-300 MG TABS Take 1-2 tablets by mouth every 6 (six) hours as needed. 20 each 0  . levonorgestrel-ethinyl estradiol (KURVELO) 0.15-30 MG-MCG tablet Take 1  tablet by mouth daily. Active pills only 3 Package 5  . naproxen sodium (ANAPROX DS) 550 MG tablet Take 1 tablet (550 mg total) by mouth as needed for headache. (Patient not taking: Reported on 04/09/2018) 60 tablet 1  . norethindrone (MICRONOR,CAMILA,ERRIN) 0.35 MG tablet   0  . ondansetron (ZOFRAN-ODT) 4 MG disintegrating tablet DISSOLVE 1 TABLET BY MOUTH EVERY 8 (EIGHT) HOURS AS NEEDED FOR NAUSEA OR VOMITING. 20 tablet 1  . promethazine (PHENERGAN) 25 MG suppository Place 1 suppository (25 mg total) rectally every 6 (six) hours as needed for nausea or vomiting. 12 each 0  . promethazine (PHENERGAN) 25 MG tablet Take 1 tablet (25 mg total) by mouth every 8 (eight) hours as needed for nausea or vomiting. 30 tablet 0  . propranolol (INDERAL) 40 MG tablet Take 1 tablet (40 mg total) by mouth daily. 30 tablet 2  . SUMAtriptan (IMITREX) 100 MG tablet Take 1 tablet (100 mg total) by mouth as needed. 12 tablet 11  . SUMAtriptan 6 MG/0.5ML SOAJ Inject 1 cartridge into the skin as needed (migraine). May repeat in 2 hours.  No more than 2 doses of Imitrex per day. (Patient not taking: Reported on 04/09/2018) 12 Cartridge 3  . valACYclovir (VALTREX) 1000 MG tablet Take 2 tablets (2,000 mg total) by mouth 2 (two) times daily. And may repeat in the future if needed 8 tablet 0  . zolmitriptan (ZOMIG) 5 MG nasal solution Place 1 spray into the nose as needed for migraine. 6 Units 5   No current facility-administered medications for this visit.     Medical Decision Making:  Established Problem,  Stable/Improving (1) patient indicates some improvement on medication but wonders whether she could feel even better with a higher dose. she is denying any side effects on the Lexapro.  Treatment Plan Summary:Medication management   Major depressive disorder, recurrent, moderate In remission Continue Lexapro 20 mg and take it at bedtime Continue therapy with Nolon Rod.  Other anxiety disorder- alprazolam 0.25 mg daily as needed for anxiety/panic.  Patient will follow up in 3 month or call before if needed  .Ronn Smolinsky 05/18/2018, 2:52 PM

## 2018-06-07 ENCOUNTER — Telehealth: Payer: Self-pay | Admitting: Radiology

## 2018-06-07 NOTE — Telephone Encounter (Signed)
Left message to call [id number] to schedule appointment with Hedy Jacob for headache f/u in November.

## 2018-06-15 ENCOUNTER — Other Ambulatory Visit: Payer: Self-pay | Admitting: Unknown Physician Specialty

## 2018-06-15 DIAGNOSIS — H90A22 Sensorineural hearing loss, unilateral, left ear, with restricted hearing on the contralateral side: Secondary | ICD-10-CM | POA: Diagnosis not present

## 2018-06-15 DIAGNOSIS — H903 Sensorineural hearing loss, bilateral: Secondary | ICD-10-CM

## 2018-06-15 DIAGNOSIS — H918X9 Other specified hearing loss, unspecified ear: Secondary | ICD-10-CM

## 2018-06-15 DIAGNOSIS — IMO0001 Reserved for inherently not codable concepts without codable children: Secondary | ICD-10-CM

## 2018-06-15 DIAGNOSIS — R42 Dizziness and giddiness: Secondary | ICD-10-CM | POA: Diagnosis not present

## 2018-06-21 DIAGNOSIS — H903 Sensorineural hearing loss, bilateral: Secondary | ICD-10-CM | POA: Diagnosis not present

## 2018-06-28 ENCOUNTER — Ambulatory Visit
Admission: RE | Admit: 2018-06-28 | Discharge: 2018-06-28 | Disposition: A | Discharge status: Home / Self Care | Payer: 59 | Source: Ambulatory Visit | Attending: Unknown Physician Specialty | Admitting: Unknown Physician Specialty

## 2018-06-28 DIAGNOSIS — H918X9 Other specified hearing loss, unspecified ear: Secondary | ICD-10-CM

## 2018-06-28 DIAGNOSIS — H9193 Unspecified hearing loss, bilateral: Secondary | ICD-10-CM | POA: Diagnosis not present

## 2018-06-28 DIAGNOSIS — H903 Sensorineural hearing loss, bilateral: Secondary | ICD-10-CM | POA: Diagnosis not present

## 2018-06-28 MED ORDER — GADOBUTROL 1 MMOL/ML IV SOLN
7.5000 mL | Freq: Once | INTRAVENOUS | Status: AC | PRN
  Administered 2018-06-28: 7.5 mL via INTRAVENOUS

## 2018-07-05 ENCOUNTER — Other Ambulatory Visit: Payer: Self-pay | Admitting: Physician Assistant

## 2018-07-13 ENCOUNTER — Other Ambulatory Visit: Payer: Self-pay

## 2018-07-13 ENCOUNTER — Encounter: Payer: Self-pay | Admitting: Psychiatry

## 2018-07-13 ENCOUNTER — Ambulatory Visit: Payer: 59 | Admitting: Psychiatry

## 2018-07-13 VITALS — BP 126/88 | HR 111 | Temp 98.1°F | Wt 164.4 lb

## 2018-07-13 DIAGNOSIS — F330 Major depressive disorder, recurrent, mild: Secondary | ICD-10-CM | POA: Diagnosis not present

## 2018-07-13 MED ORDER — ESCITALOPRAM OXALATE 20 MG PO TABS: 20.0000 mg | ORAL_TABLET | Freq: Every day | ORAL | 2 refills | Status: DC

## 2018-07-13 NOTE — Progress Notes (Signed)
Patient ID: [name], female   DOB: 11-08-1985, 32 y.o.   MRN: [id number]   [facility] MD/PA/NP OP Progress Note  07/13/2018 3:48 PM ANEESA ROMEY  MRN:  [id number]  Subjective:  Patient returns for follow-up of her major depressive disorder. She reports that she has been doing okay. She had to get hearing aids since her hearing loss got worse.  Reports work is the same .[name] She has been taking  Lexapro at 20 mg.Denies any mood or anxiety symptoms. things are better with her husband for now. Sleeping and eating ok. Denies any suicidal thoughts.  Chief Complaint: doing okay Chief Complaint    Follow-up; Medication Refill     Visit Diagnosis:     ICD-10-CM   1. MDD (major depressive disorder), recurrent episode, mild (HCC) F33.0 escitalopram (LEXAPRO) 20 MG tablet    Past Medical History:  Past Medical History:  Diagnosis Date  . Anxiety   . Depression   . Migraines    a. x 10+ years.  . Postpartum care following cesarean delivery (5/29) 02/04/2014  . Pre-syncope    a. 07/2015 [facility] ED visit, ? vasovagal, improved with IVF.  [name] Short cervix in second trimester, antepartum 11/16/2013    Past Surgical History:  Procedure Laterality Date  . CESAREAN SECTION N/A 02/03/2014   Procedure: Primary Cesarean Section Delivery Baby Girl @ 2244, Apgars 9/9;  Surgeon: Serita Kyle, MD;  Location: [facility];  Service: Obstetrics;  Laterality: N/A;  . CESAREAN SECTION    . WISDOM TOOTH EXTRACTION  2002   Family History:  Family History  Problem Relation Age of Onset  . Depression Mother   . Anxiety disorder Mother   . Heart attack Mother        NSTEMI @ age 7.  [name] Alcohol abuse Paternal Grandfather   . Sleep apnea Father        alive @ 32.  [name] Heart attack Maternal Grandfather   . Anxiety disorder Maternal Grandmother   . Depression Maternal Grandmother   . Diabetes Maternal Grandmother   . Hypertension Maternal Grandmother    Social History:  Social History   Socioeconomic  History  . Marital status: Married    Spouse name: Not on file  . Number of children: 1  . Years of education: Not on file  . Highest education level: Not on file  Occupational History  . Not on file  Social Needs  . Financial resource strain: Not hard at all  . Food insecurity:    Worry: Never true    Inability: Never true  . Transportation needs:    Medical: No    Non-medical: No  Tobacco Use  . Smoking status: Never Smoker  . Smokeless tobacco: Never Used  Substance and Sexual Activity  . Alcohol use: Yes    Alcohol/week: 0.0 standard drinks    Comment: maybe two drinks/month.  . Drug use: No  . Sexual activity: Yes    Birth control/protection: Implant  Lifestyle  . Physical activity:    Days per week: 0 days    Minutes per session: 0 min  . Stress: Not on file  Relationships  . Social connections:    Talks on phone: Not on file    Gets together: Not on file    Attends religious service: Not on file    Active member of club or organization: Not on file    Attends meetings of clubs or organizations: Not on file  Relationship status: Married  Other Topics Concern  . Not on file  Social History Narrative   Lives in [place] with her husband and 60 month old dtr.  Does not routinely exercise.       Additional History:   Assessment:   Musculoskeletal: Strength & Muscle Tone: within normal limits Gait & Station: normal Patient leans: N/A  Psychiatric Specialty Exam: Medication Refill   Anxiety  Patient reports no insomnia, nervous/anxious behavior or suicidal ideas.      Review of Systems  Psychiatric/Behavioral: Negative for depression, hallucinations, memory loss, substance abuse and suicidal ideas. The patient is not nervous/anxious and does not have insomnia.        Weight gain over past several months  All other systems reviewed and are negative.     General Appearance: Neat and Well Groomed  Eye Contact:  Good  Speech:  Clear and Coherent and  Normal Rate  Volume:  Normal  Mood:  better  Affect:  Bright smile  Thought Process:  Linear and Logical  Orientation:  Full (Time, Place, and Person)  Thought Content:  Negative  Suicidal Thoughts:  No  Homicidal Thoughts:  No  Memory:  Immediate;   Good Recent;   Good Remote;   Good  Judgement:  Good  Insight:  Good  Psychomotor Activity:  Negative  Concentration:  Good  Recall:  Good  Fund of Knowledge: Good  Language: Good  Akathisia:  Negative  Handed:  Right unknown  AIMS (if indicated):  Not done  Assets:  Communication Skills Desire for Improvement Social Support Vocational/Educational  ADL's:  Intact  Cognition: WNL  Sleep:  Okay    Is the patient at risk to self?  No. Has the patient been a risk to self in the past 6 months?  No. Has the patient been a risk to self within the distant past?  No. Is the patient a risk to others?  No. Has the patient been a risk to others in the past 6 months?  No. Has the patient been a risk to others within the distant past?  No.  Current Medications: Current Outpatient Medications  Medication Sig Dispense Refill  . ALPRAZolam (XANAX) 0.25 MG tablet Take 1 tablet (0.25 mg total) by mouth daily as needed for anxiety. 30 tablet 0  . azithromycin (ZITHROMAX Z-PAK) 250 MG tablet As directed 6 tablet 0  . cyclobenzaprine (FLEXERIL) 10 MG tablet Take 1 tablet (10 mg total) by mouth every 8 (eight) hours as needed for muscle spasms. 30 tablet 1  . eletriptan (RELPAX) 40 MG tablet Take 1 tablet (40 mg total) by mouth as needed for migraine or headache. May repeat in 2 hours if headache persists or recurs. 10 tablet 11  . escitalopram (LEXAPRO) 20 MG tablet Take 1 tablet (20 mg total) by mouth daily. 30 tablet 2  . fluticasone (FLONASE) 50 MCG/ACT nasal spray Place 2 sprays into both nostrils daily. 16 g 0  . Galcanezumab-gnlm (EMGALITY) 120 MG/ML SOAJ Inject 1 pen into the skin every 30 (thirty) days. 1 pen 11  .  HYDROcodone-Acetaminophen 10-300 MG TABS Take 1-2 tablets by mouth every 6 (six) hours as needed. 20 each 0  . levonorgestrel-ethinyl estradiol (KURVELO) 0.15-30 MG-MCG tablet Take 1 tablet by mouth daily. Active pills only 3 Package 5  . naproxen sodium (ANAPROX DS) 550 MG tablet Take 1 tablet (550 mg total) by mouth as needed for headache. 60 tablet 1  . norethindrone (MICRONOR,CAMILA,ERRIN) 0.35 MG tablet  0  . ondansetron (ZOFRAN-ODT) 4 MG disintegrating tablet DISSOLVE 1 TABLET BY MOUTH EVERY 8 (EIGHT) HOURS AS NEEDED FOR NAUSEA OR VOMITING. 20 tablet 1  . promethazine (PHENERGAN) 25 MG suppository Place 1 suppository (25 mg total) rectally every 6 (six) hours as needed for nausea or vomiting. 12 each 0  . promethazine (PHENERGAN) 25 MG tablet Take 1 tablet (25 mg total) by mouth every 8 (eight) hours as needed for nausea or vomiting. 30 tablet 0  . propranolol (INDERAL) 40 MG tablet Take 1 tablet (40 mg total) by mouth daily. 30 tablet 2  . SUMAtriptan (IMITREX) 100 MG tablet Take 1 tablet (100 mg total) by mouth as needed. 12 tablet 11  . SUMAtriptan 6 MG/0.5ML SOAJ Inject 1 cartridge into the skin as needed (migraine). May repeat in 2 hours.  No more than 2 doses of Imitrex per day. 12 Cartridge 3  . valACYclovir (VALTREX) 1000 MG tablet Take 2 tablets (2,000 mg total) by mouth 2 (two) times daily. And may repeat in the future if needed 8 tablet 0  . zolmitriptan (ZOMIG) 5 MG nasal solution Place 1 spray into the nose as needed for migraine. 6 Units 5   No current facility-administered medications for this visit.     Medical Decision Making:  Established Problem, Stable/Improving (1) patient indicates some improvement on medication but wonders whether she could feel even better with a higher dose. she is denying any side effects on the Lexapro.  Treatment Plan Summary:Medication management   Major depressive disorder, recurrent, moderate In remission Continue Lexapro 20 mg and take it  at bedtime Continue therapy with Nolon Rod.  Other anxiety disorder- alprazolam 0.25 mg daily as needed for anxiety/panic.  Patient will follow up in 3 month or call before if needed  .Annison Birchard 07/13/2018, 3:48 PM

## 2018-09-13 ENCOUNTER — Encounter: Payer: Self-pay | Admitting: Family Medicine

## 2018-09-13 ENCOUNTER — Telehealth: Payer: 59 | Admitting: Family Medicine

## 2018-09-13 DIAGNOSIS — B009 Herpesviral infection, unspecified: Secondary | ICD-10-CM | POA: Diagnosis not present

## 2018-09-13 MED ORDER — VALACYCLOVIR HCL 1 G PO TABS: 2000.0000 mg | ORAL_TABLET | Freq: Two times a day (BID) | ORAL | 0 refills | Status: AC

## 2018-09-13 NOTE — Progress Notes (Signed)
We are sorry that you are not feeling well.  Here is how we plan to help!  Based on what you have shared with me it does look like you have a viral infection.    Most cold sores or fever blisters are small fluid filled blisters around the mouth caused by herpes simplex virus.  The most common strain of the virus causing cold sores is herpes simplex virus 1.  It can be spread by skin contact, sharing eating utensils, or even sharing towels.  Cold sores are contagious to other people until dry. (Approximately 5-7 days).  Wash your hands. You can spread the virus to your eyes through handling your contact lenses after touching the lesions.  Most people experience pain at the sight or tingling sensations in their lips that may begin before the ulcers erupt.  Herpes simplex is treatable but not curable.  It may lie dormant for a long time and then reappear due to stress or prolonged sun exposure.  Many patients have success in treating their cold sores with an over the counter topical called Abreva.  You may apply the cream up to 5 times daily (maximum 10 days) until healing occurs.  If you would like to use an oral antiviral medication to speed the healing of your cold sore, I have sent a prescription to your local pharmacy Valacyclovir 2 gm twice daily for 1 day    HOME CARE:   Wash your hands frequently.  Do not pick at or rub the sore.  Don't open the blisters.  Avoid kissing other people during this time.  Avoid sharing drinking glasses, eating utensils, or razors.  Do not handle contact lenses unless you have thoroughly washed your hands with soap and warm water!  Avoid oral sex during this time.  Herpes from sores on your mouth can spread to your partner's genital area.  Avoid contact with anyone who has eczema or a weakened immune system.  Cold sores are often triggered by exposure to intense sunlight, use a lip balm containing a sunscreen (SPF 30 or higher).  GET HELP RIGHT AWAY  IF:   Blisters look infected.  Blisters occur near or in the eye.  Symptoms last longer than 10 days.  Your symptoms become worse.  MAKE SURE YOU:   Understand these instructions.  Will watch your condition.  Will get help right away if you are not doing well or get worse.    Your e-visit answers were reviewed by a board certified advanced clinical practitioner to complete your personal care plan.  Depending upon the condition, your plan could have  Included both over the counter or prescription medications.    Please review your pharmacy choice.  Be sure that the pharmacy you have chosen is open so that you can pick up your prescription now.  If there is a problem you can message your provider in MyChart to have the prescription routed to another pharmacy.    Your safety is important to us.  If you have drug allergies check our prescription carefully.  For the next 24 hours you can use MyChart to ask questions about today's visit, request a non-urgent call back, or ask for a work or school excuse from your e-visit provider.  You will get an email in the next two days asking about your experience.  I hope that your e-visit has been valuable and will speed your recovery.  

## 2018-10-07 ENCOUNTER — Telehealth: Payer: 59 | Admitting: Physician Assistant

## 2018-10-07 DIAGNOSIS — B001 Herpesviral vesicular dermatitis: Secondary | ICD-10-CM | POA: Diagnosis not present

## 2018-10-07 DIAGNOSIS — B009 Herpesviral infection, unspecified: Secondary | ICD-10-CM | POA: Diagnosis not present

## 2018-10-07 MED ORDER — VALACYCLOVIR HCL 1 G PO TABS: 2000.0000 mg | ORAL_TABLET | Freq: Two times a day (BID) | ORAL | 0 refills | Status: DC

## 2018-10-07 NOTE — Progress Notes (Signed)
We are sorry that you are not feeling well.  Here is how we plan to help!  Based on what you have shared with me it does look like you have a viral infection.    Most cold sores or fever blisters are small fluid filled blisters around the mouth caused by herpes simplex virus.  The most common strain of the virus causing cold sores is herpes simplex virus 1.  It can be spread by skin contact, sharing eating utensils, or even sharing towels.  Cold sores are contagious to other people until dry. (Approximately 5-7 days).  Wash your hands. You can spread the virus to your eyes through handling your contact lenses after touching the lesions.  Most people experience pain at the sight or tingling sensations in their lips that may begin before the ulcers erupt.  Herpes simplex is treatable but not curable.  It may lie dormant for a long time and then reappear due to stress or prolonged sun exposure.  Many patients have success in treating their cold sores with an over the counter topical called Abreva.  You may apply the cream up to 5 times daily (maximum 10 days) until healing occurs.  If you would like to use an oral antiviral medication to speed the healing of your cold sore, I have sent a prescription to your local pharmacy Valacyclovir 2 gm twice daily for 1 day    HOME CARE:   Wash your hands frequently.  Do not pick at or rub the sore.  Don't open the blisters.  Avoid kissing other people during this time.  Avoid sharing drinking glasses, eating utensils, or razors.  Do not handle contact lenses unless you have thoroughly washed your hands with soap and warm water!  Avoid oral sex during this time.  Herpes from sores on your mouth can spread to your partner's genital area.  Avoid contact with anyone who has eczema or a weakened immune system.  Cold sores are often triggered by exposure to intense sunlight, use a lip balm containing a sunscreen (SPF 30 or higher).  GET HELP RIGHT AWAY  IF:   Blisters look infected.  Blisters occur near or in the eye.  Symptoms last longer than 10 days.  Your symptoms become worse.  MAKE SURE YOU:   Understand these instructions.  Will watch your condition.  Will get help right away if you are not doing well or get worse.    Your e-visit answers were reviewed by a board certified advanced clinical practitioner to complete your personal care plan.  Depending upon the condition, your plan could have  Included both over the counter or prescription medications.    Please review your pharmacy choice.  Be sure that the pharmacy you have chosen is open so that you can pick up your prescription now.  If there is a problem you can message your provider in MyChart to have the prescription routed to another pharmacy.    Your safety is important to us.  If you have drug allergies check our prescription carefully.  For the next 24 hours you can use MyChart to ask questions about today's visit, request a non-urgent call back, or ask for a work or school excuse from your e-visit provider.  You will get an email in the next two days asking about your experience.  I hope that your e-visit has been valuable and will speed your recovery.  

## 2018-10-26 ENCOUNTER — Telehealth: Payer: 59 | Admitting: Physician Assistant

## 2018-10-26 DIAGNOSIS — B009 Herpesviral infection, unspecified: Secondary | ICD-10-CM | POA: Diagnosis not present

## 2018-10-26 DIAGNOSIS — B001 Herpesviral vesicular dermatitis: Secondary | ICD-10-CM

## 2018-10-26 MED ORDER — VALACYCLOVIR HCL 1 G PO TABS: 2000.0000 mg | ORAL_TABLET | Freq: Two times a day (BID) | ORAL | 0 refills | Status: DC

## 2018-10-26 NOTE — Progress Notes (Signed)
We are sorry that you are not feeling well.  Here is how we plan to help!  Based on what you have shared with me it does look like you have a viral infection.    Most cold sores or fever blisters are small fluid filled blisters around the mouth caused by herpes simplex virus.  The most common strain of the virus causing cold sores is herpes simplex virus 1.  It can be spread by skin contact, sharing eating utensils, or even sharing towels.  Cold sores are contagious to other people until dry. (Approximately 5-7 days).  Wash your hands. You can spread the virus to your eyes through handling your contact lenses after touching the lesions.  Most people experience pain at the sight or tingling sensations in their lips that may begin before the ulcers erupt.  Herpes simplex is treatable but not curable.  It may lie dormant for a long time and then reappear due to stress or prolonged sun exposure.  Many patients have success in treating their cold sores with an over the counter topical called Abreva.  You may apply the cream up to 5 times daily (maximum 10 days) until healing occurs.  If you would like to use an oral antiviral medication to speed the healing of your cold sore, I have sent a prescription to your local pharmacy Valacyclovir 2 gm twice daily for 1 day.  A total of 5-10 minutes was spent evaluating this patients questionnaire and formulating a plan of care.      HOME CARE:  Wash your hands frequently. Do not pick at or rub the sore. Don't open the blisters. Avoid kissing other people during this time. Avoid sharing drinking glasses, eating utensils, or razors. Do not handle contact lenses unless you have thoroughly washed your hands with soap and warm water! Avoid oral sex during this time.  Herpes from sores on your mouth can spread to your partner's genital area. Avoid contact with anyone who has eczema or a weakened immune system. Cold sores are often triggered by exposure to  intense sunlight, use a lip balm containing a sunscreen (SPF 30 or higher).  GET HELP RIGHT AWAY IF:  Blisters look infected. Blisters occur near or in the eye. Symptoms last longer than 10 days. Your symptoms become worse.  MAKE SURE YOU:  Understand these instructions. Will watch your condition. Will get help right away if you are not doing well or get worse.    Your e-visit answers were reviewed by a board certified advanced clinical practitioner to complete your personal care plan.  Depending upon the condition, your plan could have  Included both over the counter or prescription medications.    Please review your pharmacy choice.  Be sure that the pharmacy you have chosen is open so that you can pick up your prescription now.  If there is a problem you can message your provider in MyChart to have the prescription routed to another pharmacy.    Your safety is important to [place].  If you have drug allergies check our prescription carefully.  For the next 24 hours you can use MyChart to ask questions about today's visit, request a non-urgent call back, or ask for a work or school excuse from your e-visit provider.  You will get an email in the next two days asking about your experience.  I hope that your e-visit has been valuable and will speed your recovery.  ===View-only below this line===   ----- Message -----  From: Candice [name]    Sent: 10/26/2018 10:05 AM EST      To: E-Visit Mailing List Subject: E-Visit Submission: Candice [name] Sores  E-Visit Submission: Candice [name] Sores --------------------------------  Question: What is the location of your cold sore? Answer:   Upper lip  Question: How long have you had this episode? Answer:   Less than 24 hours  Question: Is this your first cold sore? Answer:   No  Question: How many outbreaks a year do your normally experience? Answer:   3-4  Question: Have you tried and failed over the counter medications? Answer:    No  Question: Are you or do you think you might be pregnant? Answer:   No  Question: Are you breastfeeding? Answer:   No  Question: Have you ever been told that you are allergic to acyclovir or valacyclovir? Answer:   No  Question: Are you able to attach a photo of your cold sore? Answer:   No  Question: Please list your medication allergies that you may have ? (If 'none' , please list as 'none') Answer:   Sulfa  Question: Is there anything else you would like to share with your provider? Answer:   Requesting rx for valtrex

## 2018-11-05 ENCOUNTER — Encounter: Payer: Self-pay | Admitting: Physician Assistant

## 2018-11-05 ENCOUNTER — Ambulatory Visit: Payer: 59 | Admitting: Physician Assistant

## 2018-11-05 VITALS — BP 129/90 | HR 103 | Wt 161.0 lb

## 2018-11-05 DIAGNOSIS — G43001 Migraine without aura, not intractable, with status migrainosus: Secondary | ICD-10-CM | POA: Diagnosis not present

## 2018-11-05 DIAGNOSIS — M62838 Other muscle spasm: Secondary | ICD-10-CM

## 2018-11-05 DIAGNOSIS — G43009 Migraine without aura, not intractable, without status migrainosus: Secondary | ICD-10-CM | POA: Diagnosis not present

## 2018-11-05 MED ORDER — IBUPROFEN 800 MG PO TABS: 800.0000 mg | ORAL_TABLET | Freq: Three times a day (TID) | ORAL | 1 refills | Status: DC | PRN

## 2018-11-05 MED ORDER — CYCLOBENZAPRINE HCL 10 MG PO TABS: 10.0000 mg | ORAL_TABLET | Freq: Three times a day (TID) | ORAL | 1 refills | Status: DC | PRN

## 2018-11-05 MED ORDER — ONDANSETRON 4 MG PO TBDP: ORAL_TABLET | ORAL | 1 refills | Status: DC

## 2018-11-05 MED ORDER — MECLIZINE HCL 12.5 MG PO TABS: 12.5000 mg | ORAL_TABLET | Freq: Three times a day (TID) | ORAL | 0 refills | Status: DC | PRN

## 2018-11-05 MED ORDER — HYDROCODONE-ACETAMINOPHEN 10-300 MG PO TABS: 1.0000 | ORAL_TABLET | Freq: Four times a day (QID) | ORAL | 0 refills | Status: DC | PRN

## 2018-11-05 MED ORDER — METOPROLOL TARTRATE 25 MG PO TABS: 50.0000 mg | ORAL_TABLET | Freq: Two times a day (BID) | ORAL | 3 refills | Status: DC

## 2018-11-05 NOTE — Progress Notes (Signed)
History:  Candice [name] is a 33 y.o. G1P1001 who presents to clinic today for headache followup. She has had extensive workup in last 6 months related to ENT.  She had significant decline in her hearing.  She had MRI brain which was normal and now wears hearing aides.  She continues to have intermittent dizziness.   Emgality was not helpful and has been discontinued without increase in symptoms. It was used for 8 months without benefit.  Flexeril has been used more often and is helpful without causing dizziness.  Moderate headaches may have decreased in frequency.  This may be related to using OCP (in place of POP) and active pills only.  She feels this has been a good change for her.   Today she has a bad headache and dizziness.  She has more dizziness in general  - not sure if related to ENT issues.  She is having days of ongoing headaches and not seeing much improvement.  Today she is on day 5 of HA. Zomig NS was trialed once and no benefit.  She plans to try once more.     Past Medical History:  Diagnosis Date  . Anxiety   . Depression   . Hearing loss   . Migraines    a. x 10+ years.  . Postpartum care following cesarean delivery (5/29) 02/04/2014  . Pre-syncope    a. 07/2015 [facility] visit, ? vasovagal, improved with IVF.  [name] Short cervix in second trimester, antepartum 11/16/2013    Social History   Socioeconomic History  . Marital status: Married    Spouse name: Not on file  . Number of children: 1  . Years of education: Not on file  . Highest education level: Not on file  Occupational History  . Not on file  Social Needs  . Financial resource strain: Not hard at all  . Food insecurity:    Worry: Never true    Inability: Never true  . Transportation needs:    Medical: No    Non-medical: No  Tobacco Use  . Smoking status: Never Smoker  . Smokeless tobacco: Never Used  Substance and Sexual Activity  . Alcohol use: Yes    Alcohol/week: 0.0 standard drinks     Comment: maybe two drinks/month.  . Drug use: No  . Sexual activity: Yes    Birth control/protection: Implant  Lifestyle  . Physical activity:    Days per week: 0 days    Minutes per session: 0 min  . Stress: Not on file  Relationships  . Social connections:    Talks on phone: Not on file    Gets together: Not on file    Attends religious service: Not on file    Active member of club or organization: Not on file    Attends meetings of clubs or organizations: Not on file    Relationship status: Married  . Intimate partner violence:    Fear of current or ex partner: Not on file    Emotionally abused: Not on file    Physically abused: Not on file    Forced sexual activity: Not on file  Other Topics Concern  . Not on file  Social History Narrative   Lives in [place] with her husband and 22 month old dtr.  Does not routinely exercise.        Family History  Problem Relation Age of Onset  . Depression Mother   . Anxiety disorder Mother   .  Heart attack Mother        NSTEMI @ age 31.  [name] Alcohol abuse Paternal Grandfather   . Sleep apnea Father        alive @ 63.  [name] Heart attack Maternal Grandfather   . Anxiety disorder Maternal Grandmother   . Depression Maternal Grandmother   . Diabetes Maternal Grandmother   . Hypertension Maternal Grandmother     Allergies  Allergen Reactions  . Sulfa Antibiotics Other (See Comments)  . Sulfur Hives    Current Outpatient Medications on File Prior to Visit  Medication Sig Dispense Refill  . ALPRAZolam (XANAX) 0.25 MG tablet Take 1 tablet (0.25 mg total) by mouth daily as needed for anxiety. 30 tablet 0  . cyclobenzaprine (FLEXERIL) 10 MG tablet Take 1 tablet (10 mg total) by mouth every 8 (eight) hours as needed for muscle spasms. 30 tablet 1  . eletriptan (RELPAX) 40 MG tablet TAKE 1 TABLET BY MOUTH AS NEEDED FOR MIGRAINE OR HEADACHE. MAY REPEAT IN 2 HOURS IF HEADACHE PERSISTS OR RECURS. 10 tablet 11  . escitalopram (LEXAPRO) 20  MG tablet Take 1 tablet (20 mg total) by mouth daily. 30 tablet 2  . HYDROcodone-Acetaminophen 10-300 MG TABS Take 1-2 tablets by mouth every 6 (six) hours as needed. 20 each 0  . levonorgestrel-ethinyl estradiol (KURVELO) 0.15-30 MG-MCG tablet Take 1 tablet by mouth daily. Active pills only 3 Package 5  . naproxen sodium (ANAPROX DS) 550 MG tablet Take 1 tablet (550 mg total) by mouth as needed for headache. 60 tablet 1  . ondansetron (ZOFRAN-ODT) 4 MG disintegrating tablet DISSOLVE 1 TABLET BY MOUTH EVERY 8 (EIGHT) HOURS AS NEEDED FOR NAUSEA OR VOMITING. 20 tablet 1  . promethazine (PHENERGAN) 25 MG suppository Place 1 suppository (25 mg total) rectally every 6 (six) hours as needed for nausea or vomiting. 12 each 0  . promethazine (PHENERGAN) 25 MG tablet Take 1 tablet (25 mg total) by mouth every 8 (eight) hours as needed for nausea or vomiting. 30 tablet 0  . SUMAtriptan 6 MG/0.5ML SOAJ Inject 1 cartridge into the skin as needed (migraine). May repeat in 2 hours.  No more than 2 doses of Imitrex per day. 12 Cartridge 3  . valACYclovir (VALTREX) 1000 MG tablet Take 2 tablets (2,000 mg total) by mouth 2 (two) times daily. 4 tablet 0  . zolmitriptan (ZOMIG) 5 MG nasal solution Place 1 spray into the nose as needed for migraine. 6 Units 5  . azithromycin (ZITHROMAX Z-PAK) 250 MG tablet As directed (Patient not taking: Reported on 11/05/2018) 6 tablet 0  . fluticasone (FLONASE) 50 MCG/ACT nasal spray Place 2 sprays into both nostrils daily. (Patient not taking: Reported on 11/05/2018) 16 g 0  . norethindrone (MICRONOR,CAMILA,ERRIN) 0.35 MG tablet   0  . propranolol (INDERAL) 40 MG tablet Take 1 tablet (40 mg total) by mouth daily. (Patient not taking: Reported on 11/05/2018) 30 tablet 2  . SUMAtriptan (IMITREX) 100 MG tablet Take 1 tablet (100 mg total) by mouth as needed. (Patient not taking: Reported on 11/05/2018) 12 tablet 11   No current facility-administered medications on file prior to visit.       Review of Systems:  All pertinent positive/negative included in HPI, all other review of systems are negative   Objective:  Physical Exam BP 129/90   Pulse (!) 103   Wt 161 lb (73 kg)   BMI 27.64 kg/m  CONSTITUTIONAL: Well-developed, well-nourished female in no acute distress.  EYES: EOM intact  ENT: Normocephalic CARDIOVASCULAR: Regular rate  RESPIRATORY: Normal rate .  MUSCULOSKELETAL: Normal ROM, strength equal bilaterally,  significant muscle spasm noted in neck and trapezius muscles bilaterally SKIN: Warm, dry without erythema  NEUROLOGICAL: Alert, oriented, CN II-XII grossly intact, Appropriate balance   PSYCH: Normal behavior, mood  PROCEDURE:  TRIGGER POINT INJECTIONS  Procedure: Mixture of 1%  Lidocaine, marcaine and dexamethazone in a ratio of 2:2:1  injected with 1cc each site in bilateral cervical paraspinal muscles, bilateral trapezius.  Total amount injected: 8cc.  Pt tolerated procedure well.    Assessment & Plan:  Assessment: 1. Migraine without aura and with status migrainosus, not intractable   2. Muscle spasm   3. Migraine without aura and without status migrainosus, not intractable    Worsening of these issues  Plan: TPI today for status migraine Begin Metoprolol 25mg  and titrate to 50mg  daily for migraine prevention Trial Zomig for one additional HA.  If no benefit, go back to using Relpax acutely.  Flexeril PRN as long as no exacerbation of dizziness Meclizine PRN dizziness/HA - discuss with ENT Rescue with Hydrocodone Ibuprofen with triptan for improved efficacy Continue OCP continuously  Zofran for nausea with migraine Follow-up in 3 months or sooner PRN  Bertram Denver, PA-C 11/05/2018 9:05 AM

## 2018-11-05 NOTE — Patient Instructions (Signed)

## 2018-12-12 IMAGING — MR MR BRAIN/IAC WO/W
10 of 13 series · 26 of 48 positions shown · IV contrast (gadavist)
Comparison: Head CT without contrast 07/12/2015.

CLINICAL DATA: 31-year-old female with progressive bilateral
hearing loss, worse on the left.

EXAM:
MRI HEAD WITHOUT AND WITH CONTRAST
TECHNIQUE: Multiplanar, multiecho pulse sequences of the brain and surrounding
structures were obtained without and with intravenous contrast.
CONTRAST:  Gadavist 7.5 milliliters

[Series 5: T1 · sagittal · 5.0mm · 0.62mm/px · 2 of 21 slices shown (1 of 3)]
[im 1/21]
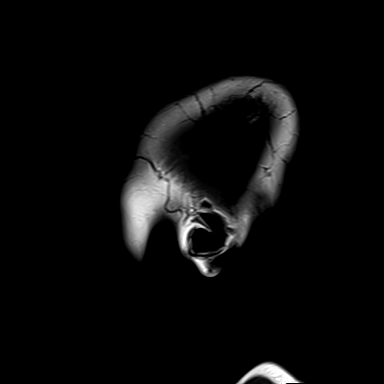
[im 21/21]
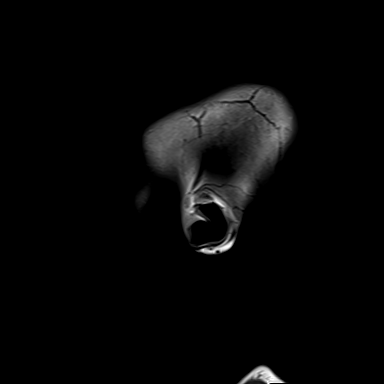

[Series 6: ax dwi_tracew · axial · 3.0mm · 0.73mm/px · z∈[-59,+103]mm · 4 of 55 slices shown]
[im 1/55]
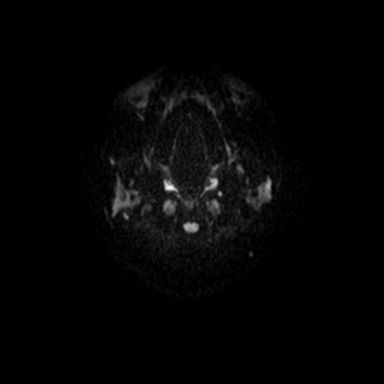
[im 19/55]
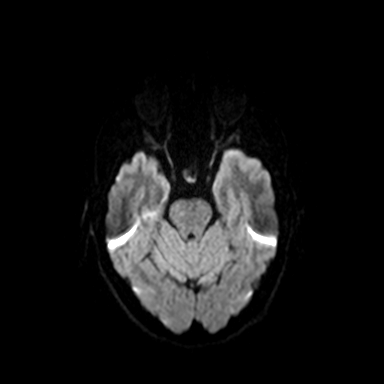
[im 37/55]
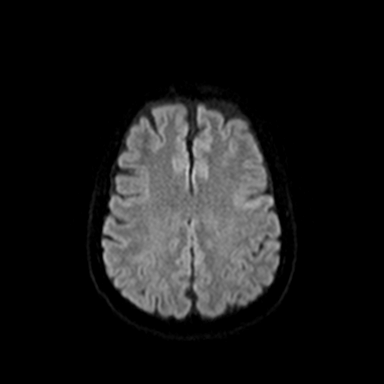
[im 55/55]
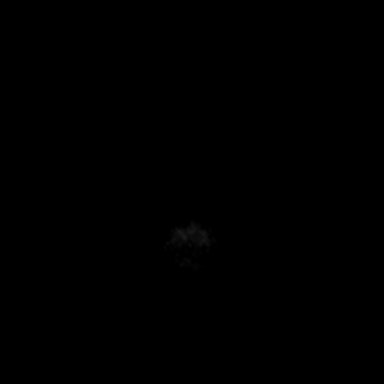

[Series 7: ax dwi_adc · axial · 3.0mm · 0.73mm/px · z∈[-59,-5]mm · 2 of 55 slices shown]
[im 1/55]
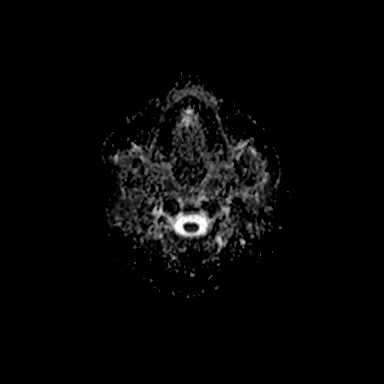
[im 19/55]
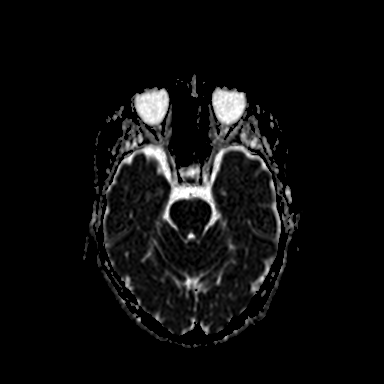

[Series 8: T2 · axial · 5.0mm · 0.53mm/px · z∈[-52,+103]mm · 2 of 27 slices shown]
[im 1/27]
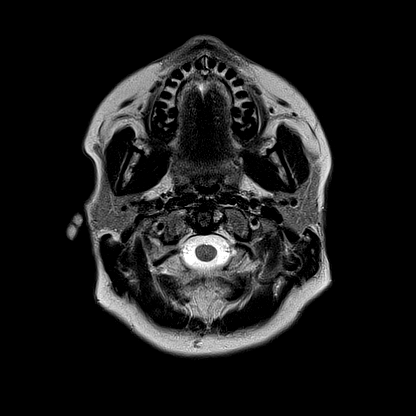
[im 27/27]
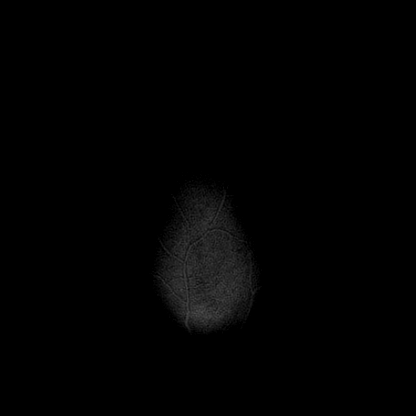

[Series 11: FLAIR · axial · 3.0mm · 0.53mm/px · z∈[-55,+106]mm · 4 of 55 slices shown]
[im 1/55]
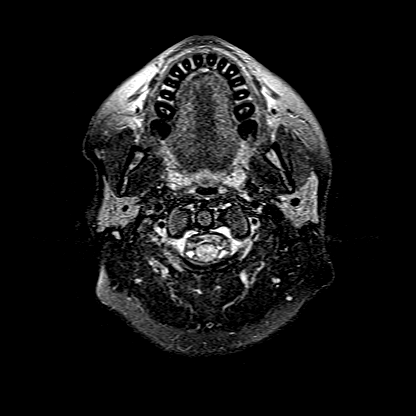
[im 19/55]
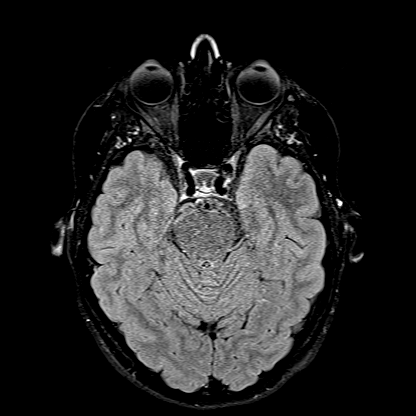
[im 37/55]
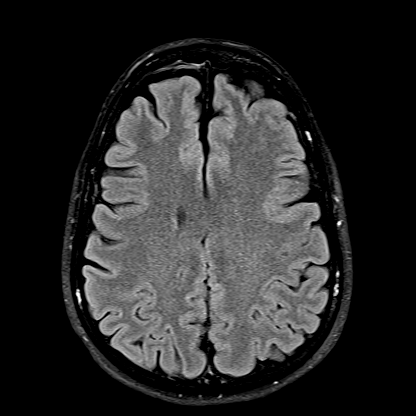
[im 55/55]
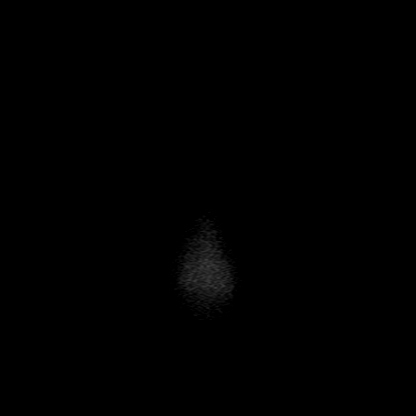

[Series 13: T1 · axial · non-contrast · 3.0mm · 0.21mm/px · 1 of 15 slices shown (2 of 3)]
[im 1/15]
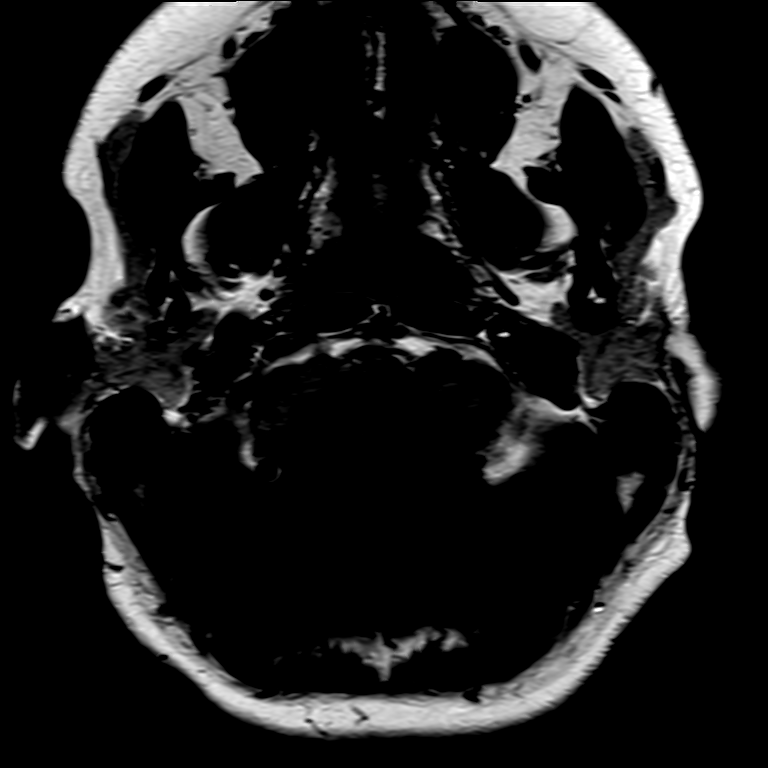

[Series 14: T1 · coronal · non-contrast · 3.0mm · 0.21mm/px · 1 of 13 slices shown (3 of 3)]
[im 1/13]
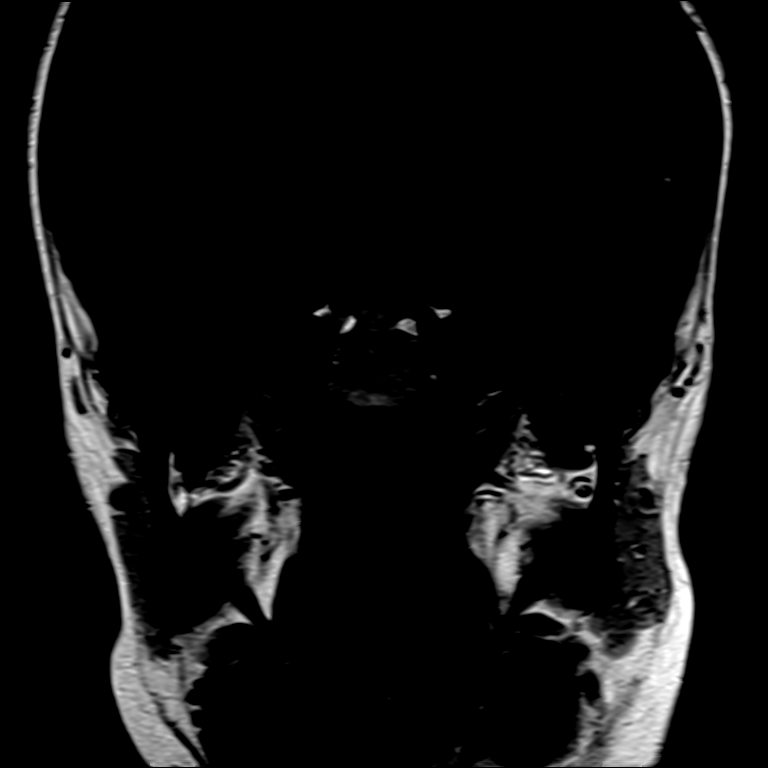

[Series 15: T1 post-contrast · axial · 3.0mm · 0.21mm/px · 1 of 15 slices shown (1 of 3)]
[im 1/15]
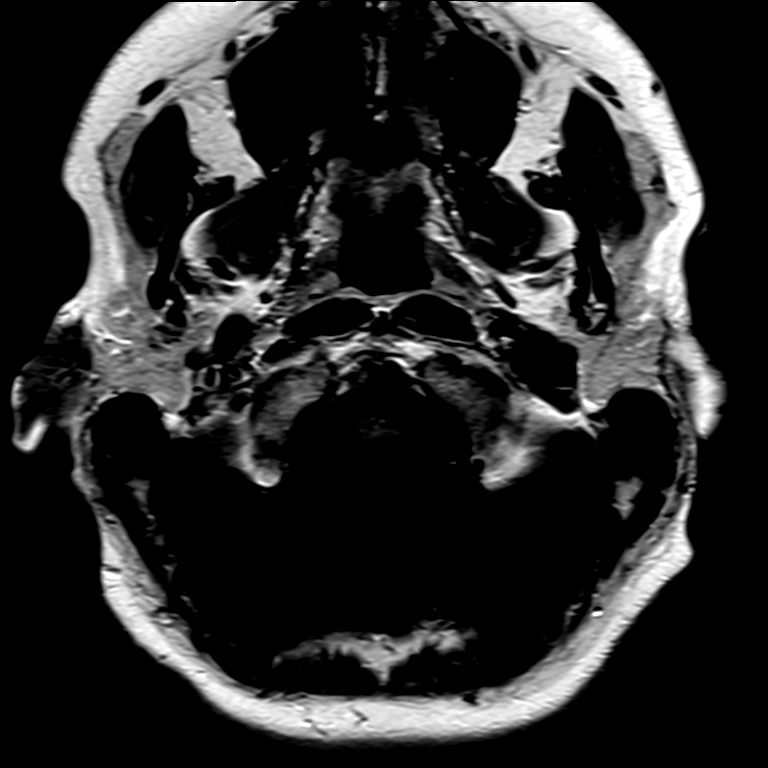

[Series 16: T1 post-contrast · coronal · 3.0mm · 0.21mm/px · 1 of 13 slices shown (2 of 3)]
[im 1/13]
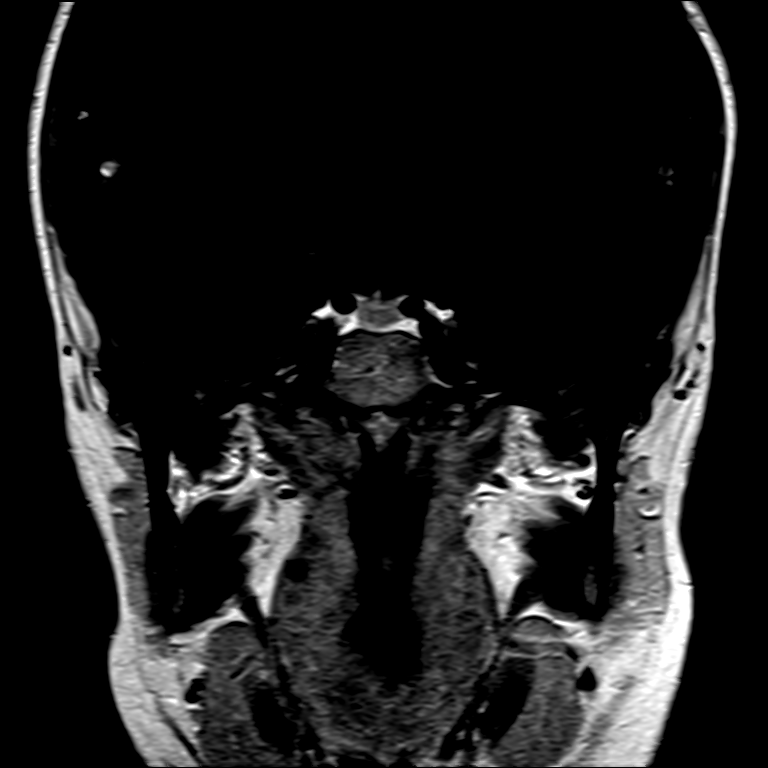

[Series 17: T1 post-contrast · axial · 1.0mm · 0.98mm/px · z∈[-63,+111]mm · 8 of 175 slices shown (3 of 3)]
[im 1/175]
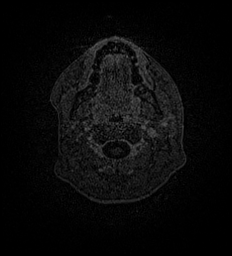
[im 27/175]
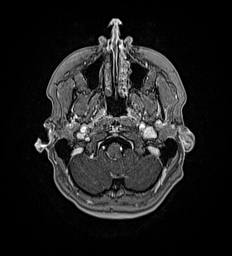
[im 54/175]
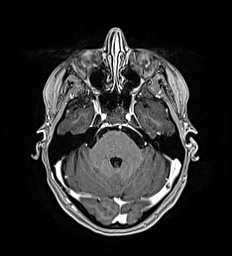
[im 81/175]
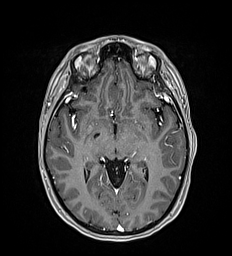
[im 94/175]
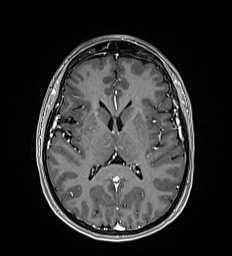
[im 121/175]
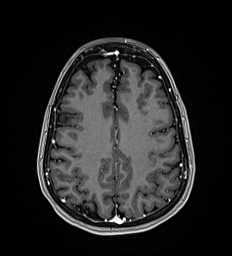
[im 148/175]
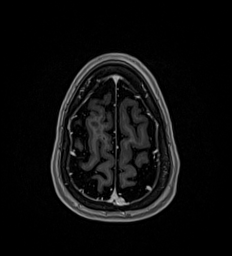
[im 175/175]
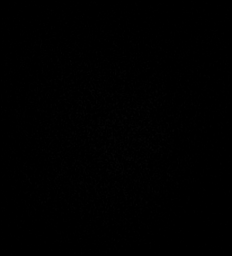

[26 of 48 positions shown; findings below may reference images not displayed]

FINDINGS: Brain: Stable cerebral volume since 8417. No restricted diffusion to
suggest acute infarction. No midline shift, mass effect, evidence of
mass lesion, ventriculomegaly, extra-axial collection or acute
intracranial hemorrhage. Cervicomedullary junction and pituitary are
within normal limits.

Gray and white matter signal is within normal limits throughout the
brain. No chronic cerebral blood products. No abnormal enhancement
identified. No dural thickening.

Vascular: Major intracranial vascular flow voids are preserved.
There is mild generalized intracranial artery tortuosity.

Skull and upper cervical spine: Negative visible cervical spine.
Visualized bone marrow signal is within normal limits.

Sinuses/Orbits: Orbit soft tissues are within normal limits.
Paranasal sinuses are clear. Scalp and face soft tissues appear
negative.

Other: Dedicated internal auditory canal imaging. Normal
cerebellopontine angles. Normal bilateral cisternal and
intracanalicular 7th and 8th cranial nerve segments. Symmetric and
normal appearing T2 signal in the bilateral cochlea and vestibular
structures. Mastoids are clear. No abnormal enhancement identified.
Normal stylomastoid foramina. No skull base abnormality. Bilateral
parotid glands appear normal.
IMPRESSION: Normal bilateral internal auditory imaging and negative MRI
appearance of the brain.

## 2018-12-17 ENCOUNTER — Telehealth: Payer: Self-pay

## 2018-12-17 DIAGNOSIS — F330 Major depressive disorder, recurrent, mild: Secondary | ICD-10-CM

## 2018-12-17 NOTE — Telephone Encounter (Signed)
pt needs enough medication sent in until her next appt. pt was a dr. [name] patient .

## 2018-12-20 MED ORDER — ESCITALOPRAM OXALATE 20 MG PO TABS: 20.0000 mg | ORAL_TABLET | Freq: Every day | ORAL | 2 refills | Status: DC

## 2018-12-20 MED ORDER — ALPRAZOLAM 0.25 MG PO TABS: 0.2500 mg | ORAL_TABLET | Freq: Every day | ORAL | 0 refills | Status: DC | PRN

## 2018-12-20 NOTE — Telephone Encounter (Signed)
Sent meds to pharmacy  

## 2019-01-10 ENCOUNTER — Ambulatory Visit (INDEPENDENT_AMBULATORY_CARE_PROVIDER_SITE_OTHER): Payer: 59 | Admitting: Psychiatry

## 2019-01-10 ENCOUNTER — Other Ambulatory Visit: Payer: Self-pay

## 2019-01-10 ENCOUNTER — Encounter: Payer: Self-pay | Admitting: Psychiatry

## 2019-01-10 DIAGNOSIS — F3342 Major depressive disorder, recurrent, in full remission: Secondary | ICD-10-CM | POA: Diagnosis not present

## 2019-01-10 DIAGNOSIS — F410 Panic disorder [episodic paroxysmal anxiety] without agoraphobia: Secondary | ICD-10-CM

## 2019-01-10 NOTE — Progress Notes (Signed)
Virtual Visit via Video Note  I connected with Candice [name] on 01/10/19 at 11:30 AM EDT by a video enabled telemedicine application and verified that I am speaking with the correct person using two identifiers.   I discussed the limitations of evaluation and management by telemedicine and the availability of in person appointments. The patient expressed understanding and agreed to proceed.    I discussed the assessment and treatment plan with the patient. The patient was provided an opportunity to ask questions and all were answered. The patient agreed with the plan and demonstrated an understanding of the instructions.   The patient was advised to call back or seek an in-person evaluation if the symptoms worsen or if the condition fails to improve as anticipated.  [facility] MD OP Progress Note  01/10/2019 12:19 PM [name]  MRN:  [id number]  Chief Complaint:  Chief Complaint    Follow-up     HPI: Candice [name] is a 33 year old Caucasian female, employed, lives in [place], has a history of depression, anxiety, migraine headaches, bilateral hearing loss, was evaluated by telemedicine today.  Patient used to follow-up with Dr. Daleen Bo previously.  This is her first visit with Clinical research associate.  I have reviewed previous medical records in [facility] per Dr. Daleen Bo the most recent one dated 07/13/2018-' at that time was continued on Lexapro 20 mg as well as Xanax 0.25 mg as needed.'  Patient today reports she is currently doing well on the Lexapro.  She does have some situational stressors.  She reports she continues to struggle with her relationship with her husband.  Her husband is on medications for mood.  She reports he is not very compliant and when he is not he can go into a downward spiral which create a lot of tension.  She reports she is coping okay so far.  Patient currently works as a Sports coach and reports work is going well.  Patient denies any suicidality, homicidality or perceptual  disturbances.  Patient reports sleep is okay.  Patient denies any other concerns today. Visit Diagnosis:    ICD-10-CM   1. MDD (major depressive disorder), recurrent, in full remission (HCC) F33.42   2. Panic attacks F41.0     Past Psychiatric History: Used to be under the care of Dr. Daleen Bo previously.  Patient denies inpatient mental health admissions.  Patient denies suicide attempts  Past Medical History:  Past Medical History:  Diagnosis Date  . Anxiety   . Depression   . Hearing loss   . Migraines    a. x 10+ years.  . Postpartum care following cesarean delivery (5/29) 02/04/2014  . Pre-syncope    a. 07/2015 [facility] ED visit, ? vasovagal, improved with IVF.  [name] Short cervix in second trimester, antepartum 11/16/2013    Past Surgical History:  Procedure Laterality Date  . CESAREAN SECTION N/A 02/03/2014   Procedure: Primary Cesarean Section Delivery Baby Girl @ 2244, Apgars 9/9;  Surgeon: Serita Kyle, MD;  Location: [facility];  Service: Obstetrics;  Laterality: N/A;  . CESAREAN SECTION    . WISDOM TOOTH EXTRACTION  2002    Family Psychiatric History: Maternal grandmother-mental illness.  Family History:  Family History  Problem Relation Age of Onset  . Depression Mother   . Anxiety disorder Mother   . Heart attack Mother        NSTEMI @ age 74.  [name] Alcohol abuse Paternal Grandfather   . Sleep apnea Father  alive @ 66.  [name] Heart attack Maternal Grandfather   . Anxiety disorder Maternal Grandmother   . Depression Maternal Grandmother   . Diabetes Maternal Grandmother   . Hypertension Maternal Grandmother     Social History: She is married.  She has a 79-year-old daughter.  She lives in [name].  She is employed.  She reports she had a good childhood. Social History   Socioeconomic History  . Marital status: Married    Spouse name: Not on file  . Number of children: 1  . Years of education: Not on file  . Highest education level: Not on file   Occupational History  . Not on file  Social Needs  . Financial resource strain: Not hard at all  . Food insecurity:    Worry: Never true    Inability: Never true  . Transportation needs:    Medical: No    Non-medical: No  Tobacco Use  . Smoking status: Never Smoker  . Smokeless tobacco: Never Used  Substance and Sexual Activity  . Alcohol use: Yes    Alcohol/week: 0.0 standard drinks    Comment: maybe two drinks/month.  . Drug use: No  . Sexual activity: Yes    Birth control/protection: Implant  Lifestyle  . Physical activity:    Days per week: 0 days    Minutes per session: 0 min  . Stress: Not on file  Relationships  . Social connections:    Talks on phone: Not on file    Gets together: Not on file    Attends religious service: Not on file    Active member of club or organization: Not on file    Attends meetings of clubs or organizations: Not on file    Relationship status: Married  Other Topics Concern  . Not on file  Social History Narrative   Lives in [place] with her husband and 60 month old dtr.  Does not routinely exercise.        Allergies:  Allergies  Allergen Reactions  . Sulfa Antibiotics Other (See Comments)  . Sulfur Hives    Metabolic Disorder Labs: No results found for: HGBA1C, MPG No results found for: PROLACTIN No results found for: CHOL, TRIG, HDL, CHOLHDL, VLDL, LDLCALC Lab Results  Component Value Date   TSH 0.696 07/12/2015    Therapeutic Level Labs: No results found for: LITHIUM No results found for: VALPROATE No components found for:  CBMZ  Current Medications: Current Outpatient Medications  Medication Sig Dispense Refill  . ALPRAZolam (XANAX) 0.25 MG tablet Take 1 tablet (0.25 mg total) by mouth daily as needed for anxiety. 12 tablet 0  . azithromycin (ZITHROMAX Z-PAK) 250 MG tablet As directed (Patient not taking: Reported on 11/05/2018) 6 tablet 0  . cyclobenzaprine (FLEXERIL) 10 MG tablet Take 1 tablet (10 mg total) by  mouth every 8 (eight) hours as needed for muscle spasms. 30 tablet 1  . eletriptan (RELPAX) 40 MG tablet TAKE 1 TABLET BY MOUTH AS NEEDED FOR MIGRAINE OR HEADACHE. MAY REPEAT IN 2 HOURS IF HEADACHE PERSISTS OR RECURS. 10 tablet 11  . escitalopram (LEXAPRO) 20 MG tablet Take 1 tablet (20 mg total) by mouth daily. 30 tablet 2  . fluticasone (FLONASE) 50 MCG/ACT nasal spray Place 2 sprays into both nostrils daily. (Patient not taking: Reported on 11/05/2018) 16 g 0  . HYDROcodone-Acetaminophen 10-300 MG TABS Take 1-2 tablets by mouth every 6 (six) hours as needed. 20 each 0  . ibuprofen (ADVIL,MOTRIN) 800 MG tablet  Take 1 tablet (800 mg total) by mouth every 8 (eight) hours as needed. 60 tablet 1  . levonorgestrel-ethinyl estradiol (KURVELO) 0.15-30 MG-MCG tablet Take 1 tablet by mouth daily. Active pills only 3 Package 5  . meclizine (ANTIVERT) 12.5 MG tablet Take 1 tablet (12.5 mg total) by mouth 3 (three) times daily as needed for dizziness. 30 tablet 0  . metoprolol tartrate (LOPRESSOR) 25 MG tablet Take 2 tablets (50 mg total) by mouth 2 (two) times daily. 60 tablet 3  . naproxen sodium (ANAPROX DS) 550 MG tablet Take 1 tablet (550 mg total) by mouth as needed for headache. 60 tablet 1  . norethindrone (MICRONOR,CAMILA,ERRIN) 0.35 MG tablet   0  . ondansetron (ZOFRAN-ODT) 4 MG disintegrating tablet DISSOLVE 1 TABLET BY MOUTH EVERY 8 (EIGHT) HOURS AS NEEDED FOR NAUSEA OR VOMITING. 20 tablet 1  . promethazine (PHENERGAN) 25 MG suppository Place 1 suppository (25 mg total) rectally every 6 (six) hours as needed for nausea or vomiting. 12 each 0  . promethazine (PHENERGAN) 25 MG tablet Take 1 tablet (25 mg total) by mouth every 8 (eight) hours as needed for nausea or vomiting. 30 tablet 0  . SUMAtriptan (IMITREX) 100 MG tablet Take 1 tablet (100 mg total) by mouth as needed. (Patient not taking: Reported on 11/05/2018) 12 tablet 11  . SUMAtriptan 6 MG/0.5ML SOAJ Inject 1 cartridge into the skin as needed  (migraine). May repeat in 2 hours.  No more than 2 doses of Imitrex per day. 12 Cartridge 3  . valACYclovir (VALTREX) 1000 MG tablet Take 2 tablets (2,000 mg total) by mouth 2 (two) times daily. 4 tablet 0  . zolmitriptan (ZOMIG) 5 MG nasal solution Place 1 spray into the nose as needed for migraine. 6 Units 5   No current facility-administered medications for this visit.      Musculoskeletal: Strength & Muscle Tone: UTA Gait & Station: UTA Patient leans: N/A  Psychiatric Specialty Exam: Review of Systems  Psychiatric/Behavioral: The patient is nervous/anxious.   All other systems reviewed and are negative.   not currently breastfeeding.There is no height or weight on file to calculate BMI.  General Appearance: Casual  Eye Contact:  Fair  Speech:  Clear and Coherent  Volume:  Normal  Mood:  Anxious  Affect:  Congruent  Thought Process:  Goal Directed and Descriptions of Associations: Intact  Orientation:  Full (Time, Place, and Person)  Thought Content: Logical   Suicidal Thoughts:  No  Homicidal Thoughts:  No  Memory:  Immediate;   Fair Recent;   Fair Remote;   Fair  Judgement:  Fair  Insight:  Fair  Psychomotor Activity:  Normal  Concentration:  Concentration: Fair and Attention Span: Fair  Recall:  [employer]  Fund of Knowledge: Fair  Language: Fair  Akathisia:  No  Handed:  Right  AIMS (if indicated): Denies tremors, rigidity,stiffness  Assets:  Communication Skills Desire for Improvement Resilience Social Support  ADL's:  Intact  Cognition: WNL  Sleep:  Fair   Screenings:   Assessment and Plan: Candice [name] is a 33 year old Caucasian female, employed, married, currently lives in [place], has a history of depression, anxiety.  Patient is currently doing well on the current medication regimen.  Plan MDD in remission Lexapro 20 mg p.o. daily Patient has been noncompliant with therapy visits.  Discussed with patient to reach out to therapist.  Also discussed  psychology today information.   For Anxiety attacks- stable Alprazolam 0.25 mg daily PRN for anxiety attacks Patient  is aware about the risk of being on benzodiazepine therapy.  She has been limiting use.  Reviewed medical records in [facility] per Dr. Daleen Bo as summarized above.  Follow-up in clinic in 2 to 3 months or sooner if needed.  I have spent atleast 15 minutes non face to face with patient today. More than 50 % of the time was spent for psychoeducation and supportive psychotherapy and care coordination.  This note was generated in part or whole with voice recognition software. Voice recognition is usually quite accurate but there are transcription errors that can and very often do occur. I apologize for any typographical errors that were not detected and corrected.        Jomarie Longs, MD 01/10/2019, 12:19 PM

## 2019-03-04 ENCOUNTER — Other Ambulatory Visit: Payer: Self-pay | Admitting: Physician Assistant

## 2019-03-30 ENCOUNTER — Other Ambulatory Visit: Payer: Self-pay

## 2019-03-30 MED ORDER — LEVONORGESTREL-ETHINYL ESTRAD 0.15-30 MG-MCG PO TABS: 1.0000 | ORAL_TABLET | Freq: Every day | ORAL | 11 refills | Status: DC

## 2019-04-01 ENCOUNTER — Encounter: Payer: Self-pay | Admitting: *Deleted

## 2019-04-01 ENCOUNTER — Other Ambulatory Visit: Payer: Self-pay | Admitting: Physician Assistant

## 2019-04-01 MED ORDER — HYDROCODONE-ACETAMINOPHEN 10-300 MG PO TABS: 1.0000 | ORAL_TABLET | Freq: Four times a day (QID) | ORAL | 0 refills | Status: DC | PRN

## 2019-04-04 ENCOUNTER — Encounter: Payer: Self-pay | Admitting: *Deleted

## 2019-04-07 ENCOUNTER — Encounter: Payer: Self-pay | Admitting: *Deleted

## 2019-04-07 ENCOUNTER — Other Ambulatory Visit: Payer: Self-pay | Admitting: Physician Assistant

## 2019-04-07 MED ORDER — HYDROCODONE-ACETAMINOPHEN 10-300 MG PO TABS: 1.0000 | ORAL_TABLET | Freq: Four times a day (QID) | ORAL | 0 refills | Status: DC | PRN

## 2019-04-07 NOTE — Progress Notes (Signed)
Prior rx did not transmit.  New rx sent.

## 2019-04-08 ENCOUNTER — Other Ambulatory Visit: Payer: Self-pay | Admitting: Physician Assistant

## 2019-04-08 ENCOUNTER — Ambulatory Visit (INDEPENDENT_AMBULATORY_CARE_PROVIDER_SITE_OTHER): Payer: 59 | Admitting: Psychiatry

## 2019-04-08 ENCOUNTER — Other Ambulatory Visit: Payer: Self-pay

## 2019-04-08 ENCOUNTER — Encounter: Payer: Self-pay | Admitting: Psychiatry

## 2019-04-08 DIAGNOSIS — F410 Panic disorder [episodic paroxysmal anxiety] without agoraphobia: Secondary | ICD-10-CM | POA: Insufficient documentation

## 2019-04-08 DIAGNOSIS — F3342 Major depressive disorder, recurrent, in full remission: Secondary | ICD-10-CM | POA: Insufficient documentation

## 2019-04-08 DIAGNOSIS — F3341 Major depressive disorder, recurrent, in partial remission: Secondary | ICD-10-CM | POA: Insufficient documentation

## 2019-04-08 MED ORDER — ESCITALOPRAM OXALATE 20 MG PO TABS: 20.0000 mg | ORAL_TABLET | Freq: Every day | ORAL | 1 refills | Status: DC

## 2019-04-08 MED ORDER — HYDROCODONE-ACETAMINOPHEN 10-300 MG PO TABS: 1.0000 | ORAL_TABLET | Freq: Four times a day (QID) | ORAL | 0 refills | Status: AC | PRN

## 2019-04-08 MED ORDER — ALPRAZOLAM 0.25 MG PO TABS: 0.2500 mg | ORAL_TABLET | Freq: Every day | ORAL | 0 refills | Status: DC | PRN

## 2019-04-08 NOTE — Progress Notes (Signed)
Virtual Visit via Video Note  I connected with Candice [name] on 04/08/19 at 10:00 AM EDT by a video enabled telemedicine application and verified that I am speaking with the correct person using two identifiers.   I discussed the limitations of evaluation and management by telemedicine and the availability of in person appointments. The patient expressed understanding and agreed to proceed.   I discussed the assessment and treatment plan with the patient. The patient was provided an opportunity to ask questions and all were answered. The patient agreed with the plan and demonstrated an understanding of the instructions.   The patient was advised to call back or seek an in-person evaluation if the symptoms worsen or if the condition fails to improve as anticipated.   [facility] MD OP Progress Note  04/08/2019 1:06 PM [name]  MRN:  [id number]  Chief Complaint:  Chief Complaint    Follow-up     HPI: Candice [name] is a 33 year old Caucasian female, employed, lives in [place], has a history of depression, panic attacks, bilateral hearing loss, migraine headaches was evaluated by telemedicine today.   Patient today reports her husband recently lost her job and that has taken a toll on her emotional wellbeing.  She however reports she has been coping okay.  She and her family are planning a trip to the beach to get away and to relax this weekend.  She reports her work is going well so far.  Patient reports she is doing okay on the Lexapro.  She does not use the Xanax much.  She does have some on and off anxiety attacks however she has been coping well.  She reports sleep is good.  Patient denies any suicidality, homicidality or perceptual disturbances.  Patient denies any other concerns today. Visit Diagnosis:    ICD-10-CM   1. MDD (major depressive disorder), recurrent, in partial remission (HCC)  F33.41 escitalopram (LEXAPRO) 20 MG tablet    ALPRAZolam (XANAX) 0.25 MG tablet  2.  Panic attacks  F41.0     Past Psychiatric History: I have reviewed past psychiatric history from my progress note on 01/10/2019  Past Medical History:  Past Medical History:  Diagnosis Date  . Anxiety   . Depression   . Hearing loss   . Migraines    a. x 10+ years.  . Postpartum care following cesarean delivery (5/29) 02/04/2014  . Pre-syncope    a. 07/2015 [facility] ED visit, ? vasovagal, improved with IVF.  [name] Short cervix in second trimester, antepartum 11/16/2013    Past Surgical History:  Procedure Laterality Date  . CESAREAN SECTION N/A 02/03/2014   Procedure: Primary Cesarean Section Delivery Baby Girl @ 4259, Apgars 9/9;  Surgeon: Marvene Staff, MD;  Location: [facility];  Service: Obstetrics;  Laterality: N/A;  . CESAREAN SECTION    . WISDOM TOOTH EXTRACTION  2002    Family Psychiatric History: Reviewed family psychiatric history from my progress note on 01/10/2019. Family History:  Family History  Problem Relation Age of Onset  . Depression Mother   . Anxiety disorder Mother   . Heart attack Mother        NSTEMI @ age 71.  [name] Alcohol abuse Paternal Grandfather   . Sleep apnea Father        alive @ 25.  [name] Heart attack Maternal Grandfather   . Anxiety disorder Maternal Grandmother   . Depression Maternal Grandmother   . Diabetes Maternal Grandmother   . Hypertension Maternal Grandmother  Social History: I have reviewed social history from my progress note on 01/10/2019. Social History   Socioeconomic History  . Marital status: Married    Spouse name: Not on file  . Number of children: 1  . Years of education: Not on file  . Highest education level: Not on file  Occupational History  . Not on file  Social Needs  . Financial resource strain: Not hard at all  . Food insecurity    Worry: Never true    Inability: Never true  . Transportation needs    Medical: No    Non-medical: No  Tobacco Use  . Smoking status: Never Smoker  . Smokeless tobacco: Never Used   Substance and Sexual Activity  . Alcohol use: Yes    Alcohol/week: 0.0 standard drinks    Comment: maybe two drinks/month.  . Drug use: No  . Sexual activity: Yes    Birth control/protection: Implant  Lifestyle  . Physical activity    Days per week: 0 days    Minutes per session: 0 min  . Stress: Not on file  Relationships  . Social Musicianconnections    Talks on phone: Not on file    Gets together: Not on file    Attends religious service: Not on file    Active member of club or organization: Not on file    Attends meetings of clubs or organizations: Not on file    Relationship status: Married  Other Topics Concern  . Not on file  Social History Narrative   Lives in [place] with her husband and 5517 month old dtr.  Does not routinely exercise.        Allergies:  Allergies  Allergen Reactions  . Sulfa Antibiotics Other (See Comments)  . Sulfur Hives    Metabolic Disorder Labs: No results found for: HGBA1C, MPG No results found for: PROLACTIN No results found for: CHOL, TRIG, HDL, CHOLHDL, VLDL, LDLCALC Lab Results  Component Value Date   TSH 0.696 07/12/2015    Therapeutic Level Labs: No results found for: LITHIUM No results found for: VALPROATE No components found for:  CBMZ  Current Medications: Current Outpatient Medications  Medication Sig Dispense Refill  . ALPRAZolam (XANAX) 0.25 MG tablet Take 1 tablet (0.25 mg total) by mouth daily as needed for anxiety. 12 tablet 0  . azithromycin (ZITHROMAX Z-PAK) 250 MG tablet As directed (Patient not taking: Reported on 11/05/2018) 6 tablet 0  . cyclobenzaprine (FLEXERIL) 10 MG tablet TAKE 1 TABLET BY MOUTH EVERY 8 HOURS AS NEEDED FOR MUSCLE SPASMS. 30 tablet 1  . eletriptan (RELPAX) 40 MG tablet TAKE 1 TABLET BY MOUTH AS NEEDED FOR MIGRAINE OR HEADACHE. MAY REPEAT IN 2 HOURS IF HEADACHE PERSISTS OR RECURS. 10 tablet 11  . escitalopram (LEXAPRO) 20 MG tablet Take 1 tablet (20 mg total) by mouth daily. 90 tablet 1  .  fluticasone (FLONASE) 50 MCG/ACT nasal spray Place 2 sprays into both nostrils daily. (Patient not taking: Reported on 11/05/2018) 16 g 0  . HYDROcodone-Acetaminophen 10-300 MG TABS Take 1-2 tablets by mouth every 6 (six) hours as needed. 20 tablet 0  . ibuprofen (ADVIL,MOTRIN) 800 MG tablet Take 1 tablet (800 mg total) by mouth every 8 (eight) hours as needed. 60 tablet 1  . levonorgestrel-ethinyl estradiol (KURVELO) 0.15-30 MG-MCG tablet Take 1 tablet by mouth daily. Active pills only 3 Package 5  . levonorgestrel-ethinyl estradiol (KURVELO) 0.15-30 MG-MCG tablet Take 1 tablet by mouth daily. 1 Package 11  . meclizine (  ANTIVERT) 12.5 MG tablet Take 1 tablet (12.5 mg total) by mouth 3 (three) times daily as needed for dizziness. 30 tablet 0  . metoprolol tartrate (LOPRESSOR) 25 MG tablet Take 2 tablets (50 mg total) by mouth 2 (two) times daily. 60 tablet 3  . naproxen sodium (ANAPROX DS) 550 MG tablet Take 1 tablet (550 mg total) by mouth as needed for headache. 60 tablet 1  . norethindrone (MICRONOR,CAMILA,ERRIN) 0.35 MG tablet   0  . ondansetron (ZOFRAN-ODT) 4 MG disintegrating tablet DISSOLVE 1 TABLET BY MOUTH EVERY 8 (EIGHT) HOURS AS NEEDED FOR NAUSEA OR VOMITING. 20 tablet 6  . promethazine (PHENERGAN) 25 MG suppository Place 1 suppository (25 mg total) rectally every 6 (six) hours as needed for nausea or vomiting. 12 each 0  . promethazine (PHENERGAN) 25 MG tablet Take 1 tablet (25 mg total) by mouth every 8 (eight) hours as needed for nausea or vomiting. 30 tablet 0  . SUMAtriptan (IMITREX) 100 MG tablet Take 1 tablet (100 mg total) by mouth as needed. (Patient not taking: Reported on 11/05/2018) 12 tablet 11  . SUMAtriptan 6 MG/0.5ML SOAJ Inject 1 cartridge into the skin as needed (migraine). May repeat in 2 hours.  No more than 2 doses of Imitrex per day. 12 Cartridge 3  . valACYclovir (VALTREX) 1000 MG tablet Take 2 tablets (2,000 mg total) by mouth 2 (two) times daily. 4 tablet 0  .  zolmitriptan (ZOMIG) 5 MG nasal solution Place 1 spray into the nose as needed for migraine. 6 Units 5   No current facility-administered medications for this visit.      Musculoskeletal: Strength & Muscle Tone: UTA Gait & Station: normal Patient leans: N/A  Psychiatric Specialty Exam: Review of Systems  Psychiatric/Behavioral: The patient is nervous/anxious.   All other systems reviewed and are negative.   There were no vitals taken for this visit.There is no height or weight on file to calculate BMI.  General Appearance: Casual  Eye Contact:  Fair  Speech:  Normal Rate  Volume:  Normal  Mood:  Anxious  Affect:  Appropriate  Thought Process:  Goal Directed and Descriptions of Associations: Intact  Orientation:  Full (Time, Place, and Person)  Thought Content: Logical   Suicidal Thoughts:  No  Homicidal Thoughts:  No  Memory:  Immediate;   Fair Recent;   Fair Remote;   Good  Judgement:  Fair  Insight:  Fair  Psychomotor Activity:  Normal  Concentration:  Concentration: Fair and Attention Span: Fair  Recall:  [employer]Fair  Fund of Knowledge: Fair  Language: Fair  Akathisia:  No  Handed:  Right  AIMS (if indicated): Denies tremors, rigidity  Assets:  Communication Skills Desire for Improvement Social Support  ADL's:  Intact  Cognition: WNL  Sleep:  Fair   Screenings:   Assessment and Plan: [name] is a 33 year old Caucasian female, employed, married, has a history of depression and anxiety currently making progress on medications.  Plan as noted below.    Plan  For MDD in remission Lexapro 20 mg p.o. daily. Patient was referred back for therapy last visit.  For anxiety attacks-stable Xanax 0.25 mg p.o. as needed only for severe anxiety attacks.  She has been limiting use.  Follow-up in clinic in 3 months or sooner if needed.  November 6 at 10 AM  I have spent atleast 15 minutes non face to face with patient today. More than 50 % of the time was spent for  psychoeducation and supportive psychotherapy  and care coordination.  This note was generated in part or whole with voice recognition software. Voice recognition is usually quite accurate but there are transcription errors that can and very often do occur. I apologize for any typographical errors that were not detected and corrected.        Jomarie LongsSaramma Tirso Laws, MD 04/08/2019, 1:06 PM

## 2019-04-12 ENCOUNTER — Encounter: Payer: Self-pay | Admitting: *Deleted

## 2019-05-09 ENCOUNTER — Telehealth: Payer: Self-pay

## 2019-05-09 DIAGNOSIS — F410 Panic disorder [episodic paroxysmal anxiety] without agoraphobia: Secondary | ICD-10-CM

## 2019-05-09 DIAGNOSIS — F3341 Major depressive disorder, recurrent, in partial remission: Secondary | ICD-10-CM

## 2019-05-09 MED ORDER — BUSPIRONE HCL 7.5 MG PO TABS: 7.5000 mg | ORAL_TABLET | Freq: Two times a day (BID) | ORAL | 1 refills | Status: DC

## 2019-05-09 NOTE — Telephone Encounter (Signed)
Called patient -discussed starting BUspar - start busapr 7.5 mg bid.

## 2019-05-09 NOTE — Telephone Encounter (Signed)
pt called states that she was wondering if you can add or increase a medication. she states her husband has lost his job and that her child was dx last week with a seizure disorder and she is under alot of stress and she doesnt want to do the xanax  so she wanted to know if it was something that can be added or changed.

## 2019-05-10 ENCOUNTER — Other Ambulatory Visit: Payer: Self-pay | Admitting: Physician Assistant

## 2019-05-13 ENCOUNTER — Other Ambulatory Visit: Payer: Self-pay

## 2019-05-13 ENCOUNTER — Encounter: Payer: Self-pay | Admitting: Physician Assistant

## 2019-05-13 ENCOUNTER — Ambulatory Visit: Payer: 59 | Admitting: Physician Assistant

## 2019-05-13 VITALS — BP 128/84 | HR 97 | Wt 164.0 lb

## 2019-05-13 DIAGNOSIS — M62838 Other muscle spasm: Secondary | ICD-10-CM

## 2019-05-13 DIAGNOSIS — G43009 Migraine without aura, not intractable, without status migrainosus: Secondary | ICD-10-CM

## 2019-05-13 DIAGNOSIS — F418 Other specified anxiety disorders: Secondary | ICD-10-CM | POA: Insufficient documentation

## 2019-05-13 DIAGNOSIS — F439 Reaction to severe stress, unspecified: Secondary | ICD-10-CM

## 2019-05-13 DIAGNOSIS — F419 Anxiety disorder, unspecified: Secondary | ICD-10-CM | POA: Diagnosis not present

## 2019-05-13 MED ORDER — NURTEC 75 MG PO TBDP: 75.0000 mg | ORAL_TABLET | ORAL | 2 refills | Status: AC | PRN

## 2019-05-13 MED ORDER — IBUPROFEN 800 MG PO TABS: 800.0000 mg | ORAL_TABLET | Freq: Three times a day (TID) | ORAL | 1 refills | Status: DC | PRN

## 2019-05-13 MED ORDER — HYDROCODONE-ACETAMINOPHEN 10-325 MG PO TABS: 1.0000 | ORAL_TABLET | Freq: Four times a day (QID) | ORAL | 0 refills | Status: DC | PRN

## 2019-05-13 MED ORDER — CYCLOBENZAPRINE HCL 10 MG PO TABS: ORAL_TABLET | ORAL | 1 refills | Status: DC

## 2019-05-13 MED ORDER — METOPROLOL TARTRATE 25 MG PO TABS: 50.0000 mg | ORAL_TABLET | Freq: Two times a day (BID) | ORAL | 3 refills | Status: DC

## 2019-05-13 NOTE — Patient Instructions (Signed)

## 2019-05-13 NOTE — Progress Notes (Signed)
History:  Candice [name] is a 33 y.o. G1P1001 who presents to clinic today for headache followup. The severe headaches are less often - occurring about once per month.  She is having mild headaches at least every other day and often more.  She did use metoprolol but not for long and wants to retry it.  She did not take it long enough to know if it helped or not. Anxiety is worse - just started Buspar last night.  Flexeril she uses 3-4 times per week in the evening and she feels it has helped with muscle tension and headaches.    She puts off Relpax due to the way it makes her feel. Imitrex has not been effective.  Maxalt used to be most helpful but efficacy waned over the years.     HIT6:60  Past Medical History:  Diagnosis Date  . Anxiety   . Depression   . Hearing loss   . Migraines    a. x 10+ years.  . Postpartum care following cesarean delivery (5/29) 02/04/2014  . Pre-syncope    a. 07/2015 [facility] ED visit, ? vasovagal, improved with IVF.  [name] Short cervix in second trimester, antepartum 11/16/2013    Social History   Socioeconomic History  . Marital status: Married    Spouse name: Not on file  . Number of children: 1  . Years of education: Not on file  . Highest education level: Not on file  Occupational History  . Not on file  Social Needs  . Financial resource strain: Not hard at all  . Food insecurity    Worry: Never true    Inability: Never true  . Transportation needs    Medical: No    Non-medical: No  Tobacco Use  . Smoking status: Never Smoker  . Smokeless tobacco: Never Used  Substance and Sexual Activity  . Alcohol use: Yes    Alcohol/week: 0.0 standard drinks    Comment: maybe two drinks/month.  . Drug use: No  . Sexual activity: Yes    Birth control/protection: Implant  Lifestyle  . Physical activity    Days per week: 0 days    Minutes per session: 0 min  . Stress: Not on file  Relationships  . Social Herbalist on phone: Not on file     Gets together: Not on file    Attends religious service: Not on file    Active member of club or organization: Not on file    Attends meetings of clubs or organizations: Not on file    Relationship status: Married  . Intimate partner violence    Fear of current or ex partner: Not on file    Emotionally abused: Not on file    Physically abused: Not on file    Forced sexual activity: Not on file  Other Topics Concern  . Not on file  Social History Narrative   Lives in [place] with her husband and 27 month old dtr.  Does not routinely exercise.        Family History  Problem Relation Age of Onset  . Depression Mother   . Anxiety disorder Mother   . Heart attack Mother        NSTEMI @ age 67.  [name] Alcohol abuse Paternal Grandfather   . Sleep apnea Father        alive @ 18.  [name] Heart attack Maternal Grandfather   . Anxiety disorder Maternal Grandmother   .  Depression Maternal Grandmother   . Diabetes Maternal Grandmother   . Hypertension Maternal Grandmother     Allergies  Allergen Reactions  . Sulfa Antibiotics Other (See Comments)  . Sulfur Hives    Current Outpatient Medications on File Prior to Visit  Medication Sig Dispense Refill  . ALPRAZolam (XANAX) 0.25 MG tablet Take 1 tablet (0.25 mg total) by mouth daily as needed for anxiety. 12 tablet 0  . busPIRone (BUSPAR) 7.5 MG tablet Take 1 tablet (7.5 mg total) by mouth 2 (two) times daily. 60 tablet 1  . cyclobenzaprine (FLEXERIL) 10 MG tablet TAKE 1 TABLET BY MOUTH EVERY 8 HOURS AS NEEDED FOR MUSCLE SPASMS. 30 tablet 1  . eletriptan (RELPAX) 40 MG tablet TAKE 1 TABLET BY MOUTH AS NEEDED FOR MIGRAINE OR HEADACHE. MAY REPEAT IN 2 HOURS IF HEADACHE PERSISTS OR RECURS. 10 tablet 11  . escitalopram (LEXAPRO) 20 MG tablet Take 1 tablet (20 mg total) by mouth daily. 90 tablet 1  . ibuprofen (ADVIL,MOTRIN) 800 MG tablet Take 1 tablet (800 mg total) by mouth every 8 (eight) hours as needed. 60 tablet 1  .  levonorgestrel-ethinyl estradiol (KURVELO) 0.15-30 MG-MCG tablet Take 1 tablet by mouth daily. Active pills only 3 Package 5  . levonorgestrel-ethinyl estradiol (KURVELO) 0.15-30 MG-MCG tablet Take 1 tablet by mouth daily. 1 Package 11  . meclizine (ANTIVERT) 12.5 MG tablet Take 1 tablet (12.5 mg total) by mouth 3 (three) times daily as needed for dizziness. 30 tablet 0  . metoprolol tartrate (LOPRESSOR) 25 MG tablet Take 2 tablets (50 mg total) by mouth 2 (two) times daily. 60 tablet 3  . ondansetron (ZOFRAN-ODT) 4 MG disintegrating tablet DISSOLVE 1 TABLET BY MOUTH EVERY 8 (EIGHT) HOURS AS NEEDED FOR NAUSEA OR VOMITING. 20 tablet 6  . promethazine (PHENERGAN) 25 MG suppository Place 1 suppository (25 mg total) rectally every 6 (six) hours as needed for nausea or vomiting. 12 each 0  . promethazine (PHENERGAN) 25 MG tablet Take 1 tablet (25 mg total) by mouth every 8 (eight) hours as needed for nausea or vomiting. 30 tablet 0  . SUMAtriptan 6 MG/0.5ML SOAJ Inject 1 cartridge into the skin as needed (migraine). May repeat in 2 hours.  No more than 2 doses of Imitrex per day. 12 Cartridge 3  . valACYclovir (VALTREX) 1000 MG tablet Take 2 tablets (2,000 mg total) by mouth 2 (two) times daily. 4 tablet 0  . zolmitriptan (ZOMIG) 5 MG nasal solution Place 1 spray into the nose as needed for migraine. 6 Units 5  . azithromycin (ZITHROMAX Z-PAK) 250 MG tablet As directed (Patient not taking: Reported on 11/05/2018) 6 tablet 0  . fluticasone (FLONASE) 50 MCG/ACT nasal spray Place 2 sprays into both nostrils daily. (Patient not taking: Reported on 11/05/2018) 16 g 0  . naproxen sodium (ANAPROX DS) 550 MG tablet Take 1 tablet (550 mg total) by mouth as needed for headache. (Patient not taking: Reported on 05/13/2019) 60 tablet 1  . SUMAtriptan (IMITREX) 100 MG tablet Take 1 tablet (100 mg total) by mouth as needed. (Patient not taking: Reported on 11/05/2018) 12 tablet 11   No current facility-administered  medications on file prior to visit.      Review of Systems:  All pertinent positive/negative included in HPI, all other review of systems are negative   Objective:  Physical Exam BP 128/84   Pulse 97   Wt 164 lb (74.4 kg)   BMI 28.15 kg/m  CONSTITUTIONAL: Well-developed, well-nourished female in no  acute distress.  EYES: EOM intact ENT: Normocephalic CARDIOVASCULAR: Regular rate  RESPIRATORY: Normal rate.  MUSCULOSKELETAL: Normal ROM SKIN: Warm, dry without erythema  NEUROLOGICAL: Alert, oriented, CN II-XII grossly intact, Appropriate balance   PSYCH: Normal behavior, mood   Assessment & Plan:  Assessment: 1. Migraine without aura and without status migrainosus, not intractable   2. Muscle spasm   3. Stress at home   4. Anxiety    Insufficient improvement  Plan: Re try metoprolol - titrate to 50mg  as tolerated.  Wait a few weeks after the buspar initiation to be sure of what med is causing what effect.  I expect metoprolol to prevent migraine AND make anxiety better. Will trial Nurtec in place of triptans. Sample provided. Continue other meds as prior. Follow-up in 3-6 months or sooner PRN  Bertram Denvereague Clark, Karen E, PA-C 05/13/2019 9:26 AM

## 2019-06-06 ENCOUNTER — Encounter: Payer: Self-pay | Admitting: Psychiatry

## 2019-06-06 ENCOUNTER — Other Ambulatory Visit: Payer: Self-pay

## 2019-06-06 ENCOUNTER — Ambulatory Visit (INDEPENDENT_AMBULATORY_CARE_PROVIDER_SITE_OTHER): Payer: 59 | Admitting: Psychiatry

## 2019-06-06 DIAGNOSIS — F3341 Major depressive disorder, recurrent, in partial remission: Secondary | ICD-10-CM

## 2019-06-06 DIAGNOSIS — F410 Panic disorder [episodic paroxysmal anxiety] without agoraphobia: Secondary | ICD-10-CM

## 2019-06-06 NOTE — Progress Notes (Signed)
Virtual Visit via Video Note  I connected with Candice [name] on 06/06/19 at  3:45 PM EDT by a video enabled telemedicine application and verified that I am speaking with the correct person using two identifiers.   I discussed the limitations of evaluation and management by telemedicine and the availability of in person appointments. The patient expressed understanding and agreed to proceed.   I discussed the assessment and treatment plan with the patient. The patient was provided an opportunity to ask questions and all were answered. The patient agreed with the plan and demonstrated an understanding of the instructions.   The patient was advised to call back or seek an in-person evaluation if the symptoms worsen or if the condition fails to improve as anticipated.   [facility] MD OP Progress Note  06/06/2019 5:14 PM Candice [name]  MRN:  [id number]  Chief Complaint:  Chief Complaint    Follow-up     HPI: Candice [name] is a 33 year old Caucasian female, employed, lives in [place], has a history of depression, panic attacks, bilateral hearing loss, migraine headache was evaluated by telemedicine today.  Due to connection problem the session had to be changed to a phone call during the evaluation.  Patient today reports she and her husband are currently going to get a separation.  She reports that has been affecting her anxiety symptoms.  She reports she has noticed some worsening panic attacks recently.  She reports she tried the BuSpar however it does not seem to have much effect on her anxiety yet.  She has been taking only a small dosage at bedtime since it made her sleepy.  She has not taken the twice a day dosage yet.  Patient reports she continues to be compliant on her Lexapro.  Patient denies any suicidality, homicidality or perceptual disturbances.  Discussed increasing her BuSpar.  She reports she can go up to 15 mg at bedtime.  Discussed with patient to start psychotherapy  sessions.  She reports she will reach out to [facility]. Visit Diagnosis:    ICD-10-CM   1. MDD (major depressive disorder), recurrent, in partial remission (HCC)  F33.41   2. Panic attacks  F41.0     Past Psychiatric History: I have reviewed past psychiatric history from my progress note on 01/10/2019  Past Medical History:  Past Medical History:  Diagnosis Date  . Anxiety   . Depression   . Hearing loss   . Migraines    a. x 10+ years.  . Postpartum care following cesarean delivery (5/29) 02/04/2014  . Pre-syncope    a. 07/2015 [facility] ED visit, ? vasovagal, improved with IVF.  [name] Short cervix in second trimester, antepartum 11/16/2013    Past Surgical History:  Procedure Laterality Date  . CESAREAN SECTION N/A 02/03/2014   Procedure: Primary Cesarean Section Delivery Baby Girl @ 2244, Apgars 9/9;  Surgeon: Serita Kyle, MD;  Location: [facility] ORS;  Service: Obstetrics;  Laterality: N/A;  . CESAREAN SECTION    . WISDOM TOOTH EXTRACTION  2002    Family Psychiatric History: I have reviewed family psychiatric history from my progress note on 01/10/2019  Family History:  Family History  Problem Relation Age of Onset  . Depression Mother   . Anxiety disorder Mother   . Heart attack Mother        NSTEMI @ age 36.  [name] Alcohol abuse Paternal Grandfather   . Sleep apnea Father        alive @ 31.  [name]  Heart attack Maternal Grandfather   . Anxiety disorder Maternal Grandmother   . Depression Maternal Grandmother   . Diabetes Maternal Grandmother   . Hypertension Maternal Grandmother     Social History: I have reviewed social history from my progress note on 01/10/2019 Social History   Socioeconomic History  . Marital status: Married    Spouse name: Not on file  . Number of children: 1  . Years of education: Not on file  . Highest education level: Not on file  Occupational History  . Not on file  Social Needs  . Financial resource strain: Not hard at all  . Food insecurity     Worry: Never true    Inability: Never true  . Transportation needs    Medical: No    Non-medical: No  Tobacco Use  . Smoking status: Never Smoker  . Smokeless tobacco: Never Used  Substance and Sexual Activity  . Alcohol use: Yes    Alcohol/week: 0.0 standard drinks    Comment: maybe two drinks/month.  . Drug use: No  . Sexual activity: Yes    Birth control/protection: Implant  Lifestyle  . Physical activity    Days per week: 0 days    Minutes per session: 0 min  . Stress: Not on file  Relationships  . Social Herbalist on phone: Not on file    Gets together: Not on file    Attends religious service: Not on file    Active member of club or organization: Not on file    Attends meetings of clubs or organizations: Not on file    Relationship status: Married  Other Topics Concern  . Not on file  Social History Narrative   Lives in [place] with her husband and 101 month old dtr.  Does not routinely exercise.        Allergies:  Allergies  Allergen Reactions  . Sulfa Antibiotics Other (See Comments)  . Sulfur Hives    Metabolic Disorder Labs: No results found for: HGBA1C, MPG No results found for: PROLACTIN No results found for: CHOL, TRIG, HDL, CHOLHDL, VLDL, LDLCALC Lab Results  Component Value Date   TSH 0.696 07/12/2015    Therapeutic Level Labs: No results found for: LITHIUM No results found for: VALPROATE No components found for:  CBMZ  Current Medications: Current Outpatient Medications  Medication Sig Dispense Refill  . ALPRAZolam (XANAX) 0.25 MG tablet Take 1 tablet (0.25 mg total) by mouth daily as needed for anxiety. 12 tablet 0  . azithromycin (ZITHROMAX Z-PAK) 250 MG tablet As directed (Patient not taking: Reported on 11/05/2018) 6 tablet 0  . busPIRone (BUSPAR) 7.5 MG tablet Take 1 tablet (7.5 mg total) by mouth 2 (two) times daily. 60 tablet 1  . cyclobenzaprine (FLEXERIL) 10 MG tablet TAKE 1 TABLET BY MOUTH EVERY 8 HOURS AS NEEDED FOR  MUSCLE SPASMS. 30 tablet 1  . eletriptan (RELPAX) 40 MG tablet TAKE 1 TABLET BY MOUTH AS NEEDED FOR MIGRAINE OR HEADACHE. MAY REPEAT IN 2 HOURS IF HEADACHE PERSISTS OR RECURS. 10 tablet 11  . escitalopram (LEXAPRO) 20 MG tablet Take 1 tablet (20 mg total) by mouth daily. 90 tablet 1  . fluticasone (FLONASE) 50 MCG/ACT nasal spray Place 2 sprays into both nostrils daily. (Patient not taking: Reported on 11/05/2018) 16 g 0  . HYDROcodone-acetaminophen (NORCO) 10-325 MG tablet Take 1 tablet by mouth every 6 (six) hours as needed. 30 tablet 0  . ibuprofen (ADVIL) 800 MG tablet Take  1 tablet (800 mg total) by mouth every 8 (eight) hours as needed. 60 tablet 1  . levonorgestrel-ethinyl estradiol (KURVELO) 0.15-30 MG-MCG tablet Take 1 tablet by mouth daily. Active pills only 3 Package 5  . levonorgestrel-ethinyl estradiol (KURVELO) 0.15-30 MG-MCG tablet Take 1 tablet by mouth daily. 1 Package 11  . meclizine (ANTIVERT) 12.5 MG tablet Take 1 tablet (12.5 mg total) by mouth 3 (three) times daily as needed for dizziness. 30 tablet 0  . metoprolol tartrate (LOPRESSOR) 25 MG tablet Take 2 tablets (50 mg total) by mouth 2 (two) times daily. 60 tablet 3  . naproxen sodium (ANAPROX DS) 550 MG tablet Take 1 tablet (550 mg total) by mouth as needed for headache. (Patient not taking: Reported on 05/13/2019) 60 tablet 1  . ondansetron (ZOFRAN-ODT) 4 MG disintegrating tablet DISSOLVE 1 TABLET BY MOUTH EVERY 8 (EIGHT) HOURS AS NEEDED FOR NAUSEA OR VOMITING. 20 tablet 6  . promethazine (PHENERGAN) 25 MG suppository Place 1 suppository (25 mg total) rectally every 6 (six) hours as needed for nausea or vomiting. 12 each 0  . promethazine (PHENERGAN) 25 MG tablet Take 1 tablet (25 mg total) by mouth every 8 (eight) hours as needed for nausea or vomiting. 30 tablet 0  . Rimegepant Sulfate (NURTEC) 75 MG TBDP Take 75 mg by mouth as needed. 8 tablet 2  . SUMAtriptan (IMITREX) 100 MG tablet Take 1 tablet (100 mg total) by mouth as  needed. (Patient not taking: Reported on 11/05/2018) 12 tablet 11  . SUMAtriptan 6 MG/0.5ML SOAJ Inject 1 cartridge into the skin as needed (migraine). May repeat in 2 hours.  No more than 2 doses of Imitrex per day. 12 Cartridge 3  . valACYclovir (VALTREX) 1000 MG tablet Take 2 tablets (2,000 mg total) by mouth 2 (two) times daily. 4 tablet 0  . zolmitriptan (ZOMIG) 5 MG nasal solution Place 1 spray into the nose as needed for migraine. 6 Units 5   No current facility-administered medications for this visit.      Musculoskeletal: Strength & Muscle Tone: UTA Gait & Station: WNL Patient leans: N/A  Psychiatric Specialty Exam: Review of Systems  Psychiatric/Behavioral: The patient is nervous/anxious.   All other systems reviewed and are negative.   There were no vitals taken for this visit.There is no height or weight on file to calculate BMI.  General Appearance: UTA  Eye Contact:  UTA  Speech:  Clear and Coherent  Volume:  Normal  Mood:  Anxious  Affect:  UTA  Thought Process:  Goal Directed and Descriptions of Associations: Intact  Orientation:  Full (Time, Place, and Person)  Thought Content: Logical   Suicidal Thoughts:  No  Homicidal Thoughts:  No  Memory:  Immediate;   Fair Recent;   Fair Remote;   Fair  Judgement:  Fair  Insight:  Fair  Psychomotor Activity:  UTA  Concentration:  Concentration: Fair and Attention Span: Fair  Recall:  F[employer]ervFair  Fund of Knowledge: Fair  Language: Fair  Akathisia:  No  Handed:  Right  AIMS (if indicated): Denies tremors, rigidity  Assets:  Communication Skills Desire for Improvement Housing  ADL's:  Intact  Cognition: WNL  Sleep:  Fair   Screenings:   Assessment and Plan: Candice [name] is a 33 year old Caucasian female, employed, married, has a history of depression, anxiety, continues to struggle with worsening panic symptoms.  She does have several psychosocial stressors including ongoing relationship struggles and upcoming  separation from her husband.  Patient will benefit from  psychotherapy sessions as well as medication readjustment.  Plan For MDD in remission Lexapro 20 mg p.o. daily Patient was referred back for therapy last visit however she has been noncompliant. She reports she will reach out to [facility]  For anxiety attacks-unstable Xanax 0.25 mg p.o. as needed for severe anxiety attacks.  She has been limiting use Discussed with patient to reach out to [facility]. She will need psychotherapy sessions. Increase BuSpar to 15 mg p.o. daily.   Follow-up in clinic in 4 weeks or sooner if needed. Nov 5 , 11:30 am.  I have spent atleast 15 minutes non face to face with patient today. More than 50 % of the time was spent for psychoeducation and supportive psychotherapy and care coordination. This note was generated in part or whole with voice recognition software. Voice recognition is usually quite accurate but there are transcription errors that can and very often do occur. I apologize for any typographical errors that were not detected and corrected.        Jomarie Longs, MD 06/06/2019, 5:14 PM

## 2019-06-13 ENCOUNTER — Other Ambulatory Visit: Payer: Self-pay

## 2019-06-28 DIAGNOSIS — H903 Sensorineural hearing loss, bilateral: Secondary | ICD-10-CM | POA: Diagnosis not present

## 2019-06-28 DIAGNOSIS — H90A22 Sensorineural hearing loss, unilateral, left ear, with restricted hearing on the contralateral side: Secondary | ICD-10-CM | POA: Diagnosis not present

## 2019-07-14 ENCOUNTER — Other Ambulatory Visit: Payer: Self-pay

## 2019-07-14 ENCOUNTER — Ambulatory Visit (INDEPENDENT_AMBULATORY_CARE_PROVIDER_SITE_OTHER): Payer: 59 | Admitting: Psychiatry

## 2019-07-14 ENCOUNTER — Encounter: Payer: Self-pay | Admitting: Psychiatry

## 2019-07-14 DIAGNOSIS — F5105 Insomnia due to other mental disorder: Secondary | ICD-10-CM | POA: Insufficient documentation

## 2019-07-14 DIAGNOSIS — F410 Panic disorder [episodic paroxysmal anxiety] without agoraphobia: Secondary | ICD-10-CM

## 2019-07-14 DIAGNOSIS — G4700 Insomnia, unspecified: Secondary | ICD-10-CM | POA: Diagnosis not present

## 2019-07-14 DIAGNOSIS — F3341 Major depressive disorder, recurrent, in partial remission: Secondary | ICD-10-CM | POA: Diagnosis not present

## 2019-07-14 MED ORDER — BUSPIRONE HCL 7.5 MG PO TABS: 7.5000 mg | ORAL_TABLET | Freq: Two times a day (BID) | ORAL | 1 refills | Status: DC

## 2019-07-14 MED ORDER — ALPRAZOLAM 0.25 MG PO TABS: 0.2500 mg | ORAL_TABLET | Freq: Every day | ORAL | 1 refills | Status: DC | PRN

## 2019-07-14 NOTE — Progress Notes (Signed)
Virtual Visit via Video Note  I connected with Candice [name] on 07/14/19 at 11:30 AM EST by a video enabled telemedicine application and verified that I am speaking with the correct person using two identifiers.   I discussed the limitations of evaluation and management by telemedicine and the availability of in person appointments. The patient expressed understanding and agreed to proceed.    I discussed the assessment and treatment plan with the patient. The patient was provided an opportunity to ask questions and all were answered. The patient agreed with the plan and demonstrated an understanding of the instructions.   The patient was advised to call back or seek an in-person evaluation if the symptoms worsen or if the condition fails to improve as anticipated.  BH MD OP Progress Note  07/14/2019 12:03 PM Chapman FitchStephanie T Clymer  MRN:  [id number]  Chief Complaint:  Chief Complaint    Follow-up     HPI: Candice CornfieldStephanie is a 33 year old Caucasian female, employed, lives in [place] has a history of depression, panic attacks, bilateral hearing loss, migraine headache was evaluated by telemedicine today.  Patient today reports she continues to have multiple psychosocial stressors.  She continues to struggle with relationship problems with her husband.  Her husband is unemployed and she does have financial problems.  Patient also reports work-related problems.  She reports she tried taking the BuSpar higher dosage-15 mg twice a day.  She was taking the second dosage of 15 mg close to bedtime.  She reports ever since she increased the dosage she started having vivid dreams.  Her sleep hence was restless for a week or 2.  She has stopped taking the BuSpar and she reports she does not have that many vivid dreams as she had before and sleep may be getting better.  Patient reports she otherwise is doing okay.  Patient denies any suicidality, homicidality or perceptual disturbances.  Patient reports she  has not been able to start psychotherapy sessions yet.  She has made a call to [facility] and is waiting for a call back.  She does have good support system from friends.  She denies any other concerns today. Visit Diagnosis:    ICD-10-CM   1. MDD (major depressive disorder), recurrent, in partial remission (HCC)  F33.41 busPIRone (BUSPAR) 7.5 MG tablet    ALPRAZolam (XANAX) 0.25 MG tablet  2. Panic attacks  F41.0 busPIRone (BUSPAR) 7.5 MG tablet  3. Insomnia, unspecified type  G47.00     Past Psychiatric History: Reviewed past psychiatric history from my progress note on 01/10/2019  Past Medical History:  Past Medical History:  Diagnosis Date  . Anxiety   . Depression   . Hearing loss   . Migraines    a. x 10+ years.  . Postpartum care following cesarean delivery (5/29) 02/04/2014  . Pre-syncope    a. 07/2015 [facility] ED visit, ? vasovagal, improved with IVF.  [name]. Short cervix in second trimester, antepartum 11/16/2013    Past Surgical History:  Procedure Laterality Date  . CESAREAN SECTION N/A 02/03/2014   Procedure: Primary Cesarean Section Delivery Baby Girl @ 2244, Apgars 9/9;  Surgeon: Serita KyleSheronette A Cousins, MD;  Location: [facility];  Service: Obstetrics;  Laterality: N/A;  . CESAREAN SECTION    . WISDOM TOOTH EXTRACTION  2002    Family Psychiatric History: Reviewed family psychiatric history from my progress note on 01/10/2019  Family History:  Family History  Problem Relation Age of Onset  . Depression Mother   .  Anxiety disorder Mother   . Heart attack Mother        NSTEMI @ age 23.  [name] Alcohol abuse Paternal Grandfather   . Sleep apnea Father        alive @ 44.  [name] Heart attack Maternal Grandfather   . Anxiety disorder Maternal Grandmother   . Depression Maternal Grandmother   . Diabetes Maternal Grandmother   . Hypertension Maternal Grandmother     Social History: Reviewed social history from my progress note on 01/10/2019 Social History   Socioeconomic History  . Marital  status: Married    Spouse name: Not on file  . Number of children: 1  . Years of education: Not on file  . Highest education level: Not on file  Occupational History  . Not on file  Social Needs  . Financial resource strain: Not hard at all  . Food insecurity    Worry: Never true    Inability: Never true  . Transportation needs    Medical: No    Non-medical: No  Tobacco Use  . Smoking status: Never Smoker  . Smokeless tobacco: Never Used  Substance and Sexual Activity  . Alcohol use: Yes    Alcohol/week: 0.0 standard drinks    Comment: maybe two drinks/month.  . Drug use: No  . Sexual activity: Yes    Birth control/protection: Implant  Lifestyle  . Physical activity    Days per week: 0 days    Minutes per session: 0 min  . Stress: Not on file  Relationships  . Social Herbalist on phone: Not on file    Gets together: Not on file    Attends religious service: Not on file    Active member of club or organization: Not on file    Attends meetings of clubs or organizations: Not on file    Relationship status: Married  Other Topics Concern  . Not on file  Social History Narrative   Lives in [place] with her husband and 59 month old dtr.  Does not routinely exercise.        Allergies:  Allergies  Allergen Reactions  . Sulfa Antibiotics Other (See Comments)  . Sulfur Hives    Metabolic Disorder Labs: No results found for: HGBA1C, MPG No results found for: PROLACTIN No results found for: CHOL, TRIG, HDL, CHOLHDL, VLDL, LDLCALC Lab Results  Component Value Date   TSH 0.696 07/12/2015    Therapeutic Level Labs: No results found for: LITHIUM No results found for: VALPROATE No components found for:  CBMZ  Current Medications: Current Outpatient Medications  Medication Sig Dispense Refill  . ALPRAZolam (XANAX) 0.25 MG tablet Take 1 tablet (0.25 mg total) by mouth daily as needed for anxiety. Only for severe anxiety attacks 12 tablet 1  . busPIRone  (BUSPAR) 7.5 MG tablet Take 1 tablet (7.5 mg total) by mouth 2 (two) times daily. 60 tablet 1  . cyclobenzaprine (FLEXERIL) 10 MG tablet TAKE 1 TABLET BY MOUTH EVERY 8 HOURS AS NEEDED FOR MUSCLE SPASMS. 30 tablet 1  . eletriptan (RELPAX) 40 MG tablet TAKE 1 TABLET BY MOUTH AS NEEDED FOR MIGRAINE OR HEADACHE. MAY REPEAT IN 2 HOURS IF HEADACHE PERSISTS OR RECURS. 10 tablet 11  . escitalopram (LEXAPRO) 20 MG tablet Take 1 tablet (20 mg total) by mouth daily. 90 tablet 1  . HYDROcodone-acetaminophen (NORCO) 10-325 MG tablet Take 1 tablet by mouth every 6 (six) hours as needed. 30 tablet 0  . ibuprofen (ADVIL)  800 MG tablet Take 1 tablet (800 mg total) by mouth every 8 (eight) hours as needed. 60 tablet 1  . levonorgestrel-ethinyl estradiol (KURVELO) 0.15-30 MG-MCG tablet Take 1 tablet by mouth daily. Active pills only 3 Package 5  . levonorgestrel-ethinyl estradiol (KURVELO) 0.15-30 MG-MCG tablet Take 1 tablet by mouth daily. 1 Package 11  . meclizine (ANTIVERT) 12.5 MG tablet Take 1 tablet (12.5 mg total) by mouth 3 (three) times daily as needed for dizziness. 30 tablet 0  . naproxen sodium (ANAPROX DS) 550 MG tablet Take 1 tablet (550 mg total) by mouth as needed for headache. 60 tablet 1  . ondansetron (ZOFRAN-ODT) 4 MG disintegrating tablet DISSOLVE 1 TABLET BY MOUTH EVERY 8 (EIGHT) HOURS AS NEEDED FOR NAUSEA OR VOMITING. 20 tablet 6  . promethazine (PHENERGAN) 25 MG suppository Place 1 suppository (25 mg total) rectally every 6 (six) hours as needed for nausea or vomiting. 12 each 0  . promethazine (PHENERGAN) 25 MG tablet Take 1 tablet (25 mg total) by mouth every 8 (eight) hours as needed for nausea or vomiting. 30 tablet 0  . SUMAtriptan 6 MG/0.5ML SOAJ Inject 1 cartridge into the skin as needed (migraine). May repeat in 2 hours.  No more than 2 doses of Imitrex per day. 12 Cartridge 3  . valACYclovir (VALTREX) 1000 MG tablet Take 2 tablets (2,000 mg total) by mouth 2 (two) times daily. 4 tablet 0   . fluticasone (FLONASE) 50 MCG/ACT nasal spray Place 2 sprays into both nostrils daily. (Patient not taking: Reported on 11/05/2018) 16 g 0  . metoprolol tartrate (LOPRESSOR) 25 MG tablet Take 2 tablets (50 mg total) by mouth 2 (two) times daily. (Patient not taking: Reported on 07/14/2019) 60 tablet 3  . SUMAtriptan (IMITREX) 100 MG tablet Take 1 tablet (100 mg total) by mouth as needed. (Patient not taking: Reported on 07/14/2019) 12 tablet 11  . zolmitriptan (ZOMIG) 5 MG nasal solution Place 1 spray into the nose as needed for migraine. (Patient not taking: Reported on 07/14/2019) 6 Units 5   No current facility-administered medications for this visit.      Musculoskeletal: Strength & Muscle Tone: UTA  Gait & Station: Observed as seated Patient leans: N/A  Psychiatric Specialty Exam: Review of Systems  Psychiatric/Behavioral: The patient is nervous/anxious and has insomnia.   All other systems reviewed and are negative.   There were no vitals taken for this visit.There is no height or weight on file to calculate BMI.  General Appearance: Casual  Eye Contact:  Fair  Speech:  Normal Rate  Volume:  Normal  Mood:  Anxious  Affect:  Congruent  Thought Process:  Goal Directed and Descriptions of Associations: Intact  Orientation:  Full (Time, Place, and Person)  Thought Content: Logical   Suicidal Thoughts:  No  Homicidal Thoughts:  No  Memory:  Immediate;   Fair Recent;   Fair Remote;   Fair  Judgement:  Fair  Insight:  Fair  Psychomotor Activity:  Normal  Concentration:  Concentration: Fair and Attention Span: Fair  Recall:  [employer] of Knowledge: Fair  Language: Fair  Akathisia:  No  Handed:  Right  AIMS (if indicated): denies tremors, rigidity  Assets:  Communication Skills Desire for Improvement Social Support  ADL's:  Intact  Cognition: WNL  Sleep:  Restless - vivid dreams   Screenings:   Assessment and Plan: Varnell is a 33 year old Caucasian female,  employed, married, has a history of depression, anxiety, currently struggles with anxiety  as well as sleep problems.  She does have psychosocial stressors of relationship struggles, work-related problems.  Her sleep problems probably would also have been due to adverse side effects from BuSpar.  Discussed the following plan with patient.  Plan MDD in remission Lexapro 20 mg p.o. daily  Anxiety attacks-unstable Continue Xanax 0.25 mg p.o. as needed for severe anxiety symptoms.  Advised her to limit use.  I have reviewed [place] controlled substance database. Discussed with patient to start psychotherapy sessions-she is waiting for a call back from [facility]. Restart BuSpar 7.5 mg-however advised her to take it early during the day.  If she tolerates it okay her dosage can be increased.  Insomnia unspecified-unstable She will monitor her sleep closely. We will monitor her closely while she is on BuSpar and has advised to take the BuSpar dosage earlier during the day and not close to bedtime.  Follow-up in clinic in 4 weeks or sooner if needed.  December 4 at 9:30 AM  I have spent atleast 15 minutes non face to face with patient today. More than 50 % of the time was spent for psychoeducation and supportive psychotherapy and care coordination. This note was generated in part or whole with voice recognition software. Voice recognition is usually quite accurate but there are transcription errors that can and very often do occur. I apologize for any typographical errors that were not detected and corrected.       Jomarie Longs, MD 07/14/2019, 12:03 PM

## 2019-08-10 ENCOUNTER — Other Ambulatory Visit: Payer: Self-pay | Admitting: Physician Assistant

## 2019-08-11 NOTE — Progress Notes (Signed)
Virtual Visit via Video Note  I connected with Candice [name] on 08/12/19 at  9:30 AM EST by a video enabled telemedicine application and verified that I am speaking with the correct person using two identifiers.   I discussed the limitations of evaluation and management by telemedicine and the availability of in person appointments. The patient expressed understanding and agreed to proceed.     I discussed the assessment and treatment plan with the patient. The patient was provided an opportunity to ask questions and all were answered. The patient agreed with the plan and demonstrated an understanding of the instructions.   The patient was advised to call back or seek an in-person evaluation if the symptoms worsen or if the condition fails to improve as anticipated.   BH MD OP Progress Note  08/12/2019 9:55 AM Candice [name]  MRN:  [id number]  Chief Complaint:  Chief Complaint    Follow-up     HPI:   Candice [name] is a 33 year old Caucasian female, employed, lives in [place], has a history of depression, panic attacks, bilateral hearing loss, migraine headache was evaluated by telemedicine today.  Patient today reports she continues to feel nervous all the time.  Her anxiety symptoms are not worsening however she has not noticed much benefit from the BuSpar yet.  She started taking BuSpar 7.5 mg after her last visit with writer and a week ago increased it to 7.5 mg twice a day.  She denies any vivid dreams or sleep problems at this time from the BuSpar.  Patient reports work continues to be overwhelming.  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  She also reports her daughter is currently having health issues, repeat EEG showed abnormality and her providers are currently working on getting her on the right medications.  Patient reports that is overwhelming and stressful for her.  Patient reports she has not been able to start psychotherapy sessions yet.  She  however agrees to give [facility] a call.  Patient denies any suicidality, homicidality or perceptual disturbances.  Patient denies any other concerns today.      Visit Diagnosis:    ICD-10-CM   1. MDD (major depressive disorder), recurrent, in partial remission (HCC)  F33.41 busPIRone (BUSPAR) 15 MG tablet  2. Panic attacks  F41.0 busPIRone (BUSPAR) 15 MG tablet  3. Insomnia, unspecified type  G47.00     Past Psychiatric History: Reviewed past psychiatric history from my progress note on 01/10/2019.  Past Medical History:  Past Medical History:  Diagnosis Date  . Anxiety   . Depression   . Hearing loss   . Migraines    a. x 10+ years.  . Postpartum care following cesarean delivery (5/29) 02/04/2014  . Pre-syncope    a. 07/2015 [facility] ED visit, ? vasovagal, improved with IVF.  [name] Short cervix in second trimester, antepartum 11/16/2013    Past Surgical History:  Procedure Laterality Date  . CESAREAN SECTION N/A 02/03/2014   Procedure: Primary Cesarean Section Delivery Baby Girl @ 2244, Apgars 9/9;  Surgeon: Serita Kyle, MD;  Location: [facility];  Service: Obstetrics;  Laterality: N/A;  . CESAREAN SECTION    . WISDOM TOOTH EXTRACTION  2002    Family Psychiatric History: Reviewed family psychiatric history from my progress note on 01/10/2019.  Family History:  Family History  Problem Relation Age of Onset  . Depression Mother   . Anxiety disorder Mother   . Heart attack Mother  NSTEMI @ age 35.  [name]. Alcohol abuse Paternal Grandfather   . Sleep apnea Father        alive @ 3456.  [name]. Heart attack Maternal Grandfather   . Anxiety disorder Maternal Grandmother   . Depression Maternal Grandmother   . Diabetes Maternal Grandmother   . Hypertension Maternal Grandmother     Social History: Reviewed social history from my progress note on 01/10/2019. Social History   Socioeconomic History  . Marital status: Married    Spouse name: Not on file  . Number of children: 1  .  Years of education: Not on file  . Highest education level: Not on file  Occupational History  . Not on file  Social Needs  . Financial resource strain: Not hard at all  . Food insecurity    Worry: Never true    Inability: Never true  . Transportation needs    Medical: No    Non-medical: No  Tobacco Use  . Smoking status: Never Smoker  . Smokeless tobacco: Never Used  Substance and Sexual Activity  . Alcohol use: Yes    Alcohol/week: 0.0 standard drinks    Comment: maybe two drinks/month.  . Drug use: No  . Sexual activity: Yes    Birth control/protection: Implant  Lifestyle  . Physical activity    Days per week: 0 days    Minutes per session: 0 min  . Stress: Not on file  Relationships  . Social Musicianconnections    Talks on phone: Not on file    Gets together: Not on file    Attends religious service: Not on file    Active member of club or organization: Not on file    Attends meetings of clubs or organizations: Not on file    Relationship status: Married  Other Topics Concern  . Not on file  Social History Narrative   Lives in [place] with her husband and 7917 month old dtr.  Does not routinely exercise.        Allergies:  Allergies  Allergen Reactions  . Sulfa Antibiotics Other (See Comments)  . Sulfur Hives    Metabolic Disorder Labs: No results found for: HGBA1C, MPG No results found for: PROLACTIN No results found for: CHOL, TRIG, HDL, CHOLHDL, VLDL, LDLCALC Lab Results  Component Value Date   TSH 0.696 07/12/2015    Therapeutic Level Labs: No results found for: LITHIUM No results found for: VALPROATE No components found for:  CBMZ  Current Medications: Current Outpatient Medications  Medication Sig Dispense Refill  . ALPRAZolam (XANAX) 0.25 MG tablet Take 1 tablet (0.25 mg total) by mouth daily as needed for anxiety. Only for severe anxiety attacks 12 tablet 1  . busPIRone (BUSPAR) 15 MG tablet Take 1 tablet (15 mg total) by mouth 2 (two) times  daily. 60 tablet 1  . cyclobenzaprine (FLEXERIL) 10 MG tablet TAKE 1 TABLET BY MOUTH EVERY 8 HOURS AS NEEDED FOR MUSCLE SPASMS. 30 tablet 1  . eletriptan (RELPAX) 40 MG tablet TAKE 1 TABLET BY MOUTH AS NEEDED FOR MIGRAINE OR HEADACHE. MAY REPEAT IN 2 HOURS IF HEADACHE PERSISTS OR RECURS. 10 tablet 11  . escitalopram (LEXAPRO) 20 MG tablet Take 1 tablet (20 mg total) by mouth daily. 90 tablet 1  . fluticasone (FLONASE) 50 MCG/ACT nasal spray Place 2 sprays into both nostrils daily. (Patient not taking: Reported on 11/05/2018) 16 g 0  . HYDROcodone-acetaminophen (NORCO) 10-325 MG tablet Take 1 tablet by mouth every 6 (six) hours as  needed. 30 tablet 0  . ibuprofen (ADVIL) 800 MG tablet Take 1 tablet (800 mg total) by mouth every 8 (eight) hours as needed. 60 tablet 1  . ibuprofen (ADVIL) 800 MG tablet     . levonorgestrel-ethinyl estradiol (KURVELO) 0.15-30 MG-MCG tablet Take 1 tablet by mouth daily. Active pills only 3 Package 5  . levonorgestrel-ethinyl estradiol (KURVELO) 0.15-30 MG-MCG tablet Take 1 tablet by mouth daily. 1 Package 11  . meclizine (ANTIVERT) 12.5 MG tablet Take 1 tablet (12.5 mg total) by mouth 3 (three) times daily as needed for dizziness. 30 tablet 0  . metoprolol tartrate (LOPRESSOR) 25 MG tablet Take 2 tablets (50 mg total) by mouth 2 (two) times daily. (Patient not taking: Reported on 07/14/2019) 60 tablet 3  . naproxen sodium (ANAPROX DS) 550 MG tablet Take 1 tablet (550 mg total) by mouth as needed for headache. 60 tablet 1  . NURTEC 75 MG TBDP     . ondansetron (ZOFRAN-ODT) 4 MG disintegrating tablet DISSOLVE 1 TABLET BY MOUTH EVERY 8 (EIGHT) HOURS AS NEEDED FOR NAUSEA OR VOMITING. 20 tablet 6  . promethazine (PHENERGAN) 25 MG suppository Place 1 suppository (25 mg total) rectally every 6 (six) hours as needed for nausea or vomiting. 12 each 0  . promethazine (PHENERGAN) 25 MG tablet Take 1 tablet (25 mg total) by mouth every 8 (eight) hours as needed for nausea or vomiting.  30 tablet 0  . SUMAtriptan (IMITREX) 100 MG tablet Take 1 tablet (100 mg total) by mouth as needed. (Patient not taking: Reported on 07/14/2019) 12 tablet 11  . SUMAtriptan 6 MG/0.5ML SOAJ Inject 1 cartridge into the skin as needed (migraine). May repeat in 2 hours.  No more than 2 doses of Imitrex per day. 12 Cartridge 3  . valACYclovir (VALTREX) 1000 MG tablet Take 2 tablets (2,000 mg total) by mouth 2 (two) times daily. 4 tablet 0  . zolmitriptan (ZOMIG) 5 MG nasal solution Place 1 spray into the nose as needed for migraine. (Patient not taking: Reported on 07/14/2019) 6 Units 5   No current facility-administered medications for this visit.      Musculoskeletal: Strength & Muscle Tone: UTA Gait & Station: normal Patient leans: N/A  Psychiatric Specialty Exam: Review of Systems  Psychiatric/Behavioral: The patient is nervous/anxious.   All other systems reviewed and are negative.   There were no vitals taken for this visit.There is no height or weight on file to calculate BMI.  General Appearance: Casual  Eye Contact:  Fair  Speech:  Clear and Coherent  Volume:  Normal  Mood:  Anxious  Affect:  Congruent  Thought Process:  Goal Directed and Descriptions of Associations: Intact  Orientation:  Full (Time, Place, and Person)  Thought Content: Logical   Suicidal Thoughts:  No  Homicidal Thoughts:  No  Memory:  Immediate;   Fair Recent;   Fair Remote;   Fair  Judgement:  Fair  Insight:  Fair  Psychomotor Activity:  Normal  Concentration:  Concentration: Fair and Attention Span: Fair  Recall:  [employer] of Knowledge: Fair  Language: Fair  Akathisia:  No  Handed:  Right  AIMS (if indicated): Denies tremors, rigidity  Assets:  Communication Skills Desire for Improvement Housing Social Support Talents/Skills Transportation Vocational/Educational  ADL's:  Intact  Cognition: WNL  Sleep:  Fair   Screenings:   Assessment and Plan: Delynda is a 33 year old Caucasian  female, employed, married, has a history of depression, anxiety, was evaluated by telemedicine today.  She has psychosocial stressors of relationship struggles, work-related problems as well as her daughter's health issues.  Patient continues to struggle with anxiety and will benefit from medication readjustment as well as psychotherapy sessions.  Plan as noted below.  Plan MDD in remission Lexapro 20 mg p.o. daily  Anxiety attacks-unstable Xanax 0.25 mg p.o. as needed for severe anxiety attacks.  Patient has been limiting use.  She uses it 1-2 times a week only. I have reviewed [place] controlled substance database. Increase BuSpar to 15 mg p.o. daily.  Insomnia unspecified-improving We will continue to monitor closely. Patient was advised to take BuSpar dosage earlier.  Follow-up in clinic in 4 weeks or sooner if needed.  January 7 at 4:20 PM  I have spent atleast 15 minutes non face to face with patient today. More than 50 % of the time was spent for psychoeducation and supportive psychotherapy and care coordination. This note was generated in part or whole with voice recognition software. Voice recognition is usually quite accurate but there are transcription errors that can and very often do occur. I apologize for any typographical errors that were not detected and corrected.       Jomarie Longs, MD 08/12/2019, 9:55 AM

## 2019-08-12 ENCOUNTER — Ambulatory Visit (INDEPENDENT_AMBULATORY_CARE_PROVIDER_SITE_OTHER): Payer: 59 | Admitting: Psychiatry

## 2019-08-12 ENCOUNTER — Encounter: Payer: Self-pay | Admitting: Psychiatry

## 2019-08-12 ENCOUNTER — Other Ambulatory Visit: Payer: Self-pay

## 2019-08-12 DIAGNOSIS — F410 Panic disorder [episodic paroxysmal anxiety] without agoraphobia: Secondary | ICD-10-CM

## 2019-08-12 DIAGNOSIS — F3341 Major depressive disorder, recurrent, in partial remission: Secondary | ICD-10-CM

## 2019-08-12 DIAGNOSIS — G4700 Insomnia, unspecified: Secondary | ICD-10-CM | POA: Diagnosis not present

## 2019-08-12 MED ORDER — BUSPIRONE HCL 15 MG PO TABS: 15.0000 mg | ORAL_TABLET | Freq: Two times a day (BID) | ORAL | 1 refills | Status: DC

## 2019-09-05 ENCOUNTER — Other Ambulatory Visit: Payer: Self-pay | Admitting: Physician Assistant

## 2019-09-08 ENCOUNTER — Telehealth: Payer: 59 | Admitting: Emergency Medicine

## 2019-09-08 DIAGNOSIS — B001 Herpesviral vesicular dermatitis: Secondary | ICD-10-CM | POA: Diagnosis not present

## 2019-09-08 DIAGNOSIS — B009 Herpesviral infection, unspecified: Secondary | ICD-10-CM

## 2019-09-08 MED ORDER — VALACYCLOVIR HCL 1 G PO TABS: 2000.0000 mg | ORAL_TABLET | Freq: Two times a day (BID) | ORAL | 1 refills | Status: DC

## 2019-09-08 NOTE — Progress Notes (Signed)
We are sorry that you are not feeling well.  Here is how we plan to help!  Based on what you have shared with me it does look like you have a viral infection.    Most cold sores or fever blisters are small fluid filled blisters around the mouth caused by herpes simplex virus.  The most common strain of the virus causing cold sores is herpes simplex virus 1.  It can be spread by skin contact, sharing eating utensils, or even sharing towels.  Cold sores are contagious to other people until dry. (Approximately 5-7 days).  Wash your hands. You can spread the virus to your eyes through handling your contact lenses after touching the lesions.  Most people experience pain at the sight or tingling sensations in their lips that may begin before the ulcers erupt.  Herpes simplex is treatable but not curable.  It may lie dormant for a long time and then reappear due to stress or prolonged sun exposure.  Many patients have success in treating their cold sores with an over the counter topical called Abreva.  You may apply the cream up to 5 times daily (maximum 10 days) until healing occurs.  If you would like to use an oral antiviral medication to speed the healing of your cold sore, I have sent a prescription to your local pharmacy Valacyclovir 2 gm take one by mouth twice a day for 1 day.  I gave you one refill.    HOME CARE:   Wash your hands frequently.  Do not pick at or rub the sore.  Don't open the blisters.  Avoid kissing other people during this time.  Avoid sharing drinking glasses, eating utensils, or razors.  Do not handle contact lenses unless you have thoroughly washed your hands with soap and warm water!  Avoid oral sex during this time.  Herpes from sores on your mouth can spread to your partner's genital area.  Avoid contact with anyone who has eczema or a weakened immune system.  Cold sores are often triggered by exposure to intense sunlight, use a lip balm containing a sunscreen  (SPF 30 or higher).  GET HELP RIGHT AWAY IF:   Blisters look infected.  Blisters occur near or in the eye.  Symptoms last longer than 10 days.  Your symptoms become worse.  MAKE SURE YOU:   Understand these instructions.  Will watch your condition.  Will get help right away if you are not doing well or get worse.    Your e-visit answers were reviewed by a board certified advanced clinical practitioner to complete your personal care plan.  Depending upon the condition, your plan could have  Included both over the counter or prescription medications.    Please review your pharmacy choice.  Be sure that the pharmacy you have chosen is open so that you can pick up your prescription now.  If there is a problem you can message your provider in [place] to have the prescription routed to another pharmacy.    Your safety is important to [place].  If you have drug allergies check our prescription carefully.  For the next 24 hours you can use MyChart to ask questions about today's visit, request a non-urgent call back, or ask for a work or school excuse from your e-visit provider.  You will get an email in the next two days asking about your experience.  I hope that your e-visit has been valuable and will speed your recovery.  Approximately  in reviewing the patient's chart, questionnaire, prescribing medications, and documentation.   

## 2019-09-15 ENCOUNTER — Ambulatory Visit: Payer: 59 | Admitting: Psychiatry

## 2019-10-12 ENCOUNTER — Encounter: Payer: Self-pay | Admitting: Psychiatry

## 2019-10-12 ENCOUNTER — Other Ambulatory Visit: Payer: Self-pay

## 2019-10-12 ENCOUNTER — Ambulatory Visit (INDEPENDENT_AMBULATORY_CARE_PROVIDER_SITE_OTHER): Payer: 59 | Admitting: Psychiatry

## 2019-10-12 DIAGNOSIS — F410 Panic disorder [episodic paroxysmal anxiety] without agoraphobia: Secondary | ICD-10-CM

## 2019-10-12 DIAGNOSIS — F3341 Major depressive disorder, recurrent, in partial remission: Secondary | ICD-10-CM | POA: Diagnosis not present

## 2019-10-12 DIAGNOSIS — G4700 Insomnia, unspecified: Secondary | ICD-10-CM | POA: Diagnosis not present

## 2019-10-12 MED ORDER — BUSPIRONE HCL 15 MG PO TABS: 15.0000 mg | ORAL_TABLET | Freq: Two times a day (BID) | ORAL | 1 refills | Status: DC

## 2019-10-12 MED ORDER — ESCITALOPRAM OXALATE 20 MG PO TABS: 20.0000 mg | ORAL_TABLET | Freq: Every day | ORAL | 1 refills | Status: DC

## 2019-10-12 NOTE — Progress Notes (Signed)
Provider Location : [facility] Patient Location : Work   Virtual Visit via Video Note  I connected with Candice [name] on 10/12/19 at  4:30 PM EST by a video enabled telemedicine application and verified that I am speaking with the correct person using two identifiers.   I discussed the limitations of evaluation and management by telemedicine and the availability of in person appointments. The patient expressed understanding and agreed to proceed.    I discussed the assessment and treatment plan with the patient. The patient was provided an opportunity to ask questions and all were answered. The patient agreed with the plan and demonstrated an understanding of the instructions.   The patient was advised to call back or seek an in-person evaluation if the symptoms worsen or if the condition fails to improve as anticipated.   [facility] MD OP Progress Note  10/12/2019 5:24 PM Candice [name]  MRN:  [id number]  Chief Complaint:  Chief Complaint    Follow-up     HPI: Candice [name] is a 34 year old Caucasian female, employed, lives in [place], has a history of depression, panic attacks, bilateral hearing loss, migraine headache was evaluated by telemedicine today.  Patient today reports she is currently struggling with a lot of relationship struggles with her husband.  She reports she and her husband have decided to separate however he does not have a place to stay and hence he will be staying with her for the next 6 months or so until his place is ready.  She reports mood wise she seems to be doing okay on the current medication regimen except for her relationship struggles which can make her overwhelmed on and off.  She however has been coping okay.  She is compliant on the Lexapro and the BuSpar as prescribed.  She does report possible sexual side effects to Lexapro.  She however reports she is not ready to change her medications yet.  She has tried Wellbutrin previously which did not help.  She  reports sleep and appetite is fair.  She reports she is not ready to start psychotherapy sessions since she is not ready to go and talk to anyone about her relationship problems yet.  She reports she has been talking to her parents which has been helpful.  Patient denies any suicidality, homicidality or perceptual disturbances.     Visit Diagnosis:    ICD-10-CM   1. MDD (major depressive disorder), recurrent, in partial remission (HCC)  F33.41 busPIRone (BUSPAR) 15 MG tablet    escitalopram (LEXAPRO) 20 MG tablet  2. Panic attacks  F41.0 busPIRone (BUSPAR) 15 MG tablet  3. Insomnia, unspecified type  G47.00     Past Psychiatric History: I have reviewed past psychiatric history from my progress note on 01/10/2019.  Past Medical History:  Past Medical History:  Diagnosis Date  . Anxiety   . Depression   . Hearing loss   . Migraines    a. x 10+ years.  . Postpartum care following cesarean delivery (5/29) 02/04/2014  . Pre-syncope    a. 07/2015 [facility] ED visit, ? vasovagal, improved with IVF.  [name] Short cervix in second trimester, antepartum 11/16/2013    Past Surgical History:  Procedure Laterality Date  . CESAREAN SECTION N/A 02/03/2014   Procedure: Primary Cesarean Section Delivery Baby Girl @ 2244, Apgars 9/9;  Surgeon: Serita Kyle, MD;  Location: [facility];  Service: Obstetrics;  Laterality: N/A;  . CESAREAN SECTION    . WISDOM TOOTH EXTRACTION  2002  Family Psychiatric History: Reviewed family psychiatric history from my progress note on 01/10/2019.  Family History:  Family History  Problem Relation Age of Onset  . Depression Mother   . Anxiety disorder Mother   . Heart attack Mother        NSTEMI @ age 15.  [name] Alcohol abuse Paternal Grandfather   . Sleep apnea Father        alive @ 50.  [name] Heart attack Maternal Grandfather   . Anxiety disorder Maternal Grandmother   . Depression Maternal Grandmother   . Diabetes Maternal Grandmother   . Hypertension Maternal  Grandmother     Social History: I have reviewed social history from my progress note on 01/10/2019. Social History   Socioeconomic History  . Marital status: Married    Spouse name: Not on file  . Number of children: 1  . Years of education: Not on file  . Highest education level: Not on file  Occupational History  . Not on file  Tobacco Use  . Smoking status: Never Smoker  . Smokeless tobacco: Never Used  Substance and Sexual Activity  . Alcohol use: Yes    Alcohol/week: 0.0 standard drinks    Comment: maybe two drinks/month.  . Drug use: No  . Sexual activity: Yes    Birth control/protection: Implant  Other Topics Concern  . Not on file  Social History Narrative   Lives in [place] with her husband and 16 month old dtr.  Does not routinely exercise.       Social Determinants of Health   Financial Resource Strain:   . Difficulty of Paying Living Expenses: Not on file  Food Insecurity:   . Worried About Charity fundraiser in the Last Year: Not on file  . Ran Out of Food in the Last Year: Not on file  Transportation Needs:   . Lack of Transportation (Medical): Not on file  . Lack of Transportation (Non-Medical): Not on file  Physical Activity:   . Days of Exercise per Week: Not on file  . Minutes of Exercise per Session: Not on file  Stress:   . Feeling of Stress : Not on file  Social Connections:   . Frequency of Communication with Friends and Family: Not on file  . Frequency of Social Gatherings with Friends and Family: Not on file  . Attends Religious Services: Not on file  . Active Member of Clubs or Organizations: Not on file  . Attends Archivist Meetings: Not on file  . Marital Status: Not on file    Allergies:  Allergies  Allergen Reactions  . Sulfa Antibiotics Other (See Comments)  . Sulfur Hives    Metabolic Disorder Labs: No results found for: HGBA1C, MPG No results found for: PROLACTIN No results found for: CHOL, TRIG, HDL, CHOLHDL,  VLDL, LDLCALC Lab Results  Component Value Date   TSH 0.696 07/12/2015    Therapeutic Level Labs: No results found for: LITHIUM No results found for: VALPROATE No components found for:  CBMZ  Current Medications: Current Outpatient Medications  Medication Sig Dispense Refill  . ALPRAZolam (XANAX) 0.25 MG tablet Take 1 tablet (0.25 mg total) by mouth daily as needed for anxiety. Only for severe anxiety attacks 12 tablet 1  . busPIRone (BUSPAR) 15 MG tablet Take 1 tablet (15 mg total) by mouth 2 (two) times daily. 180 tablet 1  . cyclobenzaprine (FLEXERIL) 10 MG tablet TAKE 1 TABLET BY MOUTH EVERY 8 HOURS AS NEEDED FOR MUSCLE  SPASMS. 30 tablet 1  . eletriptan (RELPAX) 40 MG tablet TAKE 1 TABLET BY MOUTH AS NEEDED FOR MIGRAINE OR HEADACHE. MAY REPEAT IN 2 HOURS IF HEADACHE PERSISTS OR RECURS. 10 tablet 11  . escitalopram (LEXAPRO) 20 MG tablet Take 1 tablet (20 mg total) by mouth daily. 90 tablet 1  . fluticasone (FLONASE) 50 MCG/ACT nasal spray Place 2 sprays into both nostrils daily. (Patient not taking: Reported on 11/05/2018) 16 g 0  . HYDROcodone-acetaminophen (NORCO) 10-325 MG tablet Take 1 tablet by mouth every 6 (six) hours as needed. 30 tablet 0  . ibuprofen (ADVIL) 800 MG tablet Take 1 tablet (800 mg total) by mouth every 8 (eight) hours as needed. 60 tablet 1  . ibuprofen (ADVIL) 800 MG tablet     . levonorgestrel-ethinyl estradiol (KURVELO) 0.15-30 MG-MCG tablet Take 1 tablet by mouth daily. Active pills only 3 Package 5  . levonorgestrel-ethinyl estradiol (KURVELO) 0.15-30 MG-MCG tablet Take 1 tablet by mouth daily. 1 Package 11  . meclizine (ANTIVERT) 12.5 MG tablet Take 1 tablet (12.5 mg total) by mouth 3 (three) times daily as needed for dizziness. 30 tablet 0  . metoprolol tartrate (LOPRESSOR) 25 MG tablet Take 2 tablets (50 mg total) by mouth 2 (two) times daily. (Patient not taking: Reported on 07/14/2019) 60 tablet 3  . naproxen sodium (ANAPROX DS) 550 MG tablet Take 1  tablet (550 mg total) by mouth as needed for headache. 60 tablet 1  . NURTEC 75 MG TBDP TAKE 1 TABLET BY MOUTH ONCE DAILY AS NEEDED. 8 tablet 2  . ondansetron (ZOFRAN-ODT) 4 MG disintegrating tablet DISSOLVE 1 TABLET BY MOUTH EVERY 8 (EIGHT) HOURS AS NEEDED FOR NAUSEA OR VOMITING. 20 tablet 6  . promethazine (PHENERGAN) 25 MG suppository Place 1 suppository (25 mg total) rectally every 6 (six) hours as needed for nausea or vomiting. 12 each 0  . promethazine (PHENERGAN) 25 MG tablet Take 1 tablet (25 mg total) by mouth every 8 (eight) hours as needed for nausea or vomiting. 30 tablet 0  . SUMAtriptan (IMITREX) 100 MG tablet Take 1 tablet (100 mg total) by mouth as needed. (Patient not taking: Reported on 07/14/2019) 12 tablet 11  . SUMAtriptan 6 MG/0.5ML SOAJ Inject 1 cartridge into the skin as needed (migraine). May repeat in 2 hours.  No more than 2 doses of Imitrex per day. 12 Cartridge 3  . valACYclovir (VALTREX) 1000 MG tablet Take 2 tablets (2,000 mg total) by mouth 2 (two) times daily. 4 tablet 1  . zolmitriptan (ZOMIG) 5 MG nasal solution Place 1 spray into the nose as needed for migraine. (Patient not taking: Reported on 07/14/2019) 6 Units 5   No current facility-administered medications for this visit.     Musculoskeletal: Strength & Muscle Tone: UTA Gait & Station: normal Patient leans: N/A  Psychiatric Specialty Exam: Review of Systems  Psychiatric/Behavioral: The patient is nervous/anxious.   All other systems reviewed and are negative.   There were no vitals taken for this visit.There is no height or weight on file to calculate BMI.  General Appearance: Casual  Eye Contact:  Fair  Speech:  Clear and Coherent  Volume:  Normal  Mood:  Anxious coping ok  Affect:  Appropriate  Thought Process:  Goal Directed and Descriptions of Associations: Intact  Orientation:  Full (Time, Place, and Person)  Thought Content: Logical   Suicidal Thoughts:  No  Homicidal Thoughts:  No   Memory:  Immediate;   Fair Recent;   Fair  Remote;   Fair  Judgement:  Fair  Insight:  Fair  Psychomotor Activity:  Normal  Concentration:  Concentration: Fair and Attention Span: Fair  Recall:  [employer] of Knowledge: Fair  Language: Fair  Akathisia:  No  Handed:  Right  AIMS (if indicated): Denies tremors, rigidity  Assets:  Communication Skills Desire for Improvement Housing Social Support  ADL's:  Intact  Cognition: WNL  Sleep:  Fair   Screenings:   Assessment and Plan: Paighton is a 34 year old Caucasian female, employed, married, history of depression, anxiety was evaluated by telemedicine today.  Patient has psychosocial stressors of relationship struggles, work-related problems as well as her daughter's health issues.  Patient is currently struggling with her relationship struggles with her husband however otherwise is doing okay.  Plan as noted below.  Plan MDD in remission Lexapro 20 mg p.o. daily  Anxiety attacks-stable Xanax 0.25 mg p.o. as needed 1-2 times a week only for severe panic attacks. I have reviewed [place] controlled substance database. BuSpar 15 mg p.o. bid  Insomnia unspecified-stable Patient to continue to work on sleep hygiene techniques.  Discussed referral for psychotherapy sessions again-patient is not ready yet.  Follow-up in clinic in 4 to 6 weeks or sooner if needed.  March 17 at 4:30 PM  I have spent atleast 20 minutes non face to face with patient today. More than 50 % of the time was spent for preparing to see the patient ( e.g., review of test, records ), obtaining and to review and separately obtained history , ordering medications and test ,psychoeducation and supportive psychotherapy and care coordination,as well as documenting clinical information in electronic health record,interpreting results of test and communication of results This note was generated in part or whole with voice recognition software. Voice recognition is usually  quite accurate but there are transcription errors that can and very often do occur. I apologize for any typographical errors that were not detected and corrected.       Jomarie Longs, MD 10/12/2019, 5:24 PM

## 2019-10-20 ENCOUNTER — Other Ambulatory Visit: Payer: Self-pay | Admitting: Physician Assistant

## 2019-11-23 ENCOUNTER — Ambulatory Visit: Payer: 59 | Admitting: Psychiatry

## 2019-11-24 ENCOUNTER — Ambulatory Visit: Payer: 59 | Admitting: Psychiatry

## 2019-12-06 DIAGNOSIS — H43393 Other vitreous opacities, bilateral: Secondary | ICD-10-CM | POA: Diagnosis not present

## 2019-12-06 DIAGNOSIS — H40013 Open angle with borderline findings, low risk, bilateral: Secondary | ICD-10-CM | POA: Diagnosis not present

## 2019-12-06 DIAGNOSIS — H5213 Myopia, bilateral: Secondary | ICD-10-CM | POA: Diagnosis not present

## 2019-12-13 DIAGNOSIS — Z0000 Encounter for general adult medical examination without abnormal findings: Secondary | ICD-10-CM | POA: Diagnosis not present

## 2019-12-13 DIAGNOSIS — R000 Tachycardia, unspecified: Secondary | ICD-10-CM | POA: Diagnosis not present

## 2019-12-13 DIAGNOSIS — F419 Anxiety disorder, unspecified: Secondary | ICD-10-CM | POA: Diagnosis not present

## 2019-12-13 DIAGNOSIS — F331 Major depressive disorder, recurrent, moderate: Secondary | ICD-10-CM | POA: Diagnosis not present

## 2019-12-14 ENCOUNTER — Other Ambulatory Visit: Payer: Self-pay | Admitting: Physician Assistant

## 2019-12-15 ENCOUNTER — Other Ambulatory Visit: Payer: Self-pay | Admitting: Physician Assistant

## 2019-12-19 ENCOUNTER — Encounter: Payer: Self-pay | Admitting: *Deleted

## 2019-12-20 ENCOUNTER — Encounter: Payer: Self-pay | Admitting: Psychiatry

## 2019-12-20 ENCOUNTER — Ambulatory Visit (INDEPENDENT_AMBULATORY_CARE_PROVIDER_SITE_OTHER): Payer: 59 | Admitting: Psychiatry

## 2019-12-20 ENCOUNTER — Other Ambulatory Visit: Payer: Self-pay

## 2019-12-20 DIAGNOSIS — G4700 Insomnia, unspecified: Secondary | ICD-10-CM | POA: Diagnosis not present

## 2019-12-20 DIAGNOSIS — F3342 Major depressive disorder, recurrent, in full remission: Secondary | ICD-10-CM

## 2019-12-20 DIAGNOSIS — F410 Panic disorder [episodic paroxysmal anxiety] without agoraphobia: Secondary | ICD-10-CM | POA: Diagnosis not present

## 2019-12-20 NOTE — Progress Notes (Signed)
Provider Location : [facility] Patient Location : Work  Virtual Visit via Video Note  I connected with Candice [name] on 12/20/19 at  4:00 PM EDT by a video enabled telemedicine application and verified that I am speaking with the correct person using two identifiers.   I discussed the limitations of evaluation and management by telemedicine and the availability of in person appointments. The patient expressed understanding and agreed to proceed.    I discussed the assessment and treatment plan with the patient. The patient was provided an opportunity to ask questions and all were answered. The patient agreed with the plan and demonstrated an understanding of the instructions.   The patient was advised to call back or seek an in-person evaluation if the symptoms worsen or if the condition fails to improve as anticipated.  BH MD OP Progress Note  12/20/2019 5:04 PM Candice [name]  MRN:  [id number]  Chief Complaint:  Chief Complaint    Follow-up     HPI: Candice [name] is a 34 year old Caucasian female, employed, lives in [place], has a history of depression, panic attacks, bilateral hearing loss, migraine headache was evaluated by telemedicine today.  Patient today reports she continues to struggle with her relationship with her husband.  She reports she and her husband have been trying to work on the relationship however they continue to have good days and bad days.  Her husband struggles with mental health issues which is also another stressor.  She is not sure if he is compliant on medications or not.  She reports she is compliant on the Lexapro for her anxiety.  She does have on and off anxiety symptoms which are more situational.  She reports she was recently started on propranolol.  She was taken off of the metoprolol by her primary care provider.  She wonders whether she can get off of the BuSpar since propranolol might help with anxiety also.  Patient reports sleep is good.  Patient  denies any suicidality, homicidality or perceptual disturbances.  Patient denies any other concerns today.   Visit Diagnosis:    ICD-10-CM   1. MDD (major depressive disorder), recurrent, in full remission (HCC)  F33.42   2. Panic attacks  F41.0   3. Insomnia, unspecified type  G47.00     Past Psychiatric History: Reviewed past psychiatric history from my progress note on 01/10/2019  Past Medical History:  Past Medical History:  Diagnosis Date  . Anxiety   . Depression   . Hearing loss   . Migraines    a. x 10+ years.  . Postpartum care following cesarean delivery (5/29) 02/04/2014  . Pre-syncope    a. 07/2015 [facility] ED visit, ? vasovagal, improved with IVF.  [name] Short cervix in second trimester, antepartum 11/16/2013    Past Surgical History:  Procedure Laterality Date  . CESAREAN SECTION N/A 02/03/2014   Procedure: Primary Cesarean Section Delivery Baby Girl @ 2244, Apgars 9/9;  Surgeon: Serita Kyle, MD;  Location: [facility];  Service: Obstetrics;  Laterality: N/A;  . CESAREAN SECTION    . WISDOM TOOTH EXTRACTION  2002    Family Psychiatric History: Reviewed family psychiatric history from my progress note on 01/10/2019  Family History:  Family History  Problem Relation Age of Onset  . Depression Mother   . Anxiety disorder Mother   . Heart attack Mother        NSTEMI @ age 24.  [name] Alcohol abuse Paternal Grandfather   . Sleep apnea Father  alive @ 89.  [name] Heart attack Maternal Grandfather   . Anxiety disorder Maternal Grandmother   . Depression Maternal Grandmother   . Diabetes Maternal Grandmother   . Hypertension Maternal Grandmother     Social History: Reviewed social history from my progress note on 01/10/2019 Social History   Socioeconomic History  . Marital status: Married    Spouse name: Not on file  . Number of children: 1  . Years of education: Not on file  . Highest education level: Not on file  Occupational History  . Not on file  Tobacco  Use  . Smoking status: Never Smoker  . Smokeless tobacco: Never Used  Substance and Sexual Activity  . Alcohol use: Yes    Alcohol/week: 0.0 standard drinks    Comment: maybe two drinks/month.  . Drug use: No  . Sexual activity: Yes    Birth control/protection: Implant  Other Topics Concern  . Not on file  Social History Narrative   Lives in [place] with her husband and 15 month old dtr.  Does not routinely exercise.       Social Determinants of Health   Financial Resource Strain:   . Difficulty of Paying Living Expenses:   Food Insecurity:   . Worried About Programme researcher, broadcasting/film/video in the Last Year:   . Barista in the Last Year:   Transportation Needs:   . Freight forwarder (Medical):   [name] Lack of Transportation (Non-Medical):   Physical Activity:   . Days of Exercise per Week:   . Minutes of Exercise per Session:   Stress:   . Feeling of Stress :   Social Connections:   . Frequency of Communication with Friends and Family:   . Frequency of Social Gatherings with Friends and Family:   . Attends Religious Services:   . Active Member of Clubs or Organizations:   . Attends Banker Meetings:   [name] Marital Status:     Allergies:  Allergies  Allergen Reactions  . Sulfa Antibiotics Other (See Comments) and Hives  . Sulfur Hives    Metabolic Disorder Labs: No results found for: HGBA1C, MPG No results found for: PROLACTIN No results found for: CHOL, TRIG, HDL, CHOLHDL, VLDL, LDLCALC Lab Results  Component Value Date   TSH 0.696 07/12/2015    Therapeutic Level Labs: No results found for: LITHIUM No results found for: VALPROATE No components found for:  CBMZ  Current Medications: Current Outpatient Medications  Medication Sig Dispense Refill  . ALPRAZolam (XANAX) 0.25 MG tablet Take 1 tablet (0.25 mg total) by mouth daily as needed for anxiety. Only for severe anxiety attacks 12 tablet 1  . cyclobenzaprine (FLEXERIL) 10 MG tablet TAKE 1 TABLET  BY MOUTH EVERY 8 HOURS AS NEEDED FOR MUSCLE SPASMS. 30 tablet 1  . eletriptan (RELPAX) 40 MG tablet TAKE 1 TABLET BY MOUTH AS NEEDED FOR MIGRAINE OR HEADACHE. MAY REPEAT IN 2 HOURS IF HEADACHE PERSISTS OR RECURS. 10 tablet 11  . escitalopram (LEXAPRO) 20 MG tablet Take 1 tablet (20 mg total) by mouth daily. 90 tablet 1  . fluticasone (FLONASE) 50 MCG/ACT nasal spray Place 2 sprays into both nostrils daily. 16 g 0  . HYDROcodone-acetaminophen (NORCO) 10-325 MG tablet Take 1 tablet by mouth every 6 (six) hours as needed. 30 tablet 0  . ibuprofen (ADVIL) 800 MG tablet     . ibuprofen (ADVIL) 800 MG tablet TAKE 1 TABLET BY MOUTH EVERY 8 HOURS AS NEEDED. 60  tablet 1  . levonorgestrel-ethinyl estradiol (KURVELO) 0.15-30 MG-MCG tablet Take 1 tablet by mouth daily. Active pills only 3 Package 5  . levonorgestrel-ethinyl estradiol (KURVELO) 0.15-30 MG-MCG tablet Take 1 tablet by mouth daily. 1 Package 11  . naproxen sodium (ANAPROX DS) 550 MG tablet Take 1 tablet (550 mg total) by mouth as needed for headache. 60 tablet 1  . NURTEC 75 MG TBDP TAKE 1 TABLET BY MOUTH ONCE DAILY AS NEEDED. 8 tablet 2  . ondansetron (ZOFRAN-ODT) 4 MG disintegrating tablet DISSOLVE 1 TABLET BY MOUTH EVERY 8 (EIGHT) HOURS AS NEEDED FOR NAUSEA OR VOMITING. 20 tablet 6  . promethazine (PHENERGAN) 25 MG suppository Place 1 suppository (25 mg total) rectally every 6 (six) hours as needed for nausea or vomiting. 12 each 0  . promethazine (PHENERGAN) 25 MG tablet Take 1 tablet (25 mg total) by mouth every 8 (eight) hours as needed for nausea or vomiting. 30 tablet 0  . propranolol ER (INDERAL LA) 60 MG 24 hr capsule Take by mouth.    . SUMAtriptan (IMITREX) 100 MG tablet Take 1 tablet (100 mg total) by mouth as needed. 12 tablet 11  . SUMAtriptan 6 MG/0.5ML SOAJ Inject 1 cartridge into the skin as needed (migraine). May repeat in 2 hours.  No more than 2 doses of Imitrex per day. 12 Cartridge 3  . valACYclovir (VALTREX) 1000 MG tablet  Take 2 tablets (2,000 mg total) by mouth 2 (two) times daily. 4 tablet 1  . zolmitriptan (ZOMIG) 5 MG nasal solution Place 1 spray into the nose as needed for migraine. 6 Units 5  . meclizine (ANTIVERT) 12.5 MG tablet Take 1 tablet (12.5 mg total) by mouth 3 (three) times daily as needed for dizziness. (Patient not taking: Reported on 12/20/2019) 30 tablet 0   No current facility-administered medications for this visit.     Musculoskeletal: Strength & Muscle Tone: UTA Gait & Station: normal Patient leans: N/A  Psychiatric Specialty Exam: Review of Systems  Psychiatric/Behavioral: The patient is nervous/anxious.   All other systems reviewed and are negative.   There were no vitals taken for this visit.There is no height or weight on file to calculate BMI.  General Appearance: Casual  Eye Contact:  Fair  Speech:  Clear and Coherent  Volume:  Normal  Mood:  Anxious situational  Affect:  Congruent  Thought Process:  Goal Directed and Descriptions of Associations: Intact  Orientation:  Full (Time, Place, and Person)  Thought Content: Logical   Suicidal Thoughts:  No  Homicidal Thoughts:  No  Memory:  Immediate;   Fair Recent;   Fair Remote;   Fair  Judgement:  Fair  Insight:  Fair  Psychomotor Activity:  Normal  Concentration:  Concentration: Fair and Attention Span: Fair  Recall:  [employer] of Knowledge: Fair  Language: Fair  Akathisia:  No  Handed:  Right  AIMS (if indicated): UTA  Assets:  Communication Skills Desire for Improvement Housing Social Support  ADL's:  Intact  Cognition: WNL  Sleep:  Fair   Screenings:   Assessment and Plan: Candice [name] is a 34 year old Caucasian female, employed, married, has a history of depression, anxiety was evaluated by telemedicine today.  Patient with psychosocial stressors of relationship struggles, work-related problems as well as her daughter's health issues.  Patient is currently making progress.  Continue plan as noted  below.   Plan MDD in remission Lexapro 20 mg p.o. daily  Anxiety attacks-stable Xanax 0.25 mg p.o. as needed-1-2 times a  week for severe panic symptoms.  Patient has been limiting use.  She reports she may have used 3 to 4 pills in the past 2-1/2 months. Reviewed [place] controlled substance database Will discontinue BuSpar since she is currently on propranolol prescribed per primary care provider for sinus tachycardia.  Insomnia unspecified-stable Patient to continue to work on sleep hygiene techniques  I have discussed psychotherapy referral in the past however patient declined.  Discussed with patient again.  I have reviewed medical records in [facility] for Dr. Nemiah Commander dated 12/13/2019.  Follow-up in clinic in 6 weeks or sooner if needed.  I have spent atleast 20 minutes non face to face with patient today. More than 50 % of the time was spent for preparing to see the patient ( e.g., review of test, records ), obtaining and to review and separately obtained history , ordering medications and test ,psychoeducation and supportive psychotherapy and care coordination,as well as documenting clinical information in electronic health record. This note was generated in part or whole with voice recognition software. Voice recognition is usually quite accurate but there are transcription errors that can and very often do occur. I apologize for any typographical errors that were not detected and corrected.      Jomarie Longs, MD 12/20/2019, 5:04 PM

## 2019-12-20 NOTE — Patient Instructions (Signed)
Follow-up in clinic on June 2 at 4 PM

## 2019-12-21 DIAGNOSIS — Z0000 Encounter for general adult medical examination without abnormal findings: Secondary | ICD-10-CM | POA: Diagnosis not present

## 2020-01-18 ENCOUNTER — Other Ambulatory Visit: Payer: Self-pay | Admitting: Physician Assistant

## 2020-01-18 DIAGNOSIS — R6882 Decreased libido: Secondary | ICD-10-CM | POA: Diagnosis not present

## 2020-01-18 DIAGNOSIS — R5383 Other fatigue: Secondary | ICD-10-CM | POA: Diagnosis not present

## 2020-01-23 ENCOUNTER — Other Ambulatory Visit: Payer: Self-pay | Admitting: Internal Medicine

## 2020-02-08 ENCOUNTER — Encounter: Payer: Self-pay | Admitting: Psychiatry

## 2020-02-08 ENCOUNTER — Other Ambulatory Visit: Payer: Self-pay

## 2020-02-08 ENCOUNTER — Telehealth (INDEPENDENT_AMBULATORY_CARE_PROVIDER_SITE_OTHER): Payer: 59 | Admitting: Psychiatry

## 2020-02-08 DIAGNOSIS — F410 Panic disorder [episodic paroxysmal anxiety] without agoraphobia: Secondary | ICD-10-CM | POA: Diagnosis not present

## 2020-02-08 DIAGNOSIS — F3342 Major depressive disorder, recurrent, in full remission: Secondary | ICD-10-CM | POA: Diagnosis not present

## 2020-02-08 DIAGNOSIS — G4700 Insomnia, unspecified: Secondary | ICD-10-CM | POA: Diagnosis not present

## 2020-02-08 MED ORDER — ALPRAZOLAM 0.25 MG PO TABS: 0.2500 mg | ORAL_TABLET | Freq: Every day | ORAL | 1 refills | Status: DC | PRN

## 2020-02-08 MED ORDER — ESCITALOPRAM OXALATE 20 MG PO TABS: 20.0000 mg | ORAL_TABLET | Freq: Every day | ORAL | 0 refills | Status: DC

## 2020-02-08 NOTE — Progress Notes (Signed)
Provider Location : [facility] Patient Location : Home Virtual Visit via Telephone Note  I connected with Candice [name] on 02/08/20 at  4:00 PM EDT by telephone and verified that I am speaking with the correct person using two identifiers.   I discussed the limitations, risks, security and privacy concerns of performing an evaluation and management service by telephone and the availability of in person appointments. I also discussed with the patient that there may be a patient responsible charge related to this service. The patient expressed understanding and agreed to proceed.    I discussed the assessment and treatment plan with the patient. The patient was provided an opportunity to ask questions and all were answered. The patient agreed with the plan and demonstrated an understanding of the instructions.   The patient was advised to call back or seek an in-person evaluation if the symptoms worsen or if the condition fails to improve as anticipated.     BH MD OP Progress Note  02/08/2020 8:45 PM Candice [name]  MRN:  [id number]  Chief Complaint:  Chief Complaint    Follow-up     HPI: Candice [name] is a 34 year old Caucasian female, employed, lives in [place], has a history of depression, panic attacks, bilateral hearing loss, migraine headaches was evaluated by phone today.  A video call was attempted however due to connection problem it had to be changed to a phone call.  Patient today reports she is currently making progress with regards to her depression.  She denies any significant sadness.  She reports her medications as effective.  She denies any significant panic attacks. She continues to take xanax only for severe anxiety attacks..  She has been limiting use.  Patient reports she is currently living separated from her husband.  Her husband moved to an apartment that they own.  They continue to coparent their children.  Patient reports her husband is no longer interested  in family counseling since he believes they tried it in the past and it did not work.  Patient reports she has not been able to find an individual therapist for herself since she was busy.  She however is interested in probably starting therapy with [facility].  Patient continues to struggle with lack of energy and fatigue and wonders whether her medications could be contributing to it.  She would like to possibly come off of the Lexapro.  Patient denies any suicidality, homicidality or perceptual disturbances.  Patient denies any other concerns today.    Visit Diagnosis:    ICD-10-CM   1. MDD (major depressive disorder), recurrent, in full remission (HCC)  F33.42 escitalopram (LEXAPRO) 20 MG tablet  2. Panic attacks  F41.0 ALPRAZolam (XANAX) 0.25 MG tablet  3. Insomnia, unspecified type  G47.00     Past Psychiatric History: I have reviewed past psychiatric history from my progress note on 01/10/2019  Past Medical History:  Past Medical History:  Diagnosis Date   Anxiety    Depression    Hearing loss    Migraines    a. x 10+ years.   Postpartum care following cesarean delivery (5/29) 02/04/2014   Pre-syncope    a. 07/2015 [facility] ED visit, ? vasovagal, improved with IVF.   Short cervix in second trimester, antepartum 11/16/2013    Past Surgical History:  Procedure Laterality Date   CESAREAN SECTION N/A 02/03/2014   Procedure: Primary Cesarean Section Delivery Baby Girl @ 2244, Apgars 9/9;  Surgeon: Serita Kyle, MD;  Location: [facility]  ORS;  Service: Obstetrics;  Laterality: N/A;   CESAREAN SECTION     WISDOM TOOTH EXTRACTION  2002    Family Psychiatric History: Reviewed family psychiatric history from my progress note on 01/10/2019  Family History:  Family History  Problem Relation Age of Onset   Depression Mother    Anxiety disorder Mother    Heart attack Mother        NSTEMI @ age 55.   Alcohol abuse Paternal Grandfather    Sleep apnea Father        alive @ 45.    Heart attack Maternal Grandfather    Anxiety disorder Maternal Grandmother    Depression Maternal Grandmother    Diabetes Maternal Grandmother    Hypertension Maternal Grandmother     Social History: Reviewed social history from my progress note on 01/10/2019 Social History   Socioeconomic History   Marital status: Married    Spouse name: Not on file   Number of children: 1   Years of education: Not on file   Highest education level: Not on file  Occupational History   Not on file  Tobacco Use   Smoking status: Never Smoker   Smokeless tobacco: Never Used  Substance and Sexual Activity   Alcohol use: Yes    Alcohol/week: 0.0 standard drinks    Comment: maybe two drinks/month.   Drug use: No   Sexual activity: Yes    Birth control/protection: Implant  Other Topics Concern   Not on file  Social History Narrative   Lives in [place] with her husband and 85 month old dtr.  Does not routinely exercise.       Social Determinants of Health   Financial Resource Strain:    Difficulty of Paying Living Expenses:   Food Insecurity:    Worried About Charity fundraiser in the Last Year:    Arboriculturist in the Last Year:   Transportation Needs:    Film/video editor (Medical):    Lack of Transportation (Non-Medical):   Physical Activity:    Days of Exercise per Week:    Minutes of Exercise per Session:   Stress:    Feeling of Stress :   Social Connections:    Frequency of Communication with Friends and Family:    Frequency of Social Gatherings with Friends and Family:    Attends Religious Services:    Active Member of Clubs or Organizations:    Attends Archivist Meetings:    Marital Status:     Allergies:  Allergies  Allergen Reactions   Sulfa Antibiotics Other (See Comments) and Hives   Sulfur Hives    Metabolic Disorder Labs: No results found for: HGBA1C, MPG No results found for: PROLACTIN No results found  for: CHOL, TRIG, HDL, CHOLHDL, VLDL, LDLCALC Lab Results  Component Value Date   TSH 0.696 07/12/2015    Therapeutic Level Labs: No results found for: LITHIUM No results found for: VALPROATE No components found for:  CBMZ  Current Medications: Current Outpatient Medications  Medication Sig Dispense Refill   ALPRAZolam (XANAX) 0.25 MG tablet Take 1 tablet (0.25 mg total) by mouth daily as needed for anxiety. Only for severe anxiety attacks 12 tablet 1   cyclobenzaprine (FLEXERIL) 10 MG tablet TAKE 1 TABLET BY MOUTH EVERY 8 HOURS AS NEEDED FOR MUSCLE SPASMS. 30 tablet 1   eletriptan (RELPAX) 40 MG tablet TAKE 1 TABLET BY MOUTH AS NEEDED FOR MIGRAINE OR HEADACHE. MAY REPEAT IN 2 HOURS  IF HEADACHE PERSISTS OR RECURS. 10 tablet 11   escitalopram (LEXAPRO) 20 MG tablet Take 1 tablet (20 mg total) by mouth daily. 90 tablet 0   fluticasone (FLONASE) 50 MCG/ACT nasal spray Place 2 sprays into both nostrils daily. 16 g 0   HYDROcodone-acetaminophen (NORCO) 10-325 MG tablet Take 1 tablet by mouth every 6 (six) hours as needed. 30 tablet 0   ibuprofen (ADVIL) 800 MG tablet      ibuprofen (ADVIL) 800 MG tablet TAKE 1 TABLET BY MOUTH EVERY 8 HOURS AS NEEDED. 60 tablet 1   levonorgestrel-ethinyl estradiol (KURVELO) 0.15-30 MG-MCG tablet Take 1 tablet by mouth daily. Active pills only 3 Package 5   levonorgestrel-ethinyl estradiol (KURVELO) 0.15-30 MG-MCG tablet Take 1 tablet by mouth daily. 1 Package 11   meclizine (ANTIVERT) 12.5 MG tablet Take 1 tablet (12.5 mg total) by mouth 3 (three) times daily as needed for dizziness. (Patient not taking: Reported on 12/20/2019) 30 tablet 0   naproxen sodium (ANAPROX DS) 550 MG tablet Take 1 tablet (550 mg total) by mouth as needed for headache. 60 tablet 1   NURTEC 75 MG TBDP TAKE 1 TABLET BY MOUTH ONCE DAILY AS NEEDED. 8 tablet 2   ondansetron (ZOFRAN-ODT) 4 MG disintegrating tablet DISSOLVE 1 TABLET BY MOUTH EVERY 8 (EIGHT) HOURS AS NEEDED FOR  NAUSEA OR VOMITING. 20 tablet 6   promethazine (PHENERGAN) 25 MG suppository Place 1 suppository (25 mg total) rectally every 6 (six) hours as needed for nausea or vomiting. 12 each 0   promethazine (PHENERGAN) 25 MG tablet Take 1 tablet (25 mg total) by mouth every 8 (eight) hours as needed for nausea or vomiting. 30 tablet 0   propranolol ER (INDERAL LA) 60 MG 24 hr capsule Take by mouth.     SUMAtriptan (IMITREX) 100 MG tablet Take 1 tablet (100 mg total) by mouth as needed. 12 tablet 11   SUMAtriptan 6 MG/0.5ML SOAJ Inject 1 cartridge into the skin as needed (migraine). May repeat in 2 hours.  No more than 2 doses of Imitrex per day. 12 Cartridge 3   valACYclovir (VALTREX) 1000 MG tablet Take 2 tablets (2,000 mg total) by mouth 2 (two) times daily. 4 tablet 1   zolmitriptan (ZOMIG) 5 MG nasal solution Place 1 spray into the nose as needed for migraine. 6 Units 5   No current facility-administered medications for this visit.     Musculoskeletal: Strength & Muscle Tone: UTA Gait & Station: UTA Patient leans: N/A  Psychiatric Specialty Exam: Review of Systems  Constitutional: Positive for fatigue.  Psychiatric/Behavioral: The patient is nervous/anxious.   All other systems reviewed and are negative.   There were no vitals taken for this visit.There is no height or weight on file to calculate BMI.  General Appearance: UTA  Eye Contact:  UTA  Speech:  Clear and Coherent  Volume:  Normal  Mood:  Anxious coping okay  Affect:  UTA  Thought Process:  Goal Directed and Descriptions of Associations: Intact  Orientation:  Full (Time, Place, and Person)  Thought Content: Logical   Suicidal Thoughts:  No  Homicidal Thoughts:  No  Memory:  Immediate;   Fair Recent;   Fair Remote;   Fair  Judgement:  Fair  Insight:  Fair  Psychomotor Activity:  UTA  Concentration:  Concentration: Fair and Attention Span: Fair  Recall:  [employer] of Knowledge: Fair  Language: Fair  Akathisia:   No  Handed:  Right  AIMS (if indicated): UTA  Assets:  Communication Skills Desire for Improvement Housing Social Support  ADL's:  Intact  Cognition: WNL  Sleep:  Fair   Screenings:   Assessment and Plan: Candice [name] is a 34 year old Caucasian female, employed, married, has a history of depression, anxiety was evaluated by telemedicine today.  Patient with psychosocial stressors of relationship struggles, work-related problems and daughter's health issues.  Patient is currently stable on current medication regiment.  Patient with psychosocial stressors of separation from her husband will benefit from psychotherapy session.  Plan as noted below.  Plan MDD in remission Lexapro 20 mg p.o. daily  Anxiety attacks-stable Xanax 0.25 mg as needed 1-2 times a week for severe panic symptoms. Patient has been limiting use. Reviewed [place] controlled substance database.   Insomnia unspecified-stable Continue sleep hygiene techniques.  Discussed with patient to establish care with [facility] counselor.  Once she is established in therapy and if her symptoms continues to remain stable then would consider tapering her off of the Lexapro since she is interested in the same.  This was discussed with patient and she agrees with plan.  Follow-up in clinic in 6 to 8 weeks or sooner if needed.  I have spent atleast 20 minutes non face to face with patient today. More than 50 % of the time was spent for preparing to see the patient ( e.g., review of test, records ) , ordering medications and test ,psychoeducation and supportive psychotherapy and care coordination,as well as documenting clinical information in electronic health record. This note was generated in part or whole with voice recognition software. Voice recognition is usually quite accurate but there are transcription errors that can and very often do occur. I apologize for any typographical errors that were not detected and  corrected.        Jomarie Longs, MD 02/08/2020, 8:45 PM

## 2020-03-13 ENCOUNTER — Other Ambulatory Visit: Payer: Self-pay

## 2020-03-22 ENCOUNTER — Other Ambulatory Visit: Payer: Self-pay | Admitting: Internal Medicine

## 2020-03-26 ENCOUNTER — Other Ambulatory Visit: Payer: Self-pay | Admitting: Physician Assistant

## 2020-04-11 ENCOUNTER — Encounter: Payer: Self-pay | Admitting: Psychiatry

## 2020-04-11 ENCOUNTER — Telehealth (INDEPENDENT_AMBULATORY_CARE_PROVIDER_SITE_OTHER): Payer: 59 | Admitting: Psychiatry

## 2020-04-11 ENCOUNTER — Other Ambulatory Visit: Payer: Self-pay

## 2020-04-11 DIAGNOSIS — F5105 Insomnia due to other mental disorder: Secondary | ICD-10-CM | POA: Diagnosis not present

## 2020-04-11 DIAGNOSIS — F3342 Major depressive disorder, recurrent, in full remission: Secondary | ICD-10-CM

## 2020-04-11 DIAGNOSIS — F410 Panic disorder [episodic paroxysmal anxiety] without agoraphobia: Secondary | ICD-10-CM | POA: Diagnosis not present

## 2020-04-11 MED ORDER — ESCITALOPRAM OXALATE 20 MG PO TABS: 20.0000 mg | ORAL_TABLET | Freq: Every day | ORAL | 0 refills | Status: DC

## 2020-04-11 NOTE — Progress Notes (Signed)
Provider Location : [facility] Patient Location : [facility]  Virtual Visit via Video Note  I connected with Candice [name] on 04/11/20 at  4:00 PM EDT by a video enabled telemedicine application and verified that I am speaking with the correct person using two identifiers.   I discussed the limitations of evaluation and management by telemedicine and the availability of in person appointments. The patient expressed understanding and agreed to proceed.    I discussed the assessment and treatment plan with the patient. The patient was provided an opportunity to ask questions and all were answered. The patient agreed with the plan and demonstrated an understanding of the instructions.   The patient was advised to call back or seek an in-person evaluation if the symptoms worsen or if the condition fails to improve as anticipated.   BH MD OP Progress Note  04/11/2020 5:14 PM Candice [name]  MRN:  [id number]  Chief Complaint:  Chief Complaint    Follow-up     HPI: Candice [name] is a 34 year old Caucasian female, employed, lives in [place], has a history of depression, panic attacks, bilateral hearing loss, migraine headache was evaluated by telemedicine today.  A video call was initiated however due to connection problem it had to be changed to a phone call.  Patient today reports she is currently doing well with regards to her mood.  She is tolerating the Lexapro well.  She denies side effects.  She reports she has not had any significant panic attacks and may have used Xanax only every 2 weeks or so for anxiety symptoms.  She is trying to limit use.  She reports work continues to be stressful.  She is currently interviewing for a new position.  She reports she does not care what the outcome is.  She reports there has been a high turnover at her work with 3 people leaving.  This does make it more challenging for her.  She has started psychotherapy sessions with [facility].  She reports she  plans to follow-up with this therapist.  She continues to have relationship struggles with her husband.  She is planning to follow-up with therapist for the same.  Patient denies any suicidality, homicidality or perceptual disturbances. Visit Diagnosis:    ICD-10-CM   1. MDD (major depressive disorder), recurrent, in full remission (HCC)  F33.42 escitalopram (LEXAPRO) 20 MG tablet  2. Panic attacks  F41.0   3. Insomnia due to mental condition  F51.05     Past Psychiatric History: I have reviewed past psychiatric history from my progress note on 01/10/2019  Past Medical History:  Past Medical History:  Diagnosis Date  . Anxiety   . Depression   . Hearing loss   . Migraines    a. x 10+ years.  . Postpartum care following cesarean delivery (5/29) 02/04/2014  . Pre-syncope    a. 07/2015 [facility] ED visit, ? vasovagal, improved with IVF.  [name] Short cervix in second trimester, antepartum 11/16/2013    Past Surgical History:  Procedure Laterality Date  . CESAREAN SECTION N/A 02/03/2014   Procedure: Primary Cesarean Section Delivery Baby Girl @ 2244, Apgars 9/9;  Surgeon: Serita Kyle, MD;  Location: [facility];  Service: Obstetrics;  Laterality: N/A;  . CESAREAN SECTION    . WISDOM TOOTH EXTRACTION  2002    Family Psychiatric History: I have reviewed family psychiatric history from my progress note on 01/10/2019  Family History:  Family History  Problem Relation Age of Onset  .  Depression Mother   . Anxiety disorder Mother   . Heart attack Mother        NSTEMI @ age 74.  [name] Alcohol abuse Paternal Grandfather   . Sleep apnea Father        alive @ 49.  [name] Heart attack Maternal Grandfather   . Anxiety disorder Maternal Grandmother   . Depression Maternal Grandmother   . Diabetes Maternal Grandmother   . Hypertension Maternal Grandmother     Social History: I have reviewed social history from my progress note on 01/10/2019 Social History   Socioeconomic History  . Marital status:  Married    Spouse name: Not on file  . Number of children: 1  . Years of education: Not on file  . Highest education level: Not on file  Occupational History  . Not on file  Tobacco Use  . Smoking status: Never Smoker  . Smokeless tobacco: Never Used  Vaping Use  . Vaping Use: Never used  Substance and Sexual Activity  . Alcohol use: Yes    Alcohol/week: 0.0 standard drinks    Comment: maybe two drinks/month.  . Drug use: No  . Sexual activity: Yes    Birth control/protection: Implant  Other Topics Concern  . Not on file  Social History Narrative   Lives in [place] with her husband and 42 month old dtr.  Does not routinely exercise.       Social Determinants of Health   Financial Resource Strain:   . Difficulty of Paying Living Expenses:   Food Insecurity:   . Worried About Programme researcher, broadcasting/film/video in the Last Year:   . Barista in the Last Year:   Transportation Needs:   . Freight forwarder (Medical):   [name] Lack of Transportation (Non-Medical):   Physical Activity:   . Days of Exercise per Week:   . Minutes of Exercise per Session:   Stress:   . Feeling of Stress :   Social Connections:   . Frequency of Communication with Friends and Family:   . Frequency of Social Gatherings with Friends and Family:   . Attends Religious Services:   . Active Member of Clubs or Organizations:   . Attends Banker Meetings:   [name] Marital Status:     Allergies:  Allergies  Allergen Reactions  . Sulfa Antibiotics Other (See Comments) and Hives  . Sulfur Hives    Metabolic Disorder Labs: No results found for: HGBA1C, MPG No results found for: PROLACTIN No results found for: CHOL, TRIG, HDL, CHOLHDL, VLDL, LDLCALC Lab Results  Component Value Date   TSH 0.696 07/12/2015    Therapeutic Level Labs: No results found for: LITHIUM No results found for: VALPROATE No components found for:  CBMZ  Current Medications: Current Outpatient Medications   Medication Sig Dispense Refill  . ALPRAZolam (XANAX) 0.25 MG tablet Take 1 tablet (0.25 mg total) by mouth daily as needed for anxiety. Only for severe anxiety attacks 12 tablet 1  . cyclobenzaprine (FLEXERIL) 10 MG tablet TAKE 1 TABLET BY MOUTH EVERY 8 HOURS AS NEEDED FOR MUSCLE SPASMS. 30 tablet 1  . eletriptan (RELPAX) 40 MG tablet TAKE 1 TABLET BY MOUTH AS NEEDED FOR MIGRAINE OR HEADACHE. MAY REPEAT IN 2 HOURS IF HEADACHE PERSISTS OR RECURS. 10 tablet 11  . escitalopram (LEXAPRO) 20 MG tablet Take 1 tablet (20 mg total) by mouth daily. 90 tablet 0  . fluticasone (FLONASE) 50 MCG/ACT nasal spray Place 2 sprays into  both nostrils daily. 16 g 0  . HYDROcodone-acetaminophen (NORCO) 10-325 MG tablet Take 1 tablet by mouth every 6 (six) hours as needed. 30 tablet 0  . ibuprofen (ADVIL) 800 MG tablet     . ibuprofen (ADVIL) 800 MG tablet TAKE 1 TABLET BY MOUTH EVERY 8 HOURS AS NEEDED. 60 tablet 1  . levonorgestrel-ethinyl estradiol (KURVELO) 0.15-30 MG-MCG tablet Take 1 tablet by mouth daily. Active pills only 3 Package 5  . levonorgestrel-ethinyl estradiol (KURVELO) 0.15-30 MG-MCG tablet Take 1 tablet by mouth daily. 1 Package 11  . meclizine (ANTIVERT) 12.5 MG tablet Take 1 tablet (12.5 mg total) by mouth 3 (three) times daily as needed for dizziness. (Patient not taking: Reported on 12/20/2019) 30 tablet 0  . naproxen sodium (ANAPROX DS) 550 MG tablet Take 1 tablet (550 mg total) by mouth as needed for headache. 60 tablet 1  . NURTEC 75 MG TBDP TAKE 1 TABLET BY MOUTH ONCE DAILY AS NEEDED. 8 tablet 2  . ondansetron (ZOFRAN-ODT) 4 MG disintegrating tablet DISSOLVE 1 TABLET BY MOUTH EVERY 8 (EIGHT) HOURS AS NEEDED FOR NAUSEA OR VOMITING. 20 tablet 6  . promethazine (PHENERGAN) 25 MG suppository Place 1 suppository (25 mg total) rectally every 6 (six) hours as needed for nausea or vomiting. 12 each 0  . promethazine (PHENERGAN) 25 MG tablet Take 1 tablet (25 mg total) by mouth every 8 (eight) hours as  needed for nausea or vomiting. 30 tablet 0  . propranolol ER (INDERAL LA) 60 MG 24 hr capsule Take by mouth.    . SUMAtriptan (IMITREX) 100 MG tablet Take 1 tablet (100 mg total) by mouth as needed. 12 tablet 11  . SUMAtriptan 6 MG/0.5ML SOAJ Inject 1 cartridge into the skin as needed (migraine). May repeat in 2 hours.  No more than 2 doses of Imitrex per day. 12 Cartridge 3  . valACYclovir (VALTREX) 1000 MG tablet Take 2 tablets (2,000 mg total) by mouth 2 (two) times daily. 4 tablet 1  . zolmitriptan (ZOMIG) 5 MG nasal solution Place 1 spray into the nose as needed for migraine. 6 Units 5   No current facility-administered medications for this visit.     Musculoskeletal: Strength & Muscle Tone: UTA Gait & Station: UTA Patient leans: N/A  Psychiatric Specialty Exam: Review of Systems  Psychiatric/Behavioral: The patient is nervous/anxious.   All other systems reviewed and are negative.   There were no vitals taken for this visit.There is no height or weight on file to calculate BMI.  General Appearance: UTA  Eye Contact:  UTA  Speech:  Clear and Coherent  Volume:  Normal  Mood:  Anxious coping well  Affect:  UTA  Thought Process:  Goal Directed and Descriptions of Associations: Intact  Orientation:  Full (Time, Place, and Person)  Thought Content: Logical   Suicidal Thoughts:  No  Homicidal Thoughts:  No  Memory:  Immediate;   Fair Recent;   Fair Remote;   Fair  Judgement:  Fair  Insight:  Fair  Psychomotor Activity:  UTA  Concentration:  Concentration: Fair and Attention Span: Fair  Recall:  [employer] of Knowledge: Fair  Language: Fair  Akathisia:  No  Handed:  Right  AIMS (if indicated): UTA  Assets:  Communication Skills Desire for Improvement Housing Social Support  ADL's:  Intact  Cognition: WNL  Sleep:  Fair   Screenings:   Assessment and Plan: Candice [name] is a 34 year old Caucasian female, employed, married, has a history of depression,  anxiety  was evaluated by telemedicine today.  Patient with psychosocial stressors of relationship struggles, work-related problems and daughter's health issues.  She is currently compliant on medications and has started [facility] counseling.  Plan as noted below.  Plan MDD in remission Lexapro 20 mg p.o. daily  Anxiety attacks-stable Xanax 0.25 mg p.o. daily as needed-1-2 times a week for severe panic symptoms She has been limiting use Reviewed [place] controlled substance database  Insomnia likely due to mental health condition-stable Continue sleep hygiene techniques  Patient has established care with [facility] counselor, encouraged her to continue the same.  Follow-up in clinic in 2 to 3 months or sooner if needed.  I have spent atleast 20 minutes non face to face with patient today. More than 50 % of the time was spent for preparing to see the patient ( e.g., review of test, records ), ordering medications and test ,psychoeducation and supportive psychotherapy and care coordination,as well as documenting clinical information in electronic health record. This note was generated in part or whole with voice recognition software. Voice recognition is usually quite accurate but there are transcription errors that can and very often do occur. I apologize for any typographical errors that were not detected and corrected.     Jomarie Longs.   Candice Bellantoni, MD 04/11/2020, 5:14 PM

## 2020-06-11 ENCOUNTER — Other Ambulatory Visit: Payer: Self-pay | Admitting: Internal Medicine

## 2020-06-13 ENCOUNTER — Encounter: Payer: Self-pay | Admitting: Psychiatry

## 2020-06-13 ENCOUNTER — Other Ambulatory Visit: Payer: Self-pay | Admitting: Internal Medicine

## 2020-06-13 ENCOUNTER — Other Ambulatory Visit (HOSPITAL_COMMUNITY): Payer: Self-pay | Admitting: Internal Medicine

## 2020-06-13 ENCOUNTER — Telehealth (INDEPENDENT_AMBULATORY_CARE_PROVIDER_SITE_OTHER): Payer: 59 | Admitting: Psychiatry

## 2020-06-13 ENCOUNTER — Ambulatory Visit
Admission: RE | Admit: 2020-06-13 | Discharge: 2020-06-13 | Disposition: A | Discharge status: Home / Self Care | Payer: 59 | Source: Ambulatory Visit | Attending: Internal Medicine | Admitting: Internal Medicine

## 2020-06-13 ENCOUNTER — Other Ambulatory Visit: Payer: Self-pay

## 2020-06-13 DIAGNOSIS — F5105 Insomnia due to other mental disorder: Secondary | ICD-10-CM | POA: Diagnosis not present

## 2020-06-13 DIAGNOSIS — R112 Nausea with vomiting, unspecified: Secondary | ICD-10-CM | POA: Diagnosis not present

## 2020-06-13 DIAGNOSIS — R42 Dizziness and giddiness: Secondary | ICD-10-CM | POA: Insufficient documentation

## 2020-06-13 DIAGNOSIS — F3342 Major depressive disorder, recurrent, in full remission: Secondary | ICD-10-CM

## 2020-06-13 DIAGNOSIS — F410 Panic disorder [episodic paroxysmal anxiety] without agoraphobia: Secondary | ICD-10-CM | POA: Diagnosis not present

## 2020-06-13 DIAGNOSIS — R270 Ataxia, unspecified: Secondary | ICD-10-CM | POA: Diagnosis not present

## 2020-06-13 MED ORDER — ESCITALOPRAM OXALATE 20 MG PO TABS: 20.0000 mg | ORAL_TABLET | Freq: Every day | ORAL | 0 refills | Status: DC

## 2020-06-13 MED ORDER — GADOBUTROL 1 MMOL/ML IV SOLN
7.5000 mL | Freq: Once | INTRAVENOUS | Status: AC | PRN
  Administered 2020-06-13: 7.5 mL via INTRAVENOUS

## 2020-06-13 NOTE — Progress Notes (Signed)
Provider Location : [facility] Patient Location : Home  Participants: Patient , Provider  Virtual Visit via Telephone Note  I connected with Candice [name] on 06/13/20 at  4:00 PM EDT by telephone and verified that I am speaking with the correct person using two identifiers.   I discussed the limitations, risks, security and privacy concerns of performing an evaluation and management service by telephone and the availability of in person appointments. I also discussed with the patient that there may be a patient responsible charge related to this service. The patient expressed understanding and agreed to proceed.     I discussed the assessment and treatment plan with the patient. The patient was provided an opportunity to ask questions and all were answered. The patient agreed with the plan and demonstrated an understanding of the instructions.   The patient was advised to call back or seek an in-person evaluation if the symptoms worsen or if the condition fails to improve as anticipated.   BH MD OP Progress Note  06/13/2020 5:06 PM Candice [name]  MRN:  [id number]  Chief Complaint:  Chief Complaint    Follow-up     HPI: Candice [name] is a 34 year old Caucasian female, employed, lives in [place], has a history of MDD, panic attacks, insomnia, bilateral hearing loss, migraine headache was evaluated by phone today.  Patient today reports she is currently struggling with dizziness, lightheadedness since the past 5 days.  She reports whenever she turns her head she feels dizzy.  She was prescribed meclizine as well as Phenergan recently.  She also had MRI done.  She is awaiting the results.  She reports this does make her anxious.  She continues to take her medications as prescribed.  She continues to have relationship struggles with her husband.  Her husband has started working which is a relief.  Patient denies any suicidality, homicidality or perceptual disturbances.  She  denies any other concerns today.  Visit Diagnosis:    ICD-10-CM   1. MDD (major depressive disorder), recurrent, in full remission (HCC)  F33.42 escitalopram (LEXAPRO) 20 MG tablet  2. Panic attacks  F41.0   3. Insomnia due to mental condition  F51.05     Past Psychiatric History: I have reviewed past psychiatric history from my progress note on 01/10/2019  Past Medical History:  Past Medical History:  Diagnosis Date  . Anxiety   . Depression   . Hearing loss   . Migraines    a. x 10+ years.  . Postpartum care following cesarean delivery (5/29) 02/04/2014  . Pre-syncope    a. 07/2015 [facility] ED visit, ? vasovagal, improved with IVF.  [name] Short cervix in second trimester, antepartum 11/16/2013    Past Surgical History:  Procedure Laterality Date  . CESAREAN SECTION N/A 02/03/2014   Procedure: Primary Cesarean Section Delivery Baby Girl @ 2244, Apgars 9/9;  Surgeon: Serita Kyle, MD;  Location: [facility];  Service: Obstetrics;  Laterality: N/A;  . CESAREAN SECTION    . WISDOM TOOTH EXTRACTION  2002    Family Psychiatric History: I have reviewed family psychiatric history from my progress note on 01/10/2019 Family History:  Family History  Problem Relation Age of Onset  . Depression Mother   . Anxiety disorder Mother   . Heart attack Mother        NSTEMI @ age 45.  [name] Alcohol abuse Paternal Grandfather   . Sleep apnea Father        alive @ 24.  [name]  Heart attack Maternal Grandfather   . Anxiety disorder Maternal Grandmother   . Depression Maternal Grandmother   . Diabetes Maternal Grandmother   . Hypertension Maternal Grandmother     Social History: I have reviewed social history from my progress note on 01/10/2019 Social History   Socioeconomic History  . Marital status: Married    Spouse name: Not on file  . Number of children: 1  . Years of education: Not on file  . Highest education level: Not on file  Occupational History  . Not on file  Tobacco Use  . Smoking  status: Never Smoker  . Smokeless tobacco: Never Used  Vaping Use  . Vaping Use: Never used  Substance and Sexual Activity  . Alcohol use: Yes    Alcohol/week: 0.0 standard drinks    Comment: maybe two drinks/month.  . Drug use: No  . Sexual activity: Yes    Birth control/protection: Implant  Other Topics Concern  . Not on file  Social History Narrative   Lives in [place] with her husband and 58 month old dtr.  Does not routinely exercise.       Social Determinants of Health   Financial Resource Strain:   . Difficulty of Paying Living Expenses: Not on file  Food Insecurity:   . Worried About Programme researcher, broadcasting/film/video in the Last Year: Not on file  . Ran Out of Food in the Last Year: Not on file  Transportation Needs:   . Lack of Transportation (Medical): Not on file  . Lack of Transportation (Non-Medical): Not on file  Physical Activity:   . Days of Exercise per Week: Not on file  . Minutes of Exercise per Session: Not on file  Stress:   . Feeling of Stress : Not on file  Social Connections:   . Frequency of Communication with Friends and Family: Not on file  . Frequency of Social Gatherings with Friends and Family: Not on file  . Attends Religious Services: Not on file  . Active Member of Clubs or Organizations: Not on file  . Attends Banker Meetings: Not on file  . Marital Status: Not on file    Allergies:  Allergies  Allergen Reactions  . Sulfa Antibiotics Other (See Comments) and Hives  . Sulfur Hives    Metabolic Disorder Labs: No results found for: HGBA1C, MPG No results found for: PROLACTIN No results found for: CHOL, TRIG, HDL, CHOLHDL, VLDL, LDLCALC Lab Results  Component Value Date   TSH 0.696 07/12/2015    Therapeutic Level Labs: No results found for: LITHIUM No results found for: VALPROATE No components found for:  CBMZ  Current Medications: Current Outpatient Medications  Medication Sig Dispense Refill  . cyclobenzaprine  (FLEXERIL) 10 MG tablet TAKE 1 TABLET BY MOUTH EVERY 8 HOURS AS NEEDED FOR MUSCLE SPASMS. 30 tablet 1  . eletriptan (RELPAX) 40 MG tablet TAKE 1 TABLET BY MOUTH AS NEEDED FOR MIGRAINE OR HEADACHE. MAY REPEAT IN 2 HOURS IF HEADACHE PERSISTS OR RECURS. 10 tablet 11  . escitalopram (LEXAPRO) 20 MG tablet Take 1 tablet (20 mg total) by mouth daily. 90 tablet 0  . fluticasone (FLONASE) 50 MCG/ACT nasal spray Place 2 sprays into both nostrils daily. 16 g 0  . ibuprofen (ADVIL) 800 MG tablet     . levonorgestrel-ethinyl estradiol (KURVELO) 0.15-30 MG-MCG tablet Take 1 tablet by mouth daily. Active pills only 3 Package 5  . meclizine (ANTIVERT) 25 MG tablet Take 25 mg by mouth 2 (  two) times daily as needed.    . naproxen sodium (ANAPROX DS) 550 MG tablet Take 1 tablet (550 mg total) by mouth as needed for headache. 60 tablet 1  . NURTEC 75 MG TBDP TAKE 1 TABLET BY MOUTH ONCE DAILY AS NEEDED. 8 tablet 2  . ondansetron (ZOFRAN-ODT) 4 MG disintegrating tablet DISSOLVE 1 TABLET BY MOUTH EVERY 8 (EIGHT) HOURS AS NEEDED FOR NAUSEA OR VOMITING. 20 tablet 6  . propranolol ER (INDERAL LA) 60 MG 24 hr capsule Take by mouth.    . SUMAtriptan (IMITREX) 100 MG tablet Take 1 tablet (100 mg total) by mouth as needed. 12 tablet 11  . SUMAtriptan 6 MG/0.5ML SOAJ Inject 1 cartridge into the skin as needed (migraine). May repeat in 2 hours.  No more than 2 doses of Imitrex per day. 12 Cartridge 3  . valACYclovir (VALTREX) 1000 MG tablet Take 2 tablets (2,000 mg total) by mouth 2 (two) times daily. 4 tablet 1  . zolmitriptan (ZOMIG) 5 MG nasal solution Place 1 spray into the nose as needed for migraine. 6 Units 5  . ALPRAZolam (XANAX) 0.25 MG tablet Take 1 tablet (0.25 mg total) by mouth daily as needed for anxiety. Only for severe anxiety attacks (Patient not taking: Reported on 06/13/2020) 12 tablet 1  . HYDROcodone-acetaminophen (NORCO) 10-325 MG tablet Take 1 tablet by mouth every 6 (six) hours as needed. (Patient not  taking: Reported on 06/13/2020) 30 tablet 0  . promethazine (PHENERGAN) 25 MG suppository Place 1 suppository (25 mg total) rectally every 6 (six) hours as needed for nausea or vomiting. (Patient not taking: Reported on 06/13/2020) 12 each 0  . promethazine (PHENERGAN) 25 MG tablet Take 1 tablet (25 mg total) by mouth every 8 (eight) hours as needed for nausea or vomiting. (Patient not taking: Reported on 06/13/2020) 30 tablet 0   No current facility-administered medications for this visit.     Musculoskeletal: Strength & Muscle Tone: UTA Gait & Station: UTA Patient leans: N/A  Psychiatric Specialty Exam: Review of Systems  Neurological: Positive for dizziness and light-headedness.  Psychiatric/Behavioral: The patient is nervous/anxious.   All other systems reviewed and are negative.   There were no vitals taken for this visit.There is no height or weight on file to calculate BMI.  General Appearance: UTA  Eye Contact:  UTA  Speech:  Normal Rate  Volume:  Normal  Mood:  Anxious  Affect:  UTA  Thought Process:  Goal Directed and Descriptions of Associations: Intact  Orientation:  Full (Time, Place, and Person)  Thought Content: Logical   Suicidal Thoughts:  No  Homicidal Thoughts:  No  Memory:  Immediate;   Fair Recent;   Fair Remote;   Fair  Judgement:  Fair  Insight:  Fair  Psychomotor Activity:  UTA  Concentration:  Concentration: Fair and Attention Span: Fair  Recall:  [employer] of Knowledge: Fair  Language: Fair  Akathisia:  No  Handed:  Right  AIMS (if indicated): UTA  Assets:  Communication Skills Desire for Improvement Housing Social Support  ADL's:  Intact  Cognition: WNL  Sleep:  Fair   Screenings:   Assessment and Plan: Candice [name] is a 34 year old Caucasian female, employed, married, has a history of depression, anxiety was evaluated by phone today.  Patient is currently struggling with vertigo/dizziness and is currently under the care of her  providers.  Her physical problems does make her anxious.  Discussed plan as noted below.  Plan MDD in remission  Lexapro 20 mg p.o. daily  Anxiety attacks-stable She does have situational anxiety about her current health issues. We will continue Xanax 0.25 mg p.o. daily as needed for severe panic symptoms.  She rarely uses it.  Insomnia-stable Continue sleep hygiene  Will not make any medication changes today since patient is currently struggling with dizziness.  Follow-up in clinic in 6 to 8 weeks or sooner if needed.  I have spent atleast 10 minutes non face to face with patient today. More than 50 % of the time was spent for preparing to see the patient ( e.g., review of test, records ), ordering medications and test ,psychoeducation and supportive psychotherapy and care coordination,as well as documenting clinical information in electronic health record. This note was generated in part or whole with voice recognition software. Voice recognition is usually quite accurate but there are transcription errors that can and very often do occur. I apologize for any typographical errors that were not detected and corrected.      Jomarie LongsSaramma Clennon Nasca, MD 06/13/2020, 5:06 PM

## 2020-08-14 ENCOUNTER — Other Ambulatory Visit: Payer: Self-pay

## 2020-08-14 ENCOUNTER — Other Ambulatory Visit: Payer: Self-pay | Admitting: Psychiatry

## 2020-08-14 ENCOUNTER — Encounter: Payer: Self-pay | Admitting: Psychiatry

## 2020-08-14 ENCOUNTER — Telehealth (INDEPENDENT_AMBULATORY_CARE_PROVIDER_SITE_OTHER): Payer: 59 | Admitting: Psychiatry

## 2020-08-14 DIAGNOSIS — F330 Major depressive disorder, recurrent, mild: Secondary | ICD-10-CM

## 2020-08-14 DIAGNOSIS — F5105 Insomnia due to other mental disorder: Secondary | ICD-10-CM | POA: Diagnosis not present

## 2020-08-14 DIAGNOSIS — F410 Panic disorder [episodic paroxysmal anxiety] without agoraphobia: Secondary | ICD-10-CM | POA: Diagnosis not present

## 2020-08-14 MED ORDER — ALPRAZOLAM 0.25 MG PO TABS: 0.2500 mg | ORAL_TABLET | Freq: Every day | ORAL | 1 refills | Status: DC | PRN

## 2020-08-14 MED ORDER — BUPROPION HCL ER (SR) 150 MG PO TB12: 150.0000 mg | ORAL_TABLET | Freq: Two times a day (BID) | ORAL | 1 refills | Status: DC

## 2020-08-14 NOTE — Patient Instructions (Signed)
Bupropion sustained-release tablets (Depression/Mood Disorders) What is this medicine? BUPROPION (byoo PROE pee on) is used to treat depression. This medicine may be used for other purposes; ask your health care provider or pharmacist if you have questions. COMMON BRAND NAME(S): Budeprion SR, Wellbutrin SR What should I tell my health care provider before I take this medicine? They need to know if you have any of these conditions:  an eating disorder, such as anorexia or bulimia  bipolar disorder or psychosis  diabetes or high blood sugar, treated with medication  glaucoma  head injury or brain tumor  heart disease, previous heart attack, or irregular heart beat  high blood pressure  kidney or liver disease  seizures  suicidal thoughts or a previous suicide attempt  Tourette's syndrome  weight loss  an unusual or allergic reaction to bupropion, other medicines, foods, dyes, or preservatives  breast-feeding  pregnant or trying to become pregnant How should I use this medicine? Take this medicine by mouth with a glass of water. Follow the directions on the prescription label. You can take it with or without food. If it upsets your stomach, take it with food. Do not cut, crush or chew this medicine. Take your medicine at regular intervals. If you take this medicine more than once a day, take your second dose at least 8 hours after you take your first dose. To limit difficulty in sleeping, avoid taking this medicine at bedtime. Do not take your medicine more often than directed. Do not stop taking this medicine suddenly except upon the advice of your doctor. Stopping this medicine too quickly may cause serious side effects or your condition may worsen. A special MedGuide will be given to you by the pharmacist with each prescription and refill. Be sure to read this information carefully each time. Talk to your pediatrician regarding the use of this medicine in children. Special  care may be needed. Overdosage: If you think you have taken too much of this medicine contact a poison control center or emergency room at once. NOTE: This medicine is only for you. Do not share this medicine with others. What if I miss a dose? If you miss a dose, skip the missed dose and take your next tablet at the regular time. There should be at least 8 hours between doses. Do not take double or extra doses. What may interact with this medicine? Do not take this medicine with any of the following medications:  linezolid  MAOIs like Azilect, Carbex, Eldepryl, Marplan, Nardil, and Parnate  methylene blue (injected into a vein)  other medicines that contain bupropion like Zyban This medicine may also interact with the following medications:  alcohol  certain medicines for anxiety or sleep  certain medicines for blood pressure like metoprolol, propranolol  certain medicines for depression or psychotic disturbances  certain medicines for HIV or AIDS like efavirenz, lopinavir, nelfinavir, ritonavir  certain medicines for irregular heart beat like propafenone, flecainide  certain medicines for Parkinson's disease like amantadine, levodopa  certain medicines for seizures like carbamazepine, phenytoin, phenobarbital  cimetidine  clopidogrel  cyclophosphamide  digoxin  furazolidone  isoniazid  nicotine  orphenadrine  procarbazine  steroid medicines like prednisone or cortisone  stimulant medicines for attention disorders, weight loss, or to stay awake  tamoxifen  theophylline  thiotepa  ticlopidine  tramadol  warfarin This list may not describe all possible interactions. Give your health care provider a list of all the medicines, herbs, non-prescription drugs, or dietary supplements you use. Also   tell them if you smoke, drink alcohol, or use illegal drugs. Some items may interact with your medicine. What should I watch for while using this medicine? Tell  your doctor if your symptoms do not get better or if they get worse. Visit your doctor or healthcare provider for regular checks on your progress. Because it may take several weeks to see the full effects of this medicine, it is important to continue your treatment as prescribed by your doctor. This medicine may cause serious skin reactions. They can happen weeks to months after starting the medicine. Contact your healthcare provider right away if you notice fevers or flu-like symptoms with a rash. The rash may be red or purple and then turn into blisters or peeling of the skin. Or, you might notice a red rash with swelling of the face, lips or lymph nodes in your neck or under your arms. Patients and their families should watch out for new or worsening thoughts of suicide or depression. Also watch out for sudden changes in feelings such as feeling anxious, agitated, panicky, irritable, hostile, aggressive, impulsive, severely restless, overly excited and hyperactive, or not being able to sleep. If this happens, especially at the beginning of treatment or after a change in dose, call your healthcare provider. Avoid alcoholic drinks while taking this medicine. Drinking excessive alcoholic beverages, using sleeping or anxiety medicines, or quickly stopping the use of these agents while taking this medicine may increase your risk for a seizure. Do not drive or use heavy machinery until you know how this medicine affects you. This medicine can impair your ability to perform these tasks. Do not take this medicine close to bedtime. It may prevent you from sleeping. Your mouth may get dry. Chewing sugarless gum or sucking hard candy, and drinking plenty of water may help. Contact your doctor if the problem does not go away or is severe. What side effects may I notice from receiving this medicine? Side effects that you should report to your doctor or health care professional as soon as possible:  allergic  reactions like skin rash, itching or hives, swelling of the face, lips, or tongue  breathing problems  changes in vision  confusion  elevated mood, decreased need for sleep, racing thoughts, impulsive behavior  fast or irregular heartbeat  hallucinations, loss of contact with reality  increased blood pressure  rash, fever, and swollen lymph nodes  redness, blistering, peeling or loosening of the skin, including inside the mouth  seizures  suicidal thoughts or other mood changes  unusually weak or tired  vomiting Side effects that usually do not require medical attention (report to your doctor or health care professional if they continue or are bothersome):  constipation  headache  loss of appetite  nausea  tremors  weight loss This list may not describe all possible side effects. Call your doctor for medical advice about side effects. You may report side effects to FDA at 1-800-FDA-1088. Where should I keep my medicine? Keep out of the reach of children. Store at room temperature between 20 and 25 degrees C (68 and 77 degrees F), away from direct sunlight and moisture. Keep tightly closed. Throw away any unused medicine after the expiration date. NOTE: This sheet is a summary. It may not cover all possible information. If you have questions about this medicine, talk to your doctor, pharmacist, or health care provider.  2020 Elsevier/Gold Standard (2018-11-18 13:55:14)  

## 2020-08-14 NOTE — Progress Notes (Signed)
This note is not being shared with the patient for the following reason: To prevent harm (release of this note would result in harm to the life or physical safety of the patient or another).   Virtual Visit via Video Note  I connected with Candice [name] on 08/14/20 at  4:20 PM EST by a video enabled telemedicine application and verified that I am speaking with the correct person using two identifiers. Location Provider Location : [facility] Patient Location : Work  Participants: Patient , Provider    I discussed the limitations of evaluation and management by telemedicine and the availability of in person appointments. The patient expressed understanding and agreed to proceed.   I discussed the assessment and treatment plan with the patient. The patient was provided an opportunity to ask questions and all were answered. The patient agreed with the plan and demonstrated an understanding of the instructions.   The patient was advised to call back or seek an in-person evaluation if the symptoms worsen or if the condition fails to improve as anticipated.     [facility] MD OP Progress Note  08/14/2020 5:41 PM Candice [name]  MRN:  [id number]  Chief Complaint:  Chief Complaint    Follow-up     HPI: Candice [name] is a 34 year old Caucasian female, employed, lives in [place], has a history of MDD, panic attacks, insomnia, bilateral hearing loss, migraine headache was evaluated by telemedicine today.  Patient today reports she has been struggling with depressive symptoms since the past few weeks.  She reports it was her birthday last week.  She reports she had a good day on the day of her birthday.  She however reports the next day she had conflict with her husband and that triggered some suicidal thoughts.  She however reports she was able to rationalize with hersel that she should not do anything to hurt herself.  She reports she was able to come up with several factors that will keep her  from harming herself including her daughter who is 18 years old.  Patient reports she did not have a plan.  She also reports she has a friend-Monica whom she can talk to if she has any suicidal thoughts that are serious.  She denies any history of suicide attempts.  Patient reports sadness, crying spell, lack of motivation, low libido, some concentration problems, the past few days.  She reports she does not have any anhedonia since she tries to do more things to distract herself when she is in a depressed phase often.  That is how she copes with her depressive symptoms.  Patient also continues to have anxiety symptoms.  She reports she is able to cope with her anxiety at work.  She however reports recently she had to take a trip to [place] to attend a concert.  She however reports even though that is something that she wanted to do it was very anxiety provoking for her.  She constantly worried about spending that much money to attend the concert.  She reports the whole idea of going to the trip made her nervous.  Patient reports she and her husband continues to have relationship struggles.  Patient has not been in psychotherapy session however agrees to start therapy.  Patient denies any current suicidality, homicidality or perceptual disturbances.  Patient denies any other concerns today.  Visit Diagnosis:    ICD-10-CM   1. MDD (major depressive disorder), recurrent episode, mild (HCC)  F33.0 buPROPion (WELLBUTRIN SR) 150 MG  12 hr tablet  2. Panic attacks  F41.0 ALPRAZolam (XANAX) 0.25 MG tablet  3. Insomnia due to mental condition  F51.05     Past Psychiatric History: I have reviewed past psychiatric history from my progress note on 01/10/2019  Past Medical History:  Past Medical History:  Diagnosis Date  . Anxiety   . Depression   . Hearing loss   . Migraines    a. x 10+ years.  . Postpartum care following cesarean delivery (5/29) 02/04/2014  . Pre-syncope    a. 07/2015 [facility] ED  visit, ? vasovagal, improved with IVF.  [name] Short cervix in second trimester, antepartum 11/16/2013    Past Surgical History:  Procedure Laterality Date  . CESAREAN SECTION N/A 02/03/2014   Procedure: Primary Cesarean Section Delivery Baby Girl @ 2244, Apgars 9/9;  Surgeon: Serita Kyle, MD;  Location: [facility];  Service: Obstetrics;  Laterality: N/A;  . CESAREAN SECTION    . WISDOM TOOTH EXTRACTION  2002    Family Psychiatric History: I have reviewed family psychiatric history from my progress note on 01/10/2019  Family History:  Family History  Problem Relation Age of Onset  . Depression Mother   . Anxiety disorder Mother   . Heart attack Mother        NSTEMI @ age 106.  [name] Alcohol abuse Paternal Grandfather   . Suicidality Paternal Grandfather   . Sleep apnea Father        alive @ 31.  [name] Heart attack Maternal Grandfather   . Anxiety disorder Maternal Grandmother   . Depression Maternal Grandmother   . Diabetes Maternal Grandmother   . Hypertension Maternal Grandmother     Social History: I have reviewed social history from my progress note on 01/10/2019 Social History   Socioeconomic History  . Marital status: Married    Spouse name: Not on file  . Number of children: 1  . Years of education: Not on file  . Highest education level: Not on file  Occupational History  . Not on file  Tobacco Use  . Smoking status: Never Smoker  . Smokeless tobacco: Never Used  Vaping Use  . Vaping Use: Never used  Substance and Sexual Activity  . Alcohol use: Yes    Alcohol/week: 0.0 standard drinks    Comment: maybe two drinks/month.  . Drug use: No  . Sexual activity: Yes    Birth control/protection: Implant  Other Topics Concern  . Not on file  Social History Narrative   Lives in [place] with her husband and 28 month old dtr.  Does not routinely exercise.       Social Determinants of Health   Financial Resource Strain:   . Difficulty of Paying Living Expenses: Not on  file  Food Insecurity:   . Worried About Programme researcher, broadcasting/film/video in the Last Year: Not on file  . Ran Out of Food in the Last Year: Not on file  Transportation Needs:   . Lack of Transportation (Medical): Not on file  . Lack of Transportation (Non-Medical): Not on file  Physical Activity:   . Days of Exercise per Week: Not on file  . Minutes of Exercise per Session: Not on file  Stress:   . Feeling of Stress : Not on file  Social Connections:   . Frequency of Communication with Friends and Family: Not on file  . Frequency of Social Gatherings with Friends and Family: Not on file  . Attends Religious Services: Not on  file  . Active Member of Clubs or Organizations: Not on file  . Attends Banker Meetings: Not on file  . Marital Status: Not on file    Allergies:  Allergies  Allergen Reactions  . Sulfa Antibiotics Other (See Comments) and Hives  . Sulfur Hives    Metabolic Disorder Labs: No results found for: HGBA1C, MPG No results found for: PROLACTIN No results found for: CHOL, TRIG, HDL, CHOLHDL, VLDL, LDLCALC Lab Results  Component Value Date   TSH 0.696 07/12/2015    Therapeutic Level Labs: No results found for: LITHIUM No results found for: VALPROATE No components found for:  CBMZ  Current Medications: Current Outpatient Medications  Medication Sig Dispense Refill  . ALPRAZolam (XANAX) 0.25 MG tablet Take 1 tablet (0.25 mg total) by mouth daily as needed for anxiety. Only for severe anxiety attacks 12 tablet 1  . buPROPion (WELLBUTRIN SR) 150 MG 12 hr tablet Take 1 tablet (150 mg total) by mouth 2 (two) times daily. Start taking 1 tablet in the AM for 1 week and take 1 tablet twice daily after that 60 tablet 1  . cyclobenzaprine (FLEXERIL) 10 MG tablet TAKE 1 TABLET BY MOUTH EVERY 8 HOURS AS NEEDED FOR MUSCLE SPASMS. 30 tablet 1  . eletriptan (RELPAX) 40 MG tablet TAKE 1 TABLET BY MOUTH AS NEEDED FOR MIGRAINE OR HEADACHE. MAY REPEAT IN 2 HOURS IF HEADACHE  PERSISTS OR RECURS. 10 tablet 11  . escitalopram (LEXAPRO) 20 MG tablet Take 1 tablet (20 mg total) by mouth daily. 90 tablet 0  . fluticasone (FLONASE) 50 MCG/ACT nasal spray Place 2 sprays into both nostrils daily. 16 g 0  . HYDROcodone-acetaminophen (NORCO) 10-325 MG tablet Take 1 tablet by mouth every 6 (six) hours as needed. (Patient not taking: Reported on 06/13/2020) 30 tablet 0  . ibuprofen (ADVIL) 800 MG tablet     . levonorgestrel-ethinyl estradiol (KURVELO) 0.15-30 MG-MCG tablet Take 1 tablet by mouth daily. Active pills only 3 Package 5  . meclizine (ANTIVERT) 25 MG tablet Take 25 mg by mouth 2 (two) times daily as needed.    . naproxen sodium (ANAPROX DS) 550 MG tablet Take 1 tablet (550 mg total) by mouth as needed for headache. 60 tablet 1  . NURTEC 75 MG TBDP TAKE 1 TABLET BY MOUTH ONCE DAILY AS NEEDED. 8 tablet 2  . ondansetron (ZOFRAN) 8 MG tablet Take 8 mg by mouth every 8 (eight) hours as needed.    . ondansetron (ZOFRAN-ODT) 4 MG disintegrating tablet DISSOLVE 1 TABLET BY MOUTH EVERY 8 (EIGHT) HOURS AS NEEDED FOR NAUSEA OR VOMITING. 20 tablet 6  . promethazine (PHENERGAN) 25 MG suppository Place 1 suppository (25 mg total) rectally every 6 (six) hours as needed for nausea or vomiting. (Patient not taking: Reported on 06/13/2020) 12 each 0  . promethazine (PHENERGAN) 25 MG tablet Take 1 tablet (25 mg total) by mouth every 8 (eight) hours as needed for nausea or vomiting. (Patient not taking: Reported on 06/13/2020) 30 tablet 0  . propranolol ER (INDERAL LA) 60 MG 24 hr capsule Take by mouth.    . SUMAtriptan (IMITREX) 100 MG tablet Take 1 tablet (100 mg total) by mouth as needed. 12 tablet 11  . SUMAtriptan 6 MG/0.5ML SOAJ Inject 1 cartridge into the skin as needed (migraine). May repeat in 2 hours.  No more than 2 doses of Imitrex per day. 12 Cartridge 3  . valACYclovir (VALTREX) 1000 MG tablet Take 2 tablets (2,000 mg total) by  mouth 2 (two) times daily. 4 tablet 1  . zolmitriptan  (ZOMIG) 5 MG nasal solution Place 1 spray into the nose as needed for migraine. 6 Units 5   No current facility-administered medications for this visit.     Musculoskeletal: Strength & Muscle Tone: UTA Gait & Station: normal Patient leans: N/A  Psychiatric Specialty Exam: Review of Systems  Psychiatric/Behavioral: Positive for decreased concentration and dysphoric mood. The patient is nervous/anxious.   All other systems reviewed and are negative.   There were no vitals taken for this visit.There is no height or weight on file to calculate BMI.  General Appearance: Casual  Eye Contact:  Fair  Speech:  Clear and Coherent  Volume:  Normal  Mood:  Anxious and Dysphoric  Affect:  Congruent  Thought Process:  Goal Directed and Descriptions of Associations: Intact  Orientation:  Full (Time, Place, and Person)  Thought Content: Logical   Suicidal Thoughts:  No  Homicidal Thoughts:  No  Memory:  Immediate;   Fair Recent;   Fair Remote;   Fair  Judgement:  Fair  Insight:  Fair  Psychomotor Activity:  Normal  Concentration:  Concentration: Fair and Attention Span: Fair  Recall:  [employer] of Knowledge: Fair  Language: Fair  Akathisia:  No  Handed:  Right  AIMS (if indicated): UTA  Assets:  Communication Skills Desire for Improvement Housing Social Support  ADL's:  Intact  Cognition: WNL  Sleep:  Fair   Screenings: PHQ2-9     [facility] Visit [facility] 08/14/2020 in [facility]  PHQ-2 Total Score 2  PHQ-9 Total Score 8       Assessment and Plan: KYNLEY METZGER is a 34 year old Caucasian female, employed, married, has a history of depression, anxiety was evaluated by telemedicine today.  Patient is currently struggling with depressive symptoms, and recent suicidality although she denies it now.  Discussed plan as noted below. Risk factors for suicide-recent suicidal thoughts, history of chronic mental health problems, family history of suicide  attempt in her grandfather, relationship struggles, White race, has a firearm. Protective factors are she has good support system from her family, has a friend that she can talk to if she has any suicidal thoughts, is willing to get help, currently compliant on medications, has a daughter whom she is responsible for, is employed, denies substance abuse problems.  Acute risk for suicide is low. Patient currently denies any active plan, and agrees to lock up her firearm.  MDD-unstable Lexapro 20 mg p.o. daily Restart Wellbutrin, start Wellbutrin SR 150 mg p.o. daily for the next 1 week and increase to 150 mg twice a day after that. Refer for CBT.   Anxiety attacks-unstable Continue Lexapro as prescribed Referral for CBT. Continue Xanax 0.25 mg daily as needed for severe panic symptoms.  Insomnia-stable Continue sleep hygiene techniques  Follow-up in clinic in 3 to 4 weeks or sooner if needed.  I have spent atleast  20 minutes face to face by video with patient today. More than 50 % of the time was spent for preparing to see the patient ( e.g., review of test, records ),  ordering medications and test ,psychoeducation and supportive psychotherapy and care coordination,as well as documenting clinical information in electronic health record. This note was generated in part or whole with voice recognition software. Voice recognition is usually quite accurate but there are transcription errors that can and very often do occur. I apologize for any typographical errors that were not  detected and corrected.        Candice LongsSaramma Aubrynn Katona, MD 08/15/2020, 7:53 AM

## 2020-08-15 ENCOUNTER — Encounter: Payer: Self-pay | Admitting: Psychiatry

## 2020-08-16 ENCOUNTER — Other Ambulatory Visit: Payer: Self-pay | Admitting: Internal Medicine

## 2020-08-21 DIAGNOSIS — R5383 Other fatigue: Secondary | ICD-10-CM | POA: Diagnosis not present

## 2020-08-24 ENCOUNTER — Encounter: Payer: Self-pay | Admitting: Physician Assistant

## 2020-08-24 ENCOUNTER — Other Ambulatory Visit: Payer: Self-pay | Admitting: Physician Assistant

## 2020-08-24 ENCOUNTER — Other Ambulatory Visit: Payer: Self-pay

## 2020-08-24 ENCOUNTER — Encounter: Payer: Self-pay | Admitting: *Deleted

## 2020-08-24 ENCOUNTER — Ambulatory Visit: Payer: 59 | Admitting: Physician Assistant

## 2020-08-24 VITALS — BP 123/82 | HR 101 | Wt 145.0 lb

## 2020-08-24 DIAGNOSIS — G43009 Migraine without aura, not intractable, without status migrainosus: Secondary | ICD-10-CM

## 2020-08-24 MED ORDER — HYDROCODONE-ACETAMINOPHEN 10-325 MG PO TABS
1.0000 | ORAL_TABLET | Freq: Four times a day (QID) | ORAL | 0 refills | Status: DC | PRN
Start: 2020-08-24 — End: 2021-03-01

## 2020-08-24 MED ORDER — IBUPROFEN 800 MG PO TABS: 800.0000 mg | ORAL_TABLET | Freq: Three times a day (TID) | ORAL | 1 refills | Status: DC | PRN

## 2020-08-24 MED ORDER — UBRELVY 100 MG PO TABS: 100.0000 mg | ORAL_TABLET | ORAL | 11 refills | Status: DC | PRN

## 2020-08-24 MED ORDER — ONDANSETRON HCL 8 MG PO TABS: 8.0000 mg | ORAL_TABLET | Freq: Three times a day (TID) | ORAL | 0 refills | Status: DC | PRN

## 2020-08-24 MED ORDER — CYCLOBENZAPRINE HCL 10 MG PO TABS: 10.0000 mg | ORAL_TABLET | Freq: Three times a day (TID) | ORAL | 2 refills | Status: DC | PRN

## 2020-08-24 MED ORDER — ELETRIPTAN HYDROBROMIDE 40 MG PO TABS: ORAL_TABLET | ORAL | 11 refills | Status: DC

## 2020-08-24 NOTE — Progress Notes (Signed)
History:  Candice [name] is a 34 y.o. G1P1001 who presents to clinic today for migraine followup.  She feels like the bad migraines are less frequent.  She has not been here in some time and therefore does not have all of the medications she would normally have as tools.  She gets a really bad one about once every 3 months.  She is using excedrin, relpax, nsaid, nurtec, phenergan.  She often uses relpax and nurtec together.  Nurtec not working She has been putting her daughter's needs ahead of her own.    Past Medical History:  Diagnosis Date   Anxiety    Depression    Hearing loss    Migraines    a. x 10+ years.   Postpartum care following cesarean delivery (5/29) 02/04/2014   Pre-syncope    a. 07/2015 [facility] ED visit, ? vasovagal, improved with IVF.   Short cervix in second trimester, antepartum 11/16/2013    Social History   Socioeconomic History   Marital status: Married    Spouse name: Not on file   Number of children: 1   Years of education: Not on file   Highest education level: Not on file  Occupational History   Not on file  Tobacco Use   Smoking status: Never Smoker   Smokeless tobacco: Never Used  Vaping Use   Vaping Use: Never used  Substance and Sexual Activity   Alcohol use: Yes    Alcohol/week: 0.0 standard drinks    Comment: maybe two drinks/month.   Drug use: No   Sexual activity: Yes    Birth control/protection: Pill  Other Topics Concern   Not on file  Social History Narrative   Lives in [place] with her husband and 52 month old dtr.  Does not routinely exercise.       Social Determinants of Health   Financial Resource Strain: Not on file  Food Insecurity: Not on file  Transportation Needs: Not on file  Physical Activity: Not on file  Stress: Not on file  Social Connections: Not on file  Intimate Partner Violence: Not on file    Family History  Problem Relation Age of Onset   Depression Mother    Anxiety disorder  Mother    Heart attack Mother        NSTEMI @ age 4.   Alcohol abuse Paternal Grandfather    Suicidality Paternal Grandfather    Sleep apnea Father        alive @ 4.   Heart attack Maternal Grandfather    Anxiety disorder Maternal Grandmother    Depression Maternal Grandmother    Diabetes Maternal Grandmother    Hypertension Maternal Grandmother     Allergies  Allergen Reactions   Sulfa Antibiotics Other (See Comments) and Hives   Sulfur Hives    Current Outpatient Medications on File Prior to Visit  Medication Sig Dispense Refill   ALPRAZolam (XANAX) 0.25 MG tablet Take 1 tablet (0.25 mg total) by mouth daily as needed for anxiety. Only for severe anxiety attacks 12 tablet 1   buPROPion (WELLBUTRIN SR) 150 MG 12 hr tablet Take 1 tablet (150 mg total) by mouth 2 (two) times daily. Start taking 1 tablet in the AM for 1 week and take 1 tablet twice daily after that 60 tablet 1   cyclobenzaprine (FLEXERIL) 10 MG tablet TAKE 1 TABLET BY MOUTH EVERY 8 HOURS AS NEEDED FOR MUSCLE SPASMS. 30 tablet 1   eletriptan (RELPAX) 40 MG tablet  TAKE 1 TABLET BY MOUTH AS NEEDED FOR MIGRAINE OR HEADACHE. MAY REPEAT IN 2 HOURS IF HEADACHE PERSISTS OR RECURS. 10 tablet 11   escitalopram (LEXAPRO) 20 MG tablet Take 1 tablet (20 mg total) by mouth daily. 90 tablet 0   fluticasone (FLONASE) 50 MCG/ACT nasal spray Place 2 sprays into both nostrils daily. 16 g 0   ibuprofen (ADVIL) 800 MG tablet      levonorgestrel-ethinyl estradiol (KURVELO) 0.15-30 MG-MCG tablet Take 1 tablet by mouth daily. Active pills only 3 Package 5   NURTEC 75 MG TBDP TAKE 1 TABLET BY MOUTH ONCE DAILY AS NEEDED. 8 tablet 2   ondansetron (ZOFRAN) 8 MG tablet Take 8 mg by mouth every 8 (eight) hours as needed.     ondansetron (ZOFRAN-ODT) 4 MG disintegrating tablet DISSOLVE 1 TABLET BY MOUTH EVERY 8 (EIGHT) HOURS AS NEEDED FOR NAUSEA OR VOMITING. 20 tablet 6   promethazine (PHENERGAN) 25 MG tablet Take 1 tablet  (25 mg total) by mouth every 8 (eight) hours as needed for nausea or vomiting. 30 tablet 0   propranolol ER (INDERAL LA) 60 MG 24 hr capsule Take by mouth.     valACYclovir (VALTREX) 1000 MG tablet Take 2 tablets (2,000 mg total) by mouth 2 (two) times daily. 4 tablet 1   promethazine (PHENERGAN) 25 MG suppository Place 1 suppository (25 mg total) rectally every 6 (six) hours as needed for nausea or vomiting. (Patient not taking: No sig reported) 12 each 0   SUMAtriptan (IMITREX) 100 MG tablet Take 1 tablet (100 mg total) by mouth as needed. (Patient not taking: Reported on 08/24/2020) 12 tablet 11   SUMAtriptan 6 MG/0.5ML SOAJ Inject 1 cartridge into the skin as needed (migraine). May repeat in 2 hours.  No more than 2 doses of Imitrex per day. (Patient not taking: Reported on 08/24/2020) 12 Cartridge 3   No current facility-administered medications on file prior to visit.     Review of Systems:  All pertinent positive/negative included in HPI, all other review of systems are negative   Objective:  Physical Exam BP 123/82    Pulse (!) 101    Wt 145 lb (65.8 kg)    BMI 24.89 kg/m  CONSTITUTIONAL: Well-developed, well-nourished female in no acute distress.  EYES: EOM intact ENT: Normocephalic CARDIOVASCULAR: Regular rate RESPIRATORY: Normal rate. MUSCULOSKELETAL: Normal ROM SKIN: Warm, dry without erythema  NEUROLOGICAL: Alert, oriented, CN II-XII grossly intact, Appropriate balance PSYCH: Normal behavior, mood   Assessment & Plan:  Assessment: Migraine without aura, worsened  Plan: Restart Propanolol for prevention of migraine (and may help tachycardia as well) When taking Relpax, add ibuprofen for improved efficacy Trial Ubrelvy in place of Nurtec which may be able to replace Relpax Follow-up in 3 months or sooner PRN  Bertram Denver, PA-C 08/24/2020 8:29 AM

## 2020-08-27 ENCOUNTER — Telehealth: Payer: 59 | Admitting: Physician Assistant

## 2020-08-27 ENCOUNTER — Other Ambulatory Visit: Payer: Self-pay | Admitting: Physician Assistant

## 2020-08-27 DIAGNOSIS — H5789 Other specified disorders of eye and adnexa: Secondary | ICD-10-CM | POA: Diagnosis not present

## 2020-08-27 MED ORDER — OFLOXACIN 0.3 % OP SOLN: 1.0000 [drp] | Freq: Four times a day (QID) | OPHTHALMIC | 0 refills | Status: DC

## 2020-08-27 NOTE — Progress Notes (Signed)
E-Visit for [employer]   We are sorry that you are not feeling well.  Here is how we plan to help!  Based on what you have shared with me it looks like you have conjunctivitis.  Conjunctivitis is a common inflammatory or infectious condition of the eye that is often referred to as "pink eye".  In most cases it is contagious (viral or bacterial). However, not all conjunctivitis requires antibiotics (ex. Allergic).  We have made appropriate suggestions for you based upon your presentation.  I have prescribed Oflaxacin 1-2 drops 4 times a day times 5 days  Falling asleep with your contact can cause a corneal ulcer.  If your symptoms do not improve/resolve withing 24 hours you need an urgent ophthalmology evaluation.  If your symptoms worsen in any way or you develop blurred vision, pain, fever or other symptoms you need an immediate eye exam in a face-to-face encounter.  Pink eye can be highly contagious.  It is typically spread through direct contact with secretions, or contaminated objects or surfaces that one may have touched.  Strict handwashing is suggested with soap and water is urged.  If not available, use alcohol based had sanitizer.  Avoid unnecessary touching of the eye.  If you wear contact lenses, you will need to refrain from wearing them until you see no white discharge from the eye for at least 24 hours after being on medication.  You should see symptom improvement in 1-2 days after starting the medication regimen.  Call us if symptoms are not improved in 1-2 days.  Home Care:  Wash your hands often!  Do not wear your contacts until you complete your treatment plan.  Avoid sharing towels, bed linen, personal items with a person who has pink eye.  See attention for anyone in your home with similar symptoms.  Get Help Right Away If:  Your symptoms do not improve.  You develop blurred or loss of vision.  Your symptoms worsen (increased discharge, pain or redness)  Your e-visit  answers were reviewed by a board certified advanced clinical practitioner to complete your personal care plan.  Depending on the condition, your plan could have included both over the counter or prescription medications.  If there is a problem please reply  once you have received a response from your provider.  Your safety is important to [place].  If you have drug allergies check your prescription carefully.    You can use MyChart to ask questions about today's visit, request a non-urgent call back, or ask for a work or school excuse for 24 hours related to this e-Visit. If it has been greater than 24 hours you will need to follow up with your provider, or enter a new e-Visit to address those concerns.   You will get an e-mail in the next two days asking about your experience.  I hope that your e-visit has been valuable and will speed your recovery. Thank you for using e-visits.    Greater than 5 minutes, yet less than 10 minutes of time have been spent researching, coordinating, and implementing care for this patient today

## 2020-09-13 ENCOUNTER — Telehealth (INDEPENDENT_AMBULATORY_CARE_PROVIDER_SITE_OTHER): Payer: 59 | Admitting: Psychiatry

## 2020-09-13 ENCOUNTER — Other Ambulatory Visit: Payer: Self-pay | Admitting: Psychiatry

## 2020-09-13 ENCOUNTER — Encounter: Payer: Self-pay | Admitting: Psychiatry

## 2020-09-13 ENCOUNTER — Other Ambulatory Visit: Payer: Self-pay

## 2020-09-13 DIAGNOSIS — F330 Major depressive disorder, recurrent, mild: Secondary | ICD-10-CM | POA: Diagnosis not present

## 2020-09-13 DIAGNOSIS — F410 Panic disorder [episodic paroxysmal anxiety] without agoraphobia: Secondary | ICD-10-CM | POA: Diagnosis not present

## 2020-09-13 DIAGNOSIS — F5105 Insomnia due to other mental disorder: Secondary | ICD-10-CM | POA: Diagnosis not present

## 2020-09-13 MED ORDER — ESCITALOPRAM OXALATE 20 MG PO TABS
20.0000 mg | ORAL_TABLET | Freq: Every day | ORAL | 0 refills | Status: DC
Start: 2020-09-13 — End: 2020-09-13

## 2020-09-13 NOTE — Progress Notes (Signed)
Virtual Visit via Video Note  I connected with Candice [name] on 09/13/20 at  4:00 PM EST by a video enabled telemedicine application and verified that I am speaking with the correct person using two identifiers.  Location Provider Location : [facility] Patient Location : Work  Participants: Patient , Provider    I discussed the limitations of evaluation and management by telemedicine and the availability of in person appointments. The patient expressed understanding and agreed to proceed.    I discussed the assessment and treatment plan with the patient. The patient was provided an opportunity to ask questions and all were answered. The patient agreed with the plan and demonstrated an understanding of the instructions.   The patient was advised to call back or seek an in-person evaluation if the symptoms worsen or if the condition fails to improve as anticipated.   [facility] MD OP Progress Note  09/13/2020 4:55 PM Candice [name]  MRN:  [id number]  Chief Complaint:  Chief Complaint    Follow-up     HPI: Candice [name] is a 35 year old Caucasian female, employed, lives in [place], has a history of MDD, panic attacks, insomnia, bilateral hearing loss, migraine headaches was evaluated by telemedicine today.  Patient today reports she is currently making progress with regards to her mood.  She reports the holidays went well.  She reports the relationship with her husband is going in a better direction at this time.  She reports she has been taking her Wellbutrin only once a day most of the time since she has been having trouble remembering to take the second dose.  She however is willing to start taking it twice a day and wants to give it a chance.  She is not interested in changing the Wellbutrin at this time.  Patient reports she is compliant on her Lexapro.  She denies side effects.  Patient reports she forgot to schedule the appointment with therapist however is willing to do  that.  She reports work is busy.  Patient denies any suicidality, homicidality or perceptual disturbances.  Patient denies any other concerns today.  Visit Diagnosis:    ICD-10-CM   1. MDD (major depressive disorder), recurrent episode, mild (HCC)  F33.0 escitalopram (LEXAPRO) 20 MG tablet   improving  2. Panic attacks  F41.0   3. Insomnia due to mental condition  F51.05     Past Psychiatric History: I have reviewed past psychiatric history from my progress note on 01/10/2019  Past Medical History:  Past Medical History:  Diagnosis Date  . Anxiety   . Depression   . Hearing loss   . Migraines    a. x 10+ years.  . Postpartum care following cesarean delivery (5/29) 02/04/2014  . Pre-syncope    a. 07/2015 [facility] ED visit, ? vasovagal, improved with IVF.  [name] Short cervix in second trimester, antepartum 11/16/2013    Past Surgical History:  Procedure Laterality Date  . CESAREAN SECTION N/A 02/03/2014   Procedure: Primary Cesarean Section Delivery Baby Girl @ 2244, Apgars 9/9;  Surgeon: Serita Kyle, MD;  Location: [facility];  Service: Obstetrics;  Laterality: N/A;  . CESAREAN SECTION    . WISDOM TOOTH EXTRACTION  2002    Family Psychiatric History: I have reviewed family psychiatric history from my progress note on 01/10/2019  Family History:  Family History  Problem Relation Age of Onset  . Depression Mother   . Anxiety disorder Mother   . Heart attack Mother  NSTEMI @ age 48.  [name] Alcohol abuse Paternal Grandfather   . Suicidality Paternal Grandfather   . Sleep apnea Father        alive @ 71.  [name] Heart attack Maternal Grandfather   . Anxiety disorder Maternal Grandmother   . Depression Maternal Grandmother   . Diabetes Maternal Grandmother   . Hypertension Maternal Grandmother     Social History: Reviewed social history from my progress note on 01/10/2019 Social History   Socioeconomic History  . Marital status: Married    Spouse name: Not on file  .  Number of children: 1  . Years of education: Not on file  . Highest education level: Not on file  Occupational History  . Not on file  Tobacco Use  . Smoking status: Never Smoker  . Smokeless tobacco: Never Used  Vaping Use  . Vaping Use: Never used  Substance and Sexual Activity  . Alcohol use: Yes    Alcohol/week: 0.0 standard drinks    Comment: maybe two drinks/month.  . Drug use: No  . Sexual activity: Yes    Birth control/protection: Pill  Other Topics Concern  . Not on file  Social History Narrative   Lives in [place] with her husband and 66 month old dtr.  Does not routinely exercise.       Social Determinants of Health   Financial Resource Strain: Not on file  Food Insecurity: Not on file  Transportation Needs: Not on file  Physical Activity: Not on file  Stress: Not on file  Social Connections: Not on file    Allergies:  Allergies  Allergen Reactions  . Sulfa Antibiotics Other (See Comments) and Hives  . Sulfur Hives    Metabolic Disorder Labs: No results found for: HGBA1C, MPG No results found for: PROLACTIN No results found for: CHOL, TRIG, HDL, CHOLHDL, VLDL, LDLCALC Lab Results  Component Value Date   TSH 0.696 07/12/2015    Therapeutic Level Labs: No results found for: LITHIUM No results found for: VALPROATE No components found for:  CBMZ  Current Medications: Current Outpatient Medications  Medication Sig Dispense Refill  . ALPRAZolam (XANAX) 0.25 MG tablet Take 1 tablet (0.25 mg total) by mouth daily as needed for anxiety. Only for severe anxiety attacks 12 tablet 1  . buPROPion (WELLBUTRIN SR) 150 MG 12 hr tablet Take 1 tablet (150 mg total) by mouth 2 (two) times daily. Start taking 1 tablet in the AM for 1 week and take 1 tablet twice daily after that 60 tablet 1  . cyclobenzaprine (FLEXERIL) 10 MG tablet TAKE 1 TABLET BY MOUTH EVERY 8 HOURS AS NEEDED FOR MUSCLE SPASMS. 30 tablet 1  . cyclobenzaprine (FLEXERIL) 10 MG tablet Take 1 tablet  (10 mg total) by mouth every 8 (eight) hours as needed for muscle spasms. 40 tablet 2  . eletriptan (RELPAX) 40 MG tablet TAKE 1 TABLET BY MOUTH AS NEEDED FOR MIGRAINE OR HEADACHE. MAY REPEAT IN 2 HOURS IF HEADACHE PERSISTS OR RECURS. 10 tablet 11  . escitalopram (LEXAPRO) 20 MG tablet Take 1 tablet (20 mg total) by mouth daily. 90 tablet 0  . fluticasone (FLONASE) 50 MCG/ACT nasal spray Place 2 sprays into both nostrils daily. 16 g 0  . HYDROcodone-acetaminophen (NORCO) 10-325 MG tablet Take 1 tablet by mouth every 6 (six) hours as needed. 10 tablet 0  . ibuprofen (ADVIL) 800 MG tablet Take 1 tablet (800 mg total) by mouth every 8 (eight) hours as needed. 60 tablet 1  .  levonorgestrel-ethinyl estradiol (KURVELO) 0.15-30 MG-MCG tablet Take 1 tablet by mouth daily. Active pills only 3 Package 5  . NURTEC 75 MG TBDP TAKE 1 TABLET BY MOUTH ONCE DAILY AS NEEDED. 8 tablet 2  . ofloxacin (OCUFLOX) 0.3 % ophthalmic solution Place 1 drop into the right eye 4 (four) times daily. 5 mL 0  . ondansetron (ZOFRAN) 8 MG tablet Take 1 tablet (8 mg total) by mouth every 8 (eight) hours as needed. 20 tablet 0  . ondansetron (ZOFRAN-ODT) 4 MG disintegrating tablet DISSOLVE 1 TABLET BY MOUTH EVERY 8 (EIGHT) HOURS AS NEEDED FOR NAUSEA OR VOMITING. 20 tablet 6  . promethazine (PHENERGAN) 25 MG suppository Place 1 suppository (25 mg total) rectally every 6 (six) hours as needed for nausea or vomiting. (Patient not taking: No sig reported) 12 each 0  . promethazine (PHENERGAN) 25 MG tablet Take 1 tablet (25 mg total) by mouth every 8 (eight) hours as needed for nausea or vomiting. 30 tablet 0  . propranolol ER (INDERAL LA) 60 MG 24 hr capsule Take by mouth.    . SUMAtriptan (IMITREX) 100 MG tablet Take 1 tablet (100 mg total) by mouth as needed. (Patient not taking: Reported on 08/24/2020) 12 tablet 11  . SUMAtriptan 6 MG/0.5ML SOAJ Inject 1 cartridge into the skin as needed (migraine). May repeat in 2 hours.  No more than 2  doses of Imitrex per day. (Patient not taking: Reported on 08/24/2020) 12 Cartridge 3  . Ubrogepant (UBRELVY) 100 MG TABS Take 100 mg by mouth as needed. 10 tablet 11  . valACYclovir (VALTREX) 1000 MG tablet Take 2 tablets (2,000 mg total) by mouth 2 (two) times daily. 4 tablet 1   No current facility-administered medications for this visit.     Musculoskeletal: Strength & Muscle Tone: UTA Gait & Station: UTA Patient leans: N/A  Psychiatric Specialty Exam: Review of Systems  Psychiatric/Behavioral: Positive for dysphoric mood.  All other systems reviewed and are negative.   There were no vitals taken for this visit.There is no height or weight on file to calculate BMI.  General Appearance: Casual  Eye Contact:  Fair  Speech:  Clear and Coherent  Volume:  Normal  Mood:  Dysphoric improving  Affect:  Congruent  Thought Process:  Goal Directed and Descriptions of Associations: Intact  Orientation:  Full (Time, Place, and Person)  Thought Content: Logical   Suicidal Thoughts:  No  Homicidal Thoughts:  No  Memory:  Immediate;   Fair Recent;   Fair Remote;   Fair  Judgement:  Fair  Insight:  Fair  Psychomotor Activity:  Normal  Concentration:  Concentration: Fair and Attention Span: Fair  Recall:  [employer] of Knowledge: Fair  Language: Fair  Akathisia:  No  Handed:  Left  AIMS (if indicated): UTA  Assets:  Communication Skills Desire for Improvement Social Support Talents/Skills Transportation Vocational/Educational  ADL's:  Intact  Cognition: WNL  Sleep:  Fair   Screenings: PHQ2-9   Flowsheet Row Video Visit from [facility]/14/2020 in [facility]  PHQ-2 Total Score 2  PHQ-9 Total Score 8       Assessment and Plan: Candice [name] is a 35 year old Caucasian female, employed, married, has a history of depression, anxiety was evaluated by telemedicine today.  Patient is currently making progress with regards to her mood.  Denies any  suicidality.  She however has been noncompliant with medications as recommended as well as psychotherapy sessions.  Discussed plan as noted below.  Plan MDD-some progress Continue Lexapro 20 mg p.o. daily Encouraged compliance on Wellbutrin SR 150 mg p.o. twice daily.  She has been taking it only once a day.  If she continues to have trouble with compliance will consider changing Wellbutrin to Wellbutrin XL.  This was discussed with patient.  Anxiety attacks-improving Continue Lexapro as prescribed Patient was referred for CBT-noncompliant.  She agrees to give the clinic a call to schedule the appointment. Continue Xanax 0.25 mg p.o. daily as needed for severe panic symptoms.  Insomnia-stable Continue sleep hygiene techniques  Follow-up in clinic in 6 weeks or sooner if needed.  I have spent atleast 20 minutes face to face by video with patient today. More than 50 % of the time was spent for preparing to see the patient ( e.g., review of test, records ), ordering medications and test ,psychoeducation and supportive psychotherapy and care coordination,as well as documenting clinical information in electronic health record. This note was generated in part or whole with voice recognition software. Voice recognition is usually quite accurate but there are transcription errors that can and very often do occur. I apologize for any typographical errors that were not detected and corrected.      Ursula Alert, MD 09/13/2020, 4:55 PM

## 2020-09-20 ENCOUNTER — Other Ambulatory Visit: Payer: Self-pay | Admitting: Internal Medicine

## 2020-10-11 ENCOUNTER — Telehealth: Payer: Self-pay | Admitting: Licensed Clinical Social Worker

## 2020-10-17 ENCOUNTER — Other Ambulatory Visit: Payer: Self-pay | Admitting: Physician Assistant

## 2020-10-17 ENCOUNTER — Other Ambulatory Visit: Payer: Self-pay | Admitting: Psychiatry

## 2020-10-17 DIAGNOSIS — F330 Major depressive disorder, recurrent, mild: Secondary | ICD-10-CM

## 2020-10-18 ENCOUNTER — Other Ambulatory Visit: Payer: Self-pay | Admitting: Psychiatry

## 2020-10-19 ENCOUNTER — Other Ambulatory Visit: Payer: Self-pay | Admitting: Physician Assistant

## 2020-10-29 ENCOUNTER — Ambulatory Visit: Payer: 59 | Admitting: Licensed Clinical Social Worker

## 2020-10-30 ENCOUNTER — Encounter: Payer: Self-pay | Admitting: Psychiatry

## 2020-10-30 ENCOUNTER — Other Ambulatory Visit: Payer: Self-pay

## 2020-10-30 ENCOUNTER — Telehealth (INDEPENDENT_AMBULATORY_CARE_PROVIDER_SITE_OTHER): Payer: 59 | Admitting: Psychiatry

## 2020-10-30 DIAGNOSIS — F410 Panic disorder [episodic paroxysmal anxiety] without agoraphobia: Secondary | ICD-10-CM | POA: Diagnosis not present

## 2020-10-30 DIAGNOSIS — F3342 Major depressive disorder, recurrent, in full remission: Secondary | ICD-10-CM

## 2020-10-30 DIAGNOSIS — F330 Major depressive disorder, recurrent, mild: Secondary | ICD-10-CM | POA: Insufficient documentation

## 2020-10-30 DIAGNOSIS — F5105 Insomnia due to other mental disorder: Secondary | ICD-10-CM

## 2020-10-30 NOTE — Progress Notes (Signed)
Virtual Visit via Video Note  I connected with Candice FitchStephanie T Mineer on 10/30/20 at  4:00 PM EST by a video enabled telemedicine application and verified that I am speaking with the correct person using two identifiers.  Location Provider Location : [facility] Patient Location : Home  Participants: Patient , Provider    I discussed the limitations of evaluation and management by telemedicine and the availability of in person appointments. The patient expressed understanding and agreed to proceed.   I discussed the assessment and treatment plan with the patient. The patient was provided an opportunity to ask questions and all were answered. The patient agreed with the plan and demonstrated an understanding of the instructions.   The patient was advised to call back or seek an in-person evaluation if the symptoms worsen or if the condition fails to improve as anticipated.   [facility] MD OP Progress Note  10/30/2020 9:04 PM Candice FitchStephanie T Pinkard  MRN:  [id number]  Chief Complaint:  Chief Complaint    Follow-up     HPI: Candice [name] is a 35 year old Caucasian female employed, lives in [place], has a history of MDD, panic attack, insomnia, bilateral hearing loss, migraine headaches was evaluated by telemedicine today.  Patient today reports depressive symptoms have improved.  She does not feel as depressed as she used to before.  She however does feel overwhelmed and anxious on and off.  She however reports in spite of that she is able to function well at work.  She reports sleep is overall okay.  Patient denies any suicidality, homicidality or perceptual disturbances.  She is compliant on medications as prescribed.  She reports she has been more compliant with the Wellbutrin afternoon dosage and that does seem to help.  She continues to struggle with her relationship with her husband however she reports overall it is getting better.  They have already planned several activities they can to do  together over the next several months.  She is also trying to find a sex therapist.  She reports she is interested in starting psychotherapy session with therapist at our practice, [facility].  She has upcoming appointment scheduled.  Patient denies any suicidality, homicidality or perceptual disturbances.  Visit Diagnosis:    ICD-10-CM   1. MDD (major depressive disorder), recurrent, in full remission (HCC)  F33.42   2. Panic attacks  F41.0   3. Insomnia due to mental condition  F51.05     Past Psychiatric History: I have reviewed past psychiatric history from my progress note on 01/10/2019  Past Medical History:  Past Medical History:  Diagnosis Date   Anxiety    Depression    Hearing loss    Migraines    a. x 10+ years.   Postpartum care following cesarean delivery (5/29) 02/04/2014   Pre-syncope    a. 07/2015 [facility] ED visit, ? vasovagal, improved with IVF.   Short cervix in second trimester, antepartum 11/16/2013    Past Surgical History:  Procedure Laterality Date   CESAREAN SECTION N/A 02/03/2014   Procedure: Primary Cesarean Section Delivery Baby Girl @ 2244, Apgars 9/9;  Surgeon: Serita KyleSheronette A Cousins, MD;  Location: [facility];  Service: Obstetrics;  Laterality: N/A;   CESAREAN SECTION     WISDOM TOOTH EXTRACTION  2002    Family Psychiatric History: I have reviewed family psychiatric history from my progress note on 01/10/2019  Family History:  Family History  Problem Relation Age of Onset   Depression Mother    Anxiety disorder  Mother    Heart attack Mother        NSTEMI @ age 57.   Alcohol abuse Paternal Grandfather    Suicidality Paternal Grandfather    Sleep apnea Father        alive @ 67.   Heart attack Maternal Grandfather    Anxiety disorder Maternal Grandmother    Depression Maternal Grandmother    Diabetes Maternal Grandmother    Hypertension Maternal Grandmother     Social History: I have reviewed social history from my progress note on  01/10/2019 Social History   Socioeconomic History   Marital status: Married    Spouse name: Not on file   Number of children: 1   Years of education: Not on file   Highest education level: Not on file  Occupational History   Not on file  Tobacco Use   Smoking status: Never Smoker   Smokeless tobacco: Never Used  Vaping Use   Vaping Use: Never used  Substance and Sexual Activity   Alcohol use: Yes    Alcohol/week: 0.0 standard drinks    Comment: maybe two drinks/month.   Drug use: No   Sexual activity: Yes    Birth control/protection: Pill  Other Topics Concern   Not on file  Social History Narrative   Lives in [place] with her husband and 23 month old dtr.  Does not routinely exercise.       Social Determinants of Health   Financial Resource Strain: Not on file  Food Insecurity: Not on file  Transportation Needs: Not on file  Physical Activity: Not on file  Stress: Not on file  Social Connections: Not on file    Allergies:  Allergies  Allergen Reactions   Elemental Sulfur Hives   Sulfa Antibiotics Other (See Comments) and Hives    Metabolic Disorder Labs: No results found for: HGBA1C, MPG No results found for: PROLACTIN No results found for: CHOL, TRIG, HDL, CHOLHDL, VLDL, LDLCALC Lab Results  Component Value Date   TSH 0.696 07/12/2015    Therapeutic Level Labs: No results found for: LITHIUM No results found for: VALPROATE No components found for:  CBMZ  Current Medications: Current Outpatient Medications  Medication Sig Dispense Refill   ALPRAZolam (XANAX) 0.25 MG tablet Take 1 tablet (0.25 mg total) by mouth daily as needed for anxiety. Only for severe anxiety attacks 12 tablet 1   buPROPion (WELLBUTRIN SR) 150 MG 12 hr tablet Take 1 tablet (150 mg total) by mouth 2 (two) times daily. 60 tablet 1   cyclobenzaprine (FLEXERIL) 10 MG tablet TAKE 1 TABLET BY MOUTH EVERY 8 HOURS AS NEEDED FOR MUSCLE SPASMS. 30 tablet 1   cyclobenzaprine  (FLEXERIL) 10 MG tablet Take 1 tablet (10 mg total) by mouth every 8 (eight) hours as needed for muscle spasms. 40 tablet 2   eletriptan (RELPAX) 40 MG tablet TAKE 1 TABLET BY MOUTH AS NEEDED FOR MIGRAINE OR HEADACHE. MAY REPEAT IN 2 HOURS IF HEADACHE PERSISTS OR RECURS. 10 tablet 11   escitalopram (LEXAPRO) 20 MG tablet Take 1 tablet (20 mg total) by mouth daily. 90 tablet 0   fluticasone (FLONASE) 50 MCG/ACT nasal spray Place 2 sprays into both nostrils daily. 16 g 0   HYDROcodone-acetaminophen (NORCO) 10-325 MG tablet Take 1 tablet by mouth every 6 (six) hours as needed. 10 tablet 0   ibuprofen (ADVIL) 800 MG tablet Take 1 tablet (800 mg total) by mouth every 8 (eight) hours as needed. 60 tablet 1   levonorgestrel-ethinyl  estradiol (KURVELO) 0.15-30 MG-MCG tablet Take 1 tablet by mouth daily. Active pills only 3 Package 5   NURTEC 75 MG TBDP TAKE 1 TABLET BY MOUTH ONCE DAILY AS NEEDED. 8 tablet 2   ofloxacin (OCUFLOX) 0.3 % ophthalmic solution Place 1 drop into the right eye 4 (four) times daily. 5 mL 0   ondansetron (ZOFRAN) 8 MG tablet TAKE 1 TABLET (8 MG TOTAL) BY MOUTH EVERY 8 (EIGHT) HOURS AS NEEDED. 20 tablet 0   ondansetron (ZOFRAN-ODT) 4 MG disintegrating tablet DISSOLVE 1 TABLET BY MOUTH EVERY 8 (EIGHT) HOURS AS NEEDED FOR NAUSEA OR VOMITING. 20 tablet 6   promethazine (PHENERGAN) 25 MG suppository Place 1 suppository (25 mg total) rectally every 6 (six) hours as needed for nausea or vomiting. (Patient not taking: No sig reported) 12 each 0   promethazine (PHENERGAN) 25 MG tablet Take 1 tablet (25 mg total) by mouth every 8 (eight) hours as needed for nausea or vomiting. 30 tablet 0   propranolol ER (INDERAL LA) 60 MG 24 hr capsule Take by mouth.     SUMAtriptan (IMITREX) 100 MG tablet Take 1 tablet (100 mg total) by mouth as needed. (Patient not taking: Reported on 08/24/2020) 12 tablet 11   SUMAtriptan 6 MG/0.5ML SOAJ Inject 1 cartridge into the skin as needed (migraine).  May repeat in 2 hours.  No more than 2 doses of Imitrex per day. (Patient not taking: Reported on 08/24/2020) 12 Cartridge 3   Ubrogepant (UBRELVY) 100 MG TABS Take 100 mg by mouth as needed. 10 tablet 11   valACYclovir (VALTREX) 1000 MG tablet Take 2 tablets (2,000 mg total) by mouth 2 (two) times daily. 4 tablet 1   No current facility-administered medications for this visit.     Musculoskeletal: Strength & Muscle Tone: UTA Gait & Station: normal Patient leans: N/A  Psychiatric Specialty Exam: Review of Systems  Psychiatric/Behavioral: The patient is nervous/anxious.   All other systems reviewed and are negative.   There were no vitals taken for this visit.There is no height or weight on file to calculate BMI.  General Appearance: Casual  Eye Contact:  Good  Speech:  Clear and Coherent  Volume:  Normal  Mood:  Anxious Coping well  Affect:  Congruent  Thought Process:  Goal Directed and Descriptions of Associations: Intact  Orientation:  Full (Time, Place, and Person)  Thought Content: Logical   Suicidal Thoughts:  No  Homicidal Thoughts:  No  Memory:  Immediate;   Fair Recent;   Fair Remote;   Fair  Judgement:  Fair  Insight:  Fair  Psychomotor Activity:  Normal  Concentration:  Concentration: Fair and Attention Span: Fair  Recall:  [employer] of Knowledge: Fair  Language: Fair  Akathisia:  No  Handed:  Right  AIMS (if indicated): UTA  Assets:  Communication Skills Desire for Improvement Housing Social Support  ADL's:  Intact  Cognition: WNL  Sleep:  Fair   Screenings: PHQ2-9   Flowsheet Row Video Visit from 10/30/2020 in [facility] Video Visit from 08/14/2020 in [facility]  PHQ-2 Total Score 1 2  PHQ-9 Total Score -- 8    Flowsheet Row Video Visit from 10/30/2020 in [facility]  C-SSRS RISK CATEGORY No Risk       Assessment and Plan: MARUA QIN is a  35 year old Caucasian female, employed, married, has a history of depression, anxiety was evaluated by telemedicine today.  Patient is currently making progress however  continues to have relationship struggles and will benefit from psychotherapy sessions.  Plan as noted below.  Plan MDD-in remission Lexapro 20 mg p.o. daily Wellbutrin SR 150 mg p.o. twice daily   Panic attacks-stable Lexapro as prescribed Patient advised to start CBT, she has upcoming appointment Continue Xanax 0.25 mg p.o. daily as needed for severe panic symptoms  Insomnia-stable Continue sleep hygiene techniques  Patient provided resources, psychology today, [facility] information.  Follow-up in clinic in 2 to 3 months or sooner if needed.  In the meantime patient advised to establish care with a psychotherapist and have more frequent sessions.  I have spent atleast 20 minutes face to face by video with patient today. More than 50 % of the time was spent for preparing to see the patient ( e.g., review of test, records ) , ordering medications and test ,psychoeducation and supportive psychotherapy and care coordination,as well as documenting clinical information in electronic health record. This note was generated in part or whole with voice recognition software. Voice recognition is usually quite accurate but there are transcription errors that can and very often do occur. I apologize for any typographical errors that were not detected and corrected.     Jomarie Longs, MD 10/31/2020, 9:37 AM

## 2020-11-23 ENCOUNTER — Telehealth: Payer: 59 | Admitting: Emergency Medicine

## 2020-11-23 DIAGNOSIS — B001 Herpesviral vesicular dermatitis: Secondary | ICD-10-CM | POA: Diagnosis not present

## 2020-11-23 MED ORDER — VALACYCLOVIR HCL 1 G PO TABS: 2000.0000 mg | ORAL_TABLET | Freq: Two times a day (BID) | ORAL | 0 refills | Status: AC

## 2020-11-23 NOTE — Progress Notes (Signed)
We are sorry that you are not feeling well.  Here is how we plan to help!  Based on what you have shared with me it does look like you have a viral infection.    Most cold sores or fever blisters are small fluid filled blisters around the mouth caused by herpes simplex virus.  The most common strain of the virus causing cold sores is herpes simplex virus 1.  It can be spread by skin contact, sharing eating utensils, or even sharing towels.  Cold sores are contagious to other people until dry. (Approximately 5-7 days).  Wash your hands. You can spread the virus to your eyes through handling your contact lenses after touching the lesions.  Most people experience pain at the sight or tingling sensations in their lips that may begin before the ulcers erupt.  Herpes simplex is treatable but not curable.  It may lie dormant for a long time and then reappear due to stress or prolonged sun exposure.  Many patients have success in treating their cold sores with an over the counter topical called Abreva.  You may apply the cream up to 5 times daily (maximum 10 days) until healing occurs.  If you would like to use an oral antiviral medication to speed the healing of your cold sore, I have sent a prescription to your local pharmacy Valacyclovir 2 gm take one by mouth twice a day for 1 day.    HOME CARE:  Wash your hands frequently. Do not pick at or rub the sore. Don't open the blisters. Avoid kissing other people during this time. Avoid sharing drinking glasses, eating utensils, or razors. Do not handle contact lenses unless you have thoroughly washed your hands with soap and warm water! Avoid oral sex during this time.  Herpes from sores on your mouth can spread to your partner's genital area. Avoid contact with anyone who has eczema or a weakened immune system. Cold sores are often triggered by exposure to intense sunlight, use a lip balm containing a sunscreen (SPF 30 or higher).  GET HELP RIGHT AWAY  IF:  Blisters look infected. Blisters occur near or in the eye. Symptoms last longer than 10 days. Your symptoms become worse.  MAKE SURE YOU:  Understand these instructions. Will watch your condition. Will get help right away if you are not doing well or get worse.    Your e-visit answers were reviewed by a board certified advanced clinical practitioner to complete your personal care plan.  Depending upon the condition, your plan could have  Included both over the counter or prescription medications.    Please review your pharmacy choice.  Be sure that the pharmacy you have chosen is open so that you can pick up your prescription now.  If there is a problem you can message your provider in MyChart to have the prescription routed to another pharmacy.    Your safety is important to us.  If you have drug allergies check our prescription carefully.  For the next 24 hours you can use MyChart to ask questions about today's visit, request a non-urgent call back, or ask for a work or school excuse from your e-visit provider.  You will get an email in the next two days asking about your experience.  I hope that your e-visit has been valuable and will speed your recovery.  Approximately 5 minutes was spent documenting and reviewing patient's chart.    

## 2020-11-28 ENCOUNTER — Ambulatory Visit (INDEPENDENT_AMBULATORY_CARE_PROVIDER_SITE_OTHER): Payer: 59 | Admitting: Licensed Clinical Social Worker

## 2020-11-28 ENCOUNTER — Other Ambulatory Visit: Payer: Self-pay

## 2020-11-28 DIAGNOSIS — F410 Panic disorder [episodic paroxysmal anxiety] without agoraphobia: Secondary | ICD-10-CM

## 2020-11-28 DIAGNOSIS — F3342 Major depressive disorder, recurrent, in full remission: Secondary | ICD-10-CM | POA: Diagnosis not present
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
## 2020-11-28 NOTE — Patient Instructions (Addendum)
1.  Book recommendation: "Disarming the Narcissist: Surviving and thriving with the self-absorbed" Christeen Douglas    Mindfulness-Based Stress Reduction Mindfulness-based stress reduction (MBSR) is a program that helps people learn to practice mindfulness. Mindfulness is the practice of intentionally paying attention to the present moment. MBSR focuses on developing self-awareness, which allows you to respond to life stress without judgment or negative emotions. It can be learned and practiced through techniques such as education, breathing exercises, meditation, and yoga. MBSR includes several mindfulness techniques in one program. MBSR works best when you understand the treatment, are willing to try new things, and can commit to spending time practicing what you learn. MBSR training may include learning about:  How your emotions, thoughts, and reactions affect your body.  New ways to respond to things that cause negative thoughts to start (triggers).  How to notice your thoughts and let go of them.  Practicing awareness of everyday things that you normally do without thinking.  The techniques and goals of different types of meditation. What are the benefits of MBSR? MBSR can have many benefits, which include helping you to:  Develop self-awareness. This refers to knowing and understanding yourself.  Learn skills and attitudes that help you to participate in your own health care.  Learn new ways to care for yourself.  Be more accepting about how things are, and let things go.  Be less judgmental and approach things with an open mind.  Be patient with yourself and trust yourself more. MBSR has also been shown to:  Reduce negative emotions, such as depression and anxiety.  Improve memory and focus.  Change how you sense and approach pain.  Boost your body's ability to fight infections.  Help you connect better with other people.  Improve your sense of well-being. Follow these  instructions at home:  Find a local in-person or online MBSR program.  Set aside some time regularly for mindfulness practice.  Find a mindfulness practice that works best for you. This may include one or more of the following: ? Meditation. Meditation involves focusing your mind on a certain thought or activity. ? Breathing awareness exercises. These help you to stay present by focusing on your breath. ? Body scan. For this practice, you lie down and pay attention to each part of your body from head to toe. You can identify tension and soreness and intentionally relax parts of your body. ? Yoga. Yoga involves stretching and breathing, and it can improve your ability to move and be flexible. It can also provide an experience of testing your body's limits, which can help you release stress. ? Mindful eating. This way of eating involves focusing on the taste, texture, color, and smell of each bite of food. Because this slows down eating and helps you feel full sooner, it can be an important part of a weight-loss plan.  Find a podcast or recording that provides guidance for breathing awareness, body scan, or meditation exercises. You can listen to these any time when you have a free moment to rest without distractions.  Follow your treatment plan as told by your health care provider. This may include taking regular medicines and making changes to your diet or lifestyle as recommended.   How to practice mindfulness To do a basic awareness exercise:  Find a comfortable place to sit.  Pay attention to the present moment. Observe your thoughts, feelings, and surroundings just as they are.  Avoid placing judgment on yourself, your feelings, or your surroundings. Make  note of any judgment that comes up, and let it go.  Your mind may wander, and that is okay. Make note of when your thoughts drift, and return your attention to the present moment. To do basic mindfulness meditation:  Find a  comfortable place to sit. This may include a stable chair or a firm floor cushion. ? Sit upright with your back straight. Let your arms fall next to your side with your hands resting on your legs. ? If sitting in a chair, rest your feet flat on the floor. ? If sitting on a cushion, cross your legs in front of you.  Keep your head in a neutral position with your chin dropped slightly. Relax your jaw and rest the tip of your tongue on the roof of your mouth. Drop your gaze to the floor. You can close your eyes if you like.  Breathe normally and pay attention to your breath. Feel the air moving in and out of your nose. Feel your belly expanding and relaxing with each breath.  Your mind may wander, and that is okay. Make note of when your thoughts drift, and return your attention to your breath.  Avoid placing judgment on yourself, your feelings, or your surroundings. Make note of any judgment or feelings that come up, let them go, and bring your attention back to your breath.  When you are ready, lift your gaze or open your eyes. Pay attention to how your body feels after the meditation. Where to find more information You can find more information about MBSR from:  Your health care provider.  Community-based meditation centers or programs.  Programs offered near you. Summary  Mindfulness-based stress reduction (MBSR) is a program that teaches you how to intentionally pay attention to the present moment. It is used with other treatments to help you cope better with daily stress, emotions, and pain.  MBSR focuses on developing self-awareness, which allows you to respond to life stress without judgment or negative emotions.  MBSR programs may involve learning different mindfulness practices, such as breathing exercises, meditation, yoga, body scan, or mindful eating. Find a mindfulness practice that works best for you, and set aside time for it on a regular basis. This information is not  intended to replace advice given to you by your health care provider. Make sure you discuss any questions you have with your health care provider. Document Revised: 05/11/2020 Document Reviewed: 05/11/2020 Elsevier Patient Education  2021 [employer].  Caring for Your Mental Health Mental health is emotional, psychological, and social well-being. Mental health is just as important as physical health. In fact, mental and physical health are connected, and you need both to be healthy. Some signs of good mental health (well-being) include:  Being able to attend to tasks at home, school, or work.  Being able to manage stress and emotions.  Practicing self-care, which may include: ? A regular exercise pattern. ? A reasonably healthy diet. ? Supportive and trusting relationships. ? The ability to relax and calm yourself (self-calm).  Having pleasurable hobbies and activities to do.  Believing that you have meaning and purpose in your life.  Recovering and adjusting after facing challenges (resilience). You can take steps to build or strengthen these mentally healthy behaviors. There are resources and support to help you with this. Why is caring for mental health important? Caring for your mental health is a big part of staying healthy. Everyone has times when feelings, thoughts, or situations feel overwhelming. Mental health means  having the skills to manage what feels overwhelming. If this sense of being overwhelmed persists, however, you might need some help. If you have some of the following signs, you may need to take better care of your mental health or seek help from a health care provider or mental health professional:  Problems with energy or focus.  Changes in eating habits.  Problems sleeping, such as sleeping too much or not enough.  Emotional distress, such as anger, sadness, depression, or anxiety.  Major changes in your relationships.  Losing interest in life or  activities that you used to enjoy. If you have any of these symptoms on most days for 2 weeks or longer:  Talk with a close friend or family member about how you are feeling.  Contact your health care provider to discuss your symptoms.  Consider working with a Financial tradermental health professional. Your health care provider, family, or friends may be able to recommend a therapist. What can I do to promote emotional and mental health? Managing emotions  Learn to identify emotions and deal with them. Recognizing your emotions is the first step in learning to deal with them.  Practice ways to appropriately express feelings. Remember that you can control your feelings. They do not control you.  Practice stress management techniques, such as: ? Relaxation techniques, like breathing or muscle relaxation exercises. ? Exercise. Regular activity can lower your stress level. ? Changing what you can change and accepting what you cannot change.  Build up your resilience so that you can recover and adjust after big problems or challenges. Practice resilient behaviors and attitudes: ? Set and focus on long-term goals. ? Develop and maintain healthy, supportive relationships. ? Learn to accept change and make the best of the situation. ? Take care of yourself physically by eating a healthy diet, getting plenty of sleep, and exercising regularly. ? Develop self-awareness. Ask others to give feedback about how they see you. ? Practice mindfulness meditation to help you stay calm when dealing with daily challenges. ? Learn to respond to situations in healthy ways, rather than reacting with your emotions. ? Keep a positive attitude, and believe in yourself. Your view of yourself affects your mental health. ? Develop your listening and empathy skills. These will help you deal with difficult situations and communications.  Remember that emotions can be used as a good source of communication and are a great source of  energy. Try to laugh and find humor in life. Sleeping  Get the right amount and quality of sleep. Sleep has a big impact on physical and mental health. To improve your sleep: ? Go to bed and wake up around the same time every day. ? Limit screen time before bedtime. This includes the use of your cell phone, TV, computer, and tablet. ? Keep your bedroom dark and cool. Activity  Exercise or do some physical activity regularly. This helps: ? Keep your body strong, especially during times of stress. ? Get rid of chemicals in your body (hormones) that build up when you are stressed. ? Build up your resilience.   Eating and drinking  Eat a healthy diet that includes whole grains, vegetables, fresh fruits, and lean proteins. If you have questions about what foods are best for you, ask your health care provider.  Try not to turn to sweet, salty, or otherwise unhealthy foods when you are tired or unhappy. This can lead to unwanted weight gain and is not a healthy way to cope with emotions.  Where to find more information You can find more information about how to care for your mental health from:  The [employer] on Mental Illness (NAMI): www.nami.[employer] of Mental Health: http://www.maynard.net/  Centers for Disease Control and Prevention: https://www.[place].net/ Contact a health care provider if:  You lose interest in being with others or you do not want to leave the house.  You have a hard time completing your normal activities or you have less energy than normal.  You cannot stay focused or you have problems with memory.  You feel that your senses are heightened, and this makes you upset or concerned.  You feel nervous or have rapid mood changes.  You are sleeping or eating more or less than normal.  You question reality or you show odd behavior that disturbs you or others. Get help right away if:  You have thoughts about hurting yourself or others. If you  ever feel like you may hurt yourself or others, or have thoughts about taking your own life, get help right away. You can go to your nearest emergency department or call:  Your local emergency services (911 in the [place]  A suicide crisis helpline, such as the National Suicide Prevention Lifeline at 781-206-5698. This is open 24 hours a day. Summary  Mental health is not just the absence of mental illness. It involves understanding your emotions and behaviors, and taking steps to cope with them in a healthy way.  If you have symptoms of mental or emotional distress, get help from family, friends, a health care provider, or a mental health professional.  Practice good mental health behaviors such as stress management skills, self-calming skills, exercise, and healthy sleeping and eating. This information is not intended to replace advice given to you by your health care provider. Make sure you discuss any questions you have with your health care provider. Document Revised: 02/16/2020 Document Reviewed: 02/16/2020 Elsevier Patient Education  2021 [employer].

## 2020-12-25 ENCOUNTER — Telehealth: Payer: 59 | Admitting: Emergency Medicine

## 2020-12-25 ENCOUNTER — Other Ambulatory Visit: Payer: Self-pay

## 2020-12-25 DIAGNOSIS — H1030 Unspecified acute conjunctivitis, unspecified eye: Secondary | ICD-10-CM | POA: Diagnosis not present

## 2020-12-25 MED ORDER — CIPROFLOXACIN HCL 0.3 % OP SOLN
1.0000 [drp] | OPHTHALMIC | 0 refills | Status: DC
  Filled 2020-12-25: qty 5, 9d supply, fill #0

## 2020-12-25 NOTE — Progress Notes (Signed)
E-Visit for [employer]   We are sorry that you are not feeling well.  Here is how we plan to help!  Based on what you have shared with me it looks like you have conjunctivitis.  Conjunctivitis is a common inflammatory or infectious condition of the eye that is often referred to as "pink eye".  In most cases it is contagious (viral or bacterial). However, not all conjunctivitis requires antibiotics (ex. Allergic).  We have made appropriate suggestions for you based upon your presentation.  I have prescribed Cipro eye drops.  Use as directed on the label.  Remove and discard your contacts.  Use glasses for the next 5 days.  Pink eye can be highly contagious.  It is typically spread through direct contact with secretions, or contaminated objects or surfaces that one may have touched.  Strict handwashing is suggested with soap and water is urged.  If not available, use alcohol based had sanitizer.  Avoid unnecessary touching of the eye.  If you wear contact lenses, you will need to refrain from wearing them until you see no white discharge from the eye for at least 24 hours after being on medication.  You should see symptom improvement in 1-2 days after starting the medication regimen.  Call us if symptoms are not improved in 1-2 days.  Home Care:  Wash your hands often!  Do not wear your contacts until you complete your treatment plan.  Avoid sharing towels, bed linen, personal items with a person who has pink eye.  See attention for anyone in your home with similar symptoms.  Get Help Right Away If:  Your symptoms do not improve.  You develop blurred or loss of vision.  Your symptoms worsen (increased discharge, pain or redness)  Your e-visit answers were reviewed by a board certified advanced clinical practitioner to complete your personal care plan.  Depending on the condition, your plan could have included both over the counter or prescription medications.  If there is a problem  please reply  once you have received a response from your provider.  Your safety is important to [place].  If you have drug allergies check your prescription carefully.    You can use MyChart to ask questions about today's visit, request a non-urgent call back, or ask for a work or school excuse for 24 hours related to this e-Visit. If it has been greater than 24 hours you will need to follow up with your provider, or enter a new e-Visit to address those concerns.   You will get an e-mail in the next two days asking about your experience.  I hope that your e-visit has been valuable and will speed your recovery. Thank you for using e-visits.     Approximately 5 minutes was used in reviewing the patient's chart, questionnaire, prescribing medications, and documentation.

## 2020-12-28 ENCOUNTER — Telehealth: Payer: 59 | Admitting: Physician Assistant

## 2020-12-28 ENCOUNTER — Other Ambulatory Visit: Payer: Self-pay

## 2020-12-28 DIAGNOSIS — B9689 Other specified bacterial agents as the cause of diseases classified elsewhere: Secondary | ICD-10-CM

## 2020-12-28 MED ORDER — BENZONATATE 100 MG PO CAPS
100.0000 mg | ORAL_CAPSULE | Freq: Three times a day (TID) | ORAL | 0 refills | Status: DC | PRN
  Filled 2020-12-28: qty 30, 10d supply, fill #0

## 2020-12-28 MED ORDER — AZITHROMYCIN 250 MG PO TABS
ORAL_TABLET | ORAL | 0 refills | Status: AC
  Filled 2020-12-28: qty 6, 5d supply, fill #0

## 2020-12-28 NOTE — Progress Notes (Signed)
I have spent 5 minutes in review of e-visit questionnaire, review and updating patient chart, medical decision making and response to patient.   San[facility] Cody Cherisse Carrell, PA-C    

## 2020-12-28 NOTE — Progress Notes (Signed)

## 2021-01-03 ENCOUNTER — Ambulatory Visit: Payer: 59 | Admitting: Licensed Clinical Social Worker

## 2021-01-03 ENCOUNTER — Other Ambulatory Visit: Payer: Self-pay

## 2021-01-25 ENCOUNTER — Other Ambulatory Visit: Payer: Self-pay | Admitting: Psychiatry

## 2021-01-25 ENCOUNTER — Other Ambulatory Visit: Payer: Self-pay

## 2021-01-25 DIAGNOSIS — F330 Major depressive disorder, recurrent, mild: Secondary | ICD-10-CM

## 2021-01-25 MED FILL — Eletriptan Hydrobromide Tab 40 MG (Base Equivalent): ORAL | 25 days supply | Qty: 10 | Fill #0 | Status: AC

## 2021-01-28 ENCOUNTER — Other Ambulatory Visit: Payer: Self-pay

## 2021-01-28 MED FILL — Escitalopram Oxalate Tab 20 MG (Base Equiv): ORAL | 90 days supply | Qty: 90 | Fill #0 | Status: AC

## 2021-01-28 MED FILL — Bupropion HCl Tab ER 12HR 150 MG: ORAL | 30 days supply | Qty: 60 | Fill #0 | Status: AC

## 2021-01-29 ENCOUNTER — Other Ambulatory Visit: Payer: Self-pay

## 2021-01-29 ENCOUNTER — Telehealth (INDEPENDENT_AMBULATORY_CARE_PROVIDER_SITE_OTHER): Payer: 59 | Admitting: Psychiatry

## 2021-01-29 ENCOUNTER — Encounter: Payer: Self-pay | Admitting: Psychiatry

## 2021-01-29 DIAGNOSIS — F410 Panic disorder [episodic paroxysmal anxiety] without agoraphobia: Secondary | ICD-10-CM | POA: Diagnosis not present

## 2021-01-29 DIAGNOSIS — F3342 Major depressive disorder, recurrent, in full remission: Secondary | ICD-10-CM | POA: Diagnosis not present

## 2021-01-29 DIAGNOSIS — F5105 Insomnia due to other mental disorder: Secondary | ICD-10-CM

## 2021-01-29 NOTE — Progress Notes (Signed)
Virtual Visit via Video Note  I connected with Candice [name] on 01/29/21 at  4:30 PM EDT by a video enabled telemedicine application and verified that I am speaking with the correct person using two identifiers.  Location Provider Location : Office Patient Location : Car  Participants: Patient , Provider    I discussed the limitations of evaluation and management by telemedicine and the availability of in person appointments. The patient expressed understanding and agreed to proceed.     I discussed the assessment and treatment plan with the patient. The patient was provided an opportunity to ask questions and all were answered. The patient agreed with the plan and demonstrated an understanding of the instructions.   The patient was advised to call back or seek an in-person evaluation if the symptoms worsen or if the condition fails to improve as anticipated.   BH MD OP Progress Note  01/29/2021 4:56 PM EZMA REHM  MRN:  [id number]  Chief Complaint:  Chief Complaint    Follow-up; Depression; Anxiety     HPI: Candice [name] is a 35 year old Caucasian female, employed, lives in [place], has a history of MDD, panic attack, insomnia, bilateral hearing loss, migraine headache was evaluated by telemedicine today.  Patient today reports she continues to have anxiety.  She reports she is often anxious about different things however overall she has been coping okay.  She has not had any significant anxiety attacks recently.  She however does have Xanax available if she has breakthrough anxiety.  She rarely uses it.  Patient reports she continues to have psychosocial stressors of relationship struggles, work-related problems.  She reports she has established care with Ms. Christina Hussami and is interested in continuing therapy.  She reports she is working on her relationship with her husband and is currently taking it one day at a time.  She looks forward to a concert that  she is going to and also a vacation that she is going to take with her whole family to the beach soon.  Patient continues to have sexual side effects and has not noticed any difference from the addition of Wellbutrin.  She reports she wants to give it more time and agrees to talk to her therapist also.  Patient denies any suicidality, homicidality or perceptual disturbances.  Patient denies any other concerns today.  Visit Diagnosis:    ICD-10-CM   1. MDD (major depressive disorder), recurrent, in full remission (HCC)  F33.42   2. Panic attacks  F41.0   3. Insomnia due to mental condition  F51.05     Past Psychiatric History: I have reviewed past psychiatric history from progress note on 01/10/2019  Past Medical History:  Past Medical History:  Diagnosis Date  . Anxiety   . Depression   . Hearing loss   . Migraines    a. x 10+ years.  . Postpartum care following cesarean delivery (5/29) 02/04/2014  . Pre-syncope    a. 07/2015 [facility] ED visit, ? vasovagal, improved with IVF.  [name] Short cervix in second trimester, antepartum 11/16/2013    Past Surgical History:  Procedure Laterality Date  . CESAREAN SECTION N/A 02/03/2014   Procedure: Primary Cesarean Section Delivery Baby Girl @ 2244, Apgars 9/9;  Surgeon: Serita Kyle, MD;  Location: [facility];  Service: Obstetrics;  Laterality: N/A;  . CESAREAN SECTION    . WISDOM TOOTH EXTRACTION  2002    Family Psychiatric History: I have reviewed family psychiatric history from progress  note on 01/10/2019  Family History:  Family History  Problem Relation Age of Onset  . Depression Mother   . Anxiety disorder Mother   . Heart attack Mother        NSTEMI @ age 59.  [name] Alcohol abuse Paternal Grandfather   . Suicidality Paternal Grandfather   . Sleep apnea Father        alive @ 47.  [name] Heart attack Maternal Grandfather   . Anxiety disorder Maternal Grandmother   . Depression Maternal Grandmother   . Diabetes Maternal Grandmother   .  Hypertension Maternal Grandmother     Social History: I have reviewed social history from my progress note on 01/10/2019 Social History   Socioeconomic History  . Marital status: Married    Spouse name: Not on file  . Number of children: 1  . Years of education: Not on file  . Highest education level: Not on file  Occupational History  . Not on file  Tobacco Use  . Smoking status: Never Smoker  . Smokeless tobacco: Never Used  Vaping Use  . Vaping Use: Never used  Substance and Sexual Activity  . Alcohol use: Yes    Alcohol/week: 0.0 standard drinks    Comment: maybe two drinks/month.  . Drug use: No  . Sexual activity: Yes    Birth control/protection: Pill  Other Topics Concern  . Not on file  Social History Narrative   Lives in [place] with her husband and 80 month old dtr.  Does not routinely exercise.       Social Determinants of Health   Financial Resource Strain: Not on file  Food Insecurity: Not on file  Transportation Needs: Not on file  Physical Activity: Not on file  Stress: Not on file  Social Connections: Not on file    Allergies:  Allergies  Allergen Reactions  . Elemental Sulfur Hives  . Sulfa Antibiotics Other (See Comments) and Hives    Metabolic Disorder Labs: No results found for: HGBA1C, MPG No results found for: PROLACTIN No results found for: CHOL, TRIG, HDL, CHOLHDL, VLDL, LDLCALC Lab Results  Component Value Date   TSH 0.696 07/12/2015    Therapeutic Level Labs: No results found for: LITHIUM No results found for: VALPROATE No components found for:  CBMZ  Current Medications: Current Outpatient Medications  Medication Sig Dispense Refill  . ALPRAZolam (XANAX) 0.25 MG tablet TAKE 1 TABLET (0.25 MG TOTAL) BY MOUTH DAILY AS NEEDED FOR ANXIETY. ONLY FOR SEVERE ANXIETY ATTACKS 12 tablet 1  . benzonatate (TESSALON) 100 MG capsule Take 1 capsule (100 mg total) by mouth 3 (three) times daily as needed for cough. 30 capsule 0  .  buPROPion (WELLBUTRIN SR) 150 MG 12 hr tablet TAKE 1 TABLET BY MOUTH TWO TIMES DAILY 60 tablet 1  . ciprofloxacin (CILOXAN) 0.3 % ophthalmic solution Place 1 drop, every 2 hours, while awake, for 2 days. Then 1 drop, every 4 hours, while awake, for the next 5 days. 5 mL 0  . cyclobenzaprine (FLEXERIL) 10 MG tablet TAKE 1 TABLET BY MOUTH EVERY 8 HOURS AS NEEDED FOR MUSCLE SPASMS. 30 tablet 1  . cyclobenzaprine (FLEXERIL) 10 MG tablet TAKE 1 TABLET (10 MG TOTAL) BY MOUTH EVERY 8 (EIGHT) HOURS AS NEEDED FOR MUSCLE SPASMS. 40 tablet 2  . eletriptan (RELPAX) 40 MG tablet TAKE 1 TABLET BY MOUTH AS NEEDED FOR MIGRAINE OR HEADACHE. MAY REPEAT IN 2 HOURS IF HEADACHE PERSISTS OR RECURS. 10 tablet 11  . escitalopram (LEXAPRO)  20 MG tablet TAKE 1 TABLET BY MOUTH DAILY. 90 tablet 0  . fluticasone (FLONASE) 50 MCG/ACT nasal spray Place 2 sprays into both nostrils daily. 16 g 0  . HYDROcodone-acetaminophen (NORCO) 10-325 MG tablet Take 1 tablet by mouth every 6 (six) hours as needed. 10 tablet 0  . ibuprofen (ADVIL) 800 MG tablet TAKE 1 TABLET (800 MG TOTAL) BY MOUTH EVERY 8 (EIGHT) HOURS AS NEEDED. 60 tablet 1  . levonorgestrel-ethinyl estradiol (KURVELO) 0.15-30 MG-MCG tablet Take 1 tablet by mouth daily. Active pills only 3 Package 5  . levonorgestrel-ethinyl estradiol (NORDETTE) 0.15-30 MG-MCG tablet TAKE 1 TABLET BY MOUTH ONCE DAILY *TAKE ACTIVE PILLS ONLY* 112 tablet 1  . levonorgestrel-ethinyl estradiol (NORDETTE) 0.15-30 MG-MCG tablet TAKE 1 TABLET BY MOUTH ONCE DAILY (TAKE ACTIVE PILLS ONLY) 28 tablet 6  . meclizine (ANTIVERT) 25 MG tablet TAKE 1 TABLET BY MOUTH TWO TIMES DAILY AS NEEDED FOR DIZZINESS FOR UP TO 10 DAYS 20 tablet 0  . NURTEC 75 MG TBDP TAKE 1 TABLET BY MOUTH ONCE DAILY AS NEEDED. 8 tablet 2  . ondansetron (ZOFRAN) 8 MG tablet TAKE 1 TABLET BY MOUTH EVERY 8 HOURS AS NEEDED. 20 tablet 0  . ondansetron (ZOFRAN) 8 MG tablet TAKE 1 TABLET BY MOUTH EVERY 8 HOURS AS NEEDED FOR NAUSEA FOR UP TO 7  DAYS 20 tablet 0  . ondansetron (ZOFRAN-ODT) 4 MG disintegrating tablet DISSOLVE 1 TABLET BY MOUTH EVERY 8 (EIGHT) HOURS AS NEEDED FOR NAUSEA OR VOMITING. 20 tablet 6  . promethazine (PHENERGAN) 25 MG suppository Place 1 suppository (25 mg total) rectally every 6 (six) hours as needed for nausea or vomiting. (Patient not taking: No sig reported) 12 each 0  . promethazine (PHENERGAN) 25 MG tablet Take 1 tablet (25 mg total) by mouth every 8 (eight) hours as needed for nausea or vomiting. 30 tablet 0  . promethazine (PHENERGAN) 25 MG tablet TAKE 1 TABLET BY MOUTH EVERY 8 (EIGHT) HOURS AS NEEDED FOR NAUSEA 30 tablet 0  . SUMAtriptan (IMITREX) 100 MG tablet Take 1 tablet (100 mg total) by mouth as needed. (Patient not taking: Reported on 08/24/2020) 12 tablet 11  . SUMAtriptan 6 MG/0.5ML SOAJ Inject 1 cartridge into the skin as needed (migraine). May repeat in 2 hours.  No more than 2 doses of Imitrex per day. (Patient not taking: Reported on 08/24/2020) 12 Cartridge 3  . Ubrogepant 100 MG TABS TAKE 1 TABLET BY MOUTH AS NEEDED. 10 tablet 11  . valACYclovir (VALTREX) 1000 MG tablet TAKE 2 TABLETS (2,000 MG TOTAL) BY MOUTH 2 (TWO) TIMES DAILY TAKE FOR 2 DAYS AND MONITOR 10 tablet 0  . valACYclovir (VALTREX) 1000 MG tablet TAKE 2 TABLETS (2,000 MG TOTAL) BY MOUTH 2 (TWO) TIMES DAILY *TAKE FOR 2 DAYS AND MONITOR* 10 tablet 0   No current facility-administered medications for this visit.     Musculoskeletal: Strength & Muscle Tone: UTA Gait & Station: UTA Patient leans: N/A  Psychiatric Specialty Exam: Review of Systems  Psychiatric/Behavioral: The patient is nervous/anxious.   All other systems reviewed and are negative.   There were no vitals taken for this visit.There is no height or weight on file to calculate BMI.  General Appearance: Casual  Eye Contact:  Fair  Speech:  Normal Rate  Volume:  Normal  Mood:  Anxious  Affect:  Appropriate  Thought Process:  Goal Directed and Descriptions of  Associations: Intact  Orientation:  Full (Time, Place, and Person)  Thought Content: Logical   Suicidal  Thoughts:  No  Homicidal Thoughts:  No  Memory:  Immediate;   Fair Recent;   Fair Remote;   Fair  Judgement:  Fair  Insight:  Fair  Psychomotor Activity:  Normal  Concentration:  Concentration: Fair and Attention Span: Fair  Recall:  FiservFair  Fund of Knowledge: Fair  Language: Fair  Akathisia:  No  Handed:  Right  AIMS (if indicated): UTA  Assets:  Communication Skills Desire for Improvement Housing Social Support  ADL's:  Intact  Cognition: WNL  Sleep:  Fair   Screenings: PHQ2-9   Flowsheet Row [facility] Video [facility]14/2020 [facility]  PHQ-2 Total Score 1 2  PHQ-9 Total Score -- 8    Flowsheet Row Counselor from 11/28/2020 [facility] Video Visit [facility] 10/30/2020 in [facility]  C-SSRS RISK CATEGORY No Risk No Risk       Assessment and Plan: Candice FitchStephanie T [name] is a 35 year old Caucasian female, employed, married, has a history of depression, anxiety was evaluated by telemedicine today.  Patient is currently making progress however does have anxiety, relationship struggles and will continue to benefit from psychotherapy sessions and medication management.  Plan MDD in remission Lexapro 20 mg p.o. daily Wellbutrin SR 150 mg p.o. twice daily  Panic attacks-stable Lexapro was prescribed Patient to continue CBT with Ms. Christina Hussami Xanax 0.25 mg p.o. daily as needed for severe panic attacks  Insomnia-stable Continue sleep hygiene techniques  Follow-up in clinic in 4 to 5 months in office or sooner as needed.  This note was generated in part or whole with voice recognition software. Voice recognition is usually quite accurate but there are transcription errors that can and very often do occur. I apologize  for any typographical errors that were not detected and corrected.        Jomarie LongsSaramma Eveline Sauve, MD 01/30/2021, 3:27 PM

## 2021-01-31 ENCOUNTER — Other Ambulatory Visit: Payer: Self-pay

## 2021-02-01 ENCOUNTER — Other Ambulatory Visit: Payer: Self-pay

## 2021-02-01 ENCOUNTER — Other Ambulatory Visit: Payer: Self-pay | Admitting: *Deleted

## 2021-02-01 MED ORDER — LEVONORGESTREL-ETHINYL ESTRAD 0.15-30 MG-MCG PO TABS
ORAL_TABLET | ORAL | 6 refills | Status: DC
  Filled 2021-02-01: qty 112, 84d supply, fill #0
  Filled 2021-04-24: qty 84, 63d supply, fill #1

## 2021-02-05 ENCOUNTER — Other Ambulatory Visit: Payer: Self-pay

## 2021-02-05 ENCOUNTER — Ambulatory Visit (INDEPENDENT_AMBULATORY_CARE_PROVIDER_SITE_OTHER): Payer: 59 | Admitting: Licensed Clinical Social Worker

## 2021-02-05 DIAGNOSIS — F3342 Major depressive disorder, recurrent, in full remission: Secondary | ICD-10-CM | POA: Diagnosis not present

## 2021-02-05 DIAGNOSIS — F410 Panic disorder [episodic paroxysmal anxiety] without agoraphobia: Secondary | ICD-10-CM | POA: Diagnosis not present

## 2021-02-05 NOTE — Progress Notes (Signed)
Virtual Visit via Video Note  I connected with [name] on 02/05/21 at  4:00 PM EDT by a video enabled telemedicine application and verified that I am speaking with the correct person using two identifiers.  Video connection was lost when less than 50% of the duration of the visit was complete, at which time the remainder of the visit was completed via audio only.  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 The patient expressed understanding and agreed to proceed.  I discussed the assessment and treatment plan with the patient. The patient was provided an opportunity to ask questions and all were answered. The patient agreed with the plan and demonstrated an understanding of the instructions.   The patient was advised to call back or seek an in-person evaluation if the symptoms worsen or if the condition fails to improve as anticipated.  I provided 40 minutes of non-face-to-face time during this encounter.   Peggye Poon R Kersti Scavone, LCSW    THERAPIST PROGRESS NOTE  Session Time: 4-4:40p  Participation Level: Active  Behavioral Response: NAAlertAnxious  Type of Therapy: Individual Therapy  Treatment Goals addressed: Anxiety  Interventions: Solution Focused and Strength-based  Summary: LYNSEE WANDS is a 35 y.o. female who presents with symptoms associated with anxiety disorder. Pt reports that overall mood is stable and that she is is getting good quality and quantity of sleep.   Allowed pt to explore and express thoughts and feelings associated with recent life situations and external stressors. Discussion surrounded around the need for couples therapy--discussed recent events/situations involving spouse and discussed different paths of getting treatment.  Dr. Elna Breslow suggested joint/couples therapy and LCSW clinician is not  opposed to it.  Discussed with pt that I would open up the suggestion to OPT department at [facility] and if no response, I will do some joint sessions as part of Luise's tx plan.   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 Encouraged pt to find some time for mindfulness each day of trip to allow herself to reset.  Continued recommendations are as follows: self care behaviors, positive social engagements, focusing on overall work/home/life balance, and focusing on positive physical and emotional wellness.     Suicidal/Homicidal: No  Therapist Response: Danniell is continuing to identify major life conflicts from past and present that form the basis for present anxiety. Kellene is able to identify anxiety coping mechanisms that have been successful in the past and increase their use. Italia is engaging in physical and recreational activities that help maintain mood regulation. These behaviors are reflective of both personal growth and progress. Treatment to continue as indicated.  Plan: Return again in 4 weeks.  Diagnosis: Axis I: MDD, recurrent, in remission; Panic Attacks    Axis II: No diagnosis    Ernest Haber Cola [place]fill, LCSW 02/05/2021

## 2021-02-11 ENCOUNTER — Encounter: Payer: Self-pay | Admitting: *Deleted

## 2021-02-25 ENCOUNTER — Other Ambulatory Visit: Payer: Self-pay

## 2021-03-01 ENCOUNTER — Encounter: Payer: Self-pay | Admitting: Physician Assistant

## 2021-03-01 ENCOUNTER — Ambulatory Visit: Payer: 59 | Admitting: Physician Assistant

## 2021-03-01 ENCOUNTER — Other Ambulatory Visit: Payer: Self-pay

## 2021-03-01 VITALS — BP 143/86 | HR 94 | Wt 148.0 lb

## 2021-03-01 DIAGNOSIS — G43009 Migraine without aura, not intractable, without status migrainosus: Secondary | ICD-10-CM

## 2021-03-01 MED ORDER — ONDANSETRON 8 MG PO TBDP
8.0000 mg | ORAL_TABLET | Freq: Three times a day (TID) | ORAL | 0 refills | Status: DC | PRN
  Filled 2021-03-01: qty 20, 7d supply, fill #0

## 2021-03-01 MED ORDER — HYDROCODONE-ACETAMINOPHEN 10-325 MG PO TABS
1.0000 | ORAL_TABLET | Freq: Four times a day (QID) | ORAL | 0 refills | Status: DC | PRN
  Filled 2021-03-01: qty 5, 2d supply, fill #0

## 2021-03-01 MED ORDER — PROPRANOLOL HCL 60 MG PO TABS
60.0000 mg | ORAL_TABLET | Freq: Every day | ORAL | 3 refills | Status: DC
  Filled 2021-03-01: qty 90, 90d supply, fill #0

## 2021-03-01 MED ORDER — PROMETHAZINE HCL 25 MG PO TABS
ORAL_TABLET | ORAL | 0 refills | Status: DC
  Filled 2021-03-01: qty 30, 10d supply, fill #0

## 2021-03-01 MED ORDER — UBROGEPANT 100 MG PO TABS
1.0000 | ORAL_TABLET | ORAL | 11 refills | Status: DC | PRN
  Filled 2021-03-01: qty 10, 15d supply, fill #0
  Filled 2021-04-10: qty 10, 15d supply, fill #1

## 2021-03-01 MED ORDER — ELETRIPTAN HYDROBROMIDE 40 MG PO TABS
ORAL_TABLET | ORAL | 11 refills | Status: DC
  Filled 2021-03-01 – 2021-03-06 (×3): qty 12, 30d supply, fill #0
  Filled 2021-04-10: qty 10, 30d supply, fill #0

## 2021-03-01 NOTE — Progress Notes (Signed)
History:  Candice [name] is a 35 y.o. G1P1001 who presents to clinic today for migraine f/u.  She did not have her medications after they were packed up and lost.  She reports last norco rx was in that bag and never filled before it expired.  She has a sample of Reyvow that she recently located but has not used it.  She uses Relpax plus ibuprofen.  Relpax makes her feel tingly and heavy although she notes it is much more effective than the imitrex was.  Used 100mg  ubrelvy once and 50mg  once and it may have worked.  It was some time ago and she forgot. She has not been using propanolol for prevention.  She forgot. She notes her relationship and home life has been more stable.  HIT6:56 Number of days in the last 4 weeks with:  Severe headache: 0 Moderate headache: 3 Mild headache: 12  No headache: 13   Past Medical History:  Diagnosis Date   Anxiety    Depression    Hearing loss    Migraines    a. x 10+ years.   Postpartum care following cesarean delivery (5/29) 02/04/2014   Pre-syncope    a. 07/2015 [facility] ED visit, ? vasovagal, improved with IVF.   Short cervix in second trimester, antepartum 11/16/2013    Social History   Socioeconomic History   Marital status: Married    Spouse name: Not on file   Number of children: 1   Years of education: Not on file   Highest education level: Not on file  Occupational History   Not on file  Tobacco Use   Smoking status: Never   Smokeless tobacco: Never  Vaping Use   Vaping Use: Never used  Substance and Sexual Activity   Alcohol use: Yes    Alcohol/week: 0.0 standard drinks    Comment: maybe two drinks/month.   Drug use: No   Sexual activity: Yes    Birth control/protection: Pill  Other Topics Concern   Not on file  Social History Narrative   Lives in [place] with her husband and 78 month old dtr.  Does not routinely exercise.       Social Determinants of Health   Financial Resource Strain: Not on file  Food Insecurity:  Not on file  Transportation Needs: Not on file  Physical Activity: Not on file  Stress: Not on file  Social Connections: Not on file  Intimate Partner Violence: Not on file    Family History  Problem Relation Age of Onset   Depression Mother    Anxiety disorder Mother    Heart attack Mother        NSTEMI @ age 85.   Alcohol abuse Paternal Grandfather    Suicidality Paternal Grandfather    Sleep apnea Father        alive @ 40.   Heart attack Maternal Grandfather    Anxiety disorder Maternal Grandmother    Depression Maternal Grandmother    Diabetes Maternal Grandmother    Hypertension Maternal Grandmother     Allergies  Allergen Reactions   Elemental Sulfur Hives   Sulfa Antibiotics Other (See Comments) and Hives    Current Outpatient Medications on File Prior to Visit  Medication Sig Dispense Refill   benzonatate (TESSALON) 100 MG capsule Take 1 capsule (100 mg total) by mouth 3 (three) times daily as needed for cough. 30 capsule 0   buPROPion (WELLBUTRIN SR) 150 MG 12 hr tablet TAKE 1 TABLET BY  MOUTH TWO TIMES DAILY 60 tablet 1   ciprofloxacin (CILOXAN) 0.3 % ophthalmic solution Place 1 drop, every 2 hours, while awake, for 2 days. Then 1 drop, every 4 hours, while awake, for the next 5 days. 5 mL 0   cyclobenzaprine (FLEXERIL) 10 MG tablet TAKE 1 TABLET BY MOUTH EVERY 8 HOURS AS NEEDED FOR MUSCLE SPASMS. 30 tablet 1   cyclobenzaprine (FLEXERIL) 10 MG tablet TAKE 1 TABLET (10 MG TOTAL) BY MOUTH EVERY 8 (EIGHT) HOURS AS NEEDED FOR MUSCLE SPASMS. 40 tablet 2   eletriptan (RELPAX) 40 MG tablet TAKE 1 TABLET BY MOUTH AS NEEDED FOR MIGRAINE OR HEADACHE. MAY REPEAT IN 2 HOURS IF HEADACHE PERSISTS OR RECURS. 10 tablet 11   escitalopram (LEXAPRO) 20 MG tablet TAKE 1 TABLET BY MOUTH DAILY. 90 tablet 0   fluticasone (FLONASE) 50 MCG/ACT nasal spray Place 2 sprays into both nostrils daily. 16 g 0   HYDROcodone-acetaminophen (NORCO) 10-325 MG tablet Take 1 tablet by mouth every 6 (six)  hours as needed. 10 tablet 0   ibuprofen (ADVIL) 800 MG tablet TAKE 1 TABLET (800 MG TOTAL) BY MOUTH EVERY 8 (EIGHT) HOURS AS NEEDED. 60 tablet 1   levonorgestrel-ethinyl estradiol (KURVELO) 0.15-30 MG-MCG tablet Take 1 tablet by mouth daily. Active pills only 3 Package 5   levonorgestrel-ethinyl estradiol (NORDETTE) 0.15-30 MG-MCG tablet TAKE 1 TABLET BY MOUTH ONCE DAILY *TAKE ACTIVE PILLS ONLY* 112 tablet 1   levonorgestrel-ethinyl estradiol (NORDETTE) 0.15-30 MG-MCG tablet TAKE 1 TABLET BY MOUTH ONCE DAILY (TAKE ACTIVE PILLS ONLY) 28 tablet 6   meclizine (ANTIVERT) 25 MG tablet TAKE 1 TABLET BY MOUTH TWO TIMES DAILY AS NEEDED FOR DIZZINESS FOR UP TO 10 DAYS 20 tablet 0   NURTEC 75 MG TBDP TAKE 1 TABLET BY MOUTH ONCE DAILY AS NEEDED. 8 tablet 2   ondansetron (ZOFRAN) 8 MG tablet TAKE 1 TABLET BY MOUTH EVERY 8 HOURS AS NEEDED. 20 tablet 0   ondansetron (ZOFRAN) 8 MG tablet TAKE 1 TABLET BY MOUTH EVERY 8 HOURS AS NEEDED FOR NAUSEA FOR UP TO 7 DAYS 20 tablet 0   ondansetron (ZOFRAN-ODT) 4 MG disintegrating tablet DISSOLVE 1 TABLET BY MOUTH EVERY 8 (EIGHT) HOURS AS NEEDED FOR NAUSEA OR VOMITING. 20 tablet 6   promethazine (PHENERGAN) 25 MG suppository Place 1 suppository (25 mg total) rectally every 6 (six) hours as needed for nausea or vomiting. 12 each 0   promethazine (PHENERGAN) 25 MG tablet Take 1 tablet (25 mg total) by mouth every 8 (eight) hours as needed for nausea or vomiting. 30 tablet 0   promethazine (PHENERGAN) 25 MG tablet TAKE 1 TABLET BY MOUTH EVERY 8 (EIGHT) HOURS AS NEEDED FOR NAUSEA 30 tablet 0   SUMAtriptan (IMITREX) 100 MG tablet Take 1 tablet (100 mg total) by mouth as needed. 12 tablet 11   SUMAtriptan 6 MG/0.5ML SOAJ Inject 1 cartridge into the skin as needed (migraine). May repeat in 2 hours.  No more than 2 doses of Imitrex per day. (Patient taking differently: Inject 1 cartridge into the skin as needed (migraine). May repeat in 2 hours.  No more than 2 doses of Imitrex per  day.) 12 Cartridge 3   Ubrogepant 100 MG TABS TAKE 1 TABLET BY MOUTH AS NEEDED. 10 tablet 11   valACYclovir (VALTREX) 1000 MG tablet TAKE 2 TABLETS (2,000 MG TOTAL) BY MOUTH 2 (TWO) TIMES DAILY TAKE FOR 2 DAYS AND MONITOR 10 tablet 0   valACYclovir (VALTREX) 1000 MG tablet TAKE 2 TABLETS (2,000 MG  TOTAL) BY MOUTH 2 (TWO) TIMES DAILY *TAKE FOR 2 DAYS AND MONITOR* 10 tablet 0   No current facility-administered medications on file prior to visit.     Review of Systems:  All pertinent positive/negative included in HPI, all other review of systems are negative   Objective:  Physical Exam BP (!) 143/86   Pulse 94   Wt 148 lb (67.1 kg)   BMI 25.40 kg/m  CONSTITUTIONAL: Well-developed, well-nourished female in no acute distress.  EYES: EOM intact ENT: Normocephalic CARDIOVASCULAR: Regular rate RESPIRATORY: Normal rate.  MUSCULOSKELETAL: Normal ROM SKIN: Warm, dry without erythema  NEUROLOGICAL: Alert, oriented, CN II-XII grossly intact, Appropriate balance, PSYCH: Normal behavior, mood   Assessment & Plan:  Assessment: Migraines have improved overall but are still too frequent and with unreliable acute therapy  Plan: Restart propanolol for migraine prevention.  Get a pill box to make sure it is easy and quick to take meds and see if they were taken.   Bernita Raisin - first line for acute migraine.  If ineffective Relpax plus ibuprofen is next option.   Reyvow for migraine rescue - will take the place of norco if patient finds she can use it without issue.   Norco - last resort rescue Phenergan for nausea/acute migraine Continuous OCP for migraine prevention - if discontinuing and seeking pregnancy - will need to discontinue all of the above medications in favor of prn flexeril  Follow-up in 6 months or sooner PRN  Bertram Denver, PA-C 03/01/2021 8:58 AM

## 2021-03-04 ENCOUNTER — Other Ambulatory Visit: Payer: Self-pay

## 2021-03-06 ENCOUNTER — Other Ambulatory Visit: Payer: Self-pay

## 2021-03-07 ENCOUNTER — Other Ambulatory Visit: Payer: Self-pay

## 2021-03-07 ENCOUNTER — Ambulatory Visit: Payer: 59 | Admitting: Licensed Clinical Social Worker

## 2021-03-12 ENCOUNTER — Encounter: Payer: Self-pay | Admitting: *Deleted

## 2021-04-02 DIAGNOSIS — R42 Dizziness and giddiness: Secondary | ICD-10-CM | POA: Diagnosis not present

## 2021-04-02 DIAGNOSIS — H8112 Benign paroxysmal vertigo, left ear: Secondary | ICD-10-CM | POA: Diagnosis not present

## 2021-04-10 ENCOUNTER — Other Ambulatory Visit: Payer: Self-pay | Admitting: Physician Assistant

## 2021-04-10 ENCOUNTER — Other Ambulatory Visit: Payer: Self-pay

## 2021-04-10 DIAGNOSIS — R42 Dizziness and giddiness: Secondary | ICD-10-CM | POA: Diagnosis not present

## 2021-04-10 MED FILL — Cyclobenzaprine HCl Tab 10 MG: ORAL | 15 days supply | Qty: 40 | Fill #0 | Status: AC

## 2021-04-10 MED FILL — Bupropion HCl Tab ER 12HR 150 MG: ORAL | 30 days supply | Qty: 60 | Fill #1 | Status: AC

## 2021-04-11 ENCOUNTER — Other Ambulatory Visit: Payer: Self-pay

## 2021-04-12 ENCOUNTER — Other Ambulatory Visit: Payer: Self-pay

## 2021-04-12 MED ORDER — PROMETHAZINE HCL 25 MG PO TABS
ORAL_TABLET | ORAL | 0 refills | Status: DC
  Filled 2021-04-12: qty 30, 10d supply, fill #0

## 2021-04-12 MED FILL — Promethazine HCl Tab 25 MG: ORAL | 30 days supply | Qty: 30 | Fill #0 | Status: CN

## 2021-04-23 ENCOUNTER — Other Ambulatory Visit: Payer: Self-pay

## 2021-04-23 ENCOUNTER — Ambulatory Visit (INDEPENDENT_AMBULATORY_CARE_PROVIDER_SITE_OTHER): Payer: 59 | Admitting: Licensed Clinical Social Worker

## 2021-04-23 DIAGNOSIS — F330 Major depressive disorder, recurrent, mild: Secondary | ICD-10-CM | POA: Diagnosis not present

## 2021-04-23 NOTE — Progress Notes (Signed)
Virtual Visit via Video Note  I connected with Candice [name] on 04/23/21 at  4:00 PM EDT by a video enabled telemedicine application and verified that I am speaking with the correct person using two identifiers.  Location: Patient: home Provider: remote office [place], [place])   I discussed the limitations of evaluation and management by telemedicine and the availability of in person appointments. The patient expressed understanding and agreed to proceed.  I discussed the assessment and treatment plan with the patient. The patient was provided an opportunity to ask questions and all were answered. The patient agreed with the plan and demonstrated an understanding of the instructions.   The patient was advised to call back or seek an in-person evaluation if the symptoms worsen or if the condition fails to improve as anticipated.  I provided 60 minutes of non-face-to-face time during this encounter.   Ian Cavey R Nabila Albarracin, LCSW   THERAPIST PROGRESS NOTE  Session Time: 4-5:00p  Participation Level: Active  Behavioral Response: Neat and Well GroomedAlertDepressed  Type of Therapy: Individual Therapy  Treatment Goals addressed: Coping and Diagnosis: depression  Interventions: CBT, Solution Focused, Supportive, and Other: crisis response/safety planning  Summary: Candice [name] is a 35 y.o. female who presents with continuing symptoms related to depression diagnosis. Pt reports that she has had an escalation of symptoms triggered by a recent vertigo/dizziness episode. Pt reports that during this episode due to nausea/vomiting she did not take her medication. Pt states before she was aware of it she missed around a week of Wellbutrin. Pt states that she started to get suicidal ideation with vague plans--pt was thinking about time of day to follow through and how to plan it so that certain people would find her. Pt denies any current suicidal ideation or danger to self at time of  session. Reviewed local crisis resources and developed safety plan w/ pt.   Suicidal/Homicidal: Yes. Thoughts one week ago with vague plans. Pt denies current SI and made the decision to go back on her medication. Reviewed local crisis resources.  Therapist Response: Pt experiencing escalating symptoms which is reflective of intermittent/fluctuating progress  Plan: Return again in 4 weeks.  Diagnosis: Axis I: MDD, recurrent, mild    Axis II: No diagnosis    Ernest Haber Czarina Gingras, LCSW 04/23/2021

## 2021-04-24 ENCOUNTER — Other Ambulatory Visit: Payer: Self-pay

## 2021-04-25 ENCOUNTER — Ambulatory Visit: Payer: 59 | Admitting: Neurology

## 2021-04-25 ENCOUNTER — Other Ambulatory Visit: Payer: Self-pay

## 2021-04-25 MED ORDER — ONDANSETRON 8 MG PO TBDP
8.0000 mg | ORAL_TABLET | Freq: Three times a day (TID) | ORAL | 0 refills | Status: DC | PRN
  Filled 2021-04-25: qty 20, 7d supply, fill #0

## 2021-05-01 ENCOUNTER — Ambulatory Visit: Payer: 59 | Admitting: Neurology

## 2021-05-01 ENCOUNTER — Encounter: Payer: Self-pay | Admitting: Neurology

## 2021-05-01 ENCOUNTER — Other Ambulatory Visit: Payer: Self-pay

## 2021-05-01 VITALS — BP 128/86 | HR 95 | Ht 64.0 in | Wt 152.0 lb

## 2021-05-01 DIAGNOSIS — G43709 Chronic migraine without aura, not intractable, without status migrainosus: Secondary | ICD-10-CM

## 2021-05-01 MED ORDER — PROPRANOLOL HCL ER 60 MG PO CP24
60.0000 mg | ORAL_CAPSULE | Freq: Every day | ORAL | 6 refills | Status: DC
  Filled 2021-05-01: qty 30, 30d supply, fill #0
  Filled 2021-09-13: qty 30, 30d supply, fill #1
  Filled 2022-01-07: qty 30, 30d supply, fill #2
  Filled 2022-01-30: qty 30, 30d supply, fill #3
  Filled 2022-03-11: qty 30, 30d supply, fill #4

## 2021-05-01 MED ORDER — UBROGEPANT 100 MG PO TABS
1.0000 | ORAL_TABLET | ORAL | 11 refills | Status: AC | PRN
  Filled 2021-05-01: qty 16, 24d supply, fill #0
  Filled 2021-08-12: qty 16, 24d supply, fill #1
  Filled 2021-10-15: qty 16, 24d supply, fill #2
  Filled 2021-11-18: qty 16, 24d supply, fill #3
  Filled 2022-01-07: qty 16, 24d supply, fill #4
  Filled 2022-01-30: qty 16, 24d supply, fill #5
  Filled 2022-03-11 – 2022-03-13 (×2): qty 16, 30d supply, fill #6

## 2021-05-01 NOTE — Progress Notes (Addendum)
[facility]    Provider:  Dr Lucia Gaskins Requesting Provider: Linus Salmons, MD Primary Care Provider:  Enid Baas, MD  CC:  Migraines  HPI:  Candice [name] is a 35 y.o. female here as requested by Linus Salmons, MD for migraines. She is a friend of Candice [name]. Started between 15-20 years ago. Mother has migraines. She is on continuous birth control and that really helps. Candice [name] is helping. Relpax used to help but not covered anymore. She is also having vertigo episodes. She has hearing loss. Her vertigo can be severe. ENT BPPV and +dix hallpike and did the epley. Usually the migraines start at the right temple and can then spread to the right eye, there is intense pressure, throbbing/pulsating/pounding,photo/phono/osmophobia, smells can trigger, nausea, vomiting, slight dizziness. Vertigo is resolved. She has 15 moderately to severe migraines days a week for > 1 year can last 12-24 hours each. She is aware of medication rebound and is careful, no medication overuse. No aura. Severe migraines 6 days a month, and at least 9 of more moderately. She has other headaches most of the month. An ice pak helps. A dark room helps. Emgality did not help. Candice [name] does work. +nausea. + movement makes it worse.  No other focal neurologic deficits, associated symptoms, inciting events or modifiable factors.  Candice Ingles PA: [facility] [place].   Reviewed notes, labs and imaging from outside physicians, which showed:  From a thorough review of records, medications tried that can be used in migraine management include: Tylenol, Fioricet, Flexeril, Benadryl, eletriptan, Lexapro, ibuprofen, sumatriptan, ketorolac, meclizine, Methergine, Reglan, metoprolol, nifedipine (calcium channel blocker similar to verapamil), Nurtec, Zofran, Phenergan, propranolol, Nurtec, scopolamine patch, Imitrex, sumatriptan injection, Topamax, Ubrelvy, venlafaxine, Zomig, lidocaine,  maxalt, amitriptyline/nortriptyline, propranolol, maxalt  June 13, 2020: Vitamin B12 is 303, CMP normal, CBC normal, TSH normal  MRI of the brain October 2019 and October 2021 with and without contrast with thin cuts through the internal auditory canals both normal.  Personally reviewed images.   Review of Systems: Patient complains of symptoms per HPI as well as the following symptoms headaches. Pertinent negatives and positives per HPI. All others negative.   Social History   Socioeconomic History   Marital status: Married    Spouse name: Not on file   Number of children: 1   Years of education: Not on file   Highest education level: Not on file  Occupational History   Not on file  Tobacco Use   Smoking status: Never   Smokeless tobacco: Never  Vaping Use   Vaping Use: Never used  Substance and Sexual Activity   Alcohol use: Yes    Alcohol/week: 0.0 standard drinks    Comment: maybe two drinks/month.   Drug use: No   Sexual activity: Yes    Birth control/protection: Pill  Other Topics Concern   Not on file  Social History Narrative   Lives in [place] with her husband and 73 month old dtr.  Does not routinely exercise.       Social Determinants of Health   Financial Resource Strain: Not on file  Food Insecurity: Not on file  Transportation Needs: Not on file  Physical Activity: Not on file  Stress: Not on file  Social Connections: Not on file  Intimate Partner Violence: Not on file    Family History  Problem Relation Age of Onset   Depression Mother    Anxiety disorder Mother    Heart attack Mother  NSTEMI @ age 63.   Migraines Mother    Sleep apnea Father        alive @ 31.   Anxiety disorder Maternal Grandmother    Depression Maternal Grandmother    Diabetes Maternal Grandmother    Hypertension Maternal Grandmother    Heart attack Maternal Grandfather    Alcohol abuse Paternal Grandfather    Suicidality Paternal Grandfather     Past  Medical History:  Diagnosis Date   Anxiety    Depression    Hearing loss    Migraines    a. x 10+ years.   Postpartum care following cesarean delivery (5/29) 02/04/2014   Pre-syncope    a. 07/2015 [facility] ED visit, ? vasovagal, improved with IVF.   Short cervix in second trimester, antepartum 11/16/2013    Patient Active Problem List   Diagnosis Date Noted   MDD (major depressive disorder), recurrent episode, mild (HCC) 10/30/2020   Insomnia due to mental condition 07/14/2019   Anxiety 05/13/2019   MDD (major depressive disorder), recurrent, in full remission (HCC) 04/08/2019   Panic attacks 04/08/2019   MDD (major depressive disorder), recurrent, in partial remission (HCC) 04/08/2019   Stress at home 12/19/2016   Muscle spasm 06/06/2016   Migraine without aura and with status migrainosus, not intractable 11/23/2015   Migraine without aura and without status migrainosus, not intractable 09/11/2015   Chronic migraine without aura without status migrainosus, not intractable 09/11/2015   Sinus tachycardia 08/27/2015   Depression, major, recurrent, moderate (HCC) 02/19/2015   Postpartum care following cesarean delivery (5/29) 02/04/2014   Indication for care in labor or delivery 02/03/2014   Short cervix in second trimester, antepartum 11/16/2013   Antepartum bleeding, second trimester 11/15/2013    Past Surgical History:  Procedure Laterality Date   CESAREAN SECTION N/A 02/03/2014   Procedure: Primary Cesarean Section Delivery Baby Girl @ 2244, Apgars 9/9;  Surgeon: Serita Kyle, MD;  Location: [facility];  Service: Obstetrics;  Laterality: N/A;   CESAREAN SECTION     WISDOM TOOTH EXTRACTION  2002    Current Outpatient Medications  Medication Sig Dispense Refill   buPROPion (WELLBUTRIN SR) 150 MG 12 hr tablet TAKE 1 TABLET BY MOUTH TWO TIMES DAILY 60 tablet 1   cyclobenzaprine (FLEXERIL) 10 MG tablet TAKE 1 TABLET (10 MG TOTAL) BY MOUTH EVERY 8 (EIGHT) HOURS AS NEEDED FOR  MUSCLE SPASMS. 40 tablet 2   eletriptan (RELPAX) 40 MG tablet TAKE 1 TABLET BY MOUTH AS NEEDED FOR MIGRAINE OR HEADACHE. MAY REPEAT IN 2 HOURS IF HEADACHE PERSISTS OR RECURS. 10 tablet 11   escitalopram (LEXAPRO) 20 MG tablet TAKE 1 TABLET BY MOUTH DAILY. 90 tablet 0   fluticasone (FLONASE) 50 MCG/ACT nasal spray Place 2 sprays into both nostrils daily. 16 g 0   HYDROcodone-acetaminophen (NORCO) 10-325 MG tablet Take 1 tablet by mouth every 6 (six) hours as needed. 5 tablet 0   ibuprofen (ADVIL) 800 MG tablet TAKE 1 TABLET (800 MG TOTAL) BY MOUTH EVERY 8 (EIGHT) HOURS AS NEEDED. 60 tablet 1   levonorgestrel-ethinyl estradiol (NORDETTE) 0.15-30 MG-MCG tablet TAKE 1 TABLET BY MOUTH ONCE DAILY (TAKE ACTIVE PILLS ONLY) 28 tablet 6   meclizine (ANTIVERT) 25 MG tablet TAKE 1 TABLET BY MOUTH TWO TIMES DAILY AS NEEDED FOR DIZZINESS FOR UP TO 10 DAYS 20 tablet 0   NURTEC 75 MG TBDP TAKE 1 TABLET BY MOUTH ONCE DAILY AS NEEDED. 8 tablet 2   ondansetron (ZOFRAN-ODT) 8 MG disintegrating tablet Take  1 tablet (8 mg total) by mouth every 8 (eight) hours as needed for nausea or vomiting. 20 tablet 0   promethazine (PHENERGAN) 25 MG tablet TAKE 1 TABLET BY MOUTH EVERY 8 (EIGHT) HOURS AS NEEDED FOR NAUSEA 30 tablet 0   promethazine (PHENERGAN) 25 MG tablet TAKE 1 TABLET BY MOUTH EVERY 8 (EIGHT) HOURS AS NEEDED FOR NAUSEA 30 tablet 0   propranolol ER (INDERAL LA) 60 MG 24 hr capsule Take 1 capsule (60 mg total) by mouth at bedtime. 30 capsule 6   valACYclovir (VALTREX) 1000 MG tablet TAKE 2 TABLETS (2,000 MG TOTAL) BY MOUTH 2 (TWO) TIMES DAILY TAKE FOR 2 DAYS AND MONITOR 10 tablet 0   Ubrogepant 100 MG TABS TAKE 1 TABLET BY MOUTH AS NEEDED. 16 tablet 11   valACYclovir (VALTREX) 1000 MG tablet Take 2 tablets (2,000 mg total) by mouth 2 (two) times daily. Take for 2 days and monitor 10 tablet 0   No current facility-administered medications for this visit.    Allergies as of 05/01/2021 - Review Complete 05/01/2021   Allergen Reaction Noted   Elemental sulfur Hives 06/24/2013   Sulfa antibiotics Other (See Comments) and Hives 02/19/2015    Vitals: BP 128/86   Pulse 95   Ht 5\' 4"  (1.626 m)   Wt 152 lb (68.9 kg)   BMI 26.09 kg/m  Last Weight:  Wt Readings from Last 1 Encounters:  05/01/21 152 lb (68.9 kg)   Last Height:   Ht Readings from Last 1 Encounters:  05/01/21 5\' 4"  (1.626 m)     Physical exam: Exam: Gen: NAD, conversant, well nourised, well groomed                     CV: RRR, no MRG. No Carotid Bruits. No peripheral edema, warm, nontender Eyes: Conjunctivae clear without exudates or hemorrhage  Neuro: Detailed Neurologic Exam  Speech:    Speech is normal; fluent and spontaneous with normal comprehension.  Cognition:    The patient is oriented to person, place, and time;     recent and remote memory intact;     language fluent;     normal attention, concentration,     fund of knowledge Cranial Nerves:    The pupils are equal, round, and reactive to light. The fundi are flat. Visual fields are full to finger confrontation. Extraocular movements are intact. Trigeminal sensation is intact and the muscles of mastication are normal. The face is symmetric. The palate elevates in the midline. Hearing intact. Voice is normal. Shoulder shrug is normal. The tongue has normal motion without fasciculations.   Coordination:    Normal finger to nose   Gait:     normal.   Motor Observation:    No asymmetry, no atrophy, and no involuntary movements noted. Tone:    Normal muscle tone.    Posture:    Posture is normal. normal erect    Strength:    Strength is V/V in the upper and lower limbs.      Sensation: intact to LT     Reflex Exam:  DTR's:    Deep tendon reflexes in the upper and lower extremities are normal bilaterally.   Toes:    The toes are downgoing bilaterally.   Clonus:    Clonus is absent.    Assessment/Plan:  Patient with chronic migraines has failed  multiple medications. Had a long discussion about options. Initiate Botox for migraines. Discussed qulipta** if botox does not succeed.  Discussed: To prevent or relieve headaches, try the following: Cool Compress. Lie down and place a cool compress on your head.  Avoid headache triggers. If certain foods or odors seem to have triggered your migraines in the past, avoid them. A headache diary might help you identify triggers.  Include physical activity in your daily routine. Try a daily walk or other moderate aerobic exercise.  Manage stress. Find healthy ways to cope with the stressors, such as delegating tasks on your to-do list.  Practice relaxation techniques. Try deep breathing, yoga, massage and visualization.  Eat regularly. Eating regularly scheduled meals and maintaining a healthy diet might help prevent headaches. Also, drink plenty of fluids.  Follow a regular sleep schedule. Sleep deprivation might contribute to headaches Consider biofeedback. With this mind-body technique, you learn to control certain bodily functions -- such as muscle tension, heart rate and blood pressure -- to prevent headaches or reduce headache pain.    Proceed to emergency room if you experience new or worsening symptoms or symptoms do not resolve, if you have new neurologic symptoms or if headache is severe, or for any concerning symptom.   Provided education and documentation from American headache Society toolbox including articles on: chronic migraine medication overuse headache, chronic migraines, prevention of migraines, behavioral and other nonpharmacologic treatments for headache.   No orders of the defined types were placed in this encounter.  Meds ordered this encounter  Medications   propranolol ER (INDERAL LA) 60 MG 24 hr capsule    Sig: Take 1 capsule (60 mg total) by mouth at bedtime.    Dispense:  30 capsule    Refill:  6   Ubrogepant 100 MG TABS    Sig: TAKE 1 TABLET BY MOUTH AS NEEDED.     Dispense:  16 tablet    Refill:  11     Cc: Linus Salmons, MD,  Enid Baas, MD  Naomie Dean, MD  [facility] [address] [place], [place] [address]  Phone [phone] Fax [phone]  I spent over 60  minutes of face-to-face and non-face-to-face time with patient on the  1. Chronic migraine without aura without status migrainosus, not intractable    diagnosis.  This included previsit chart review, lab review, study review, order entry, electronic health record documentation, patient education on the different diagnostic and therapeutic options, counseling and coordination of care, risks and benefits of management, compliance, or risk factor reduction

## 2021-05-01 NOTE — Patient Instructions (Signed)
Atogepant tablets What is this medication? ATOGEPANT (a TOE je pant) is used to prevent migraine headaches. This medicine may be used for other purposes; ask your health care provider or pharmacist if you have questions. COMMON BRAND NAME(S): QULIPTA What should I tell my care team before I take this medication? They need to know if you have any of these conditions: kidney disease liver disease an unusual or allergic reaction to atogepant, other medicines, foods, dyes, or preservatives pregnant or trying to get pregnant breast-feeding How should I use this medication? Take this medicine by mouth with water. Take it as directed on the prescription label at the same time every day. You can take it with or without food. If it upsets your stomach, take it with food. Keep taking it unless your health care provider tells you to stop. Talk to your health care provider about the use of this medicine in children. Special care may be needed. Overdosage: If you think you have taken too much of this medicine contact a poison control center or emergency room at once. NOTE: This medicine is only for you. Do not share this medicine with others. What if I miss a dose? If you miss a dose, take it as soon as you can. If it is almost time for your next dose, take only that dose. Do not take double or extra doses. What may interact with this medication? carbamazepine certain medicines for fungal infections like itraconazole, ketoconazole clarithromycin cyclosporine efavirenz etravirine phenytoin rifampin [facility]. John's Wort This list may not describe all possible interactions. Give your health care provider a list of all the medicines, herbs, non-prescription drugs, or dietary supplements you use. Also tell them if you smoke, drink alcohol, or use illegal drugs. Some items may interact with your medicine. What should I watch for while using this medication? Visit your health care provider for regular  checks on your progress. Tell your health care provider if your symptoms do not start to get better or if they get worse. What side effects may I notice from receiving this medication? Side effects that you should report to your doctor or health care provider as soon as possible: allergic reactions (skin rash, itching or hives; swelling of the face, lips, tongue) light-colored stool liver injury (dark yellow or brown urine; general ill feeling or flu-like symptoms; loss of appetite, right upper belly pain; unusually weak or tired, yellowing of the eyes or skin) Side effects that usually do not require medical attention (report these to your doctor or health care provider if they continue or are bothersome): constipation lack or loss of appetite nausea unusually weak or tired weight loss This list may not describe all possible side effects. Call your doctor for medical advice about side effects. You may report side effects to FDA at 1-800-FDA-1088. Where should I keep my medication? Keep out of the reach of children and pets. Store at room temperature between 20 and 25 degrees C (68 and 77 degrees F). Get rid of any unused medicine after the expiration date. To get rid of medicines that are no longer needed or have expired: Take the medicine to a medicine take-back program. Check with your pharmacy or law enforcement to find a location. If you cannot return the medicine, check the label or package insert to see if the medicine should be thrown out in the garbage or flushed down the toilet. If you are not sure, ask your health care provider. If it is safe to put  it in the trash, take the medicine out of the container. Mix the medicine with cat litter, dirt, coffee grounds, or other unwanted substance. Seal the mixture in a bag or container. Put it in the trash. NOTE: This sheet is a summary. It may not cover all possible information. If you have questions about this medicine, talk to your doctor,  pharmacist, or health care provider.  2022 Elsevier/Gold Standard (2020-06-07 11:20:03)  OnabotulinumtoxinA injection (Medical Use) What is this medication? ONABOTULINUMTOXINA (o na BOTT you lye num tox in eh) is a neuro-muscular blocker. This medicine is used to treat crossed eyes, eyelid spasms, severe neck muscle spasms, ankle and toe muscle spasms, and elbow, wrist, and finger muscle spasms. It is also used to treat excessive underarm sweating, to prevent chronic migraine headaches, and to treat loss of bladder control due toneurologic conditions such as multiple sclerosis or spinal cord injury. This medicine may be used for other purposes; ask your health care provider orpharmacist if you have questions. COMMON BRAND NAME(S): Botox What should I tell my care team before I take this medication? They need to know if you have any of these conditions: breathing problems cerebral palsy spasms difficulty urinating heart problems history of surgery where this medicine is going to be used infection at the site where this medicine is going to be used myasthenia gravis or other neurologic disease nerve or muscle disease surgery plans take medicines that treat or prevent blood clots thyroid problems an unusual or allergic reaction to botulinum toxin, albumin, other medicines, foods, dyes, or preservatives pregnant or trying to get pregnant breast-feeding How should I use this medication? This medicine is for injection into a muscle. It is given by a health careprofessional in a hospital or clinic setting. Talk to your pediatrician regarding the use of this medicine in children. While this drug may be prescribed for children as young as 4 years old for selectedconditions, precautions do apply. Overdosage: If you think you have taken too much of this medicine contact apoison control center or emergency room at once. NOTE: This medicine is only for you. Do not share this medicine with  others. What if I miss a dose? This does not apply. What may interact with this medication? aminoglycoside antibiotics like gentamicin, neomycin, tobramycin muscle relaxants other botulinum toxin injections This list may not describe all possible interactions. Give your health care provider a list of all the medicines, herbs, non-prescription drugs, or dietary supplements you use. Also tell them if you smoke, drink alcohol, or use illegaldrugs. Some items may interact with your medicine. What should I watch for while using this medication? Visit your doctor for regular check ups. This medicine will cause weakness in the muscle where it is injected. Tell your doctor if you feel unusually weak in other muscles. Get medical help right awayif you have problems with breathing, swallowing, or talking. This medicine might make your eyelids droop or make you see blurry or double. If you have weak muscles or trouble seeing do not drive a car, use machinery,or do other dangerous activities. This medicine contains albumin from human blood. It may be possible to pass an infection in this medicine, but no cases have been reported. Talk to yourdoctor about the risks and benefits of this medicine. If your activities have been limited by your condition, go back to your regularroutine slowly after treatment with this medicine. What side effects may I notice from receiving this medication? Side effects that you should report to your  doctor or health care professionalas soon as possible: allergic reactions like skin rash, itching or hives, swelling of the face, lips, or tongue breathing problems changes in vision chest pain or tightness eye irritation, pain fast, irregular heartbeat infection numbness speech problems swallowing problems unusual weakness Side effects that usually do not require medical attention (report to yourdoctor or health care professional if they continue or are bothersome): bruising  or pain at site where injected drooping eyelid dry eyes or mouth headache muscles aches, pains sensitivity to light tearing This list may not describe all possible side effects. Call your doctor for medical advice about side effects. You may report side effects to FDA at1-800-FDA-1088. Where should I keep my medication? This drug is given in a hospital or clinic and will not be stored at home. NOTE: This sheet is a summary. It may not cover all possible information. If you have questions about this medicine, talk to your doctor, pharmacist, orhealth care provider.  2022 Elsevier/Gold Standard (2018-03-01 14:21:42)

## 2021-05-02 ENCOUNTER — Other Ambulatory Visit: Payer: Self-pay

## 2021-05-02 DIAGNOSIS — R42 Dizziness and giddiness: Secondary | ICD-10-CM | POA: Diagnosis not present

## 2021-05-02 DIAGNOSIS — R270 Ataxia, unspecified: Secondary | ICD-10-CM | POA: Diagnosis not present

## 2021-05-02 DIAGNOSIS — Z0000 Encounter for general adult medical examination without abnormal findings: Secondary | ICD-10-CM | POA: Diagnosis not present

## 2021-05-02 DIAGNOSIS — G43119 Migraine with aura, intractable, without status migrainosus: Secondary | ICD-10-CM | POA: Diagnosis not present

## 2021-05-02 MED ORDER — VALACYCLOVIR HCL 1 G PO TABS
ORAL_TABLET | ORAL | 0 refills | Status: DC
  Filled 2021-05-02: qty 10, 2d supply, fill #0

## 2021-05-04 ENCOUNTER — Telehealth: Payer: Self-pay | Admitting: Neurology

## 2021-05-04 ENCOUNTER — Encounter: Payer: Self-pay | Admitting: Neurology

## 2021-05-04 NOTE — Telephone Encounter (Signed)
Please initiate botox protocol. She can be placed on my schedule for the first time and I can transition to [facility] or amy she is fine with that. Inform her of the botox repayment plan and schedule appointment thanks. 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 I can mix my own botox on Friday. thanks

## 2021-05-08 NOTE — Telephone Encounter (Signed)
I called [facility] @ [phone] and spoke with Lowella Bandy to initiate PA for CPT [phone]. CPT [phone] does not require PA. Requesting 155 units every 12 weeks for dx G43.709. She advised me to fax records to [phone]. Pending reference #[id number].

## 2021-05-14 NOTE — Telephone Encounter (Signed)
Received approval from [facility]. PA [phone] (05/08/21- 11/06/21). Patient is scheduled for first injection tomorrow with Dr. Lucia Gaskins.

## 2021-05-15 ENCOUNTER — Ambulatory Visit: Payer: 59 | Admitting: Neurology

## 2021-05-15 ENCOUNTER — Other Ambulatory Visit: Payer: Self-pay

## 2021-05-15 DIAGNOSIS — G43709 Chronic migraine without aura, not intractable, without status migrainosus: Secondary | ICD-10-CM | POA: Diagnosis not present

## 2021-05-15 NOTE — Progress Notes (Signed)
Consent Form Botulism Toxin Injection For Chronic Migraine  First botox. 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 [employer] employee. Teresita's friend  Reviewed orally with patient, additionally signature is on file:  Botulism toxin has been approved by the Federal drug administration for treatment of chronic migraine. Botulism toxin does not cure chronic migraine and it may not be effective in some patients.  The administration of botulism toxin is accomplished by injecting a small amount of toxin into the muscles of the neck and head. Dosage must be titrated for each individual. Any benefits resulting from botulism toxin tend to wear off after 3 months with a repeat injection required if benefit is to be maintained. Injections are usually done every 3-4 months with maximum effect peak achieved by about 2 or 3 weeks. Botulism toxin is expensive and you should be sure of what costs you will incur resulting from the injection.  The side effects of botulism toxin use for chronic migraine may include:   -Transient, and usually mild, facial weakness with facial injections  -Transient, and usually mild, head or neck weakness with head/neck injections  -Reduction or loss of forehead facial animation due to forehead muscle weakness  -Eyelid drooping  -Dry eye  -Pain at the site of injection or bruising at the site of injection  -Double vision  -Potential unknown long term risks  Contraindications: You should not have Botox if you are pregnant, nursing, allergic to albumin, have an infection, skin condition, or muscle weakness at the site of the injection, or have myasthenia gravis, Lambert-Eaton syndrome, or ALS.  It is also possible that as with any injection, there may be an allergic reaction or no effect from the medication. Reduced effectiveness after repeated injections is sometimes seen and rarely infection at the injection site may occur. All care will be taken to prevent these side effects. If therapy is  given over a long time, atrophy and wasting in the muscle injected may occur. Occasionally the patient's become refractory to treatment because they develop antibodies to the toxin. In this event, therapy needs to be modified.  I have read the above information and consent to the administration of botulism toxin.    BOTOX PROCEDURE NOTE FOR MIGRAINE HEADACHE    Contraindications and precautions discussed with patient(above). Aseptic procedure was observed and patient tolerated procedure. Procedure performed by Dr. Artemio Aly  The condition has existed for more than 6 months, and pt does not have a diagnosis of ALS, Myasthenia Gravis or Lambert-Eaton Syndrome.  Risks and benefits of injections discussed and pt agrees to proceed with the procedure.  Written consent obtained  These injections are medically necessary. Pt  receives good benefits from these injections. These injections do not cause sedations or hallucinations which the oral therapies may cause.  Description of procedure:  The patient was placed in a sitting position. The standard protocol was used for Botox as follows, with 5 units of Botox injected at each site:   -Procerus muscle, midline injection  -Corrugator muscle, bilateral injection  -Frontalis muscle, bilateral injection, with 2 sites each side, medial injection was performed in the upper one third of the frontalis muscle, in the region vertical from the medial inferior edge of the superior orbital rim. The lateral injection was again in the upper one third of the forehead vertically above the lateral limbus of the cornea, 1.5 cm lateral to the medial injection site.  -Temporalis muscle injection, 4 sites, bilaterally. The first injection was 3 cm above the tragus  of the ear, second injection site was 1.5 cm to 3 cm up from the first injection site in line with the tragus of the ear. The third injection site was 1.5-3 cm forward between the first 2 injection sites. The  fourth injection site was 1.5 cm posterior to the second injection site.   -Occipitalis muscle injection, 3 sites, bilaterally. The first injection was done one half way between the occipital protuberance and the tip of the mastoid process behind the ear. The second injection site was done lateral and superior to the first, 1 fingerbreadth from the first injection. The third injection site was 1 fingerbreadth superiorly and medially from the first injection site.  -Cervical paraspinal muscle injection, 2 sites, bilateral knee first injection site was 1 cm from the midline of the cervical spine, 3 cm inferior to the lower border of the occipital protuberance. The second injection site was 1.5 cm superiorly and laterally to the first injection site.  -Trapezius muscle injection was performed at 3 sites, bilaterally. The first injection site was in the upper trapezius muscle halfway between the inflection point of the neck, and the acromion. The second injection site was one half way between the acromion and the first injection site. The third injection was done between the first injection site and the inflection point of the neck.   Will return for repeat injection in 3 months.   155 units of Botox was used, 45U Botox not injected was wasted. The patient tolerated the procedure well, there were no complications of the above procedure.

## 2021-05-15 NOTE — Progress Notes (Signed)
Botox- 200 units x 1 vial Lot: [id number] Expiration: 03/2022 NDC: 9381-0175-10  Bacteriostatic 0.9% Sodium Chloride- 26mL total Lot: [id number] Expiration: 09/08/2022 NDC: 7824-2353-61  Dx: W43.154 B/B

## 2021-05-29 ENCOUNTER — Ambulatory Visit: Payer: 59 | Admitting: Licensed Clinical Social Worker

## 2021-05-30 ENCOUNTER — Ambulatory Visit: Payer: 59 | Admitting: Licensed Clinical Social Worker

## 2021-06-06 ENCOUNTER — Ambulatory Visit: Payer: 59 | Admitting: Licensed Clinical Social Worker

## 2021-06-06 NOTE — Telephone Encounter (Signed)
CLOSED

## 2021-06-11 ENCOUNTER — Other Ambulatory Visit: Payer: Self-pay

## 2021-06-11 ENCOUNTER — Ambulatory Visit (INDEPENDENT_AMBULATORY_CARE_PROVIDER_SITE_OTHER): Payer: 59 | Admitting: Psychiatry

## 2021-06-11 ENCOUNTER — Encounter: Payer: Self-pay | Admitting: Psychiatry

## 2021-06-11 VITALS — BP 132/87 | HR 121 | Temp 98.8°F | Wt 156.0 lb

## 2021-06-11 DIAGNOSIS — F5105 Insomnia due to other mental disorder: Secondary | ICD-10-CM

## 2021-06-11 DIAGNOSIS — F410 Panic disorder [episodic paroxysmal anxiety] without agoraphobia: Secondary | ICD-10-CM | POA: Diagnosis not present

## 2021-06-11 DIAGNOSIS — F3342 Major depressive disorder, recurrent, in full remission: Secondary | ICD-10-CM

## 2021-06-11 DIAGNOSIS — R000 Tachycardia, unspecified: Secondary | ICD-10-CM

## 2021-06-11 MED ORDER — ALPRAZOLAM 0.25 MG PO TABS
0.2500 mg | ORAL_TABLET | Freq: Every evening | ORAL | 0 refills | Status: DC | PRN
  Filled 2021-06-11: qty 15, 15d supply, fill #0

## 2021-06-11 MED ORDER — BUPROPION HCL ER (SR) 150 MG PO TB12
ORAL_TABLET | Freq: Two times a day (BID) | ORAL | 0 refills | Status: DC
  Filled 2021-06-11: qty 180, 90d supply, fill #0

## 2021-06-11 MED ORDER — ESCITALOPRAM OXALATE 20 MG PO TABS
ORAL_TABLET | Freq: Every day | ORAL | 0 refills | Status: DC
  Filled 2021-06-11: qty 90, 90d supply, fill #0

## 2021-06-11 NOTE — Progress Notes (Signed)
[facility] MD OP Progress Note  06/11/2021 5:14 PM Candice [name]  MRN:  [id number]  Chief Complaint:  Chief Complaint   Follow-up; Anxiety; Depression    HPI: Candice [name] is a 35 year old Caucasian female, employed, lives in [place], has a history of MDD, panic attacks, insomnia, bilateral hearing loss, migraine headache was evaluated in office today.  Patient today reports her mood symptoms are currently stable.  She denies any significant anxiety or depression.  She reports sleep as good.  She is compliant on her medications at this time.  Patient reports in August she had a flareup of her ear problem, was nauseous and had vomiting due to the same.  She has was not compliant with her antidepressants which led to suicidal thoughts.  She reports  she had suicidal thoughts one night however she was able to talk to her husband who was available and that helped her.  She had fleeting thoughts about what she would do, had thoughts about hanging.  She however did not dwell on those thoughts and was able to also talk about this to her therapist.  She was able to get back on medications and currently denies any suicidality.  Patient reports her relationship with her husband as getting better.  She was able to take a cruise with her husband and daughter in September for 6 days.  That helped her a lot.  She reports the sexual side effects of medication as improved, she reports her relationship with her husband has improved and she is trying to be more intimate with him.  She is interested in sex therapy however has not been able to find a therapist yet.  Work continues to be stressful although she is managing it well.  Patient with tachycardia, currently denies any symptoms.  She is on propranolol however in spite of that her heart rate today is 121.  Patient denies any other concerns at this time.  Visit Diagnosis:    ICD-10-CM   1. MDD (major depressive disorder), recurrent, in full  remission (HCC)  F33.42 buPROPion (WELLBUTRIN SR) 150 MG 12 hr tablet    escitalopram (LEXAPRO) 20 MG tablet    2. Panic attacks  F41.0 ALPRAZolam (XANAX) 0.25 MG tablet    3. Insomnia due to mental condition  F51.05    Mood    4. Tachycardia  R00.0       Past Psychiatric History: Reviewed past psychiatric history from progress note on 01/10/2019  Past Medical History:  Past Medical History:  Diagnosis Date   Anxiety    Depression    Hearing loss    Migraines    a. x 10+ years.   Postpartum care following cesarean delivery (5/29) 02/04/2014   Pre-syncope    a. 07/2015 [facility] ED visit, ? vasovagal, improved with IVF.   Short cervix in second trimester, antepartum 11/16/2013    Past Surgical History:  Procedure Laterality Date   CESAREAN SECTION N/A 02/03/2014   Procedure: Primary Cesarean Section Delivery Baby Girl @ 2244, Apgars 9/9;  Surgeon: Serita Kyle, MD;  Location: [facility];  Service: Obstetrics;  Laterality: N/A;   CESAREAN SECTION     WISDOM TOOTH EXTRACTION  2002    Family Psychiatric History: Reviewed family psychiatric history from progress note on 01/10/2019  Family History:  Family History  Problem Relation Age of Onset   Depression Mother    Anxiety disorder Mother    Heart attack Mother  NSTEMI @ age 7.   Migraines Mother    Sleep apnea Father        alive @ 86.   Anxiety disorder Maternal Grandmother    Depression Maternal Grandmother    Diabetes Maternal Grandmother    Hypertension Maternal Grandmother    Heart attack Maternal Grandfather    Alcohol abuse Paternal Grandfather    Suicidality Paternal Grandfather     Social History: Reviewed social history from progress note on 01/10/2019 Social History   Socioeconomic History   Marital status: Married    Spouse name: Not on file   Number of children: 1   Years of education: Not on file   Highest education level: Not on file  Occupational History   Not on file  Tobacco Use    Smoking status: Never   Smokeless tobacco: Never  Vaping Use   Vaping Use: Never used  Substance and Sexual Activity   Alcohol use: Yes    Alcohol/week: 0.0 standard drinks    Comment: maybe two drinks/month.   Drug use: No   Sexual activity: Yes    Birth control/protection: Pill  Other Topics Concern   Not on file  Social History Narrative   Lives in [place] with her husband and 105 month old dtr.  Does not routinely exercise.       Social Determinants of Health   Financial Resource Strain: Not on file  Food Insecurity: Not on file  Transportation Needs: Not on file  Physical Activity: Not on file  Stress: Not on file  Social Connections: Not on file    Allergies:  Allergies  Allergen Reactions   Elemental Sulfur Hives   Sulfa Antibiotics Other (See Comments) and Hives    Metabolic Disorder Labs: No results found for: HGBA1C, MPG No results found for: PROLACTIN No results found for: CHOL, TRIG, HDL, CHOLHDL, VLDL, LDLCALC Lab Results  Component Value Date   TSH 0.696 07/12/2015    Therapeutic Level Labs: No results found for: LITHIUM No results found for: VALPROATE No components found for:  CBMZ  Current Medications: Current Outpatient Medications  Medication Sig Dispense Refill   ALPRAZolam (XANAX) 0.25 MG tablet Take 1 tablet (0.25 mg total) by mouth at bedtime as needed for anxiety. 15 tablet 0   cyclobenzaprine (FLEXERIL) 10 MG tablet TAKE 1 TABLET (10 MG TOTAL) BY MOUTH EVERY 8 (EIGHT) HOURS AS NEEDED FOR MUSCLE SPASMS. 40 tablet 2   eletriptan (RELPAX) 40 MG tablet TAKE 1 TABLET BY MOUTH AS NEEDED FOR MIGRAINE OR HEADACHE. MAY REPEAT IN 2 HOURS IF HEADACHE PERSISTS OR RECURS. 10 tablet 11   levonorgestrel-ethinyl estradiol (NORDETTE) 0.15-30 MG-MCG tablet TAKE 1 TABLET BY MOUTH ONCE DAILY (TAKE ACTIVE PILLS ONLY) 28 tablet 6   meclizine (ANTIVERT) 25 MG tablet TAKE 1 TABLET BY MOUTH TWO TIMES DAILY AS NEEDED FOR DIZZINESS FOR UP TO 10 DAYS 20 tablet 0    NURTEC 75 MG TBDP TAKE 1 TABLET BY MOUTH ONCE DAILY AS NEEDED. 8 tablet 2   ondansetron (ZOFRAN-ODT) 8 MG disintegrating tablet Take 1 tablet (8 mg total) by mouth every 8 (eight) hours as needed for nausea or vomiting. 20 tablet 0   promethazine (PHENERGAN) 25 MG tablet TAKE 1 TABLET BY MOUTH EVERY 8 (EIGHT) HOURS AS NEEDED FOR NAUSEA 30 tablet 0   propranolol ER (INDERAL LA) 60 MG 24 hr capsule Take 1 capsule (60 mg total) by mouth at bedtime. 30 capsule 6   Ubrogepant 100 MG TABS TAKE 1  TABLET BY MOUTH AS NEEDED. 16 tablet 11   buPROPion (WELLBUTRIN SR) 150 MG 12 hr tablet TAKE 1 TABLET BY MOUTH TWO TIMES DAILY 180 tablet 0   escitalopram (LEXAPRO) 20 MG tablet TAKE 1 TABLET BY MOUTH DAILY. 90 tablet 0   No current facility-administered medications for this visit.     Musculoskeletal: Strength & Muscle Tone: within normal limits Gait & Station: normal Patient leans: N/A  Psychiatric Specialty Exam: Review of Systems  Psychiatric/Behavioral:  Negative for agitation, behavioral problems, confusion, decreased concentration, dysphoric mood, hallucinations, self-injury, sleep disturbance and suicidal ideas. The patient is not nervous/anxious and is not hyperactive.   All other systems reviewed and are negative.  Blood pressure 132/87, pulse (!) 121, temperature 98.8 F (37.1 C), temperature source Temporal, weight 156 lb (70.8 kg).Body mass index is 26.78 kg/m.  General Appearance: Casual  Eye Contact:  Fair  Speech:  Clear and Coherent  Volume:  Normal  Mood:  Euthymic  Affect:  Congruent  Thought Process:  Goal Directed and Descriptions of Associations: Intact  Orientation:  Full (Time, Place, and Person)  Thought Content: Logical   Suicidal Thoughts:  No  Homicidal Thoughts:  No  Memory:  Immediate;   Fair Recent;   Fair Remote;   Fair  Judgement:  Fair  Insight:  Fair  Psychomotor Activity:  Normal  Concentration:  Concentration: Fair and Attention Span: Fair  Recall:   [employer] of Knowledge: Fair  Language: Fair  Akathisia:  No  Handed:  Right  AIMS (if indicated): done  Assets:  Communication Skills Desire for Improvement Housing Social Support  ADL's:  Intact  Cognition: WNL  Sleep:  Fair   Screenings: PHQ2-9    Flowsheet Row Video Visit from 10/30/2020 in [facility] Video Visit from 08/14/2020 in [facility]  PHQ-2 Total Score 1 2  PHQ-9 Total Score -- 8      Flowsheet Row Counselor from 04/23/2021 in [facility] Counselor from 11/28/2020 in [facility] Video Visit from 10/30/2020 in [facility]  C-SSRS RISK CATEGORY Low Risk No Risk No Risk        Assessment and Plan: XANA BRADT is a 35 year old Caucasian female, employed, married, has a history of depression, anxiety was evaluated in office today.  Patient is currently stable.  Plan as noted below. The patient demonstrates the following risk factors for suicide: Chronic risk factors for suicide include: psychiatric disorder of anxiety, depression and completed suicide in a family member. Acute risk factors for suicide include: family or marital conflict-although her relationship is currently improving.  Protective factors for this patient include: positive social support, positive therapeutic relationship, responsibility to others (children, family), coping skills, hope for the future, and life satisfaction. Considering these factors, the overall suicide risk at this point appears to be low. Patient is appropriate for outpatient follow up.  Plan MDD in remission Lexapro 20 mg p.o. daily Wellbutrin SR 150 mg p.o. twice daily  Panic attacks-stable Lexapro as prescribed. Xanax 0.25 mg p.o. daily as needed for severe anxiety attacks only.  Patient to limit use I have reviewed [place] PMP aware Continue CBT with Ms. Christina  Hussami  Insomnia-stable Continue sleep hygiene techniques  Tachycardia-unstable Patient currently is not symptomatic.  She is on propranolol prescribed by neurology. Patient to monitor her heart rate. Patient to follow up with primary care provider for further management. Patient is due for thyroid labs, thyroid TSH ,  last done 06/2020. We will coordinate care.  Follow-up in clinic in 2 months or sooner if needed.  This note was generated in part or whole with voice recognition software. Voice recognition is usually quite accurate but there are transcription errors that can and very often do occur. I apologize for any typographical errors that were not detected and corrected.      Jomarie Longs, MD 06/11/2021, 5:14 PM

## 2021-06-25 ENCOUNTER — Other Ambulatory Visit: Payer: Self-pay

## 2021-06-25 MED ORDER — VALACYCLOVIR HCL 1 G PO TABS
ORAL_TABLET | ORAL | 0 refills | Status: DC
  Filled 2021-06-25: qty 10, 3d supply, fill #0

## 2021-06-25 MED ORDER — LEVONORGESTREL-ETHINYL ESTRAD 0.15-30 MG-MCG PO TABS
ORAL_TABLET | ORAL | 1 refills | Status: DC
  Filled 2021-06-25: qty 112, 84d supply, fill #0
  Filled 2021-09-13: qty 112, 84d supply, fill #1

## 2021-06-25 MED ORDER — ONDANSETRON 8 MG PO TBDP
8.0000 mg | ORAL_TABLET | Freq: Three times a day (TID) | ORAL | 0 refills | Status: DC | PRN
  Filled 2021-06-25: qty 20, 7d supply, fill #0

## 2021-06-27 ENCOUNTER — Ambulatory Visit: Payer: 59 | Admitting: Licensed Clinical Social Worker

## 2021-06-28 DIAGNOSIS — Z0000 Encounter for general adult medical examination without abnormal findings: Secondary | ICD-10-CM | POA: Diagnosis not present

## 2021-06-28 DIAGNOSIS — E538 Deficiency of other specified B group vitamins: Secondary | ICD-10-CM | POA: Diagnosis not present

## 2021-06-28 DIAGNOSIS — Z13220 Encounter for screening for lipoid disorders: Secondary | ICD-10-CM | POA: Diagnosis not present

## 2021-06-28 DIAGNOSIS — Z1159 Encounter for screening for other viral diseases: Secondary | ICD-10-CM | POA: Diagnosis not present

## 2021-06-28 DIAGNOSIS — G43119 Migraine with aura, intractable, without status migrainosus: Secondary | ICD-10-CM | POA: Diagnosis not present

## 2021-06-28 DIAGNOSIS — Z131 Encounter for screening for diabetes mellitus: Secondary | ICD-10-CM | POA: Diagnosis not present

## 2021-07-02 ENCOUNTER — Other Ambulatory Visit: Payer: Self-pay

## 2021-07-04 ENCOUNTER — Other Ambulatory Visit: Payer: Self-pay

## 2021-07-04 MED ORDER — CYANOCOBALAMIN 1000 MCG/ML IJ SOLN
INTRAMUSCULAR | 3 refills | Status: DC
  Filled 2021-07-04: qty 1, 28d supply, fill #0
  Filled 2021-10-15: qty 1, 28d supply, fill #1
  Filled 2022-01-07: qty 1, 28d supply, fill #2
  Filled 2022-01-30: qty 1, 30d supply, fill #3

## 2021-07-04 MED ORDER — "VANISHPOINT TUBERCULIN SYRINGE 25G X 1"" 1 ML MISC"
3 refills | Status: DC
  Filled 2022-01-07 – 2022-06-01 (×3): qty 1, fill #0

## 2021-07-10 ENCOUNTER — Ambulatory Visit: Payer: 59 | Admitting: Neurology

## 2021-07-24 ENCOUNTER — Other Ambulatory Visit: Payer: Self-pay

## 2021-07-24 ENCOUNTER — Ambulatory Visit (INDEPENDENT_AMBULATORY_CARE_PROVIDER_SITE_OTHER): Payer: 59 | Admitting: Licensed Clinical Social Worker

## 2021-07-24 DIAGNOSIS — F3342 Major depressive disorder, recurrent, in full remission: Secondary | ICD-10-CM

## 2021-07-25 NOTE — Plan of Care (Signed)
  Problem: Anxiety Disorder CCP Problem  1 Reduce overall frequency, intensity, and duration of the anxiety so that daily functioning is not impaired.  Goal: LTG: Patient will score less than 5 on the Generalized Anxiety Disorder 7 Scale (GAD-7) Outcome: Progressing Goal: STG: Patient will participate in at least 80% of scheduled individual psychotherapy sessions Outcome: Progressing Intervention: PROVIDE Tannya EDUCATION ON COMMUNICATION PATTERNS IN RELATIONSHIPS Intervention: Educate patient on: Stress management Intervention: Assist with relaxation techniques, as appropriate (deep breathing exercises, meditation, guided imagery) Intervention: Assist with coping skills and behavior

## 2021-07-25 NOTE — Progress Notes (Signed)
  THERAPIST PROGRESS NOTE  Session Time: 4-435p  Participation Level: Active  Behavioral Response: Neat and Well GroomedAlertAnxious  Type of Therapy: Individual Therapy  Treatment Goals addressed:  Problem: Anxiety Disorder CCP Problem  1 Reduce overall frequency, intensity, and duration of the anxiety so that daily functioning is not impaired.  Goal: LTG: Patient will score less than 5 on the Generalized Anxiety Disorder 7 Scale (GAD-7) Outcome: Progressing Goal: STG: Patient will participate in at least 80% of scheduled individual psychotherapy sessions Outcome: Progressing Interventions:  Intervention: PROVIDE [name] EDUCATION ON COMMUNICATION PATTERNS IN RELATIONSHIPS Intervention: Educate patient on: Stress management Intervention: Assist with relaxation techniques, as appropriate (deep breathing exercises, meditation, guided imagery) Intervention: Assist with coping skills and behavior   Summary: Candice [name] is a 35 y.o. female who presents with improving symptoms related to depression diagnosis. Pt reports that mood is stable--can be triggered at times by situational stressors. Pt reports good quality and quantity of sleep.    Allowed pt to explore and express thoughts and feelings associated with recent life situations and external stressors. Discussed relationship with husband and extended family members--and how current family conflict situations could impact holiday plans. Discussed overall psychological toll that conflict has triggered recently. Pt reports that she is supportive of husband and is okay no matter what happens in the future.  Discussed pts thoughts re: current family members and weighed out pros and cons of making big decisions. Encouraged to let "emotional" part of the brain cool down to let "logic and reason/wise mind" portion of the brain make big life decisions.   Discussed work--pt is happy and feels well supported by colleagues.  Continued  recommendations are as follows: self care behaviors, positive social engagements, focusing on overall work/home/life balance, and focusing on positive physical and emotional wellness.    Suicidal/Homicidal: No  Therapist Response: Pt is continuing to apply interventions learned in session into daily life situations. Pt is currently on track to meet goals utilizing interventions mentioned above. Personal growth and progress noted. Treatment to continue as indicated.   Plan: Return again in 4 weeks.  Diagnosis: Axis I: MDD, recurrent, in remission    Axis II: No diagnosis    Ernest Haber [name], LCSW 07/25/2021

## 2021-08-12 ENCOUNTER — Other Ambulatory Visit: Payer: Self-pay

## 2021-08-12 ENCOUNTER — Other Ambulatory Visit: Payer: Self-pay | Admitting: Psychiatry

## 2021-08-12 DIAGNOSIS — F3342 Major depressive disorder, recurrent, in full remission: Secondary | ICD-10-CM

## 2021-08-13 ENCOUNTER — Encounter: Payer: Self-pay | Admitting: Psychiatry

## 2021-08-13 ENCOUNTER — Other Ambulatory Visit: Payer: Self-pay

## 2021-08-13 ENCOUNTER — Ambulatory Visit (INDEPENDENT_AMBULATORY_CARE_PROVIDER_SITE_OTHER): Payer: 59 | Admitting: Psychiatry

## 2021-08-13 VITALS — BP 126/83 | HR 98 | Temp 98.7°F | Wt 161.8 lb

## 2021-08-13 DIAGNOSIS — F3342 Major depressive disorder, recurrent, in full remission: Secondary | ICD-10-CM

## 2021-08-13 DIAGNOSIS — F410 Panic disorder [episodic paroxysmal anxiety] without agoraphobia: Secondary | ICD-10-CM

## 2021-08-13 DIAGNOSIS — F5105 Insomnia due to other mental disorder: Secondary | ICD-10-CM | POA: Diagnosis not present

## 2021-08-13 MED ORDER — ESCITALOPRAM OXALATE 20 MG PO TABS
ORAL_TABLET | Freq: Every day | ORAL | 0 refills | Status: DC
  Filled 2021-08-13: qty 90, 90d supply, fill #0

## 2021-08-13 MED ORDER — BUPROPION HCL ER (SR) 150 MG PO TB12
ORAL_TABLET | Freq: Two times a day (BID) | ORAL | 0 refills | Status: DC
  Filled 2021-08-13: qty 180, 90d supply, fill #0

## 2021-08-13 MED ORDER — ALPRAZOLAM 0.25 MG PO TABS
0.2500 mg | ORAL_TABLET | Freq: Every evening | ORAL | 0 refills | Status: DC | PRN
  Filled 2021-08-13: qty 15, 15d supply, fill #0

## 2021-08-13 NOTE — Progress Notes (Signed)
BH MD OP Progress Note  08/13/2021 4:54 PM Candice [name]  MRN:  [id number]  Chief Complaint:  Chief Complaint   Follow-up; Anxiety    HPI: Candice [name] is a 35 year old Caucasian female, employed, lives in [place], has a history of MDD, panic attack, insomnia, bilateral hearing loss, migraine headache was evaluated in office today.  Patient today reports overall she is doing well with regards to her mood.  She had a good Thanksgiving holiday.  She also celebrated her birthday recently.  Patient reports she continues to work on her relationship with her husband which is currently going well.  Patient reports work is going well.  She is able to cope with her anxiety better.  She is compliant on medications as prescribed.  Denies side effects.  Patient denies any suicidality, homicidality or perceptual disturbances.  Patient denies any other concerns today.  Visit Diagnosis:    ICD-10-CM   1. MDD (major depressive disorder), recurrent, in full remission (HCC)  F33.42     2. Panic attacks  F41.0 ALPRAZolam (XANAX) 0.25 MG tablet    3. Insomnia due to mental condition  F51.05    mood      Past Psychiatric History: Reviewed past psychiatric history from progress note on 01/10/2019  Past Medical History:  Past Medical History:  Diagnosis Date   Anxiety    Depression    Hearing loss    Migraines    a. x 10+ years.   Postpartum care following cesarean delivery (5/29) 02/04/2014   Pre-syncope    a. 07/2015 [facility] ED visit, ? vasovagal, improved with IVF.   Short cervix in second trimester, antepartum 11/16/2013    Past Surgical History:  Procedure Laterality Date   CESAREAN SECTION N/A 02/03/2014   Procedure: Primary Cesarean Section Delivery Baby Girl @ 2244, Apgars 9/9;  Surgeon: Serita Kyle, MD;  Location: [facility];  Service: Obstetrics;  Laterality: N/A;   CESAREAN SECTION     WISDOM TOOTH EXTRACTION  2002    Family Psychiatric History: Reviewed family  psychiatric history from progress note on 01/10/2019  Family History:  Family History  Problem Relation Age of Onset   Depression Mother    Anxiety disorder Mother    Heart attack Mother        NSTEMI @ age 41.   Migraines Mother    Sleep apnea Father        alive @ 68.   Anxiety disorder Maternal Grandmother    Depression Maternal Grandmother    Diabetes Maternal Grandmother    Hypertension Maternal Grandmother    Heart attack Maternal Grandfather    Alcohol abuse Paternal Grandfather    Suicidality Paternal Grandfather     Social History: Reviewed social history from progress note on 01/10/2019 Social History   Socioeconomic History   Marital status: Married    Spouse name: Not on file   Number of children: 1   Years of education: Not on file   Highest education level: Not on file  Occupational History   Not on file  Tobacco Use   Smoking status: Never   Smokeless tobacco: Never  Vaping Use   Vaping Use: Never used  Substance and Sexual Activity   Alcohol use: Yes    Alcohol/week: 0.0 standard drinks    Comment: maybe two drinks/month.   Drug use: No   Sexual activity: Yes    Birth control/protection: Pill  Other Topics Concern   Not on file  Social  History Narrative   Lives in [place] with her husband and 6 month old dtr.  Does not routinely exercise.       Social Determinants of Health   Financial Resource Strain: Not on file  Food Insecurity: Not on file  Transportation Needs: Not on file  Physical Activity: Not on file  Stress: Not on file  Social Connections: Not on file    Allergies:  Allergies  Allergen Reactions   Elemental Sulfur Hives   Sulfa Antibiotics Other (See Comments) and Hives    Metabolic Disorder Labs: No results found for: HGBA1C, MPG No results found for: PROLACTIN No results found for: CHOL, TRIG, HDL, CHOLHDL, VLDL, LDLCALC Lab Results  Component Value Date   TSH 0.696 07/12/2015    Therapeutic Level Labs: No results  found for: LITHIUM No results found for: VALPROATE No components found for:  CBMZ  Current Medications: Current Outpatient Medications  Medication Sig Dispense Refill   cyanocobalamin (,VITAMIN B-12,) 1000 MCG/ML injection Inject 1 mL (1,000 mcg total) into the muscle monthly for 4 doses 1 mL 3   cyanocobalamin (,VITAMIN B-12,) 1000 MCG/ML injection Inject into the muscle.     cyclobenzaprine (FLEXERIL) 10 MG tablet TAKE 1 TABLET (10 MG TOTAL) BY MOUTH EVERY 8 (EIGHT) HOURS AS NEEDED FOR MUSCLE SPASMS. 40 tablet 2   eletriptan (RELPAX) 40 MG tablet TAKE 1 TABLET BY MOUTH AS NEEDED FOR MIGRAINE OR HEADACHE. MAY REPEAT IN 2 HOURS IF HEADACHE PERSISTS OR RECURS. 10 tablet 11   levonorgestrel-ethinyl estradiol (NORDETTE) 0.15-30 MG-MCG tablet Take 1 tablet by mouth once daily. Take active pills ONLY 112 tablet 1   levonorgestrel-ethinyl estradiol (NORDETTE) 0.15-30 MG-MCG tablet Take by mouth.     NURTEC 75 MG TBDP TAKE 1 TABLET BY MOUTH ONCE DAILY AS NEEDED. 8 tablet 2   ondansetron (ZOFRAN-ODT) 8 MG disintegrating tablet Take 1 tablet (8 mg total) by mouth every 8 (eight) hours as needed for Nausea or Vomiting 20 tablet 0   promethazine (PHENERGAN) 25 MG tablet TAKE 1 TABLET BY MOUTH EVERY 8 (EIGHT) HOURS AS NEEDED FOR NAUSEA 30 tablet 0   propranolol ER (INDERAL LA) 60 MG 24 hr capsule Take 1 capsule (60 mg total) by mouth at bedtime. 30 capsule 6   Tuberculin-Allergy Syringes (VANISHPOINT TUBERCULIN SYRINGE) 25G X 1" 1 ML MISC Use as directed for up to 30 days 1 each 3   Ubrogepant 100 MG TABS TAKE 1 TABLET BY MOUTH AS NEEDED. 16 tablet 11   valACYclovir (VALTREX) 1000 MG tablet Take 2 tablets (2,000 mg total) by mouth 2 (two) times daily Take for 2 days and monitor 10 tablet 0   valACYclovir (VALTREX) 1000 MG tablet Take by mouth.     ALPRAZolam (XANAX) 0.25 MG tablet Take 1 tablet (0.25 mg total) by mouth at bedtime as needed for anxiety. 15 tablet 0   buPROPion (WELLBUTRIN SR) 150 MG 12 hr  tablet TAKE 1 TABLET BY MOUTH TWO TIMES DAILY 180 tablet 0   escitalopram (LEXAPRO) 20 MG tablet TAKE 1 TABLET BY MOUTH DAILY. 90 tablet 0   No current facility-administered medications for this visit.     Musculoskeletal: Strength & Muscle Tone: within normal limits Gait & Station: normal Patient leans: N/A  Psychiatric Specialty Exam: Review of Systems  Psychiatric/Behavioral:  Negative for agitation, behavioral problems, confusion, decreased concentration, dysphoric mood, hallucinations, self-injury, sleep disturbance and suicidal ideas. The patient is not nervous/anxious and is not hyperactive.   All other systems reviewed and  are negative.  Blood pressure 126/83, pulse 98, temperature 98.7 F (37.1 C), temperature source Temporal, weight 161 lb 12.8 oz (73.4 kg).Body mass index is 27.77 kg/m.  General Appearance: Casual  Eye Contact:  Fair  Speech:  Clear and Coherent  Volume:  Normal  Mood:  Euthymic  Affect:  Congruent  Thought Process:  Goal Directed and Descriptions of Associations: Intact  Orientation:  Full (Time, Place, and Person)  Thought Content: Logical   Suicidal Thoughts:  No  Homicidal Thoughts:  No  Memory:  Immediate;   Fair Recent;   Fair Remote;   Fair  Judgement:  Fair  Insight:  Fair  Psychomotor Activity:  Normal  Concentration:  Concentration: Fair and Attention Span: Fair  Recall:  [employer] of Knowledge: Fair  Language: Fair  Akathisia:  No  Handed:  Right  AIMS (if indicated): done, 0  Assets:  Communication Skills Desire for Improvement Housing Intimacy Talents/Skills  ADL's:  Intact  Cognition: WNL  Sleep:  Good   Screenings: PHQ2-9    Flowsheet Row Office Visit from 08/13/2021 in [facility] Counselor from 07/24/2021 in [facility] Office Visit from 06/11/2021 in [facility] Video Visit from 10/30/2020 in [facility]  Video Visit from 08/14/2020 in [facility]  PHQ-2 Total Score 1 2 2 1 2   PHQ-9 Total Score -- -- 4 -- 8      Flowsheet Row Office Visit from 08/13/2021 in [facility] Counselor from 07/24/2021 in [facility] Office Visit from 06/11/2021 in [facility]  C-SSRS RISK CATEGORY No Risk No Risk No Risk        Assessment and Plan: Candice [name] is a 35 year old Caucasian female, employed, married, has a history of depression, anxiety was evaluated in office today.  Patient is currently stable.  Plan as noted below.  Plan  MDD in remission Lexapro 20 mg p.o. daily Wellbutrin SR 150 mg p.o. twice daily  Panic attacks-stable Lexapro as prescribed Xanax 0.25 mg p.o. daily as needed for severe anxiety attacks. Reviewed [place] PMP aware Continue CBT with Ms. Christina Hussami  Insomnia-stable Continued sleep hygiene techniques  Provided information for other psychotherapist in the area since she is interested in having more frequent sessions.  Follow-up in clinic in 3 months or sooner in person.  This note was generated in part or whole with voice recognition software. Voice recognition is usually quite accurate but there are transcription errors that can and very often do occur. I apologize for any typographical errors that were not detected and corrected.     30, MD 08/14/2021, 8:14 AM

## 2021-08-13 NOTE — Patient Instructions (Signed)
[facility]  Joanna Warren  Call - [phone]  [email or web address]  

## 2021-08-14 ENCOUNTER — Ambulatory Visit: Payer: 59 | Admitting: Neurology

## 2021-08-14 ENCOUNTER — Other Ambulatory Visit: Payer: Self-pay

## 2021-08-14 DIAGNOSIS — H5213 Myopia, bilateral: Secondary | ICD-10-CM | POA: Diagnosis not present

## 2021-08-14 NOTE — Progress Notes (Deleted)
Consent Form Botulism Toxin Injection For Chronic Migraine  First botox. She is not a clencher. +a. [employer] employee. Teresita's friend  Reviewed orally with patient, additionally signature is on file:  Botulism toxin has been approved by the Federal drug administration for treatment of chronic migraine. Botulism toxin does not cure chronic migraine and it may not be effective in some patients.  The administration of botulism toxin is accomplished by injecting a small amount of toxin into the muscles of the neck and head. Dosage must be titrated for each individual. Any benefits resulting from botulism toxin tend to wear off after 3 months with a repeat injection required if benefit is to be maintained. Injections are usually done every 3-4 months with maximum effect peak achieved by about 2 or 3 weeks. Botulism toxin is expensive and you should be sure of what costs you will incur resulting from the injection.  The side effects of botulism toxin use for chronic migraine may include:   -Transient, and usually mild, facial weakness with facial injections  -Transient, and usually mild, head or neck weakness with head/neck injections  -Reduction or loss of forehead facial animation due to forehead muscle weakness  -Eyelid drooping  -Dry eye  -Pain at the site of injection or bruising at the site of injection  -Double vision  -Potential unknown long term risks  Contraindications: You should not have Botox if you are pregnant, nursing, allergic to albumin, have an infection, skin condition, or muscle weakness at the site of the injection, or have myasthenia gravis, Lambert-Eaton syndrome, or ALS.  It is also possible that as with any injection, there may be an allergic reaction or no effect from the medication. Reduced effectiveness after repeated injections is sometimes seen and rarely infection at the injection site may occur. All care will be taken to prevent these side effects. If therapy is  given over a long time, atrophy and wasting in the muscle injected may occur. Occasionally the patient's become refractory to treatment because they develop antibodies to the toxin. In this event, therapy needs to be modified.  I have read the above information and consent to the administration of botulism toxin.    BOTOX PROCEDURE NOTE FOR MIGRAINE HEADACHE    Contraindications and precautions discussed with patient(above). Aseptic procedure was observed and patient tolerated procedure. Procedure performed by Dr. Toni Rafal Archuleta  The condition has existed for more than 6 months, and pt does not have a diagnosis of ALS, Myasthenia Gravis or Lambert-Eaton Syndrome.  Risks and benefits of injections discussed and pt agrees to proceed with the procedure.  Written consent obtained  These injections are medically necessary. Pt  receives good benefits from these injections. These injections do not cause sedations or hallucinations which the oral therapies may cause.  Description of procedure:  The patient was placed in a sitting position. The standard protocol was used for Botox as follows, with 5 units of Botox injected at each site:   -Procerus muscle, midline injection  -Corrugator muscle, bilateral injection  -Frontalis muscle, bilateral injection, with 2 sites each side, medial injection was performed in the upper one third of the frontalis muscle, in the region vertical from the medial inferior edge of the superior orbital rim. The lateral injection was again in the upper one third of the forehead vertically above the lateral limbus of the cornea, 1.5 cm lateral to the medial injection site.  -Temporalis muscle injection, 4 sites, bilaterally. The first injection was 3 cm above the tragus   of the ear, second injection site was 1.5 cm to 3 cm up from the first injection site in line with the tragus of the ear. The third injection site was 1.5-3 cm forward between the first 2 injection sites. The  fourth injection site was 1.5 cm posterior to the second injection site.   -Occipitalis muscle injection, 3 sites, bilaterally. The first injection was done one half way between the occipital protuberance and the tip of the mastoid process behind the ear. The second injection site was done lateral and superior to the first, 1 fingerbreadth from the first injection. The third injection site was 1 fingerbreadth superiorly and medially from the first injection site.  -Cervical paraspinal muscle injection, 2 sites, bilateral knee first injection site was 1 cm from the midline of the cervical spine, 3 cm inferior to the lower border of the occipital protuberance. The second injection site was 1.5 cm superiorly and laterally to the first injection site.  -Trapezius muscle injection was performed at 3 sites, bilaterally. The first injection site was in the upper trapezius muscle halfway between the inflection point of the neck, and the acromion. The second injection site was one half way between the acromion and the first injection site. The third injection was done between the first injection site and the inflection point of the neck.   Will return for repeat injection in 3 months.   155 units of Botox was used, 45U Botox not injected was wasted. The patient tolerated the procedure well, there were no complications of the above procedure.

## 2021-08-15 ENCOUNTER — Other Ambulatory Visit: Payer: Self-pay

## 2021-08-15 ENCOUNTER — Ambulatory Visit (INDEPENDENT_AMBULATORY_CARE_PROVIDER_SITE_OTHER): Payer: 59 | Admitting: Adult Health

## 2021-08-15 DIAGNOSIS — G43709 Chronic migraine without aura, not intractable, without status migrainosus: Secondary | ICD-10-CM

## 2021-08-15 NOTE — Progress Notes (Signed)
Botox consent signed  Botox- 200 units x 1 vial Lot: [id number] Expiration: 01/2024 NDC: 0131-4388-87  Dx: N79.728 B/B

## 2021-08-15 NOTE — Progress Notes (Signed)
    Update 08/15/21: Feels that botox has helped. Followed protocol.    BOTOX PROCEDURE NOTE FOR MIGRAINE HEADACHE    Contraindications and precautions discussed with patient(above). Aseptic procedure was observed and patient tolerated procedure. Procedure performed by Butch Penny, NP  The condition has existed for more than 6 months, and pt does not have a diagnosis of ALS, Myasthenia Gravis or Lambert-Eaton Syndrome.  Risks and benefits of injections discussed and pt agrees to proceed with the procedure.  Written consent obtained  These injections are medically necessary. These injections do not cause sedations or hallucinations which the oral therapies may cause.  Indication/Diagnosis: chronic migraine BOTOX(J0585) injection was performed according to protocol by [employer]. 200 units of BOTOX was dissolved into 4 cc NS.   NDC: [address]-94  Type of toxin: Botox  Botox consent signed  Botox- 200 units x 1 vial Lot: [id number] Expiration: 01/2024 NDC: 4818-5631-49   Dx: F02.637   Description of procedure:  The patient was placed in a sitting position. The standard protocol was used for Botox as follows, with 5 units of Botox injected at each site:   -Procerus muscle, midline injection  -Corrugator muscle, bilateral injection  -Frontalis muscle, bilateral injection, with 2 sites each side, medial injection was performed in the upper one third of the frontalis muscle, in the region vertical from the medial inferior edge of the superior orbital rim. The lateral injection was again in the upper one third of the forehead vertically above the lateral limbus of the cornea, 1.5 cm lateral to the medial injection site.  -Temporalis muscle injection, 4 sites, bilaterally. The first injection was 3 cm above the tragus of the ear, second injection site was 1.5 cm to 3 cm up from the first injection site in line with the tragus of the ear. The third injection site was 1.5-3 cm forward  between the first 2 injection sites. The fourth injection site was 1.5 cm posterior to the second injection site.  -Occipitalis muscle injection, 3 sites, bilaterally. The first injection was done one half way between the occipital protuberance and the tip of the mastoid process behind the ear. The second injection site was done lateral and superior to the first, 1 fingerbreadth from the first injection. The third injection site was 1 fingerbreadth superiorly and medially from the first injection site.  -Cervical paraspinal muscle injection, 2 sites, bilateral knee first injection site was 1 cm from the midline of the cervical spine, 3 cm inferior to the lower border of the occipital protuberance. The second injection site was 1.5 cm superiorly and laterally to the first injection site.  -Trapezius muscle injection was performed at 3 sites, bilaterally. The first injection site was in the upper trapezius muscle halfway between the inflection point of the neck, and the acromion. The second injection site was one half way between the acromion and the first injection site. The third injection was done between the first injection site and the inflection point of the neck.   Will return for repeat injection in 3 months.   A 200 units of Botox was used, 155 units were injected, the rest of the Botox was wasted. The patient tolerated the procedure well, there were no complications of the above procedure.  Butch Penny, MSN, NP-C 08/15/2021, 3:13 PM [facility] [address], [address] [place], [place] [address] [phone]

## 2021-08-19 ENCOUNTER — Other Ambulatory Visit: Payer: Self-pay

## 2021-08-22 ENCOUNTER — Other Ambulatory Visit: Payer: Self-pay

## 2021-08-23 ENCOUNTER — Other Ambulatory Visit: Payer: Self-pay

## 2021-08-23 MED ORDER — ONDANSETRON HCL 8 MG PO TABS
8.0000 mg | ORAL_TABLET | Freq: Three times a day (TID) | ORAL | 0 refills | Status: DC | PRN
  Filled 2021-08-23: qty 20, 7d supply, fill #0

## 2021-08-28 DIAGNOSIS — Z01419 Encounter for gynecological examination (general) (routine) without abnormal findings: Secondary | ICD-10-CM | POA: Diagnosis not present

## 2021-08-28 DIAGNOSIS — R6882 Decreased libido: Secondary | ICD-10-CM | POA: Diagnosis not present

## 2021-08-29 ENCOUNTER — Other Ambulatory Visit: Payer: Self-pay

## 2021-08-29 ENCOUNTER — Telehealth: Payer: 59 | Admitting: Physician Assistant

## 2021-08-29 DIAGNOSIS — B001 Herpesviral vesicular dermatitis: Secondary | ICD-10-CM

## 2021-08-29 MED ORDER — VALACYCLOVIR HCL 1 G PO TABS
2000.0000 mg | ORAL_TABLET | Freq: Two times a day (BID) | ORAL | 0 refills | Status: AC
  Filled 2021-08-29: qty 4, 1d supply, fill #0

## 2021-08-29 NOTE — Progress Notes (Signed)

## 2021-09-13 ENCOUNTER — Other Ambulatory Visit: Payer: Self-pay

## 2021-09-18 ENCOUNTER — Ambulatory Visit (INDEPENDENT_AMBULATORY_CARE_PROVIDER_SITE_OTHER): Payer: 59 | Admitting: Licensed Clinical Social Worker

## 2021-09-18 ENCOUNTER — Other Ambulatory Visit: Payer: Self-pay

## 2021-09-18 DIAGNOSIS — F411 Generalized anxiety disorder: Secondary | ICD-10-CM | POA: Diagnosis not present

## 2021-09-18 DIAGNOSIS — F3342 Major depressive disorder, recurrent, in full remission: Secondary | ICD-10-CM | POA: Diagnosis not present

## 2021-09-18 NOTE — Plan of Care (Signed)
  Problem: Anxiety Disorder CCP Problem  1 Reduce overall frequency, intensity, and duration of the anxiety so that daily functioning is not impaired. Goal: LTG: Patient will score less than 5 on the Generalized Anxiety Disorder 7 Scale (GAD-7) Outcome: Progressing Goal: STG: Patient will participate in at least 80% of scheduled individual psychotherapy sessions Outcome: Progressing   

## 2021-09-18 NOTE — Progress Notes (Signed)
°  THERAPIST PROGRESS NOTE  Session Time: 4-5p  Participation Level: Active  Behavioral Response: Neat and Well GroomedAlertAnxious  Type of Therapy: Individual Therapy  Treatment Goals addressed:  Problem: Anxiety Disorder CCP Problem  1 Reduce overall frequency, intensity, and duration of the anxiety so that daily functioning is not impaired.  Goal: LTG: Patient will score less than 5 on the Generalized Anxiety Disorder 7 Scale (GAD-7) Outcome: Progressing  Goal: STG: Patient will participate in at least 80% of scheduled individual psychotherapy sessions Outcome: Progressing  Interventions:  Intervention: PROVIDE Doretta EDUCATION ON COMMUNICATION PATTERNS IN RELATIONSHIPS Intervention: Educate patient on: Stress management Intervention: Assist with relaxation techniques, as appropriate (deep breathing exercises, meditation, guided imagery) Intervention: Assist with coping skills and behavior   Summary: Candice [name] is a 36 y.o. female who presents with improving symptoms related to depression diagnosis. Pt reports that mood is stable--can be triggered at times by situational stressors. Pt reports good quality and quantity of sleep.    Allowed pt to explore and express thoughts and feelings associated with recent life situations and external stressors. Discussed relationship with husband and extended family members. Explored continuing conflict between pts husband and his twin brother. Pt reports that she is supportive of husband and is okay no matter what happens in the future.  Discussed pts thoughts re: current family members and weighed out pros and cons of making big decisions. Encouraged to let "emotional" part of the brain cool down to let "logic and reason/wise mind" portion of the brain make big life decisions.   Discussed work--pt is happy and feels well supported by colleagues.  Continued recommendations are as follows: self care behaviors, positive social  engagements, focusing on overall work/home/life balance, and focusing on positive physical and emotional wellness.    Suicidal/Homicidal: No  Therapist Response: Pt is continuing to apply interventions learned in session into daily life situations. Pt is currently on track to meet goals utilizing interventions mentioned above. Personal growth and progress noted. Treatment to continue as indicated.   Plan: Return again in 4 weeks.  Diagnosis: Axis I: MDD, recurrent, in remission; anxiety state    Axis II: No diagnosis    Ernest Haber Janasha Barkalow, LCSW 09/18/2021

## 2021-10-15 ENCOUNTER — Other Ambulatory Visit: Payer: Self-pay

## 2021-10-16 ENCOUNTER — Other Ambulatory Visit: Payer: Self-pay

## 2021-10-17 ENCOUNTER — Other Ambulatory Visit: Payer: Self-pay

## 2021-10-17 MED ORDER — PAXLOVID (300/100) 20 X 150 MG & 10 X 100MG PO TBPK
ORAL_TABLET | ORAL | 0 refills | Status: DC
  Filled 2021-10-17: qty 30, 5d supply, fill #0

## 2021-10-22 ENCOUNTER — Telehealth: Payer: Self-pay | Admitting: Adult Health

## 2021-10-22 NOTE — Telephone Encounter (Signed)
CPT [id number] does not require PA. Requesting 155 units every 12 weeks for dx G43.709. Faxed records to [phone]. Pending reference #[id number].

## 2021-10-31 ENCOUNTER — Other Ambulatory Visit: Payer: Self-pay

## 2021-10-31 ENCOUNTER — Ambulatory Visit (INDEPENDENT_AMBULATORY_CARE_PROVIDER_SITE_OTHER): Payer: 59 | Admitting: Licensed Clinical Social Worker

## 2021-10-31 DIAGNOSIS — F411 Generalized anxiety disorder: Secondary | ICD-10-CM

## 2021-10-31 DIAGNOSIS — F3342 Major depressive disorder, recurrent, in full remission: Secondary | ICD-10-CM | POA: Diagnosis not present

## 2021-11-02 NOTE — Plan of Care (Signed)
°  Problem: Anxiety Disorder CCP Problem  1 Reduce overall frequency, intensity, and duration of the anxiety so that daily functioning is not impaired.  Goal: LTG: Patient will score less than 5 on the Generalized Anxiety Disorder 7 Scale (GAD-7) Outcome: Progressing Goal: STG: Patient will participate in at least 80% of scheduled individual psychotherapy sessions Outcome: Progressing Intervention: Discuss self-management skills Intervention: Encourage verbalization of feelings/concerns/expectations Intervention: Encourage self-care activities Intervention: Monitor coping skills and behavior

## 2021-11-02 NOTE — Progress Notes (Signed)
° °  THERAPIST PROGRESS NOTE  Session Time: 4-445p  Participation Level: Active  Behavioral Response: Neat and Well GroomedAlertAnxious  Type of Therapy: Individual Therapy  Treatment Goals addressed: Problem: Anxiety Disorder CCP Problem  1 Reduce overall frequency, intensity, and duration of the anxiety so that daily functioning is not impaired.  Goal: LTG: Patient will score less than 5 on the Generalized Anxiety Disorder 7 Scale (GAD-7) Outcome: Progressing Goal: STG: Patient will participate in at least 80% of scheduled individual psychotherapy sessions Outcome: Progressing  ProgressTowards Goals: Progressing  Interventions: Solution Focused and Supportive  Summary: Candice [name] is a 36 y.o. female who presents with improving symptoms related to depression. Pt reports some situational anxiety and feels that she is using coping skills to manage anxiety. Pt reports that she had covid recently and has recovered well from that.  Pt reports occasional insomnia but is managing.   Allowed pt to explore and express thoughts and feelings associated with recent life situations and external stressors. Pt reports that husband and his brother are still estranged and have been for months now. Pt feels that relationship between herself and husband are stable at time of session. Pt and husband are socializing regularly--going out with friends, having date nights, trivia/karaoke,etc.  Pt reports that workplace stress is continuing due to being short staffed at times. Continued to encourage pt to focus on balance and self care to manage stress.   Continued recommendations are as follows: self care behaviors, positive social engagements, focusing on overall work/home/life balance, and focusing on positive physical and emotional wellness.    Suicidal/Homicidal: No  Therapist Response: Pt is continuing to apply interventions learned in session into daily life situations. Pt is currently on track to  meet goals utilizing interventions mentioned above. Personal growth and progress noted. Treatment to continue as indicated.   Plan: Return again in 4 weeks.  Diagnosis: MDD (major depressive disorder), recurrent, in full remission (HCC)  Anxiety state  Collaboration of Care: Other Pt encouraged to continue psychiatric care with Dr. Elna Breslow  Patient/Guardian was advised Release of Information must be obtained prior to any record release in order to collaborate their care with an outside provider. Patient/Guardian was advised if they have not already done so to contact the registration department to sign all necessary forms in order for [place] to release information regarding their care.   Consent: Patient/Guardian gives verbal consent for treatment and assignment of benefits for services provided during this visit. Patient/Guardian expressed understanding and agreed to proceed.   Ernest Haber Rickie Gange, LCSW 11/02/2021

## 2021-11-12 ENCOUNTER — Other Ambulatory Visit: Payer: Self-pay

## 2021-11-12 ENCOUNTER — Ambulatory Visit: Payer: 59 | Admitting: Adult Health

## 2021-11-12 DIAGNOSIS — G43709 Chronic migraine without aura, not intractable, without status migrainosus: Secondary | ICD-10-CM | POA: Diagnosis not present

## 2021-11-12 MED ORDER — NURTEC 75 MG PO TBDP
1.0000 | ORAL_TABLET | Freq: Every day | ORAL | 2 refills | Status: DC | PRN
  Filled 2021-11-12: qty 8, 15d supply, fill #0
  Filled 2021-11-18: qty 8, 30d supply, fill #0
  Filled 2022-01-07: qty 8, 30d supply, fill #1
  Filled 2022-01-30: qty 8, 30d supply, fill #2
  Filled 2022-03-11: qty 6, 23d supply, fill #3
  Filled 2022-06-01: qty 8, 30d supply, fill #3

## 2021-11-12 MED ORDER — ONDANSETRON 8 MG PO TBDP
8.0000 mg | ORAL_TABLET | Freq: Three times a day (TID) | ORAL | 2 refills | Status: DC | PRN
  Filled 2021-11-12: qty 20, 7d supply, fill #0
  Filled 2021-12-23: qty 20, 7d supply, fill #1
  Filled 2022-01-07: qty 20, 7d supply, fill #2

## 2021-11-12 NOTE — Progress Notes (Signed)
? ? ?11/12/20: Patient states that Botox is working well for her.  She states the week before her Botox is due she tends to have more headaches.  She is using the Ubrelvy and sometimes nurtrec. Advised that she could try taking nurtec daily 5 days before her cycle to try to prevent migraines.  ? ? ?BOTOX PROCEDURE NOTE FOR MIGRAINE HEADACHE ? ? ? ?Contraindications and precautions discussed with patient(above). Aseptic procedure was observed and patient tolerated procedure. Procedure performed by Butch Penny, NP ? ?The condition has existed for more than 6 months, and pt does not have a diagnosis of ALS, Myasthenia Gravis or Lambert-Eaton Syndrome.  Risks and benefits of injections discussed and pt agrees to proceed with the procedure.  Written consent obtained ? ?These injections are medically necessary. These injections do not cause sedations or hallucinations which the oral therapies may cause. ? ?Indication/Diagnosis: chronic migraine ?YBWLS(L3734) injection was performed according to protocol by [employer]. 200 units of BOTOX was dissolved into 4 cc NS.   ?NDC: [id number] ? ?Type of toxin: Botox ? ?Botox- 200 units x 1 vial ?Lot: [id number] ?Expiration: 05/2024 ?NDC: [phone] ?  ?Bacteriostatic 0.9% Sodium Chloride- 49mL total ?Lot: [id number] ?Expiration: 04/09/2023 ?NDC: 2482-5003-70 ?  ?Dx: W88.891 ? ?Description of procedure: ? ?The patient was placed in a sitting position. The standard protocol was used for Botox as follows, with 5 units of Botox injected at each site: ? ? ?-Procerus muscle, midline injection ? ?-Corrugator muscle, bilateral injection ? ?-Frontalis muscle, bilateral injection, with 2 sites each side, medial injection was performed in the upper one third of the frontalis muscle, in the region vertical from the medial inferior edge of the superior orbital rim. The lateral injection was again in the upper one third of the forehead vertically above the lateral limbus of the cornea, 1.5 cm  lateral to the medial injection site. ? ?-Temporalis muscle injection, 4 sites, bilaterally. The first injection was 3 cm above the tragus of the ear, second injection site was 1.5 cm to 3 cm up from the first injection site in line with the tragus of the ear. The third injection site was 1.5-3 cm forward between the first 2 injection sites. The fourth injection site was 1.5 cm posterior to the second injection site. ? ?-Occipitalis muscle injection, 3 sites, bilaterally. The first injection was done one half way between the occipital protuberance and the tip of the mastoid process behind the ear. The second injection site was done lateral and superior to the first, 1 fingerbreadth from the first injection. The third injection site was 1 fingerbreadth superiorly and medially from the first injection site. ? ?-Cervical paraspinal muscle injection, 2 sites, bilateral knee first injection site was 1 cm from the midline of the cervical spine, 3 cm inferior to the lower border of the occipital protuberance. The second injection site was 1.5 cm superiorly and laterally to the first injection site. ? ?-Trapezius muscle injection was performed at 3 sites, bilaterally. The first injection site was in the upper trapezius muscle halfway between the inflection point of the neck, and the acromion. The second injection site was one half way between the acromion and the first injection site. The third injection was done between the first injection site and the inflection point of the neck. ? ? ?Will return for repeat injection in 3 months. ? ? ?A 200 unitsof Botox was used, 155 units were injected, the rest of the Botox was wasted. The patient tolerated the procedure well,  there were no complications of the above procedure. ? ?Butch Penny, MSN, NP-C 11/12/2021, 3:05 PM ?[facility] ?[address], [address] ?[place], [place] [address] ?[phone] ? ?

## 2021-11-12 NOTE — Progress Notes (Signed)
Botox- 200 units x 1 vial ?Lot: [id number] ?Expiration: 05/2024 ?NDC: [phone] ? ?Bacteriostatic 0.9% Sodium Chloride- 29mL total ?Lot: [id number] ?Expiration: 04/09/2023 ?NDC: [phone] ? ?Dx: E56.314 ? ?B/B ? ?

## 2021-11-14 ENCOUNTER — Telehealth: Payer: Self-pay | Admitting: *Deleted

## 2021-11-14 NOTE — Telephone Encounter (Signed)
Approved today ?The request has been approved. The authorization is effective for a maximum of 6 fills from 11/14/2021 to 05/16/2022, as long as the member is enrolled in their current health plan. The request was approved with a quantity restriction. This has been approved for a max daily dosage of 0. A written notification letter will follow with additional details. ?

## 2021-11-14 NOTE — Telephone Encounter (Signed)
Completed Nurtec PA on Cover My Meds. Key: [id number]. Awaiting determination from [employer].  ?

## 2021-11-18 ENCOUNTER — Other Ambulatory Visit: Payer: Self-pay

## 2021-11-19 ENCOUNTER — Other Ambulatory Visit: Payer: Self-pay

## 2021-11-20 ENCOUNTER — Other Ambulatory Visit: Payer: Self-pay

## 2021-11-20 ENCOUNTER — Ambulatory Visit (INDEPENDENT_AMBULATORY_CARE_PROVIDER_SITE_OTHER): Payer: 59 | Admitting: Psychiatry

## 2021-11-20 ENCOUNTER — Encounter: Payer: Self-pay | Admitting: Psychiatry

## 2021-11-20 VITALS — BP 126/86 | HR 111 | Temp 98.8°F | Wt 163.0 lb

## 2021-11-20 DIAGNOSIS — F5105 Insomnia due to other mental disorder: Secondary | ICD-10-CM

## 2021-11-20 DIAGNOSIS — F3342 Major depressive disorder, recurrent, in full remission: Secondary | ICD-10-CM

## 2021-11-20 DIAGNOSIS — F418 Other specified anxiety disorders: Secondary | ICD-10-CM

## 2021-11-20 MED ORDER — ALPRAZOLAM 0.25 MG PO TABS
0.2500 mg | ORAL_TABLET | Freq: Every evening | ORAL | 0 refills | Status: DC | PRN
  Filled 2021-11-20: qty 15, 15d supply, fill #0

## 2021-11-20 MED ORDER — BUPROPION HCL ER (SR) 150 MG PO TB12
150.0000 mg | ORAL_TABLET | Freq: Two times a day (BID) | ORAL | 0 refills | Status: DC
  Filled 2021-11-20: qty 180, 90d supply, fill #0

## 2021-11-20 MED ORDER — ESCITALOPRAM OXALATE 20 MG PO TABS
ORAL_TABLET | Freq: Every day | ORAL | 0 refills | Status: DC
  Filled 2021-11-20: qty 90, 90d supply, fill #0

## 2021-11-20 NOTE — Patient Instructions (Signed)

## 2021-11-20 NOTE — Progress Notes (Signed)
[facility] MD OP Progress Note ? ?11/20/2021 5:12 PM ?Candice [name]  ?MRN:  [id number] ? ?Chief Complaint:  ?Chief Complaint  ?Patient presents with  ? Follow-up: 36 year old Caucasian female with history of MDD, panic attacks, insomnia, was evaluated for medication management.  ? ?HPI: Candice [name] is a 35 year old Caucasian female, employed, lives in [place], has a history of MDD, panic attacks, insomnia, bilateral hearing loss, migraine headache, was evaluated in office today. ? ?Patient today reports she has been struggling with sleep since the past few weeks.  She reports she had COVID-19 infection beginning of February.  She reports the night before she tested positive she had severe sleep problems.  She reports she was planning on Field seismologist to discuss this however the next day she tested positive and realized that may have caused her sleep issues.  Patient reports even though she has recovered from COVID-19 infection she continues to have sleep problems.  She has vivid dreams at night which sometimes wakes her up.  She reports sleep hence is interrupted although she is able to fall back asleep.  Patient does report she has increased her alcohol intake recently.  Currently drinks 4-5 times a week, a glass of wine with her dinner.  That likely could be contributing to her sleep issues as well.  She is currently not on any sleep medication.  Agreeable to work on sleep hygiene as well as try melatonin over-the-counter. ? ?Patient reports mild anxiety symptoms although it is manageable at this time.  Currently compliant on Lexapro.  Uses the Xanax as needed only.  Currently limiting use. ? ?Reports her relationship with her husband is going well.  She reports the sexual side effects have improved since being on the Wellbutrin. ? ?Patient denies any suicidality, homicidality or perceptual disturbances. ? ?Patient denies any other concerns today. ? ?Visit Diagnosis:  ?  ICD-10-CM   ?1. MDD (major depressive  disorder), recurrent, in full remission (HCC)  F33.42 buPROPion (WELLBUTRIN SR) 150 MG 12 hr tablet  ?  escitalopram (LEXAPRO) 20 MG tablet  ?  ?2. Other specified anxiety disorders  F41.8 ALPRAZolam (XANAX) 0.25 MG tablet  ? with limited symptom attacks  ?  ?3. Insomnia due to mental condition  F51.05   ? mood  ?  ? ? ?Past Psychiatric History: Reviewed past psychiatric history from progress note on 01/10/2019. ? ?Past Medical History:  ?Past Medical History:  ?Diagnosis Date  ? Anxiety   ? Depression   ? Hearing loss   ? Migraines   ? a. x 10+ years.  ? Postpartum care following cesarean delivery (5/29) 02/04/2014  ? Pre-syncope   ? a. 07/2015 [facility] ED visit, ? vasovagal, improved with IVF.  ? Short cervix in second trimester, antepartum 11/16/2013  ?  ?Past Surgical History:  ?Procedure Laterality Date  ? CESAREAN SECTION N/A 02/03/2014  ? Procedure: Primary Cesarean Section Delivery Baby Girl @ 2244, Apgars 9/9;  Surgeon: Serita Kyle, MD;  Location: [facility];  Service: Obstetrics;  Laterality: N/A;  ? CESAREAN SECTION    ? WISDOM TOOTH EXTRACTION  2002  ? ? ?Family Psychiatric History: Reviewed family psychiatric history from progress note on 01/10/2019. ? ?Family History:  ?Family History  ?Problem Relation Age of Onset  ? Depression Mother   ? Anxiety disorder Mother   ? Heart attack Mother   ?     NSTEMI @ age 57.  ? Migraines Mother   ? Sleep apnea Father   ?  alive @ 3056.  ? Anxiety disorder Maternal Grandmother   ? Depression Maternal Grandmother   ? Diabetes Maternal Grandmother   ? Hypertension Maternal Grandmother   ? Heart attack Maternal Grandfather   ? Alcohol abuse Paternal Grandfather   ? Suicidality Paternal Grandfather   ? ? ?Social History: Reviewed social history from progress note on 01/10/2019. ?Social History  ? ?Socioeconomic History  ? Marital status: Married  ?  Spouse name: Not on file  ? Number of children: 1  ? Years of education: Not on file  ? Highest education level: Not on file   ?Occupational History  ? Not on file  ?Tobacco Use  ? Smoking status: Never  ? Smokeless tobacco: Never  ?Vaping Use  ? Vaping Use: Never used  ?Substance and Sexual Activity  ? Alcohol use: Yes  ?  Alcohol/week: 0.0 standard drinks  ?  Comment: maybe two drinks/month.  ? Drug use: No  ? Sexual activity: Yes  ?  Birth control/protection: Pill  ?Other Topics Concern  ? Not on file  ?Social History Narrative  ? Lives in [place] with her husband and 5317 month old dtr.  Does not routinely exercise.  ?    ? ?Social Determinants of Health  ? ?Financial Resource Strain: Not on file  ?Food Insecurity: Not on file  ?Transportation Needs: Not on file  ?Physical Activity: Not on file  ?Stress: Not on file  ?Social Connections: Not on file  ? ? ?Allergies:  ?Allergies  ?Allergen Reactions  ? Elemental Sulfur Hives  ? Sulfa Antibiotics Other (See Comments) and Hives  ? ? ?Metabolic Disorder Labs: ?No results found for: HGBA1C, MPG ?No results found for: PROLACTIN ?No results found for: CHOL, TRIG, HDL, CHOLHDL, VLDL, LDLCALC ?Lab Results  ?Component Value Date  ? TSH 0.696 07/12/2015  ? ? ?Therapeutic Level Labs: ?No results found for: LITHIUM ?No results found for: VALPROATE ?No components found for:  CBMZ ? ?Current Medications: ?Current Outpatient Medications  ?Medication Sig Dispense Refill  ? cyanocobalamin (,VITAMIN B-12,) 1000 MCG/ML injection Inject 1 mL (1,000 mcg total) into the muscle monthly for 4 doses 1 mL 3  ? eletriptan (RELPAX) 40 MG tablet TAKE 1 TABLET BY MOUTH AS NEEDED FOR MIGRAINE OR HEADACHE. MAY REPEAT IN 2 HOURS IF HEADACHE PERSISTS OR RECURS. 10 tablet 11  ? levonorgestrel-ethinyl estradiol (NORDETTE) 0.15-30 MG-MCG tablet Take 1 tablet by mouth once daily. Take active pills ONLY 112 tablet 1  ? ondansetron (ZOFRAN) 8 MG tablet TAKE 1 TABLET BY MOUTH EVERY 8 HOURS AS NEEDED. 20 tablet 0  ? ondansetron (ZOFRAN-ODT) 8 MG disintegrating tablet Take 1 tablet (8 mg total) by mouth every 8 (eight) hours as  needed for Nausea or Vomiting 20 tablet 2  ? promethazine (PHENERGAN) 25 MG tablet TAKE 1 TABLET BY MOUTH EVERY 8 (EIGHT) HOURS AS NEEDED FOR NAUSEA 30 tablet 0  ? propranolol ER (INDERAL LA) 60 MG 24 hr capsule Take 1 capsule (60 mg total) by mouth at bedtime. 30 capsule 6  ? Rimegepant Sulfate (NURTEC) 75 MG TBDP Take 1 tablet by mouth daily as needed. 10 tablet 2  ? Tuberculin-Allergy Syringes (VANISHPOINT TUBERCULIN SYRINGE) 25G X 1" 1 ML MISC Use as directed for up to 30 days 1 each 3  ? Ubrogepant 100 MG TABS TAKE 1 TABLET BY MOUTH AS NEEDED. 16 tablet 11  ? ALPRAZolam (XANAX) 0.25 MG tablet Take 1 tablet (0.25 mg total) by mouth at bedtime as needed for anxiety. 15  tablet 0  ? buPROPion (WELLBUTRIN SR) 150 MG 12 hr tablet Take 1 tablet (150 mg total) by mouth 2 (two) times daily. Take daily at 8:30 AM and at 2 PM 180 tablet 0  ? escitalopram (LEXAPRO) 20 MG tablet TAKE 1 TABLET BY MOUTH DAILY. 90 tablet 0  ? ?No current facility-administered medications for this visit.  ? ? ? ?Musculoskeletal: ?Strength & Muscle Tone: within normal limits ?Gait & Station: normal ?Patient leans: N/A ? ?Psychiatric Specialty Exam: ?Review of Systems  ?Psychiatric/Behavioral:  Positive for sleep disturbance.   ?All other systems reviewed and are negative.  ?Blood pressure 126/86, pulse (!) 111, temperature 98.8 ?F (37.1 ?C), temperature source Temporal, weight 163 lb (73.9 kg).Body mass index is 27.98 kg/m?.  ?General Appearance: Casual  ?Eye Contact:  Fair  ?Speech:  Clear and Coherent  ?Volume:  Normal  ?Mood:  Euthymic  ?Affect:  Congruent  ?Thought Process:  Goal Directed and Descriptions of Associations: Intact  ?Orientation:  Full (Time, Place, and Person)  ?Thought Content: Logical   ?Suicidal Thoughts:  No  ?Homicidal Thoughts:  No  ?Memory:  Immediate;   Fair ?Recent;   Fair ?Remote;   Fair  ?Judgement:  Fair  ?Insight:  Fair  ?Psychomotor Activity:  Normal  ?Concentration:  Concentration: Fair and Attention Span: Fair   ?Recall:  Fair  ?Fund of Knowledge: Fair  ?Language: Fair  ?Akathisia:  No  ?Handed:  Right  ?AIMS (if indicated): not done  ?Assets:  Communication Skills ?Desire for Improvement ?Housing ?Social Support

## 2021-12-13 ENCOUNTER — Telehealth: Payer: 59 | Admitting: Physician Assistant

## 2021-12-13 ENCOUNTER — Other Ambulatory Visit: Payer: Self-pay

## 2021-12-13 ENCOUNTER — Telehealth: Payer: 59 | Admitting: Emergency Medicine

## 2021-12-13 DIAGNOSIS — G43909 Migraine, unspecified, not intractable, without status migrainosus: Secondary | ICD-10-CM | POA: Diagnosis not present

## 2021-12-13 DIAGNOSIS — Z760 Encounter for issue of repeat prescription: Secondary | ICD-10-CM

## 2021-12-13 MED ORDER — LEVONORGESTREL-ETHINYL ESTRAD 0.15-30 MG-MCG PO TABS
ORAL_TABLET | ORAL | 1 refills | Status: DC
  Filled 2021-12-13: qty 112, 84d supply, fill #0

## 2021-12-13 NOTE — Patient Instructions (Signed)
?Candice [name], thank you for joining Candice Parsons, NP for today's virtual visit.  While this provider is not your primary care provider (PCP), if your PCP is located in our provider database this encounter information will be shared with them immediately following your visit. ? ?Consent: ?(Patient) Candice [name] provided verbal consent for this virtual visit at the beginning of the encounter. ? ?Current Medications: ? ?Current Outpatient Medications:  ?  ALPRAZolam (XANAX) 0.25 MG tablet, Take 1 tablet (0.25 mg total) by mouth at bedtime as needed for anxiety., Disp: 15 tablet, Rfl: 0 ?  buPROPion (WELLBUTRIN SR) 150 MG 12 hr tablet, Take 1 tablet (150 mg total) by mouth 2 (two) times daily. Take daily at 8:30 AM and at 2 PM, Disp: 180 tablet, Rfl: 0 ?  cyanocobalamin (,VITAMIN B-12,) 1000 MCG/ML injection, Inject 1 mL (1,000 mcg total) into the muscle monthly for 4 doses, Disp: 1 mL, Rfl: 3 ?  eletriptan (RELPAX) 40 MG tablet, TAKE 1 TABLET BY MOUTH AS NEEDED FOR MIGRAINE OR HEADACHE. MAY REPEAT IN 2 HOURS IF HEADACHE PERSISTS OR RECURS., Disp: 10 tablet, Rfl: 11 ?  escitalopram (LEXAPRO) 20 MG tablet, TAKE 1 TABLET BY MOUTH DAILY., Disp: 90 tablet, Rfl: 0 ?  levonorgestrel-ethinyl estradiol (NORDETTE) 0.15-30 MG-MCG tablet, Take 1 tablet by mouth once daily. Take active pills ONLY, Disp: 112 tablet, Rfl: 1 ?  ondansetron (ZOFRAN) 8 MG tablet, TAKE 1 TABLET BY MOUTH EVERY 8 HOURS AS NEEDED., Disp: 20 tablet, Rfl: 0 ?  ondansetron (ZOFRAN-ODT) 8 MG disintegrating tablet, Take 1 tablet (8 mg total) by mouth every 8 (eight) hours as needed for Nausea or Vomiting, Disp: 20 tablet, Rfl: 2 ?  promethazine (PHENERGAN) 25 MG tablet, TAKE 1 TABLET BY MOUTH EVERY 8 (EIGHT) HOURS AS NEEDED FOR NAUSEA, Disp: 30 tablet, Rfl: 0 ?  propranolol ER (INDERAL LA) 60 MG 24 hr capsule, Take 1 capsule (60 mg total) by mouth at bedtime., Disp: 30 capsule, Rfl: 6 ?  Rimegepant Sulfate (NURTEC) 75 MG TBDP, Take 1 tablet by  mouth daily as needed., Disp: 10 tablet, Rfl: 2 ?  Tuberculin-Allergy Syringes (VANISHPOINT TUBERCULIN SYRINGE) 25G X 1" 1 ML MISC, Use as directed for up to 30 days, Disp: 1 each, Rfl: 3 ?  Ubrogepant 100 MG TABS, TAKE 1 TABLET BY MOUTH AS NEEDED., Disp: 16 tablet, Rfl: 11  ? ?Medications ordered in this encounter:  ?Meds ordered this encounter  ?Medications  ? levonorgestrel-ethinyl estradiol (NORDETTE) 0.15-30 MG-MCG tablet  ?  Sig: Take 1 tablet by mouth once daily. Take active pills ONLY  ?  Dispense:  112 tablet  ?  Refill:  1  ?  ? ?*If you need refills on other medications prior to your next appointment, please contact your pharmacy* ? ?Follow-Up: ?Call back or seek an in-person evaluation if the symptoms worsen or if the condition fails to improve as anticipated. ? ? ? ?If you have been instructed to have an in-person evaluation today at a local Urgent Care facility, please use the link below. It will take you to a list of all of our available [facility], including address, phone number and hours of operation. Please do not delay care.  ?[facility] ? ?If you or a family member do not have a primary care provider, use the link below to schedule a visit and establish care. When you choose a [facility] primary care physician or advanced practice provider, you gain a long-term  partner in health. ?Find a Primary Care Provider ? ?Learn more about [facility] in-office and virtual care options: ?[facility] - Get Care Now  ?

## 2021-12-13 NOTE — Progress Notes (Signed)
Based on the information that you have shared in the e-Visit Questionnaire, we recommend that you convert this visit to a video visit in order for the provider to better assess what is going on.  The provider will be able to give you a more accurate diagnosis and treatment plan if we can more freely discuss your symptoms and with the addition of a virtual examination.  ? ?Please schedule a video visit for medication refills. ? ?If you convert to a video visit, we will bill your insurance (similar to an office visit) and you will not be charged for this e-Visit. You will be able to stay at home and speak with the first available [facility] advanced practice provider. The link to do a video visit is in the drop down Menu tab of your Welcome screen in [place]. ? ?I provided 5 minutes of non face-to-face time during this encounter for chart review and documentation.  ? ?

## 2021-12-13 NOTE — Progress Notes (Signed)
?Virtual Visit Consent  ? ?[name], you are scheduled for a virtual visit with a [name] provider today.   ?  ?Just as with appointments in the office, your consent must be obtained to participate.  Your consent will be active for this visit and any virtual visit you may have with one of our providers in the next 365 days.   ?  ?If you have a MyChart account, a copy of this consent can be sent to you electronically.  All virtual visits are billed to your insurance company just like a traditional visit in the office.   ? ?As this is a virtual visit, video technology does not allow for your provider to perform a traditional examination.  This may limit your provider's ability to fully assess your condition.  If your provider identifies any concerns that need to be evaluated in person or the need to arrange testing (such as labs, EKG, etc.), we will make arrangements to do so.   ?  ?Although advances in technology are sophisticated, we cannot ensure that it will always work on either your end or our end.  If the connection with a video visit is poor, the visit may have to be switched to a telephone visit.  With either a video or telephone visit, we are not always able to ensure that we have a secure connection.    ? ?I need to obtain your verbal consent now.   Are you willing to proceed with your visit today?  ?  ?Candice [name] has provided verbal consent on 12/13/2021 for a virtual visit (video or telephone). ?  ?Candice Parsons, NP  ? ?Date: 12/13/2021 1:33 PM ? ? ?Virtual Visit via Video Note  ? ?Candice [name], connected with  Candice [name]  ([id number], 1986/08/02) on 12/13/21 at  1:30 PM EDT by a video-enabled telemedicine application and verified that I am speaking with the correct person using two identifiers. ? ?Location: ?Patient: Virtual Visit Location Patient: Home ?Provider: Virtual Visit Location Provider: Home Office ?  ?I discussed the limitations of evaluation and management by  telemedicine and the availability of in person appointments. The patient expressed understanding and agreed to proceed.   ? ?History of Present Illness: ?Candice [name] is a 36 y.o. who identifies as a female who was assigned female at birth, and is being seen today for medication refill. Pt reports she takes continuous ocps for migraines and will run out of pills tomorrow. Today is a holiday and her PCP's office is closed, she is unable to get refill. She is also heading out of town tomorrow for spring break. Requests refill of ocps. Denies any other concerns or symptoms.  ? ?HPI: HPI  ?Problems:  ?Patient Active Problem List  ? Diagnosis Date Noted  ? MDD (major depressive disorder), recurrent episode, mild (HCC) 10/30/2020  ? Insomnia due to mental condition 07/14/2019  ? Anxiety 05/13/2019  ? MDD (major depressive disorder), recurrent, in full remission (HCC) 04/08/2019  ? Panic attacks 04/08/2019  ? MDD (major depressive disorder), recurrent, in partial remission (HCC) 04/08/2019  ? Stress at home 12/19/2016  ? Muscle spasm 06/06/2016  ? Migraine without aura and with status migrainosus, not intractable 11/23/2015  ? Migraine without aura and without status migrainosus, not intractable 09/11/2015  ? Chronic migraine without aura without status migrainosus, not intractable 09/11/2015  ? Tachycardia 08/27/2015  ? Depression, major, recurrent, moderate (HCC) 02/19/2015  ? Postpartum care following cesarean delivery (  5/29) 02/04/2014  ? Indication for care in labor or delivery 02/03/2014  ? Short cervix in second trimester, antepartum 11/16/2013  ? Antepartum bleeding, second trimester 11/15/2013  ?  ?Allergies:  ?Allergies  ?Allergen Reactions  ? Elemental Sulfur Hives  ? Sulfa Antibiotics Other (See Comments) and Hives  ? ?Medications:  ?Current Outpatient Medications:  ?  ALPRAZolam (XANAX) 0.25 MG tablet, Take 1 tablet (0.25 mg total) by mouth at bedtime as needed for anxiety., Disp: 15 tablet, Rfl: 0 ?   buPROPion (WELLBUTRIN SR) 150 MG 12 hr tablet, Take 1 tablet (150 mg total) by mouth 2 (two) times daily. Take daily at 8:30 AM and at 2 PM, Disp: 180 tablet, Rfl: 0 ?  cyanocobalamin (,VITAMIN B-12,) 1000 MCG/ML injection, Inject 1 mL (1,000 mcg total) into the muscle monthly for 4 doses, Disp: 1 mL, Rfl: 3 ?  eletriptan (RELPAX) 40 MG tablet, TAKE 1 TABLET BY MOUTH AS NEEDED FOR MIGRAINE OR HEADACHE. MAY REPEAT IN 2 HOURS IF HEADACHE PERSISTS OR RECURS., Disp: 10 tablet, Rfl: 11 ?  escitalopram (LEXAPRO) 20 MG tablet, TAKE 1 TABLET BY MOUTH DAILY., Disp: 90 tablet, Rfl: 0 ?  levonorgestrel-ethinyl estradiol (NORDETTE) 0.15-30 MG-MCG tablet, Take 1 tablet by mouth once daily. Take active pills ONLY, Disp: 112 tablet, Rfl: 1 ?  ondansetron (ZOFRAN) 8 MG tablet, TAKE 1 TABLET BY MOUTH EVERY 8 HOURS AS NEEDED., Disp: 20 tablet, Rfl: 0 ?  ondansetron (ZOFRAN-ODT) 8 MG disintegrating tablet, Take 1 tablet (8 mg total) by mouth every 8 (eight) hours as needed for Nausea or Vomiting, Disp: 20 tablet, Rfl: 2 ?  promethazine (PHENERGAN) 25 MG tablet, TAKE 1 TABLET BY MOUTH EVERY 8 (EIGHT) HOURS AS NEEDED FOR NAUSEA, Disp: 30 tablet, Rfl: 0 ?  propranolol ER (INDERAL LA) 60 MG 24 hr capsule, Take 1 capsule (60 mg total) by mouth at bedtime., Disp: 30 capsule, Rfl: 6 ?  Rimegepant Sulfate (NURTEC) 75 MG TBDP, Take 1 tablet by mouth daily as needed., Disp: 10 tablet, Rfl: 2 ?  Tuberculin-Allergy Syringes (VANISHPOINT TUBERCULIN SYRINGE) 25G X 1" 1 ML MISC, Use as directed for up to 30 days, Disp: 1 each, Rfl: 3 ?  Ubrogepant 100 MG TABS, TAKE 1 TABLET BY MOUTH AS NEEDED., Disp: 16 tablet, Rfl: 11 ? ?Observations/Objective: ?Patient is well-developed, well-nourished in no acute distress.  ?Resting comfortably  at home.  ?Head is normocephalic, atraumatic.  ?No labored breathing.  ?Speech is clear and coherent with logical content.  ?Patient is alert and oriented at baseline.  ? ? ?Assessment and Plan: ?1. Migraine without  status migrainosus, not intractable, unspecified migraine type ? ?Rx for levonorgestrel-ethinyl estradiol (NORDETTE) 0.15-30 MG-MCG tablet .  Submitted. Pt to f/u with pcp for further care.  ? ?Follow Up Instructions: ?I discussed the assessment and treatment plan with the patient. The patient was provided an opportunity to ask questions and all were answered. The patient agreed with the plan and demonstrated an understanding of the instructions.  A copy of instructions were sent to the patient via MyChart unless otherwise noted below.  ? ?The patient was advised to call back or seek an in-person evaluation if the symptoms worsen or if the condition fails to improve as anticipated. ? ?Time:  ?I spent 5 minutes with the patient via telehealth technology discussing the above problems/concerns.   ? ?Candice Parsons, NP ?

## 2021-12-16 ENCOUNTER — Other Ambulatory Visit: Payer: Self-pay

## 2021-12-16 MED ORDER — LEVONORGESTREL-ETHINYL ESTRAD 0.15-30 MG-MCG PO TABS
ORAL_TABLET | ORAL | 1 refills | Status: DC
  Filled 2022-03-11: qty 112, 84d supply, fill #0
  Filled 2022-06-01: qty 112, 84d supply, fill #1

## 2021-12-23 ENCOUNTER — Other Ambulatory Visit: Payer: Self-pay

## 2022-01-01 ENCOUNTER — Ambulatory Visit: Payer: 59 | Admitting: Licensed Clinical Social Worker

## 2022-01-06 DIAGNOSIS — R42 Dizziness and giddiness: Secondary | ICD-10-CM | POA: Diagnosis not present

## 2022-01-07 ENCOUNTER — Other Ambulatory Visit: Payer: Self-pay

## 2022-01-30 ENCOUNTER — Other Ambulatory Visit: Payer: Self-pay

## 2022-01-30 ENCOUNTER — Other Ambulatory Visit: Payer: Self-pay | Admitting: Adult Health

## 2022-01-30 MED ORDER — ONDANSETRON 8 MG PO TBDP
8.0000 mg | ORAL_TABLET | Freq: Three times a day (TID) | ORAL | 2 refills | Status: DC | PRN
  Filled 2022-01-30: qty 20, 7d supply, fill #0
  Filled 2022-03-11: qty 20, 7d supply, fill #1
  Filled 2022-05-13: qty 20, 7d supply, fill #2

## 2022-01-31 ENCOUNTER — Other Ambulatory Visit: Payer: Self-pay

## 2022-02-06 ENCOUNTER — Ambulatory Visit (INDEPENDENT_AMBULATORY_CARE_PROVIDER_SITE_OTHER): Payer: 59 | Admitting: Adult Health

## 2022-02-06 DIAGNOSIS — G43709 Chronic migraine without aura, not intractable, without status migrainosus: Secondary | ICD-10-CM | POA: Diagnosis not present

## 2022-02-06 NOTE — Progress Notes (Signed)
Botox consent signed  Botox- 100 units x 2 vials Lot: [id number] Expiration: 08/2024 NDC: 9758-8325-49  Bacteriostatic 0.9% Sodium Chloride- 96mL total Lot: [id number] Expiration: 04/09/2023 NDC: 8309-4076-80  Dx: S81.103 B/B

## 2022-02-06 NOTE — Progress Notes (Signed)
02/06/22: Tried nurtec daily for the last week but not sure it helped? Maybe knocked the edge off. Otherwise her headaches are controlled. Has a mild headache 1-2 times a week. Usually will try OTC medicine first then will use ubrelvy. Encouraged her to use the ubrelvy first thing.  11/12/20: Patient states that Botox is working well for her.  She states the week before her Botox is due she tends to have more headaches.  She is using the Ubrelvy and sometimes nurtrec. Advised that she could try taking nurtec daily 5 days before her cycle to try to prevent migraines.    BOTOX PROCEDURE NOTE FOR MIGRAINE HEADACHE    Contraindications and precautions discussed with patient(above). Aseptic procedure was observed and patient tolerated procedure. Procedure performed by Ward Givens, NP  The condition has existed for more than 6 months, and pt does not have a diagnosis of ALS, Myasthenia Gravis or Lambert-Eaton Syndrome.  Risks and benefits of injections discussed and pt agrees to proceed with the procedure.  Written consent obtained  These injections are medically necessary. These injections do not cause sedations or hallucinations which the oral therapies may cause.  Indication/Diagnosis: chronic migraine BOTOX(J0585) injection was performed according to protocol by [employer]. 200 units of BOTOX was dissolved into 4 cc NS.   NDC: [id number]  Type of toxin: Botox    Botox- 100 units x 2 vials Lot: [id number] Expiration: 08/2024 NDC: [id number]   Bacteriostatic 0.9% Sodium Chloride- 77mL total Lot: [id number] Expiration: 04/09/2023 NDC: [id number]   Dx: [id number]  Description of procedure:  The patient was placed in a sitting position. The standard protocol was used for Botox as follows, with 5 units of Botox injected at each site:   -Procerus muscle, midline injection  -Corrugator muscle, bilateral injection  -Frontalis muscle, bilateral injection, with 2 sites each side, medial  injection was performed in the upper one third of the frontalis muscle, in the region vertical from the medial inferior edge of the superior orbital rim. The lateral injection was again in the upper one third of the forehead vertically above the lateral limbus of the cornea, 1.5 cm lateral to the medial injection site.  -Temporalis muscle injection, 4 sites, bilaterally. The first injection was 3 cm above the tragus of the ear, second injection site was 1.5 cm to 3 cm up from the first injection site in line with the tragus of the ear. The third injection site was 1.5-3 cm forward between the first 2 injection sites. The fourth injection site was 1.5 cm posterior to the second injection site.  -Occipitalis muscle injection, 3 sites, bilaterally. The first injection was done one half way between the occipital protuberance and the tip of the mastoid process behind the ear. The second injection site was done lateral and superior to the first, 1 fingerbreadth from the first injection. The third injection site was 1 fingerbreadth superiorly and medially from the first injection site.  -Cervical paraspinal muscle injection, 2 sites, bilateral knee first injection site was 1 cm from the midline of the cervical spine, 3 cm inferior to the lower border of the occipital protuberance. The second injection site was 1.5 cm superiorly and laterally to the first injection site.  -Trapezius muscle injection was performed at 3 sites, bilaterally. The first injection site was in the upper trapezius muscle halfway between the inflection point of the neck, and the acromion. The second injection site was one half way between the acromion and the first injection site. The  third injection was done between the first injection site and the inflection point of the neck.   Will return for repeat injection in 3 months.   A 200 unitsof Botox was used, 155 units were injected, the rest of the Botox was wasted. The patient tolerated  the procedure well, there were no complications of the above procedure.  Ward Givens, MSN, NP-C 02/06/2022, 7:25 AM [facility] [address] [place], [place] [address] [phone]

## 2022-02-10 ENCOUNTER — Ambulatory Visit: Payer: 59 | Admitting: Adult Health

## 2022-02-19 ENCOUNTER — Ambulatory Visit (INDEPENDENT_AMBULATORY_CARE_PROVIDER_SITE_OTHER): Payer: 59 | Admitting: Psychiatry

## 2022-02-19 ENCOUNTER — Encounter: Payer: Self-pay | Admitting: Psychiatry

## 2022-02-19 ENCOUNTER — Other Ambulatory Visit: Payer: Self-pay

## 2022-02-19 VITALS — BP 136/89 | HR 113 | Temp 98.8°F | Wt 162.2 lb

## 2022-02-19 DIAGNOSIS — F5105 Insomnia due to other mental disorder: Secondary | ICD-10-CM

## 2022-02-19 DIAGNOSIS — F418 Other specified anxiety disorders: Secondary | ICD-10-CM | POA: Diagnosis not present

## 2022-02-19 DIAGNOSIS — F3342 Major depressive disorder, recurrent, in full remission: Secondary | ICD-10-CM

## 2022-02-19 MED ORDER — BUPROPION HCL ER (SR) 150 MG PO TB12
150.0000 mg | ORAL_TABLET | Freq: Two times a day (BID) | ORAL | 0 refills | Status: DC
  Filled 2022-02-19: qty 180, 90d supply, fill #0

## 2022-02-19 MED ORDER — ESCITALOPRAM OXALATE 20 MG PO TABS
ORAL_TABLET | Freq: Every day | ORAL | 0 refills | Status: DC
  Filled 2022-02-19: qty 90, 90d supply, fill #0

## 2022-02-19 NOTE — Progress Notes (Signed)
[facility] OP Progress Note  02/19/2022 5:31 PM Candice [name]  MRN:  [id number]  Chief Complaint:  Chief Complaint  Patient presents with   Follow-up: 36 year old Caucasian female with history of MDD, panic attack, insomnia was evaluated for medication management.   HPI: Candice [name] is a 36 year old Caucasian female, employed, [place], has a history of MDD, panic attacks, insomnia, bilateral hearing loss, migraine headaches, was evaluated in office today.  Patient today reports she is currently going through psychosocial stressors of her husband struggling with health problems as well as daughter who also has a seizure disorder.  Patient reports she is also having an IRS problem which she is trying to handle.  She however was able to take a few days off and go to the beach.  That kind of help.  She reports her family does try to support her.  Patient reports overall mood symptoms as currently managed on the current combination of Lexapro and Wellbutrin.  She does have anxiety triggered by her psychosocial stressors, agreeable to reach out to her therapist to restart psychotherapy sessions.  Last therapy session was in February.  Patient denies any suicidality, homicidality or perceptual disturbances.  Patient denies any other concerns today.  Visit Diagnosis:    ICD-10-CM   1. MDD (major depressive disorder), recurrent, in full remission ([place])  F33.42 escitalopram (LEXAPRO) 20 MG tablet    buPROPion (WELLBUTRIN SR) 150 MG 12 hr tablet    2. Other specified anxiety disorders  F41.8    with limited symptom attacks    3. Insomnia due to mental condition  F51.05    mood      Past Psychiatric History: Reviewed past psychiatric history from progress note on 01/10/2019.  Past Medical History:  Past Medical History:  Diagnosis Date   Anxiety    Depression    Hearing loss    Migraines    a. x 10+ years.   Postpartum care following cesarean delivery (5/29) 02/04/2014    Pre-syncope    a. 07/2015 [facility] ED visit, ? vasovagal, improved with IVF.   Short cervix in second trimester, antepartum 11/16/2013    Past Surgical History:  Procedure Laterality Date   CESAREAN SECTION N/A 02/03/2014   Procedure: Primary Cesarean Section Delivery Baby Girl @ [id number], Apgars 9/9;  Surgeon: Marvene Staff, MD;  Location: [facility];  Service: Obstetrics;  Laterality: N/A;   CESAREAN SECTION     WISDOM TOOTH EXTRACTION  2002    Family Psychiatric History: Reviewed family psychiatric history from progress note on 01/10/2019.  Family History:  Family History  Problem Relation Age of Onset   Depression Mother    Anxiety disorder Mother    Heart attack Mother        NSTEMI @ age 93.   Migraines Mother    Sleep apnea Father        alive @ 45.   Anxiety disorder Maternal Grandmother    Depression Maternal Grandmother    Diabetes Maternal Grandmother    Hypertension Maternal Grandmother    Heart attack Maternal Grandfather    Alcohol abuse Paternal Grandfather    Suicidality Paternal Grandfather     Social History: Reviewed social history from progress note on 01/10/2019. Social History   Socioeconomic History   Marital status: Married    Spouse name: Not on file   Number of children: 1   Years of education: Not on file   Highest education level: Not on file  Occupational History   Not on file  Tobacco Use   Smoking status: Never   Smokeless tobacco: Never  Vaping Use   Vaping Use: Never used  Substance and Sexual Activity   Alcohol use: Yes    Alcohol/week: 0.0 standard drinks of alcohol    Comment: maybe two drinks/month.   Drug use: No   Sexual activity: Yes    Birth control/protection: Pill  Other Topics Concern   Not on file  Social History Narrative   Lives in [place] with her husband and 72 month old dtr.  Does not routinely exercise.       Social Determinants of Health   Financial Resource Strain: Low Risk  (07/13/2018)   Overall Financial  Resource Strain (CARDIA)    Difficulty of Paying Living Expenses: Not hard at all  Food Insecurity: No Food Insecurity (07/13/2018)   Hunger Vital Sign    Worried About Running Out of Food in the Last Year: Never true    Ran Out of Food in the Last Year: Never true  Transportation Needs: No Transportation Needs (07/13/2018)   PRAPARE - Hydrologist (Medical): No    Lack of Transportation (Non-Medical): No  Physical Activity: Inactive (07/13/2018)   Exercise Vital Sign    Days of Exercise per Week: 0 days    Minutes of Exercise per Session: 0 min  Stress: Not on file  Social Connections: Unknown (07/13/2018)   Social Connection and Isolation Panel [NHANES]    Frequency of Communication with Friends and Family: Not on file    Frequency of Social Gatherings with Friends and Family: Not on file    Attends Religious Services: Not on file    Active Member of Clubs or Organizations: Not on file    Attends Archivist Meetings: Not on file    Marital Status: Married    Allergies:  Allergies  Allergen Reactions   Elemental Sulfur Hives   Sulfa Antibiotics Other (See Comments) and Hives    Metabolic Disorder Labs: No results found for: "HGBA1C", "MPG" No results found for: "PROLACTIN" No results found for: "CHOL", "TRIG", "HDL", "CHOLHDL", "VLDL", "LDLCALC" Lab Results  Component Value Date   TSH 0.696 07/12/2015    Therapeutic Level Labs: No results found for: "LITHIUM" No results found for: "VALPROATE" No results found for: "CBMZ"  Current Medications: Current Outpatient Medications  Medication Sig Dispense Refill   ALPRAZolam (XANAX) 0.25 MG tablet Take 1 tablet (0.25 mg total) by mouth at bedtime as needed for anxiety. 15 tablet 0   cyanocobalamin (,VITAMIN B-12,) 1000 MCG/ML injection Inject 1 mL (1,000 mcg total) into the muscle monthly for 4 doses 1 mL 3   eletriptan (RELPAX) 40 MG tablet TAKE 1 TABLET BY MOUTH AS NEEDED FOR MIGRAINE OR  HEADACHE. MAY REPEAT IN 2 HOURS IF HEADACHE PERSISTS OR RECURS. 10 tablet 11   levonorgestrel-ethinyl estradiol (NORDETTE) 0.15-30 MG-MCG tablet Take 1 tablet by mouth once daily. Take active pills ONLY 112 tablet 1   ondansetron (ZOFRAN-ODT) 8 MG disintegrating tablet Take 1 tablet (8 mg total) by mouth every 8 (eight) hours as needed for Nausea or Vomiting 20 tablet 2   promethazine (PHENERGAN) 25 MG tablet TAKE 1 TABLET BY MOUTH EVERY 8 (EIGHT) HOURS AS NEEDED FOR NAUSEA 30 tablet 0   Rimegepant Sulfate (NURTEC) 75 MG TBDP Take 1 tablet by mouth daily as needed. 10 tablet 2   Tuberculin-Allergy Syringes (VANISHPOINT TUBERCULIN SYRINGE) 25G X 1" 1 ML  MISC Use as directed for up to 30 days 1 each 3   Ubrogepant 100 MG TABS TAKE 1 TABLET BY MOUTH AS NEEDED. 16 tablet 11   buPROPion (WELLBUTRIN SR) 150 MG 12 hr tablet Take 1 tablet (150 mg total) by mouth 2 (two) times daily. Take daily at 8:30 AM and at 2 PM 180 tablet 0   escitalopram (LEXAPRO) 20 MG tablet TAKE 1 TABLET BY MOUTH DAILY. 90 tablet 0   propranolol ER (INDERAL LA) 60 MG 24 hr capsule Take 1 capsule (60 mg total) by mouth at bedtime. (Patient not taking: Reported on 02/19/2022) 30 capsule 6   No current facility-administered medications for this visit.     Musculoskeletal: Strength & Muscle Tone: within normal limits Gait & Station: normal Patient leans: N/A  Psychiatric Specialty Exam: Review of Systems  Psychiatric/Behavioral:  The patient is nervous/anxious.   All other systems reviewed and are negative.   Blood pressure 136/89, pulse (!) 113, temperature 98.8 F (37.1 C), temperature source Temporal, weight 162 lb 3.2 oz (73.6 kg).Body mass index is 27.84 kg/m.  General Appearance: Casual  Eye Contact:  Fair  Speech:  Clear and Coherent  Volume:  Normal  Mood:  Anxious  Affect:  Congruent  Thought Process:  Goal Directed and Descriptions of Associations: Intact  Orientation:  Full (Time, Place, and Person)   Thought Content: Logical   Suicidal Thoughts:  No  Homicidal Thoughts:  No  Memory:  Immediate;   Fair Recent;   Fair Remote;   Fair  Judgement:  Fair  Insight:  Fair  Psychomotor Activity:  Normal  Concentration:  Concentration: Fair and Attention Span: Fair  Recall:  [employer] of Knowledge: Fair  Language: Fair  Akathisia:  No  Handed:  Right  AIMS (if indicated): not done  Assets:  Communication Skills Desire for Improvement Housing Social Support  ADL's:  Intact  Cognition: WNL  Sleep:  Fair   Screenings: GAD-7    [facility] Office Visit from 02/19/2022 in [facility]  Total GAD-7 Score 5      PHQ2-9    [facility] Visit from 02/19/2022 in [facility] Office Visit from 11/20/2021 in [facility] Visit from 08/13/2021 in [facility] from 07/24/2021 in [facility] Visit from 06/11/2021 in [facility]  PHQ-2 Total Score 1 0 1 2 2   PHQ-9 Total Score -- 4 -- -- 4      [facility] Office Visit from 11/20/2021 in [facility] Counselor from 09/18/2021 in [facility] Office Visit from 08/13/2021 in [facility] No Risk No Risk No Risk        Assessment and Plan: Candice [name] is a 36 year old Caucasian female, employed, married, has a history of depression, anxiety, insomnia was evaluated in office today.  Patient with current psychosocial stressors, will benefit from psychotherapy sessions, agreeable to reach out to therapist.  Continue medications as noted below.  Plan MDD in remission Lexapro 20 mg p.o. daily Wellbutrin SR 150 mg p.o. twice daily  Other specified anxiety disorder with limited symptom attack-stable Lexapro was prescribed Xanax 0.25 mg  p.o. daily as needed for severe anxiety attacks Reviewed [place] PMP AWARE. Continue CBT with Ms.Christina-patient agrees to reach out.  Insomnia-improving Continue sleep hygiene techniques.   Follow-up in clinic in 2 months or sooner if needed.  This note was generated in part or whole with voice recognition software. Voice recognition is usually quite accurate but there are transcription errors that can and very often do occur. I apologize for any typographical errors that were not detected and corrected.    Ursula Alert, MD 02/20/2022, 8:28 AM

## 2022-02-20 ENCOUNTER — Other Ambulatory Visit: Payer: Self-pay

## 2022-02-21 ENCOUNTER — Telehealth (INDEPENDENT_AMBULATORY_CARE_PROVIDER_SITE_OTHER): Payer: 59 | Admitting: Licensed Clinical Social Worker

## 2022-02-21 DIAGNOSIS — F3342 Major depressive disorder, recurrent, in full remission: Secondary | ICD-10-CM | POA: Diagnosis not present

## 2022-02-21 DIAGNOSIS — F418 Other specified anxiety disorders: Secondary | ICD-10-CM | POA: Diagnosis not present

## 2022-02-21 NOTE — Plan of Care (Signed)
  Problem: Anxiety Disorder CCP Problem  1 Reduce overall frequency, intensity, and duration of the anxiety so that daily functioning is not impaired.  Goal: LTG: Patient will score less than 5 on the Generalized Anxiety Disorder 7 Scale (GAD-7) Outcome: Progressing Goal: STG: Patient will participate in at least 80% of scheduled individual psychotherapy sessions Outcome: Progressing Intervention: Work with patient to identify the major components of a recent episode of anxiety: physical symptoms, major thoughts and images, and major behaviors they experienced Note: Explored/identified

## 2022-02-21 NOTE — Progress Notes (Signed)
Virtual Visit via Video Note  I connected with Candice [name] on 02/21/22 at  8:00 AM EDT by a video enabled telemedicine application and verified that I am speaking with the correct person using two identifiers.  Location: Patient: home Provider: remote office [place], [place])   I discussed the limitations of evaluation and management by telemedicine and the availability of in person appointments. The patient expressed understanding and agreed to proceed.  I discussed the assessment and treatment plan with the patient. The patient was provided an opportunity to ask questions and all were answered. The patient agreed with the plan and demonstrated an understanding of the instructions.   The patient was advised to call back or seek an in-person evaluation if the symptoms worsen or if the condition fails to improve as anticipated.  I provided 40 minutes of non-face-to-face time during this encounter.   Jamaia Brum R Michale Weikel, LCSW   THERAPIST PROGRESS NOTE  Session Time: [phone]  Participation Level: Active  Behavioral Response: Neat and Well GroomedAlertAnxious  Type of Therapy: Individual Therapy  Treatment Goals addressed: Problem: Anxiety Disorder CCP Problem  1 Reduce overall frequency, intensity, and duration of the anxiety so that daily functioning is not impaired.  Goal: LTG: Patient will score less than 5 on the Generalized Anxiety Disorder 7 Scale (GAD-7) Outcome: Progressing Goal: STG: Patient will participate in at least 80% of scheduled individual psychotherapy sessions Outcome: Progressing Intervention: Work with patient to identify the major components of a recent episode of anxiety: physical symptoms, major thoughts and images, and major behaviors they experienced Note: Explored/identified    ProgressTowards Goals: Progressing  Interventions: Solution Focused and Supportive  Summary: Candice [name] is a 36 y.o. female who presents with improving symptoms  related to depression. Pt reports some situational anxiety and feels that she is using coping skills to manage anxiety.   Allowed pt to explore and express thoughts and feelings associated with recent life situations and external stressors.Patient reports some stress associated with husband's recent acute illness. Patient reports that her husband acquired acute sepsis and was hospitalized x 2. Patient reports that during his recovery she was primary caregiver to the children and she also had to take care of his needs. Patient reports that she did all this in addition to continuing to work full time. Patient reports that This was a very stressful time of life for her. Patient reports that she used her coping skills to manage stress and to keep from feeling so overwhelmed. Patient reports that she has continuing concerned about her daughter, and recent tests that shows she has a seizure disorder.  Patient reports that she feels that she overthinks things at times period allow patient to explore her thoughts and help patient to identify ways that she could strategically plan versus feeling overwhelmed.  Patient feels that she's doing a good job of using her coping skills in the moment, but recognizes that she needs to improve with her overall self-care.  Continued recommendations are as follows: self care behaviors, positive social engagements, focusing on overall work/home/life balance, and focusing on positive physical and emotional wellness.   Suicidal/Homicidal: No  Therapist Response: Pt is continuing to apply interventions learned in session into daily life situations. Pt is currently on track to meet goals utilizing interventions mentioned above. Personal growth and progress noted. Treatment to continue as indicated.   Plan: Return again in 4 weeks.  Diagnosis: No diagnosis found.  Collaboration of Care: Other Pt encouraged to continue psychiatric care  with Dr. Elna Breslow  Patient/Guardian was  advised Release of Information must be obtained prior to any record release in order to collaborate their care with an outside provider. Patient/Guardian was advised if they have not already done so to contact the registration department to sign all necessary forms in order for [place] to release information regarding their care.   Consent: Patient/Guardian gives verbal consent for treatment and assignment of benefits for services provided during this visit. Patient/Guardian expressed understanding and agreed to proceed.   Ernest Haber Zunaira Lamy, LCSW 02/21/2022

## 2022-03-07 ENCOUNTER — Other Ambulatory Visit (HOSPITAL_COMMUNITY): Payer: Self-pay

## 2022-03-11 MED FILL — Promethazine HCl Tab 25 MG: ORAL | 10 days supply | Qty: 30 | Fill #0 | Status: AC

## 2022-03-12 ENCOUNTER — Other Ambulatory Visit: Payer: Self-pay

## 2022-03-13 ENCOUNTER — Other Ambulatory Visit: Payer: Self-pay

## 2022-03-13 ENCOUNTER — Telehealth: Payer: Self-pay

## 2022-03-13 NOTE — Telephone Encounter (Signed)
PA for Candice [name] has been sent via [email or web address] [name] - PA Case ID: [id number] and received instant approval  maximum of 12 fills from 03/13/2022 to 03/13/2023,

## 2022-03-14 ENCOUNTER — Other Ambulatory Visit: Payer: Self-pay

## 2022-03-20 DIAGNOSIS — R42 Dizziness and giddiness: Secondary | ICD-10-CM | POA: Diagnosis not present

## 2022-03-20 DIAGNOSIS — H903 Sensorineural hearing loss, bilateral: Secondary | ICD-10-CM | POA: Diagnosis not present

## 2022-04-01 ENCOUNTER — Other Ambulatory Visit: Payer: Self-pay

## 2022-04-01 DIAGNOSIS — R300 Dysuria: Secondary | ICD-10-CM | POA: Diagnosis not present

## 2022-04-01 MED ORDER — CIPROFLOXACIN HCL 500 MG PO TABS
500.0000 mg | ORAL_TABLET | Freq: Two times a day (BID) | ORAL | 0 refills | Status: DC
Start: 2022-04-01 — End: 2022-05-06
  Filled 2022-04-01: qty 14, 7d supply, fill #0

## 2022-04-15 ENCOUNTER — Ambulatory Visit (HOSPITAL_COMMUNITY): Payer: Self-pay | Admitting: Licensed Clinical Social Worker

## 2022-04-25 DIAGNOSIS — R42 Dizziness and giddiness: Secondary | ICD-10-CM | POA: Diagnosis not present

## 2022-05-06 ENCOUNTER — Ambulatory Visit: Payer: 59 | Admitting: Adult Health

## 2022-05-06 ENCOUNTER — Encounter: Payer: Self-pay | Admitting: Adult Health

## 2022-05-06 DIAGNOSIS — G43709 Chronic migraine without aura, not intractable, without status migrainosus: Secondary | ICD-10-CM | POA: Diagnosis not present

## 2022-05-06 MED ORDER — ONABOTULINUMTOXINA 200 UNITS IJ SOLR
155.0000 [IU] | Freq: Once | INTRAMUSCULAR | Status: AC
  Administered 2022-05-06: 155 [IU] via INTRAMUSCULAR

## 2022-05-06 NOTE — Progress Notes (Signed)
Botox- 200 units x 1 vial Lot: [id number] Expiration: 10/2024 NDC: 0023-3921-02  Bacteriostatic 0.9% Sodium Chloride- 4mL total Lot: [id number] Expiration: 04/09/2023 NDC: 0409-1966-02  Dx: G43.709 B/B 

## 2022-05-06 NOTE — Progress Notes (Signed)
05/06/22: Botox is working well.  She is reports that she has been having trouble with vertigo not associated with her migraines.  Will be start vestibular rehab    02/06/22: Tried nurtec daily for the last week but not sure it helped? Maybe knocked the edge off. Otherwise her headaches are controlled. Has a mild headache 1-2 times a week. Usually will try OTC medicine first then will use ubrelvy. Encouraged her to use the ubrelvy first thing.  11/12/20: Patient states that Botox is working well for her.  She states the week before her Botox is due she tends to have more headaches.  She is using the Ubrelvy and sometimes nurtrec. Advised that she could try taking nurtec daily 5 days before her cycle to try to prevent migraines.    BOTOX PROCEDURE NOTE FOR MIGRAINE HEADACHE    Contraindications and precautions discussed with patient(above). Aseptic procedure was observed and patient tolerated procedure. Procedure performed by Butch Penny, NP  The condition has existed for more than 6 months, and pt does not have a diagnosis of ALS, Myasthenia Gravis or Lambert-Eaton Syndrome.  Risks and benefits of injections discussed and pt agrees to proceed with the procedure.  Written consent obtained  These injections are medically necessary. These injections do not cause sedations or hallucinations which the oral therapies may cause.  Indication/Diagnosis: chronic migraine BOTOX(J0585) injection was performed according to protocol by [employer]. 200 units of BOTOX was dissolved into 4 cc NS.   NDC: [address]-38  Type of toxin: Botox    Botox- 100 units x 2 vials Lot: [id number] Expiration: 08/2024 NDC: 6734-1937-90   Bacteriostatic 0.9% Sodium Chloride- 38mL total Lot: [id number] Expiration: 04/09/2023 NDC: 5329-9242-68   Dx: T41.962  Description of procedure:  The patient was placed in a sitting position. The standard protocol was used for Botox as follows, with 5 units of Botox injected at  each site:   -Procerus muscle, midline injection  -Corrugator muscle, bilateral injection  -Frontalis muscle, bilateral injection, with 2 sites each side, medial injection was performed in the upper one third of the frontalis muscle, in the region vertical from the medial inferior edge of the superior orbital rim. The lateral injection was again in the upper one third of the forehead vertically above the lateral limbus of the cornea, 1.5 cm lateral to the medial injection site.  -Temporalis muscle injection, 4 sites, bilaterally. The first injection was 3 cm above the tragus of the ear, second injection site was 1.5 cm to 3 cm up from the first injection site in line with the tragus of the ear. The third injection site was 1.5-3 cm forward between the first 2 injection sites. The fourth injection site was 1.5 cm posterior to the second injection site.  -Occipitalis muscle injection, 3 sites, bilaterally. The first injection was done one half way between the occipital protuberance and the tip of the mastoid process behind the ear. The second injection site was done lateral and superior to the first, 1 fingerbreadth from the first injection. The third injection site was 1 fingerbreadth superiorly and medially from the first injection site.  -Cervical paraspinal muscle injection, 2 sites, bilateral knee first injection site was 1 cm from the midline of the cervical spine, 3 cm inferior to the lower border of the occipital protuberance. The second injection site was 1.5 cm superiorly and laterally to the first injection site.  -Trapezius muscle injection was performed at 3 sites, bilaterally. The first injection site was in  the upper trapezius muscle halfway between the inflection point of the neck, and the acromion. The second injection site was one half way between the acromion and the first injection site. The third injection was done between the first injection site and the inflection point of the  neck.   Will return for repeat injection in 3 months.   A 200 unitsof Botox was used, 155 units were injected, the rest of the Botox was wasted. The patient tolerated the procedure well, there were no complications of the above procedure.  Butch Penny, MSN, NP-C 05/06/2022, 8:08 AM [facility] [address] [place], [place] [address] [phone]

## 2022-05-13 ENCOUNTER — Ambulatory Visit (INDEPENDENT_AMBULATORY_CARE_PROVIDER_SITE_OTHER): Payer: 59 | Admitting: Licensed Clinical Social Worker

## 2022-05-13 ENCOUNTER — Encounter: Payer: Self-pay | Admitting: *Deleted

## 2022-05-13 DIAGNOSIS — F418 Other specified anxiety disorders: Secondary | ICD-10-CM | POA: Diagnosis not present

## 2022-05-13 DIAGNOSIS — F3342 Major depressive disorder, recurrent, in full remission: Secondary | ICD-10-CM | POA: Diagnosis not present
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
## 2022-05-14 ENCOUNTER — Other Ambulatory Visit: Payer: Self-pay

## 2022-05-14 ENCOUNTER — Encounter (HOSPITAL_COMMUNITY): Payer: Self-pay | Admitting: Licensed Clinical Social Worker

## 2022-05-14 NOTE — Telephone Encounter (Signed)
Completed Nurtec PA on Cover My Meds. Key: [id number]. Awaiting determination from [employer].

## 2022-05-21 NOTE — Telephone Encounter (Signed)
Filled out med [employer] form for pending approval for nurtec.

## 2022-05-26 ENCOUNTER — Other Ambulatory Visit: Payer: Self-pay

## 2022-05-26 ENCOUNTER — Encounter: Payer: Self-pay | Admitting: Psychiatry

## 2022-05-26 ENCOUNTER — Ambulatory Visit (INDEPENDENT_AMBULATORY_CARE_PROVIDER_SITE_OTHER): Payer: 59 | Admitting: Psychiatry

## 2022-05-26 VITALS — BP 134/94 | HR 101 | Temp 98.5°F | Wt 163.2 lb

## 2022-05-26 DIAGNOSIS — F3342 Major depressive disorder, recurrent, in full remission: Secondary | ICD-10-CM | POA: Diagnosis not present

## 2022-05-26 DIAGNOSIS — F5105 Insomnia due to other mental disorder: Secondary | ICD-10-CM

## 2022-05-26 DIAGNOSIS — F418 Other specified anxiety disorders: Secondary | ICD-10-CM

## 2022-05-26 MED ORDER — ESCITALOPRAM OXALATE 20 MG PO TABS
ORAL_TABLET | Freq: Every day | ORAL | 0 refills | Status: DC
  Filled 2022-05-26: qty 90, 90d supply, fill #0

## 2022-05-26 MED ORDER — ALPRAZOLAM 0.25 MG PO TABS
0.2500 mg | ORAL_TABLET | Freq: Every evening | ORAL | 0 refills | Status: DC | PRN
  Filled 2022-05-26: qty 15, 15d supply, fill #0

## 2022-05-26 MED ORDER — BUPROPION HCL ER (SR) 150 MG PO TB12
150.0000 mg | ORAL_TABLET | Freq: Two times a day (BID) | ORAL | 0 refills | Status: DC
  Filled 2022-05-26: qty 180, 90d supply, fill #0

## 2022-05-26 NOTE — Progress Notes (Unsigned)
BH MD OP Progress Note  05/26/2022 5:18 PM Candice [name]  MRN:  [id number]  Chief Complaint:  Chief Complaint  Patient presents with   Follow-up: 36 year old Caucasian female with history of depression, panic attacks, was evaluated for medication management.   HPI: Candice [name] is a 36 year old Caucasian female, employed, lives in [place], has a history of MDD, panic attacks, insomnia, bilateral hearing loss, migraine headache was evaluated in office today.  Patient today reports multiple psychosocial stressors including job-related stressors, staff shortage at work, her husband going through medical problems, her husband changing his job.  Patient reports having anxiety symptoms like racing heart rate, nervousness, happening out of the blue with no triggers at times.  Patient however reports she has been able to distract herself, work on relaxation techniques and that has helped.  Continues to follow-up with her therapist, has upcoming appointment.  Currently compliant on her medications like Lexapro and Wellbutrin.  Denies side effects to medications.  Denies any suicidality, homicidality or perceptual disturbances.  Denies any other concerns today.  Visit Diagnosis:    ICD-10-CM   1. MDD (major depressive disorder), recurrent, in full remission (HCC)  F33.42 escitalopram (LEXAPRO) 20 MG tablet    buPROPion (WELLBUTRIN SR) 150 MG 12 hr tablet    2. Other specified anxiety disorders  F41.8 ALPRAZolam (XANAX) 0.25 MG tablet   with limited symptom attacks    3. Insomnia due to mental condition  F51.05    Mood      Past Psychiatric History: Reviewed past psychiatric history from progress note on 01/10/2019.  Past Medical History:  Past Medical History:  Diagnosis Date   Anxiety    Depression    Hearing loss    Migraines    a. x 10+ years.   Postpartum care following cesarean delivery (5/29) 02/04/2014   Pre-syncope    a. 07/2015 [facility] ED visit, ? vasovagal,  improved with IVF.   Short cervix in second trimester, antepartum 11/16/2013    Past Surgical History:  Procedure Laterality Date   CESAREAN SECTION N/A 02/03/2014   Procedure: Primary Cesarean Section Delivery Baby Girl @ 2244, Apgars 9/9;  Surgeon: Serita Kyle, MD;  Location: [facility];  Service: Obstetrics;  Laterality: N/A;   CESAREAN SECTION     WISDOM TOOTH EXTRACTION  2002    Family Psychiatric History: Reviewed family psychiatric history from progress note on 01/10/2019.  Family History:  Family History  Problem Relation Age of Onset   Depression Mother    Anxiety disorder Mother    Heart attack Mother        NSTEMI @ age 39.   Migraines Mother    Sleep apnea Father        alive @ 24.   Anxiety disorder Maternal Grandmother    Depression Maternal Grandmother    Diabetes Maternal Grandmother    Hypertension Maternal Grandmother    Heart attack Maternal Grandfather    Alcohol abuse Paternal Grandfather    Suicidality Paternal Grandfather     Social History: Reviewed social history from progress note on 01/10/2019. Social History   Socioeconomic History   Marital status: Married    Spouse name: Not on file   Number of children: 1   Years of education: Not on file   Highest education level: Not on file  Occupational History   Not on file  Tobacco Use   Smoking status: Never   Smokeless tobacco: Never  Vaping Use   Vaping Use:  Never used  Substance and Sexual Activity   Alcohol use: Yes    Alcohol/week: 0.0 standard drinks of alcohol    Comment: maybe two drinks/month.   Drug use: No   Sexual activity: Yes    Birth control/protection: Pill  Other Topics Concern   Not on file  Social History Narrative   Lives in [place] with her husband and 31 month old dtr.  Does not routinely exercise.       Social Determinants of Health   Financial Resource Strain: Low Risk  (07/13/2018)   Overall Financial Resource Strain (CARDIA)    Difficulty of Paying Living  Expenses: Not hard at all  Food Insecurity: No Food Insecurity (07/13/2018)   Hunger Vital Sign    Worried About Running Out of Food in the Last Year: Never true    Ran Out of Food in the Last Year: Never true  Transportation Needs: No Transportation Needs (07/13/2018)   PRAPARE - Administrator, Civil Service (Medical): No    Lack of Transportation (Non-Medical): No  Physical Activity: Inactive (07/13/2018)   Exercise Vital Sign    Days of Exercise per Week: 0 days    Minutes of Exercise per Session: 0 min  Stress: Not on file  Social Connections: Unknown (07/13/2018)   Social Connection and Isolation Panel [NHANES]    Frequency of Communication with Friends and Family: Not on file    Frequency of Social Gatherings with Friends and Family: Not on file    Attends Religious Services: Not on file    Active Member of Clubs or Organizations: Not on file    Attends Banker Meetings: Not on file    Marital Status: Married    Allergies:  Allergies  Allergen Reactions   Elemental Sulfur Hives   Sulfa Antibiotics Other (See Comments) and Hives    Metabolic Disorder Labs: No results found for: "HGBA1C", "MPG" No results found for: "PROLACTIN" No results found for: "CHOL", "TRIG", "HDL", "CHOLHDL", "VLDL", "LDLCALC" Lab Results  Component Value Date   TSH 0.696 07/12/2015    Therapeutic Level Labs: No results found for: "LITHIUM" No results found for: "VALPROATE" No results found for: "CBMZ"  Current Medications: Current Outpatient Medications  Medication Sig Dispense Refill   cyanocobalamin (,VITAMIN B-12,) 1000 MCG/ML injection Inject 1 mL (1,000 mcg total) into the muscle monthly for 4 doses 1 mL 3   eletriptan (RELPAX) 40 MG tablet TAKE 1 TABLET BY MOUTH AS NEEDED FOR MIGRAINE OR HEADACHE. MAY REPEAT IN 2 HOURS IF HEADACHE PERSISTS OR RECURS. 10 tablet 11   levonorgestrel-ethinyl estradiol (NORDETTE) 0.15-30 MG-MCG tablet Take 1 tablet by mouth once  daily. Take active pills ONLY 112 tablet 1   ondansetron (ZOFRAN-ODT) 8 MG disintegrating tablet Take 1 tablet (8 mg total) by mouth every 8 (eight) hours as needed for Nausea or Vomiting 20 tablet 2   propranolol ER (INDERAL LA) 60 MG 24 hr capsule Take 1 capsule (60 mg total) by mouth at bedtime. 30 capsule 6   Rimegepant Sulfate (NURTEC) 75 MG TBDP Take 1 tablet by mouth daily as needed. 10 tablet 2   Tuberculin-Allergy Syringes (VANISHPOINT TUBERCULIN SYRINGE) 25G X 1" 1 ML MISC Use as directed for up to 30 days 1 each 3   Ubrogepant (UBRELVY PO) Take by mouth.     ALPRAZolam (XANAX) 0.25 MG tablet Take 1 tablet (0.25 mg total) by mouth at bedtime as needed for anxiety. 15 tablet 0   buPROPion [facility]  SR) 150 MG 12 hr tablet Take 1 tablet (150 mg total) by mouth 2 (two) times daily. Take daily at 8:30 AM and at 2 PM 180 tablet 0   escitalopram (LEXAPRO) 20 MG tablet TAKE 1 TABLET BY MOUTH DAILY. 90 tablet 0   promethazine (PHENERGAN) 25 MG tablet TAKE 1 TABLET BY MOUTH EVERY 8 (EIGHT) HOURS AS NEEDED FOR NAUSEA 30 tablet 0   No current facility-administered medications for this visit.     Musculoskeletal: Strength & Muscle Tone: within normal limits Gait & Station: normal Patient leans: N/A  Psychiatric Specialty Exam: Review of Systems  Psychiatric/Behavioral: Negative.    All other systems reviewed and are negative.   Blood pressure (!) 134/94, pulse (!) 101, temperature 98.5 F (36.9 C), temperature source Temporal, weight 163 lb 3.2 oz (74 kg).Body mass index is 28.01 kg/m.  General Appearance: Casual  Eye Contact:  Fair  Speech:  Clear and Coherent  Volume:  Normal  Mood:  Anxious  Affect:  Congruent  Thought Process:  Goal Directed and Descriptions of Associations: Intact  Orientation:  Full (Time, Place, and Person)  Thought Content: Logical   Suicidal Thoughts:  No  Homicidal Thoughts:  No  Memory:  Immediate;   Fair Recent;   Fair Remote;   Fair  Judgement:   Fair  Insight:  Fair  Psychomotor Activity:  Normal  Concentration:  Concentration: Fair and Attention Span: Fair  Recall:  FiservFair  Fund of Knowledge: Fair  Language: Fair  Akathisia:  No  Handed:  Right  AIMS (if indicated): not done  Assets:  Communication Skills Desire for Improvement Housing Social Support  ADL's:  Intact  Cognition: WNL  Sleep:  Fair   Screenings: GAD-7    Flowsheet Row [facility]  Total GAD-7 Score 4 5      PHQ2-9    Flowsheet Row [facility] Counselor from 05/13/2022 in [facility] [place] Office Visit from [facility] Office [facility] Office [facility]  PHQ-2 Total Score 0 1 1 0 1  PHQ-9 Total Score 2 -- -- 4 --      Flowsheet Row Office Visit [facility] 05/26/2022 [facility] Counselor from 05/13/2022 in [facility] [place] Office Visit [facility]  C-SSRS RISK CATEGORY No Risk No Risk No Risk        Assessment and Plan: Candice [name] is a 36 year old Caucasian female, employed, married, has a history of depression, anxiety, insomnia was evaluated in office today.  Patient is currently stable.  Plan as noted below.  Plan MDD in remission Lexapro 20 mg p.o. daily Wellbutrin SR 150 mg p.o. twice daily  Other specified anxiety disorder with limited symptom attack-stable Lexapro as prescribed Xanax 0.25 mg as needed for severe anxiety attacks Reviewed and CPNP aware Continue CBT with Ms. Christina Hussami  Insomnia-stable Continue sleep hygiene techniques  Follow-up in clinic  in 4 months or sooner if needed.  This note was generated in part or whole with voice recognition software. Voice recognition is usually quite accurate but there are transcription errors that can and very often do occur. I apologize for any typographical errors that were not detected and corrected.  Ursula Alert, MD 05/26/2022, 5:18 PM

## 2022-05-27 ENCOUNTER — Other Ambulatory Visit: Payer: Self-pay

## 2022-06-01 ENCOUNTER — Other Ambulatory Visit: Payer: Self-pay

## 2022-06-02 ENCOUNTER — Other Ambulatory Visit: Payer: Self-pay

## 2022-06-02 ENCOUNTER — Other Ambulatory Visit: Payer: Self-pay | Admitting: Adult Health

## 2022-06-03 ENCOUNTER — Other Ambulatory Visit: Payer: Self-pay

## 2022-06-03 MED ORDER — NURTEC 75 MG PO TBDP
1.0000 | ORAL_TABLET | Freq: Every day | ORAL | 2 refills | Status: DC | PRN
  Filled 2022-06-03: qty 8, 30d supply, fill #0
  Filled 2022-08-04: qty 8, 30d supply, fill #1
  Filled 2022-09-04: qty 8, 30d supply, fill #2
  Filled 2022-11-10: qty 6, 21d supply, fill #3

## 2022-06-05 ENCOUNTER — Other Ambulatory Visit: Payer: Self-pay

## 2022-06-11 ENCOUNTER — Other Ambulatory Visit: Payer: Self-pay

## 2022-06-19 ENCOUNTER — Telehealth: Payer: 59 | Admitting: Family

## 2022-06-19 DIAGNOSIS — B002 Herpesviral gingivostomatitis and pharyngotonsillitis: Secondary | ICD-10-CM | POA: Diagnosis not present

## 2022-06-19 MED ORDER — VALACYCLOVIR HCL 1 G PO TABS: 2000.0000 mg | ORAL_TABLET | Freq: Two times a day (BID) | ORAL | 0 refills | Status: AC

## 2022-06-19 NOTE — Progress Notes (Signed)

## 2022-07-01 ENCOUNTER — Other Ambulatory Visit: Payer: Self-pay

## 2022-07-01 DIAGNOSIS — M533 Sacrococcygeal disorders, not elsewhere classified: Secondary | ICD-10-CM | POA: Diagnosis not present

## 2022-07-01 DIAGNOSIS — G43119 Migraine with aura, intractable, without status migrainosus: Secondary | ICD-10-CM | POA: Diagnosis not present

## 2022-07-01 DIAGNOSIS — E538 Deficiency of other specified B group vitamins: Secondary | ICD-10-CM | POA: Diagnosis not present

## 2022-07-01 DIAGNOSIS — M545 Low back pain, unspecified: Secondary | ICD-10-CM | POA: Diagnosis not present

## 2022-07-01 MED ORDER — METHOCARBAMOL 500 MG PO TABS
ORAL_TABLET | ORAL | 0 refills | Status: DC
  Filled 2022-07-01: qty 24, 12d supply, fill #0
  Filled 2022-07-01: qty 6, 3d supply, fill #0

## 2022-07-01 MED ORDER — VALACYCLOVIR HCL 1 G PO TABS
ORAL_TABLET | ORAL | 0 refills | Status: DC
  Filled 2022-07-01: qty 10, 2d supply, fill #0

## 2022-07-01 MED ORDER — PREDNISONE 20 MG PO TABS
ORAL_TABLET | ORAL | 0 refills | Status: DC
  Filled 2022-07-01: qty 15, 10d supply, fill #0

## 2022-07-02 ENCOUNTER — Other Ambulatory Visit: Payer: Self-pay

## 2022-07-28 ENCOUNTER — Ambulatory Visit (HOSPITAL_COMMUNITY): Payer: 59 | Admitting: Licensed Clinical Social Worker

## 2022-07-28 ENCOUNTER — Encounter (HOSPITAL_COMMUNITY): Payer: Self-pay

## 2022-07-30 ENCOUNTER — Encounter: Payer: Self-pay | Admitting: *Deleted

## 2022-08-04 ENCOUNTER — Ambulatory Visit: Payer: 59 | Admitting: Adult Health

## 2022-08-04 ENCOUNTER — Other Ambulatory Visit: Payer: Self-pay

## 2022-08-04 ENCOUNTER — Other Ambulatory Visit (HOSPITAL_BASED_OUTPATIENT_CLINIC_OR_DEPARTMENT_OTHER): Payer: Self-pay

## 2022-08-04 ENCOUNTER — Encounter: Payer: Self-pay | Admitting: Adult Health

## 2022-08-04 VITALS — BP 157/89 | HR 106 | Ht 64.0 in | Wt 165.0 lb

## 2022-08-04 DIAGNOSIS — G43709 Chronic migraine without aura, not intractable, without status migrainosus: Secondary | ICD-10-CM

## 2022-08-04 MED ORDER — UBRELVY 100 MG PO TABS
ORAL_TABLET | ORAL | 12 refills | Status: DC
  Filled 2022-08-04: qty 10, 30d supply, fill #0
  Filled 2022-09-04: qty 10, 30d supply, fill #1
  Filled 2022-11-10: qty 10, 30d supply, fill #2
  Filled 2022-12-11: qty 10, 30d supply, fill #3
  Filled 2023-01-07: qty 10, 30d supply, fill #4
  Filled 2023-02-04: qty 10, 30d supply, fill #5
  Filled 2023-03-09: qty 10, 30d supply, fill #6
  Filled 2023-04-27 – 2023-06-08 (×2): qty 10, 30d supply, fill #7
  Filled 2023-07-22: qty 10, 30d supply, fill #8

## 2022-08-04 MED ORDER — ONABOTULINUMTOXINA 200 UNITS IJ SOLR
155.0000 [IU] | Freq: Once | INTRAMUSCULAR | Status: AC
  Administered 2022-08-04: 155 [IU] via INTRAMUSCULAR

## 2022-08-04 NOTE — Progress Notes (Signed)
08/04/22: Reports that Botox works well except for the last 2 weeks if she starts to have breakthrough headaches.  She states that she typically has 2 mild headaches during the week that she can take Excedrin Migraine.  Has not had any severe headaches.  Tries to take Nurtec as a preventative medicine 2 weeks before her Botox is due.  Will use [name] as an abortive medication if needed  05/06/22: Botox is working well.  She is reports that she has been having trouble with vertigo not associated with her migraines.  Will be start vestibular rehab  02/06/22: Tried nurtec daily for the last week but not sure it helped? Maybe knocked the edge off. Otherwise her headaches are controlled. Has a mild headache 1-2 times a week. Usually will try OTC medicine first then will use ubrelvy. Encouraged her to use the ubrelvy first thing.  11/12/20: Patient states that Botox is working well for her.  She states the week before her Botox is due she tends to have more headaches.  She is using the Ubrelvy and sometimes nurtrec. Advised that she could try taking nurtec daily 5 days before her cycle to try to prevent migraines.    BOTOX PROCEDURE NOTE FOR MIGRAINE HEADACHE    Contraindications and precautions discussed with patient(above). Aseptic procedure was observed and patient tolerated procedure. Procedure performed by Ward Givens, NP  The condition has existed for more than 6 months, and pt does not have a diagnosis of ALS, Myasthenia Gravis or Lambert-Eaton Syndrome.  Risks and benefits of injections discussed and pt agrees to proceed with the procedure.  Written consent obtained  These injections are medically necessary. These injections do not cause sedations or hallucinations which the oral therapies may cause.  Indication/Diagnosis: chronic migraine BOTOX(J0585) injection was performed according to protocol by [employer]. 200 units of BOTOX was dissolved into 4 cc NS.   NDC: [id number]  Type of  toxin: Botox    Botox- 200 units x 1 vial Lot: [id number] Expiration: 12/2024 NDC: [id number]   Bacteriostatic 0.9% Sodium Chloride- 31m total Lot: [id number]: 05/10/2023 NDC: [id number]  Dx: [id number]   Description of procedure:  The patient was placed in a sitting position. The standard protocol was used for Botox as follows, with 5 units of Botox injected at each site:   -Procerus muscle, midline injection  -Corrugator muscle, bilateral injection  -Frontalis muscle, bilateral injection, with 2 sites each side, medial injection was performed in the upper one third of the frontalis muscle, in the region vertical from the medial inferior edge of the superior orbital rim. The lateral injection was again in the upper one third of the forehead vertically above the lateral limbus of the cornea, 1.5 cm lateral to the medial injection site.  -Temporalis muscle injection, 4 sites, bilaterally. The first injection was 3 cm above the tragus of the ear, second injection site was 1.5 cm to 3 cm up from the first injection site in line with the tragus of the ear. The third injection site was 1.5-3 cm forward between the first 2 injection sites. The fourth injection site was 1.5 cm posterior to the second injection site.  -Occipitalis muscle injection, 3 sites, bilaterally. The first injection was done one half way between the occipital protuberance and the tip of the mastoid process behind the ear. The second injection site was done lateral and superior to the first, 1 fingerbreadth from the first injection. The third injection site was 1 fingerbreadth superiorly  and medially from the first injection site.  -Cervical paraspinal muscle injection, 2 sites, bilateral knee first injection site was 1 cm from the midline of the cervical spine, 3 cm inferior to the lower border of the occipital protuberance. The second injection site was 1.5 cm superiorly and laterally to the first injection  site.  -Trapezius muscle injection was performed at 3 sites, bilaterally. The first injection site was in the upper trapezius muscle halfway between the inflection point of the neck, and the acromion. The second injection site was one half way between the acromion and the first injection site. The third injection was done between the first injection site and the inflection point of the neck.   Will return for repeat injection in 3 months.   A 200 unitsof Botox was used, 155 units were injected, the rest of the Botox was wasted. The patient tolerated the procedure well, there were no complications of the above procedure.  Butch Penny, MSN, NP-C 08/04/2022, 3:18 PM [facility] [address] [place], [place] [address] [phone]

## 2022-08-04 NOTE — Progress Notes (Signed)
Botox- 200 units x 1 vial Lot: [id number] Expiration: 12/2024 NDC: 0023-3921-02  Bacteriostatic 0.9% Sodium Chloride- 4mL total Lot: [id number] Expiration: 05/10/2023 NDC: 0409-1966-02  Dx: G43.709 B/B  

## 2022-08-05 ENCOUNTER — Ambulatory Visit: Payer: 59 | Admitting: Adult Health

## 2022-08-11 ENCOUNTER — Encounter: Payer: Self-pay | Admitting: Adult Health

## 2022-08-25 ENCOUNTER — Telehealth: Payer: Self-pay | Admitting: *Deleted

## 2022-08-25 NOTE — Telephone Encounter (Signed)
Not able to reach pt

## 2022-08-26 ENCOUNTER — Other Ambulatory Visit: Payer: Self-pay | Admitting: Adult Health

## 2022-08-26 ENCOUNTER — Other Ambulatory Visit: Payer: Self-pay

## 2022-08-26 DIAGNOSIS — Z0289 Encounter for other administrative examinations: Secondary | ICD-10-CM

## 2022-08-26 MED ORDER — ONDANSETRON 8 MG PO TBDP
8.0000 mg | ORAL_TABLET | Freq: Three times a day (TID) | ORAL | 2 refills | Status: DC | PRN
  Filled 2022-08-26: qty 20, 7d supply, fill #0
  Filled 2022-11-10: qty 20, 7d supply, fill #1
  Filled 2022-12-11: qty 20, 7d supply, fill #2

## 2022-08-26 MED ORDER — LEVONORGESTREL-ETHINYL ESTRAD 0.15-30 MG-MCG PO TABS
ORAL_TABLET | ORAL | 1 refills | Status: DC
  Filled 2022-08-26: qty 112, 84d supply, fill #0
  Filled 2022-11-16: qty 112, 84d supply, fill #1

## 2022-08-26 NOTE — Telephone Encounter (Signed)
FMLA form completed for migraines.  To be signed by provider.

## 2022-08-27 ENCOUNTER — Ambulatory Visit: Payer: 59 | Attending: Student

## 2022-08-27 DIAGNOSIS — R42 Dizziness and giddiness: Secondary | ICD-10-CM | POA: Diagnosis not present

## 2022-08-27 DIAGNOSIS — R2681 Unsteadiness on feet: Secondary | ICD-10-CM | POA: Diagnosis not present

## 2022-08-27 NOTE — Telephone Encounter (Signed)
2 days per episode is fine.   Do we need to distinguish how many episodes she has in a month?

## 2022-08-27 NOTE — Therapy (Signed)
OUTPATIENT PHYSICAL THERAPY VESTIBULAR EVALUATION     Patient Name: Candice [name] MRN: [id number] DOB:1986-02-19, 35 y.o., female Today's Date: 08/27/2022  END OF SESSION:  PT End of Session - 08/27/22 1651     Visit Number 1    Number of Visits 25    Date for PT Re-Evaluation 11/19/22    PT Start Time 1102    PT Stop Time 1157    PT Time Calculation (min) 55 min    Activity Tolerance Patient tolerated treatment well;No increased pain    Behavior During Therapy WFL for tasks assessed/performed             Past Medical History:  Diagnosis Date   Anxiety    Depression    Hearing loss    Migraines    a. x 10+ years.   Postpartum care following cesarean delivery (5/29) 02/04/2014   Pre-syncope    a. 07/2015 [facility] ED visit, ? vasovagal, improved with IVF.   Short cervix in second trimester, antepartum 11/16/2013   Past Surgical History:  Procedure Laterality Date   CESAREAN SECTION N/A 02/03/2014   Procedure: Primary Cesarean Section Delivery Baby Girl @ 2244, Apgars 9/9;  Surgeon: Serita Kyle, MD;  Location: [facility];  Service: Obstetrics;  Laterality: N/A;   CESAREAN SECTION     WISDOM TOOTH EXTRACTION  2002   Patient Active Problem List   Diagnosis Date Noted   MDD (major depressive disorder), recurrent episode, mild (HCC) 10/30/2020   Insomnia due to mental condition 07/14/2019   Anxiety 05/13/2019   MDD (major depressive disorder), recurrent, in full remission (HCC) 04/08/2019   Panic attacks 04/08/2019   MDD (major depressive disorder), recurrent, in partial remission (HCC) 04/08/2019   Stress at home 12/19/2016   Muscle spasm 06/06/2016   Migraine without aura and with status migrainosus, not intractable 11/23/2015   Migraine without aura and without status migrainosus, not intractable 09/11/2015   Chronic migraine without aura without status migrainosus, not intractable 09/11/2015   Tachycardia 08/27/2015   Depression, major, recurrent,  moderate (HCC) 02/19/2015   Postpartum care following cesarean delivery (5/29) 02/04/2014   Indication for care in labor or delivery 02/03/2014   Short cervix in second trimester, antepartum 11/16/2013   Antepartum bleeding, second trimester 11/15/2013    PCP: Enid Baas, MD  REFERRING PROVIDER:  Gigi Gin, PA      REFERRING DIAG:  R42 (ICD-10-CM) - Dizziness and giddiness      THERAPY DIAG:  Dizziness and giddiness  Unsteadiness on feet  ONSET DATE: 2021   Rationale for Evaluation and Treatment: Rehabilitation  SUBJECTIVE:   SUBJECTIVE STATEMENT: Pt is a pleasant 36 yo female presenting to PT eval due to dizziness and unsteadiness. She is ambulating without an AD currently. Please refer to pertinent history below regarding hx of dizziness symptoms and unsteadiness.  Pt reports her daughter has seizures and pt notices at time her own eyes flutter or she gets stuck looking in a certain direction similar to her daughter's symptoms. PT encourages pt to follow-up with her physician and a neurologist regarding these symptoms. Pt verbalizes understanding.   Pt accompanied by: self  PERTINENT HISTORY:   Pt presents to PT eval due to dizziness. Pt reports onset in 2021 and that severity of her symptoms can vary. Initial episode was severe and occurred while driving after turning her head. She also experienced vomiting with this. The pt has since had mild episodes and one other severe episode in June  or July with vomiting. She can become very unsteady when this happens.  Pt reports she has had symptoms as recent as yesterday while at work. Her symptoms were triggered by turning her head in this instance. Pt takes Zofran or Meclizine at times due to symptoms severity. She reports unsteadiness at times but no falls. Pt reports she has had positive Dix-Hallpike 2x in the past and that she thinks treatment 1x did help her. Per chart and pt her recent VNG testing was  normal.  Symptom description: can have vertigo, feelings of pressure/heaviness in the back of her head, pt can feel foggy headed at times, unsteady. Pt wears B hearing aids due to hearing loss that occurred about 3-4 years ago, hearing has not worsened. She reports L side hearing is worse than R. She at times experiences ringing in each ear and also "cricket" type noises. Pt with hx of migraines but is unsure if she experiences headache symptoms with dizziness. She reports light sensitivity, denies sensitivity to noises.   Duration of vertigo: yesterday pt reports feelings of spinning lasted seconds-minutes, in severe cases pt reports she feels it has lasted hours, but is not entirely sure if this was a spinning sensation. Her symptoms/vertigo usually are triggered due to a change in position, sometimes she feels "floaty" and repots it is "not a full blown spin." Her symptoms can then intensify with movement even without moving her head.  Movements that trigger symptoms: laying down, turning her head, bending, pt unsure about tilting her head back  PMH includes: hearing loss, chronic migraine without auroa without status migrainosus, not intractable, anxiety, depression, panic attacks, pre-syncope, muscle spasm, insomnia per pt taking B12 supplement due to hx of low B12  PAIN:  Are you having pain? No  PRECAUTIONS: Fall  WEIGHT BEARING RESTRICTIONS: No  FALLS: Has patient fallen in last 6 months? No  LIVING ENVIRONMENT:    PLOF: Independent  PATIENT GOALS: improve symptoms, balance, pt reports symptoms affecting daily activities/work  OBJECTIVE:   DIAGNOSTIC FINDINGS:   VNG testing results per referral, [place], "Dix Hallpike maneuvers were negative. There was no spontaneous or gaze nystagmus. Random saccades, smooth pursuit visual tracking and optokinetic testing were normal. No post headshake nysgamus was present. No positional nystagmus was recorded. Calorics revealed no  significant weakness. Impression: Normal VNG."  Most recent imaging per chart 06/13/2020 MR BRAIN W WO CONTRAST: "IMPRESSION: Unremarkable MRI of the brain.:  COGNITION: Overall cognitive status: Within functional limits for tasks assessed    POSTURE:  No Significant postural limitations  Cervical ROM:   Pt observed to have WFL cervical rotation and extension during session that is pain-free. Formal measurements deferred  STRENGTH: deferred   BED MOBILITY: limited occ with symptom onset (laying down can be a potential trigger)  TRANSFERS: Assistive device utilized: None  Sit to stand: Complete Independence Stand to sit: Complete Independence Comments: pt reports mobility can become severely limited when pt has dizziness symptoms due to unsteadiness. However, none at present moment limiting mobility  GAIT: G[facility]:  [facility] however, pt reports when she has dizziness onset she becomes very unsteady and must sit down or at times reach out to support herself Distance walked: clinic distances Assistive device utilized: None Level of assistance: Complete Independence   FUNCTIONAL TESTS: deferred DGI and M-CTSIB to be performed next 1-2 visits   PATIENT SURVEYS:  FOTO 51 (goal 57)  VESTIBULAR ASSESSMENT:  GENERAL OBSERVATION:    SYMPTOM BEHAVIOR:  Subjective history: Onset  of dizziness and unsteadiness in 2021 with multiple mild episodes and 2 severe episodes (see above for details)  Non-Vestibular symptoms: tinnitus, nausea/vomiting, migraine symptoms, and reports hx of syncopal episodes  Type of dizziness: Imbalance (Disequilibrium) and Spinning/Vertigo  Frequency: reports multiple episodes since 2021. Pt thinks can be positionally triggered but has at times worsened even when relatively still (not moving her head) but may be changing position  Duration: reports seconds and even hours at times  Aggravating factors:  positional changes  Relieving factors:   still/rest, rescue meds  OCULOMOTOR EXAM:  Ocular Alignment: normal  Ocular ROM: No Limitations  Spontaneous Nystagmus: absent  Gaze-Induced Nystagmus: absent  Smooth Pursuits: saccades, pt reports she can at times feel when this occurs  Saccades: intact  Convergence/Divergence: WNL  VESTIBULAR - OCULAR REFLEX:   Slow VOR: Normal  Head-Impulse Test: WNL B   POSITIONAL TESTING:  Right Dix-Hallpike: negative Left Dix-Hallpike: Performed 2x for clarity. Initial test pt with delayed onset of extremely brief jerk nystagmus that appeared rotational and possibly downbeat. Once symptoms resolved and pt sat back up, a second Dix-Hallpike test performed, pt again with delayed onset of jerk nystagmus that was rotational and clearly up-beating to L. Pt reported dizziness with testing on L side as well.   OTHOSTATICS: deferred; indicated due to hx of syncopal episodes, to be performed next 1-2 visits  FUNCTIONAL GAIT: Dynamic Gait Index: deferred   VESTIBULAR TREATMENT:                                                                                                   DATE:   Canalith Repositioning: Epley, left 2x. No symptoms/nystagmus with second maneuver but pt did report feeling icky. She reports she does have her Meclizine and Zofran if she needs it (pt reports takes as needed).  Instructed pt in post-maneuver precautions.   Issued and reviewed Symptom Trigger Tool/tracker  PATIENT EDUCATION: Education details: exam findings, positional testing, maneuvers, indications for testing/maneuvers, encourages follow-up with neurologist  Person educated: Patient Education method: Explanation and Handouts Education comprehension: verbalized understanding  HOME EXERCISE PROGRAM:  GOALS: Goals reviewed with patient? No - goals to be reviewed next appointment  SHORT TERM GOALS: Target date: 10/08/2022   Patient will be independent in home exercise program to improve positionally triggered  dizziness symptoms and balance to increase functional independence and safety with ADLs/job duties. Baseline: to be initiated (did provide and review symptom tracking tool/handout) Goal status: INITIAL   LONG TERM GOALS: Target date: 11/19/2022    Patient will increase FOTO score to equal to or greater than  57   to demonstrate statistically significant improvement in mobility and quality of life.  Baseline: 54 Goal status: INITIAL  2.  Patient will report at least a 50% reduction in positionally triggered dizziness and unsteadiness symptoms in order to increase safety with ADLs, job duties, and QOL Baseline: symptoms brought on by positional changes Goal status: INITIAL   3.  Patient will increase dynamic gait index score to >19/24 as to demonstrate reduced fall risk and improved dynamic gait balance for better  safety with community/home ambulation.   Baseline: to be completed next 1-2 visits  Goal status: INITIAL  ASSESSMENT:  CLINICAL IMPRESSION: Patient is a pleasant 36 y.o. female who was seen today for physical therapy evaluation and treatment for dizziness and unsteadiness. Pt subjective history  and exam findings indicate that central component is likely contributing to recurring dizziness issue. However, pt also found to have positive L Dix-Hallpike on exam with delayed-onset, brief (<30 sec) jerk nystagmus that was rotational and up-beating to the L. PT did provide L Epley 2x and pt with no symptoms with second Epley, but did report feeling a little off following maneuver. Pt was able to ambulate successfully without decreased balance following maneuver. PT provided symptom trigger/tracking tool at this time and recommended to pt that she follow-up with neurologist due central indicators (hx of migraines, saccadic smooth pursuits, vertigo that can last hours). PT also recommends further follow-up with ENT may be beneficial due to chronic dizziness and hearing loss, although pt with  unremarkable head-impulse testing at this time. Orthostatics should be assessed future visit due to reported history of syncope.The pt will benefit from further skilled PT to improve positionally-triggered dizziness symptoms and imbalance in order to increase QOL and safety with ADLs.  OBJECTIVE IMPAIRMENTS: decreased activity tolerance, decreased balance, difficulty walking, and dizziness.   ACTIVITY LIMITATIONS: bending, stairs, bed mobility, and locomotion level  PARTICIPATION LIMITATIONS: cleaning, driving, shopping, community activity, occupation, yard work, and all ADLs can be severely affected/limited when pt having dizzy episode  PERSONAL FACTORS: Sex, Time since onset of injury/illness/exacerbation, and 3+ comorbidities: PMH includes: hearing loss, chronic migraine without auroa without status migrainosus, not intractable, anxiety, depression, panic attacks, pre-syncope, muscle spasm, insomnia per pt taking B12 supplement due to hx of low B12  are also affecting patient's functional outcome.   REHAB POTENTIAL: Good  CLINICAL DECISION MAKING: Evolving/moderate complexity  EVALUATION COMPLEXITY: Moderate   PLAN:  PT FREQUENCY: 2x/week  PT DURATION: 12 weeks  PLANNED INTERVENTIONS: Therapeutic exercises, Therapeutic activity, Neuromuscular re-education, Balance training, Gait training, Patient/Family education, Self Care, Joint mobilization, Stair training, Vestibular training, Canalith repositioning, Visual/preceptual remediation/compensation, Orthotic/Fit training, DME instructions, Dry Needling, Electrical stimulation, Wheelchair mobility training, Spinal mobilization, Cryotherapy, Moist heat, Splintting, Taping, Manual therapy, and Re-evaluation  PLAN FOR NEXT SESSION: complete further assessment: DGI, DVA, orthostatics, initiate interventions   Baird Kay, PT 08/27/2022, 5:54 PM

## 2022-08-28 NOTE — Telephone Encounter (Signed)
Received verbal consent from patient to fax form. Paperwork faxed and confirmation received at 4:47 08/28/2022. Original and copy given to MR

## 2022-08-28 NOTE — Telephone Encounter (Signed)
FMLA form signed by MM NP then sent to medical records for processing. Due today.

## 2022-09-03 ENCOUNTER — Ambulatory Visit: Payer: 59

## 2022-09-04 ENCOUNTER — Other Ambulatory Visit: Payer: Self-pay | Admitting: Psychiatry

## 2022-09-04 ENCOUNTER — Other Ambulatory Visit: Payer: Self-pay | Admitting: Neurology

## 2022-09-04 DIAGNOSIS — F3342 Major depressive disorder, recurrent, in full remission: Secondary | ICD-10-CM

## 2022-09-05 ENCOUNTER — Other Ambulatory Visit: Payer: Self-pay | Admitting: Neurology

## 2022-09-05 ENCOUNTER — Other Ambulatory Visit: Payer: Self-pay

## 2022-09-05 MED ORDER — BUPROPION HCL ER (SR) 150 MG PO TB12
150.0000 mg | ORAL_TABLET | Freq: Two times a day (BID) | ORAL | 0 refills | Status: DC
  Filled 2022-09-05: qty 180, 90d supply, fill #0

## 2022-09-05 MED ORDER — ESCITALOPRAM OXALATE 20 MG PO TABS
20.0000 mg | ORAL_TABLET | Freq: Every day | ORAL | 0 refills | Status: DC
  Filled 2022-09-05: qty 90, 90d supply, fill #0

## 2022-09-09 ENCOUNTER — Ambulatory Visit: Payer: Commercial Managed Care - PPO | Attending: Student

## 2022-09-09 ENCOUNTER — Other Ambulatory Visit: Payer: Self-pay

## 2022-09-09 DIAGNOSIS — R2681 Unsteadiness on feet: Secondary | ICD-10-CM | POA: Diagnosis not present

## 2022-09-09 DIAGNOSIS — R42 Dizziness and giddiness: Secondary | ICD-10-CM | POA: Diagnosis not present

## 2022-09-09 MED FILL — Propranolol HCl Cap ER 24HR 60 MG: ORAL | 30 days supply | Qty: 30 | Fill #0 | Status: AC

## 2022-09-09 NOTE — Therapy (Signed)
OUTPATIENT PHYSICAL THERAPY VESTIBULAR TREATMENT NOTE     Patient Name: Candice [name] MRN: [id number] DOB:Aug 22, 1986, 37 y.o., female Today's Date: 09/09/2022  END OF SESSION:  PT End of Session - 09/09/22 1733     Visit Number 2    Number of Visits 25    Date for PT Re-Evaluation 11/19/22    PT Start Time 7209    PT Stop Time 1728    PT Time Calculation (min) 45 min    Equipment Utilized During Treatment Gait belt    Activity Tolerance Patient tolerated treatment well;No increased pain    Behavior During Therapy WFL for tasks assessed/performed             Past Medical History:  Diagnosis Date   Anxiety    Depression    Hearing loss    Migraines    a. x 10+ years.   Postpartum care following cesarean delivery (5/29) 02/04/2014   Pre-syncope    a. 07/2015 [facility] ED visit, ? vasovagal, improved with IVF.   Short cervix in second trimester, antepartum 11/16/2013   Past Surgical History:  Procedure Laterality Date   CESAREAN SECTION N/A 02/03/2014   Procedure: Primary Cesarean Section Delivery Baby Girl @ 4709, Apgars 9/9;  Surgeon: Marvene Staff, MD;  Location: [facility];  Service: Obstetrics;  Laterality: N/A;   CESAREAN SECTION     WISDOM TOOTH EXTRACTION  2002   Patient Active Problem List   Diagnosis Date Noted   MDD (major depressive disorder), recurrent episode, mild ([place]) 10/30/2020   Insomnia due to mental condition 07/14/2019   Anxiety 05/13/2019   MDD (major depressive disorder), recurrent, in full remission ([place]) 04/08/2019   Panic attacks 04/08/2019   MDD (major depressive disorder), recurrent, in partial remission ([place]) 04/08/2019   Stress at home 12/19/2016   Muscle spasm 06/06/2016   Migraine without aura and with status migrainosus, not intractable 11/23/2015   Migraine without aura and without status migrainosus, not intractable 09/11/2015   Chronic migraine without aura without status migrainosus, not intractable 09/11/2015   Tachycardia  08/27/2015   Depression, major, recurrent, moderate ([place]) 02/19/2015   Postpartum care following cesarean delivery (5/29) 02/04/2014   Indication for care in labor or delivery 02/03/2014   Short cervix in second trimester, antepartum 11/16/2013   Antepartum bleeding, second trimester 11/15/2013    PCP: Gladstone Lighter, MD  REFERRING PROVIDER:  Willaim Rayas, PA      REFERRING DIAG:  R42 (ICD-10-CM) - Dizziness and giddiness      THERAPY DIAG:  Dizziness and giddiness  Unsteadiness on feet  ONSET DATE: 2021   Rationale for Evaluation and Treatment: Rehabilitation  SUBJECTIVE:   SUBJECTIVE STATEMENT: Pt reports she had 3-4 more days of decreased symptoms following maneuver provided at eval. She states symptoms weren't as bad or intense, but felt a "sloshing" in her head at times still. She reports after the 3-4 days her symptoms resolved. Pt reports hx of motion sickness (years).  Pt accompanied by: self  PERTINENT HISTORY:   Pt presents to PT eval due to dizziness. Pt reports onset in 2021 and that severity of her symptoms can vary. Initial episode was severe and occurred while driving after turning her head. She also experienced vomiting with this. The pt has since had mild episodes and one other severe episode in June or July with vomiting. She can become very unsteady when this happens.  Pt reports she has had symptoms as recent as yesterday while at  work. Her symptoms were triggered by turning her head in this instance. Pt takes Zofran or Meclizine at times due to symptoms severity. She reports unsteadiness at times but no falls. Pt reports she has had positive Dix-Hallpike 2x in the past and that she thinks treatment 1x did help her. Per chart and pt her recent VNG testing was normal.  Symptom description: can have vertigo, feelings of pressure/heaviness in the back of her head, pt can feel foggy headed at times, unsteady. Pt wears B hearing aids due to hearing  loss that occurred about 3-4 years ago, hearing has not worsened. She reports L side hearing is worse than R. She at times experiences ringing in each ear and also "cricket" type noises. Pt with hx of migraines but is unsure if she experiences headache symptoms with dizziness. She reports light sensitivity, denies sensitivity to noises.   Duration of vertigo: yesterday pt reports feelings of spinning lasted seconds-minutes, in severe cases pt reports she feels it has lasted hours, but is not entirely sure if this was a spinning sensation. Her symptoms/vertigo usually are triggered due to a change in position, sometimes she feels "floaty" and repots it is "not a full blown spin." Her symptoms can then intensify with movement even without moving her head.  Movements that trigger symptoms: laying down, turning her head, bending, pt unsure about tilting her head back  PMH includes: hearing loss, chronic migraine without auroa without status migrainosus, not intractable, anxiety, depression, panic attacks, pre-syncope, muscle spasm, insomnia per pt taking B12 supplement due to hx of low B12  PAIN:  Are you having pain? No  PRECAUTIONS: Fall  WEIGHT BEARING RESTRICTIONS: No  FALLS: Has patient fallen in last 6 months? No  LIVING ENVIRONMENT:    PLOF: Independent  PATIENT GOALS: improve symptoms, balance, pt reports symptoms affecting daily activities/work  OBJECTIVE:   DIAGNOSTIC FINDINGS:   VNG testing results per referral, [place], "Dix Hallpike maneuvers were negative. There was no spontaneous or gaze nystagmus. Random saccades, smooth pursuit visual tracking and optokinetic testing were normal. No post headshake nysgamus was present. No positional nystagmus was recorded. Calorics revealed no significant weakness. Impression: Normal VNG."  Most recent imaging per chart 06/13/2020 MR BRAIN W WO CONTRAST: "IMPRESSION: Unremarkable MRI of the brain.:  COGNITION: Overall cognitive status:  Within functional limits for tasks assessed    POSTURE:  No Significant postural limitations  Cervical ROM:   Pt observed to have WFL cervical rotation and extension during session that is pain-free. Formal measurements deferred  STRENGTH: deferred   BED MOBILITY: limited occ with symptom onset (laying down can be a potential trigger)  TRANSFERS: Assistive device utilized: None  Sit to stand: Complete Independence Stand to sit: Complete Independence Comments: pt reports mobility can become severely limited when pt has dizziness symptoms due to unsteadiness. However, none at present moment limiting mobility  GAIT: Gait pattern:  currently [facility], however, pt reports when she has dizziness onset she becomes very unsteady and must sit down or at times reach out to support herself Distance walked: clinic distances Assistive device utilized: None Level of assistance: Complete Independence   FUNCTIONAL TESTS: deferred DGI and M-CTSIB to be performed next 1-2 visits   PATIENT SURVEYS:  FOTO 51 (goal 57)  VESTIBULAR ASSESSMENT:  GENERAL OBSERVATION:    SYMPTOM BEHAVIOR:  Subjective history: Onset of dizziness and unsteadiness in 2021 with multiple mild episodes and 2 severe episodes (see above for details)  Non-Vestibular symptoms: tinnitus, nausea/vomiting, migraine symptoms,  and reports hx of syncopal episodes  Type of dizziness: Imbalance (Disequilibrium) and Spinning/Vertigo  Frequency: reports multiple episodes since 2021. Pt thinks can be positionally triggered but has at times worsened even when relatively still (not moving her head) but may be changing position  Duration: reports seconds and even hours at times  Aggravating factors:  positional changes  Relieving factors:  still/rest, rescue meds  OCULOMOTOR EXAM:  Ocular Alignment: normal  Ocular ROM: No Limitations  Spontaneous Nystagmus: absent  Gaze-Induced Nystagmus: absent  Smooth Pursuits: saccades, pt reports  she can at times feel when this occurs  Saccades: intact  Convergence/Divergence: WNL  VESTIBULAR - OCULAR REFLEX:   Slow VOR: Normal  Head-Impulse Test: WNL B   POSITIONAL TESTING:  Right Dix-Hallpike: negative Left Dix-Hallpike: Performed 2x for clarity. Initial test pt with delayed onset of extremely brief jerk nystagmus that appeared rotational and possibly downbeat. Once symptoms resolved and pt sat back up, a second Dix-Hallpike test performed, pt again with delayed onset of jerk nystagmus that was rotational and clearly up-beating to L. Pt reported dizziness with testing on L side as well.   OTHOSTATICS: deferred; indicated due to hx of syncopal episodes, to be performed next 1-2 visits  FUNCTIONAL GAIT: Dynamic Gait Index: deferred   VESTIBULAR TREATMENT:                                                                                                   DATE:    NMR- DVA- impairment - change from line 11 to 8; felt slightly off following test, requiring brief recovery interval   MCTSIB - able to complete all conditions except condition 4 (almost completes, singificant sway throughout, requiring UE support before 30 seconds).  DGI- 23/24   Orthostatics -  Supine 131/85 mmHg HR 103 bpm; no symptoms Seated 137/101 mmHg HR 111 bpm;  no symptoms Standing 131/103 mmHg HR 117 bpm; no symptoms Pt reports her BP readings can be higher at appointments. Will continue to monitor, did discuss monitoring at home to confirm diastolic pressures at home.  Seated VORx1 with horizontal head turns 1x30 sec - symptoms increased to 2-3/10, requires recovery interval. Pt then completes 1x10 seconds without symptoms remaining 1/10.  In corner, gait belt donned and CGA provided: Feet together, EC with vertical and horizontal head turns 30 sec of each On airex, feet together, EC with finger-touch on bar horizontal and vertical head turns 2x30 sec of each.  Pt dizziness/"off" symptoms 1/10 at end  of appointment. Provided instructions on 0-10 scale for symptom modulation with HEP (reviewed HEP) and instructed pt to discontinue if she feels it is triggering migraine symptoms.   PATIENT EDUCATION: Education details: encourages follow-up with neurologist if vertigo returns, indications of testing, plan, HEP Person educated: Patient Education method: Explanation and Handouts Education comprehension: verbalized understanding, returned demonstration, and needs further education  HOME EXERCISE PROGRAM:  09/09/22: Access Code: [email or web address] URL: [email or web address] Date: 09/09/2022 Prepared by: Ricard Dillon  Exercises - Seated Gaze Stabilization with Head Rotation  - 1 x daily - 7 x weekly - 1 sets - 3  reps - 10 seconds hold - Corner Balance Feet Together: Eyes Closed With Head Turns  - 1 x daily - 5 x weekly - 2 sets - 20 reps  GOALS: Goals reviewed with patient? No - goals to be reviewed next appointment  SHORT TERM GOALS: Target date: 10/21/2022   Patient will be independent in home exercise program to improve positionally triggered dizziness symptoms and balance to increase functional independence and safety with ADLs/job duties. Baseline: to be initiated (did provide and review symptom tracking tool/handout);  09/09/2022: initiated VORx1 seated horizontal head turns, and standing in corner EC with vertical and horizontal head turns Goal status: INITIAL   LONG TERM GOALS: Target date: 12/02/2022    Patient will increase FOTO score to equal to or greater than  57   to demonstrate statistically significant improvement in mobility and quality of life.  Baseline: 54 Goal status: INITIAL  2.  Patient will report at least a 50% reduction in positionally triggered dizziness and unsteadiness symptoms in order to increase safety with ADLs, job duties, and QOL Baseline: symptoms brought on by positional changes Goal status: INITIAL   3.  Patient will increase dynamic gait index  score to >19/24 as to demonstrate reduced fall risk and improved dynamic gait balance for better safety with community/home ambulation.   Baseline: to be completed next 1-2 visits; 09/09/2022: 23/24 Goal status: DISCONTINUED/MET pt achieves greater than 19/24 first attempt, only slight impairment in gait with horizontal head turns  ASSESSMENT:  CLINICAL IMPRESSION: Further testing completed on this date. Pt DGI WNL, exception of slight deviation in gait with horizontal head turns. Pt with impaired DVA testing, suggestive of possible gaze stability impairment. Pt exhibited no symptoms with orthostatic testing, no significant drop in BP, but did have high diastolic readings. Pt reports her BP can be high at appointments, will continue to monitor. Pt initiated HEP including VOR training. The pt will benefit from further skilled PT to improve positionally-triggered dizziness symptoms and imbalance in order to increase QOL and safety with ADLs.  OBJECTIVE IMPAIRMENTS: decreased activity tolerance, decreased balance, difficulty walking, and dizziness.   ACTIVITY LIMITATIONS: bending, stairs, bed mobility, and locomotion level  PARTICIPATION LIMITATIONS: cleaning, driving, shopping, community activity, occupation, yard work, and all ADLs can be severely affected/limited when pt having dizzy episode  PERSONAL FACTORS: Sex, Time since onset of injury/illness/exacerbation, and 3+ comorbidities: PMH includes: hearing loss, chronic migraine without auroa without status migrainosus, not intractable, anxiety, depression, panic attacks, pre-syncope, muscle spasm, insomnia per pt taking B12 supplement due to hx of low B12  are also affecting patient's functional outcome.   REHAB POTENTIAL: Good  CLINICAL DECISION MAKING: Evolving/moderate complexity  EVALUATION COMPLEXITY: Moderate   PLAN:  PT FREQUENCY: 2x/week  PT DURATION: 12 weeks  PLANNED INTERVENTIONS: Therapeutic exercises, Therapeutic activity,  Neuromuscular re-education, Balance training, Gait training, Patient/Family education, Self Care, Joint mobilization, Stair training, Vestibular training, Canalith repositioning, Visual/preceptual remediation/compensation, Orthotic/Fit training, DME instructions, Dry Needling, Electrical stimulation, Wheelchair mobility training, Spinal mobilization, Cryotherapy, Moist heat, Splintting, Taping, Manual therapy, and Re-evaluation  PLAN FOR NEXT SESSION: balance, compliant surface training, EC balance, VOR   Zollie Pee, PT 09/09/2022, 5:45 PM

## 2022-09-12 ENCOUNTER — Other Ambulatory Visit: Payer: Self-pay

## 2022-09-15 ENCOUNTER — Ambulatory Visit: Payer: Commercial Managed Care - PPO

## 2022-09-18 ENCOUNTER — Ambulatory Visit: Payer: Commercial Managed Care - PPO

## 2022-09-18 DIAGNOSIS — R42 Dizziness and giddiness: Secondary | ICD-10-CM | POA: Diagnosis not present

## 2022-09-18 DIAGNOSIS — R2681 Unsteadiness on feet: Secondary | ICD-10-CM | POA: Diagnosis not present

## 2022-09-18 NOTE — Therapy (Signed)
OUTPATIENT PHYSICAL THERAPY VESTIBULAR TREATMENT NOTE     Patient Name: Candice [name] MRN: [id number] DOB:1986-07-30, 37 y.o., female Today's Date: 09/18/2022  END OF SESSION:  PT End of Session - 09/18/22 1303     Visit Number 3    Number of Visits 25    Date for PT Re-Evaluation 11/19/22    PT Start Time 1302    PT Stop Time 1345    PT Time Calculation (min) 43 min    Equipment Utilized During Treatment Gait belt    Activity Tolerance Patient tolerated treatment well    Behavior During Therapy WFL for tasks assessed/performed             Past Medical History:  Diagnosis Date   Anxiety    Depression    Hearing loss    Migraines    a. x 10+ years.   Postpartum care following cesarean delivery (5/29) 02/04/2014   Pre-syncope    a. 07/2015 [facility] ED visit, ? vasovagal, improved with IVF.   Short cervix in second trimester, antepartum 11/16/2013   Past Surgical History:  Procedure Laterality Date   CESAREAN SECTION N/A 02/03/2014   Procedure: Primary Cesarean Section Delivery Baby Girl @ 1696, Apgars 9/9;  Surgeon: Marvene Staff, MD;  Location: [facility];  Service: Obstetrics;  Laterality: N/A;   CESAREAN SECTION     WISDOM TOOTH EXTRACTION  2002   Patient Active Problem List   Diagnosis Date Noted   MDD (major depressive disorder), recurrent episode, mild ([place]) 10/30/2020   Insomnia due to mental condition 07/14/2019   Anxiety 05/13/2019   MDD (major depressive disorder), recurrent, in full remission ([place]) 04/08/2019   Panic attacks 04/08/2019   MDD (major depressive disorder), recurrent, in partial remission ([place]) 04/08/2019   Stress at home 12/19/2016   Muscle spasm 06/06/2016   Migraine without aura and with status migrainosus, not intractable 11/23/2015   Migraine without aura and without status migrainosus, not intractable 09/11/2015   Chronic migraine without aura without status migrainosus, not intractable 09/11/2015   Tachycardia 08/27/2015    Depression, major, recurrent, moderate ([place]) 02/19/2015   Postpartum care following cesarean delivery (5/29) 02/04/2014   Indication for care in labor or delivery 02/03/2014   Short cervix in second trimester, antepartum 11/16/2013   Antepartum bleeding, second trimester 11/15/2013    PCP: Gladstone Lighter, MD  REFERRING PROVIDER:  Willaim Rayas, PA      REFERRING DIAG:  R42 (ICD-10-CM) - Dizziness and giddiness      THERAPY DIAG:  Dizziness and giddiness  Unsteadiness on feet  ONSET DATE: 2021   Rationale for Evaluation and Treatment: Rehabilitation  SUBJECTIVE:   SUBJECTIVE STATEMENT: Pt reports she did HEP for 4 days since previously seen. She woke up today with a headache and nausea. She reports evening following last session she had to lay down with an ice pack due to symptom increase. She reports this has not been a good week for  her regarding her headaches. Pt describes her symptoms like "a string attached to my head" pulling it back, and like "a bowling ball." Pt reports she still has experienced a floating-like sensation/swimmy-headed sensation, still feeling imbalance. She cannot attribute symptoms to a specific movement. She did have to wear sunglasses at work due to light sensitivity.   Pt accompanied by: self  PERTINENT HISTORY:   Pt presents to PT eval due to dizziness. Pt reports onset in 2021 and that severity of her symptoms can vary. Initial  episode was severe and occurred while driving after turning her head. She also experienced vomiting with this. The pt has since had mild episodes and one other severe episode in June or July with vomiting. She can become very unsteady when this happens.  Pt reports she has had symptoms as recent as yesterday while at work. Her symptoms were triggered by turning her head in this instance. Pt takes Zofran or Meclizine at times due to symptoms severity. She reports unsteadiness at times but no falls. Pt reports she  has had positive Dix-Hallpike 2x in the past and that she thinks treatment 1x did help her. Per chart and pt her recent VNG testing was normal.  Symptom description: can have vertigo, feelings of pressure/heaviness in the back of her head, pt can feel foggy headed at times, unsteady. Pt wears B hearing aids due to hearing loss that occurred about 3-4 years ago, hearing has not worsened. She reports L side hearing is worse than R. She at times experiences ringing in each ear and also "cricket" type noises. Pt with hx of migraines but is unsure if she experiences headache symptoms with dizziness. She reports light sensitivity, denies sensitivity to noises.   Duration of vertigo: yesterday pt reports feelings of spinning lasted seconds-minutes, in severe cases pt reports she feels it has lasted hours, but is not entirely sure if this was a spinning sensation. Her symptoms/vertigo usually are triggered due to a change in position, sometimes she feels "floaty" and repots it is "not a full blown spin." Her symptoms can then intensify with movement even without moving her head.  Movements that trigger symptoms: laying down, turning her head, bending, pt unsure about tilting her head back  PMH includes: hearing loss, chronic migraine without auroa without status migrainosus, not intractable, anxiety, depression, panic attacks, pre-syncope, muscle spasm, insomnia per pt taking B12 supplement due to hx of low B12  PAIN:  Are you having pain? No  PRECAUTIONS: Fall  WEIGHT BEARING RESTRICTIONS: No  FALLS: Has patient fallen in last 6 months? No  LIVING ENVIRONMENT:    PLOF: Independent  PATIENT GOALS: improve symptoms, balance, pt reports symptoms affecting daily activities/work  OBJECTIVE:   DIAGNOSTIC FINDINGS:   VNG testing results per referral, [place], "Dix Hallpike maneuvers were negative. There was no spontaneous or gaze nystagmus. Random saccades, smooth pursuit visual tracking and  optokinetic testing were normal. No post headshake nysgamus was present. No positional nystagmus was recorded. Calorics revealed no significant weakness. Impression: Normal VNG."  Most recent imaging per chart 06/13/2020 MR BRAIN W WO CONTRAST: "IMPRESSION: Unremarkable MRI of the brain.:  COGNITION: Overall cognitive status: Within functional limits for tasks assessed    POSTURE:  No Significant postural limitations  Cervical ROM:   Pt observed to have WFL cervical rotation and extension during session that is pain-free. Formal measurements deferred  STRENGTH: deferred   BED MOBILITY: limited occ with symptom onset (laying down can be a potential trigger)  TRANSFERS: Assistive device utilized: None  Sit to stand: Complete Independence Stand to sit: Complete Independence Comments: pt reports mobility can become severely limited when pt has dizziness symptoms due to unsteadiness. However, none at present moment limiting mobility  GAIT: Gait pattern:  currently [facility], however, pt reports when she has dizziness onset she becomes very unsteady and must sit down or at times reach out to support herself Distance walked: clinic distances Assistive device utilized: None Level of assistance: Complete Independence   FUNCTIONAL TESTS: deferred DGI and  M-CTSIB to be performed next 1-2 visits   PATIENT SURVEYS:  FOTO 51 (goal 57)  VESTIBULAR ASSESSMENT:  GENERAL OBSERVATION:    SYMPTOM BEHAVIOR:  Subjective history: Onset of dizziness and unsteadiness in 2021 with multiple mild episodes and 2 severe episodes (see above for details)  Non-Vestibular symptoms: tinnitus, nausea/vomiting, migraine symptoms, and reports hx of syncopal episodes  Type of dizziness: Imbalance (Disequilibrium) and Spinning/Vertigo  Frequency: reports multiple episodes since 2021. Pt thinks can be positionally triggered but has at times worsened even when relatively still (not moving her head) but may be  changing position  Duration: reports seconds and even hours at times  Aggravating factors:  positional changes  Relieving factors:  still/rest, rescue meds  OCULOMOTOR EXAM:  Ocular Alignment: normal  Ocular ROM: No Limitations  Spontaneous Nystagmus: absent  Gaze-Induced Nystagmus: absent  Smooth Pursuits: saccades, pt reports she can at times feel when this occurs  Saccades: intact  Convergence/Divergence: WNL  VESTIBULAR - OCULAR REFLEX:   Slow VOR: Normal  Head-Impulse Test: WNL B   POSITIONAL TESTING:  Right Dix-Hallpike: negative Left Dix-Hallpike: Performed 2x for clarity. Initial test pt with delayed onset of extremely brief jerk nystagmus that appeared rotational and possibly downbeat. Once symptoms resolved and pt sat back up, a second Dix-Hallpike test performed, pt again with delayed onset of jerk nystagmus that was rotational and clearly up-beating to L. Pt reported dizziness with testing on L side as well.   OTHOSTATICS: deferred; indicated due to hx of syncopal episodes, to be performed next 1-2 visits  FUNCTIONAL GAIT: Dynamic Gait Index: deferred   VESTIBULAR TREATMENT:                                                                                                   DATE:    TherEx:  Treadmill endurance training - pt ambulates up to 3.2 mph and up to 3% incline for a total of 6 minutes. Fatiguing    NMR: In // bars: Static stand on airex with EC, NBOS 3x30 sec - significant increase in sway and noted ankle righting but no LOB. Up to min a provided mainly for increased tactile input. Static stand in NBOS, EC with vertical and horizontal head turns 2x20 reps of each - increased sway but no LOB  Tandem gait length of // bars x multiple reps - challenging. Initial intermittent UE support Tandem gait on airex beam x multiple reps length of beam - increased unsteadiness but only 1-2 instances of grabbing onto bar for UE support  Static tandem stance on airex  beam 2x30 sec each LE. Steadier compared to gait on airex beam  Visual tracking red pball bending forward and reaching back 8x -- discontinued due to increased symptoms up to 3-4/10. Pt required seated rest break, reports feeling increased sweating/warm  Elsatogel donned across B shoulders x 5 min. No adverse reaction to treatment. Applied while the following was performed   LUE BP, seated, resting 137/102 mmHg, HR 108 bpm following   Encourages pt to monitor BP at home and discuss her BP with her physician at  her upcoming appointment  PATIENT EDUCATION: Education details: encourages follow-up with neurologist if vertigo returns, indications of testing, plan, HEP Person educated: Patient Education method: Explanation and Handouts Education comprehension: verbalized understanding, returned demonstration, and needs further education  HOME EXERCISE PROGRAM:  09/09/22: Access Code: [email or web address] URL: [email or web address] Date: 09/09/2022 Prepared by: Temple Pacini  Exercises - Seated Gaze Stabilization with Head Rotation  - 1 x daily - 7 x weekly - 1 sets - 3 reps - 10 seconds hold - Corner Balance Feet Together: Eyes Closed With Head Turns  - 1 x daily - 5 x weekly - 2 sets - 20 reps  GOALS: Goals reviewed with patient? No - goals to be reviewed next appointment  SHORT TERM GOALS: Target date: 10/30/2022   Patient will be independent in home exercise program to improve positionally triggered dizziness symptoms and balance to increase functional independence and safety with ADLs/job duties. Baseline: to be initiated (did provide and review symptom tracking tool/handout);  09/09/2022: initiated VORx1 seated horizontal head turns, and standing in corner EC with vertical and horizontal head turns Goal status: INITIAL   LONG TERM GOALS: Target date: 12/11/2022    Patient will increase FOTO score to equal to or greater than  57   to demonstrate statistically significant improvement in  mobility and quality of life.  Baseline: 54 Goal status: INITIAL  2.  Patient will report at least a 50% reduction in positionally triggered dizziness and unsteadiness symptoms in order to increase safety with ADLs, job duties, and QOL Baseline: symptoms brought on by positional changes Goal status: INITIAL   3.  Patient will increase dynamic gait index score to >19/24 as to demonstrate reduced fall risk and improved dynamic gait balance for better safety with community/home ambulation.   Baseline: to be completed next 1-2 visits; 09/09/2022: 23/24 Goal status: DISCONTINUED/MET pt achieves greater than 19/24 first attempt, only slight impairment in gait with horizontal head turns  ASSESSMENT:  CLINICAL IMPRESSION: Pt continues to exhibit high diastolic BP (taken at end of session following interventions). PT encouraged pt to monitor BP at home and instructed pt to bring up her BP at her upcoming doctor's appointment. Pt verbalized understanding. Throughout session pt did exhibit quick onset of fatigue with treadmill endurance training and showed significant sway and increased ankle righting with EC compliant surface training.  This did improve somewhat with reps within session as did tandem gait on both firm and compliant surfaces. The pt will benefit from further skilled PT to improve positionally-triggered dizziness symptoms and imbalance in order to increase QOL and safety with ADLs.  OBJECTIVE IMPAIRMENTS: decreased activity tolerance, decreased balance, difficulty walking, and dizziness.   ACTIVITY LIMITATIONS: bending, stairs, bed mobility, and locomotion level  PARTICIPATION LIMITATIONS: cleaning, driving, shopping, community activity, occupation, yard work, and all ADLs can be severely affected/limited when pt having dizzy episode  PERSONAL FACTORS: Sex, Time since onset of injury/illness/exacerbation, and 3+ comorbidities: PMH includes: hearing loss, chronic migraine without auroa  without status migrainosus, not intractable, anxiety, depression, panic attacks, pre-syncope, muscle spasm, insomnia per pt taking B12 supplement due to hx of low B12  are also affecting patient's functional outcome.   REHAB POTENTIAL: Good  CLINICAL DECISION MAKING: Evolving/moderate complexity  EVALUATION COMPLEXITY: Moderate   PLAN:  PT FREQUENCY: 2x/week  PT DURATION: 12 weeks  PLANNED INTERVENTIONS: Therapeutic exercises, Therapeutic activity, Neuromuscular re-education, Balance training, Gait training, Patient/Family education, Self Care, Joint mobilization, Stair training, Vestibular training, Canalith repositioning, Visual/preceptual remediation/compensation, Orthotic/Fit training, DME instructions,  Dry Needling, Electrical stimulation, Wheelchair mobility training, Spinal mobilization, Cryotherapy, Moist heat, Splintting, Taping, Manual therapy, and Re-evaluation  PLAN FOR NEXT SESSION: balance, compliant surface training, EC balance, VOR   Zollie Pee, PT 09/18/2022, 5:42 PM

## 2022-09-22 ENCOUNTER — Ambulatory Visit: Payer: Self-pay

## 2022-09-23 ENCOUNTER — Encounter: Payer: Self-pay | Admitting: Psychiatry

## 2022-09-23 ENCOUNTER — Other Ambulatory Visit: Payer: Self-pay

## 2022-09-23 ENCOUNTER — Ambulatory Visit: Payer: Commercial Managed Care - PPO | Admitting: Psychiatry

## 2022-09-23 VITALS — BP 127/84 | HR 78 | Temp 97.9°F | Ht 64.0 in | Wt 161.0 lb

## 2022-09-23 DIAGNOSIS — F5105 Insomnia due to other mental disorder: Secondary | ICD-10-CM | POA: Diagnosis not present

## 2022-09-23 DIAGNOSIS — F418 Other specified anxiety disorders: Secondary | ICD-10-CM | POA: Diagnosis not present

## 2022-09-23 DIAGNOSIS — F330 Major depressive disorder, recurrent, mild: Secondary | ICD-10-CM | POA: Diagnosis not present

## 2022-09-23 MED ORDER — ALPRAZOLAM 0.25 MG PO TABS
0.2500 mg | ORAL_TABLET | Freq: Every evening | ORAL | 0 refills | Status: DC | PRN
  Filled 2022-09-23: qty 15, 15d supply, fill #0

## 2022-09-23 MED ORDER — VENLAFAXINE HCL ER 37.5 MG PO CP24
37.5000 mg | ORAL_CAPSULE | Freq: Every day | ORAL | 1 refills | Status: DC
  Filled 2022-09-23: qty 30, 30d supply, fill #0

## 2022-09-23 MED ORDER — ESCITALOPRAM OXALATE 5 MG PO TABS
5.0000 mg | ORAL_TABLET | Freq: Every day | ORAL | 0 refills | Status: DC
  Filled 2022-09-23: qty 14, 14d supply, fill #0

## 2022-09-23 NOTE — Patient Instructions (Signed)
Venlafaxine Extended-Release Capsules What is this medication? VENLAFAXINE (VEN la fax een) treats depression and anxiety. It increases the amount of serotonin and norepinephrine in the brain, hormones that help regulate mood. It belongs to a group of medications called SNRIs. This medicine may be used for other purposes; ask your health care provider or pharmacist if you have questions. COMMON BRAND NAME(S): Effexor XR What should I tell my care team before I take this medication? They need to know if you have any of these conditions: Bleeding disorders Glaucoma Heart disease High blood pressure High cholesterol Kidney disease Liver disease Low levels of sodium in the blood Mania or bipolar disorder Seizures Suicidal thoughts, plans, or attempt; a previous suicide attempt by you or a family Take medications that treat or prevent blood clots Thyroid disease An unusual or allergic reaction to venlafaxine, desvenlafaxine, other medications, foods, dyes, or preservatives Pregnant or trying to get pregnant Breast-feeding How should I use this medication? Take this medication by mouth with a full glass of water. Follow the directions on the prescription label. Do not cut, crush, or chew this medication. Take it with food. If needed, the capsule may be carefully opened and the entire contents sprinkled on a spoonful of cool applesauce. Swallow the applesauce/pellet mixture right away without chewing and follow with a glass of water to ensure complete swallowing of the pellets. Try to take your medication at about the same time each day. Do not take your medication more often than directed. Do not stop taking this medication suddenly except upon the advice of your care team. Stopping this medication too quickly may cause serious side effects or your condition may worsen. A special MedGuide will be given to you by the pharmacist with each prescription and refill. Be sure to read this information  carefully each time. Talk to your care team regarding the use of this medication in children. Special care may be needed. Overdosage: If you think you have taken too much of this medicine contact a poison control center or emergency room at once. NOTE: This medicine is only for you. Do not share this medicine with others. What if I miss a dose? If you miss a dose, take it as soon as you can. If it is almost time for your next dose, take only that dose. Do not take double or extra doses. What may interact with this medication? Do not take this medication with any of the following: Certain medications for fungal infections like fluconazole, itraconazole, ketoconazole, posaconazole, voriconazole Cisapride Desvenlafaxine Dronedarone Duloxetine Levomilnacipran Linezolid MAOIs like Carbex, Eldepryl, Marplan, Nardil, and Parnate Methylene blue (injected into a vein) Milnacipran Pimozide Thioridazine This medication may also interact with the following: Amphetamines Aspirin and aspirin-like medications Certain medications for depression, anxiety, or psychotic disturbances Certain medications for migraine headaches like almotriptan, eletriptan, frovatriptan, naratriptan, rizatriptan, sumatriptan, zolmitriptan Certain medications for sleep Certain medications that treat or prevent blood clots like dalteparin, enoxaparin, warfarin Cimetidine Clozapine Diuretics Fentanyl Furazolidone Indinavir Isoniazid Lithium Metoprolol NSAIDS, medications for pain and inflammation, like ibuprofen or naproxen Other medications that prolong the QT interval (cause an abnormal heart rhythm) like dofetilide, ziprasidone Procarbazine Rasagiline Supplements like St. John's wort, kava kava, valerian Tramadol Tryptophan This list may not describe all possible interactions. Give your health care provider a list of all the medicines, herbs, non-prescription drugs, or dietary supplements you use. Also tell them  if you smoke, drink alcohol, or use illegal drugs. Some items may interact with your medicine. What should   I watch for while using this medication? Tell your care team if your symptoms do not get better or if they get worse. Visit your care team for regular checks on your progress. Because it may take several weeks to see the full effects of this medication, it is important to continue your treatment as prescribed by your care team. Watch for new or worsening thoughts of suicide or depression. This includes sudden changes in mood, behaviors, or thoughts. These changes can happen at any time but are more common in the beginning of treatment or after a change in dose. Call your care team right away if you experience these thoughts or worsening depression. Manic episodes may happen in patients with bipolar disorder who take this medication. Watch for changes in feelings or behaviors such as feeling anxious, nervous, agitated, panicky, irritable, hostile, aggressive, impulsive, severely restless, overly excited and hyperactive, or trouble sleeping. These changes can happen at any time but are more common in the beginning of treatment or after a change in dose. Call your care team right away if you notice any of these symptoms. This medication can cause an increase in blood pressure. Check with your care team for instructions on monitoring your blood pressure while taking this medication. You may get drowsy or dizzy. Do not drive, use machinery, or do anything that needs mental alertness until you know how this medication affects you. Do not stand or sit up quickly, especially if you are an older patient. This reduces the risk of dizzy or fainting spells. Do not drink alcohol while taking this medication. Drinking alcohol may alter the effects of your medication. Serious side effects may occur. Your mouth may get dry. Chewing sugarless gum, sucking hard candy and drinking plenty of water will help. Contact your  care team if the problem does not go away or is severe. What side effects may I notice from receiving this medication? Side effects that you should report to your care team as soon as possible: Allergic reactions--skin rash, itching, hives, swelling of the face, lips, tongue, or throat Bleeding--bloody or black, tar-like stools, red or dark brown urine, vomiting blood or brown material that looks like coffee grounds, small, red or purple spots on skin, unusual bleeding or bruising Heart rhythm changes--fast or irregular heartbeat, dizziness, feeling faint or lightheaded, chest pain, trouble breathing Increase in blood pressure Loss of appetite with weight loss Low sodium level--muscle weakness, fatigue, dizziness, headache, confusion Serotonin syndrome--irritability, confusion, fast or irregular heartbeat, muscle stiffness, twitching muscles, sweating, high fever, seizures, chills, vomiting, diarrhea Sudden eye pain or change in vision such as blurry vision, seeing halos around lights, vision loss Thoughts of suicide or self-harm, worsening mood, feelings of depression Side effects that usually do not require medical attention (report to your care team if they continue or are bothersome): Anxiety, nervousness Change in sex drive or performance Dizziness Dry mouth Excessive sweating Nausea Tremors or shaking Trouble sleeping This list may not describe all possible side effects. Call your doctor for medical advice about side effects. You may report side effects to FDA at 1-800-FDA-1088. Where should I keep my medication? Keep out of the reach of children and pets. Store at a controlled temperature between 20 and 25 degrees C (68 degrees and 77 degrees F), in a dry place. Throw away any unused medication after the expiration date. NOTE: This sheet is a summary. It may not cover all possible information. If you have questions about this medicine, talk to your doctor,   pharmacist, or health care  provider.  2023 Elsevier/Gold Standard (2007-10-16 00:00:00)  

## 2022-09-23 NOTE — Progress Notes (Signed)
[facility] OP Progress Note  09/23/2022 5:35 PM Candice [name]  MRN:  [id number]  Chief Complaint:  Chief Complaint  Patient presents with   Follow-up   Anxiety   Depression   HPI: Candice [name] is a 37 year old Caucasian female, employed, lives in [place], has a history of MDD, anxiety disorder, insomnia, bilateral hearing loss, migraine headaches, was evaluated in office today.  Patient today reports she continues to have multiple stressors.  Reports she had a stressful holiday season.  Reports several members in her family including herself got infected with COVID-19.  Also reports her grandmother is currently decompensating, has dementia.  Her mother is having a hard time taking care of her and has been calling her on a regular basis for support.  Patient also reports recent relationship struggles with her spouse.  Reports right before Christmas day she had a meltdown and had thoughts of not wanting to be here although she did not have a plan.  She reports she was able to sit down and rationalize and talk herself down.  She had a chance to talk to her parents at that time.  She also spoke to her friend.  All this helped.  Currently working on her relationship with her spouse.  Was able to reach out to her therapist and currently on a wait list.  Does have medical issues going on like vertigo/dizziness, currently working with physical therapist.  That also adds on to the stress since she feels this way 3-4 times a week.  Was noncompliant with Lexapro prior to all this for a few days however when she got back on it her symptoms got better.  However interested in tapering off Lexapro and trying another antidepressant.  Agreeable to a trial of Effexor.  May have tried it in the past however does not remember, reports she did not have side effects.  Currently compliant on the Wellbutrin.  Denies side effects.  The Wellbutrin does help.  Patient denies any suicidality, homicidality or  perceptual disturbances.  Patient denies any other concerns today.  Visit Diagnosis:    ICD-10-CM   1. MDD (major depressive disorder), recurrent episode, mild (HCC)  F33.0 escitalopram (LEXAPRO) 5 MG tablet    venlafaxine XR (EFFEXOR XR) 37.5 MG 24 hr capsule    2. Other specified anxiety disorders  F41.8 ALPRAZolam (XANAX) 0.25 MG tablet   with limited symptom attacks    3. Insomnia due to mental condition  F51.05    mood, pain      Past Psychiatric History: Reviewed psychiatric history from progress note on 01/10/2019.  Past Medical History:  Past Medical History:  Diagnosis Date   Anxiety    Depression    Hearing loss    Migraines    a. x 10+ years.   Postpartum care following cesarean delivery (5/29) 02/04/2014   Pre-syncope    a. 07/2015 [facility] ED visit, ? vasovagal, improved with IVF.   Short cervix in second trimester, antepartum 11/16/2013    Past Surgical History:  Procedure Laterality Date   CESAREAN SECTION N/A 02/03/2014   Procedure: Primary Cesarean Section Delivery Baby Girl @ 7026, Apgars 9/9;  Surgeon: Marvene Staff, MD;  Location: [facility];  Service: Obstetrics;  Laterality: N/A;   CESAREAN SECTION     WISDOM TOOTH EXTRACTION  2002    Family Psychiatric History: Reviewed family psychiatric history from progress note on 01/10/2019.  Family History:  Family History  Problem Relation Age of Onset  Depression Mother    Anxiety disorder Mother    Heart attack Mother        NSTEMI @ age 70.   Migraines Mother    Sleep apnea Father        alive @ 31.   Anxiety disorder Maternal Grandmother    Depression Maternal Grandmother    Diabetes Maternal Grandmother    Hypertension Maternal Grandmother    Heart attack Maternal Grandfather    Alcohol abuse Paternal Grandfather    Suicidality Paternal Grandfather     Social History: Reviewed social history from progress note on 01/10/2019. Social History   Socioeconomic History   Marital status: Married     Spouse name: Not on file   Number of children: 1   Years of education: Not on file   Highest education level: Not on file  Occupational History   Not on file  Tobacco Use   Smoking status: Never   Smokeless tobacco: Never  Vaping Use   Vaping Use: Never used  Substance and Sexual Activity   Alcohol use: Yes    Alcohol/week: 0.0 standard drinks of alcohol    Comment: maybe two drinks/month.   Drug use: No   Sexual activity: Yes    Birth control/protection: Pill  Other Topics Concern   Not on file  Social History Narrative   Lives in [place] with her husband and 56 month old dtr.  Does not routinely exercise.       Social Determinants of Health   Financial Resource Strain: Low Risk  (07/13/2018)   Overall Financial Resource Strain (CARDIA)    Difficulty of Paying Living Expenses: Not hard at all  Food Insecurity: No Food Insecurity (07/13/2018)   Hunger Vital Sign    Worried About Running Out of Food in the Last Year: Never true    Ran Out of Food in the Last Year: Never true  Transportation Needs: No Transportation Needs (07/13/2018)   PRAPARE - Administrator, Civil Service (Medical): No    Lack of Transportation (Non-Medical): No  Physical Activity: Inactive (07/13/2018)   Exercise Vital Sign    Days of Exercise per Week: 0 days    Minutes of Exercise per Session: 0 min  Stress: Not on file  Social Connections: Unknown (07/13/2018)   Social Connection and Isolation Panel [NHANES]    Frequency of Communication with Friends and Family: Not on file    Frequency of Social Gatherings with Friends and Family: Not on file    Attends Religious Services: Not on file    Active Member of Clubs or Organizations: Not on file    Attends Banker Meetings: Not on file    Marital Status: Married    Allergies:  Allergies  Allergen Reactions   Elemental Sulfur Hives   Sulfa Antibiotics Other (See Comments) and Hives    Metabolic Disorder Labs: No results  found for: "HGBA1C", "MPG" No results found for: "PROLACTIN" No results found for: "CHOL", "TRIG", "HDL", "CHOLHDL", "VLDL", "LDLCALC" Lab Results  Component Value Date   TSH 0.696 07/12/2015    Therapeutic Level Labs: No results found for: "LITHIUM" No results found for: "VALPROATE" No results found for: "CBMZ"  Current Medications: Current Outpatient Medications  Medication Sig Dispense Refill   buPROPion (WELLBUTRIN SR) 150 MG 12 hr tablet Take 1 tablet (150 mg total) by mouth 2 (two) times daily. Take daily at 8:30 AM and at 2 PM 180 tablet 0   cyanocobalamin (,VITAMIN B-12,)  1000 MCG/ML injection Inject 1 mL (1,000 mcg total) into the muscle monthly for 4 doses 1 mL 3   escitalopram (LEXAPRO) 5 MG tablet Take 1 tablet (5 mg total) by mouth daily for 14 days. Dose change , tapering off 14 tablet 0   levonorgestrel-ethinyl estradiol (NORDETTE) 0.15-30 MG-MCG tablet Take 1 tablet by mouth once daily. Take active pills ONLY 112 tablet 1   ondansetron (ZOFRAN-ODT) 8 MG disintegrating tablet Dissolve 1 tablet (8 mg total) in mouth every 8 (eight) hours as needed. 20 tablet 2   propranolol ER (INDERAL LA) 60 MG 24 hr capsule Take 1 capsule (60 mg total) by mouth at bedtime. 30 capsule 1   Rimegepant Sulfate (NURTEC) 75 MG TBDP Take 1 tablet by mouth daily as needed. 10 tablet 2   Tuberculin-Allergy Syringes (VANISHPOINT TUBERCULIN SYRINGE) 25G X 1" 1 ML MISC Use as directed for up to 30 days 1 each 3   Ubrogepant (UBRELVY) 100 MG TABS Take at the onset of migraine. Can repeat in 2 hours if needed. Only 2 tabs in 24 hours. 10 tablet 12   valACYclovir (VALTREX) 1000 MG tablet Take 2 tablets (2,000 mg total) by mouth 2 (two) times daily. Take for 2 days and monitor. 10 tablet 0   venlafaxine XR (EFFEXOR XR) 37.5 MG 24 hr capsule Take 1 capsule (37.5 mg total) by mouth daily with breakfast. 30 capsule 1   ALPRAZolam (XANAX) 0.25 MG tablet Take 1 tablet (0.25 mg total) by mouth at bedtime as  needed for anxiety. 15 tablet 0   promethazine (PHENERGAN) 25 MG tablet TAKE 1 TABLET BY MOUTH EVERY 8 (EIGHT) HOURS AS NEEDED FOR NAUSEA 30 tablet 0   No current facility-administered medications for this visit.     Musculoskeletal: Strength & Muscle Tone: within normal limits Gait & Station: normal Patient leans: N/A  Psychiatric Specialty Exam: Review of Systems  Neurological:  Positive for dizziness and headaches (Has a history of migraine-has headaches on and off.).  Psychiatric/Behavioral:  Positive for dysphoric mood. The patient is nervous/anxious.   All other systems reviewed and are negative.   Blood pressure 127/84, pulse 78, temperature 97.9 F (36.6 C), temperature source Oral, height 5\' 4"  (1.626 m), weight 161 lb (73 kg).Body mass index is 27.64 kg/m.  General Appearance: Casual  Eye Contact:  Fair  Speech:  Clear and Coherent  Volume:  Normal  Mood:  Anxious and Depressed  Affect:  Congruent  Thought Process:  Goal Directed and Descriptions of Associations: Intact  Orientation:  Full (Time, Place, and Person)  Thought Content: Logical   Suicidal Thoughts:  No  Homicidal Thoughts:  No  Memory:  Immediate;   Fair Recent;   Fair Remote;   Fair  Judgement:  Fair  Insight:  Fair  Psychomotor Activity:  Normal  Concentration:  Concentration: Fair and Attention Span: Fair  Recall:  [employer] of Knowledge: Fair  Language: Fair  Akathisia:  No  Handed:  Right  AIMS (if indicated): not done  Assets:  Communication Skills Desire for Improvement Housing Social Support  ADL's:  Intact  Cognition: WNL  Sleep:  Fair   Screenings: GAD-7    Flowsheet Row Office Visit from 09/23/2022 in [facility] Office Visit from 05/26/2022 in [facility] Office Visit from 02/19/2022 in [facility]  Total GAD-7 Score 5 4 5       PHQ2-9    [place] Office Visit from 09/23/2022 in  [facility] Office Visit from 05/26/2022 in [facility] Counselor from 05/13/2022 in [facility] [place] Office Visit from 02/19/2022 in [facility] Office Visit from 11/20/2021 in [facility]  PHQ-2 Total Score 2 0 1 1 0  PHQ-9 Total Score 5 2 -- -- 4      Flowsheet Row Office Visit from 09/23/2022 in [facility] Office Visit from 05/26/2022 in [facility] Counselor from 05/13/2022 in BE[facility] OUTPAT[facility]ERAP[facility] [place]  C-SSRS RISK CATEGORY Low Risk No Risk No Risk        Assessment and Plan: Candice [name] is a 37 year old Caucasian female, employed, married, has a history of depression, anxiety was evaluated in office today.  Patient with multiple psychosocial stressors, worsening mood symptoms, will benefit from the following plan.  Plan  MDD--unstable Taper off Lexapro.  Advised patient to start taking Lexapro 5 mg p.o. daily for the next 2 weeks and stop. Start venlafaxine extended release 37.5 mg p.o. daily with breakfast.  Provided medication education including including risk of withdrawal symptoms when tapering off this medication. Wellbutrin SR 150 mg p.o. twice daily  Other specified anxiety disorder with limited symptom attack-unstable Taper off Lexapro. Patient has reached out to her psychotherapist and is currently on a wait list, continue therapy with Ms. Christina Xanax 0.25 mg as needed for severe anxiety attacks Reviewed [place] PMP AWARxE  Insomnia-stable Continue sleep hygiene techniques  Follow-up in clinic in 4 weeks or sooner if needed.  This note was generated in part or whole with voice recognition software. Voice recognition is usually quite accurate but there are transcription errors that can and very often do occur. I apologize for any typographical  errors that were not detected and corrected.     Jomarie Longs, MD 09/24/2022, 11:06 AM

## 2022-09-24 ENCOUNTER — Ambulatory Visit: Payer: Commercial Managed Care - PPO

## 2022-09-24 NOTE — Therapy (Incomplete)
OUTPATIENT PHYSICAL THERAPY VESTIBULAR TREATMENT NOTE     Patient Name: EUPHEMIA LINGERFELT MRN: [id number] DOB:1986-07-30, 37 y.o., female Today's Date: 09/18/2022  END OF SESSION:  PT End of Session - 09/18/22 1303     Visit Number 3    Number of Visits 25    Date for PT Re-Evaluation 11/19/22    PT Start Time 1302    PT Stop Time 1345    PT Time Calculation (min) 43 min    Equipment Utilized During Treatment Gait belt    Activity Tolerance Patient tolerated treatment well    Behavior During Therapy WFL for tasks assessed/performed             Past Medical History:  Diagnosis Date   Anxiety    Depression    Hearing loss    Migraines    a. x 10+ years.   Postpartum care following cesarean delivery (5/29) 02/04/2014   Pre-syncope    a. 07/2015 [facility] ED visit, ? vasovagal, improved with IVF.   Short cervix in second trimester, antepartum 11/16/2013   Past Surgical History:  Procedure Laterality Date   CESAREAN SECTION N/A 02/03/2014   Procedure: Primary Cesarean Section Delivery Baby Girl @ 1696, Apgars 9/9;  Surgeon: Marvene Staff, MD;  Location: [facility];  Service: Obstetrics;  Laterality: N/A;   CESAREAN SECTION     WISDOM TOOTH EXTRACTION  2002   Patient Active Problem List   Diagnosis Date Noted   MDD (major depressive disorder), recurrent episode, mild ([place]) 10/30/2020   Insomnia due to mental condition 07/14/2019   Anxiety 05/13/2019   MDD (major depressive disorder), recurrent, in full remission ([place]) 04/08/2019   Panic attacks 04/08/2019   MDD (major depressive disorder), recurrent, in partial remission ([place]) 04/08/2019   Stress at home 12/19/2016   Muscle spasm 06/06/2016   Migraine without aura and with status migrainosus, not intractable 11/23/2015   Migraine without aura and without status migrainosus, not intractable 09/11/2015   Chronic migraine without aura without status migrainosus, not intractable 09/11/2015   Tachycardia 08/27/2015    Depression, major, recurrent, moderate ([place]) 02/19/2015   Postpartum care following cesarean delivery (5/29) 02/04/2014   Indication for care in labor or delivery 02/03/2014   Short cervix in second trimester, antepartum 11/16/2013   Antepartum bleeding, second trimester 11/15/2013    PCP: Gladstone Lighter, MD  REFERRING PROVIDER:  Willaim Rayas, PA      REFERRING DIAG:  R42 (ICD-10-CM) - Dizziness and giddiness      THERAPY DIAG:  Dizziness and giddiness  Unsteadiness on feet  ONSET DATE: 2021   Rationale for Evaluation and Treatment: Rehabilitation  SUBJECTIVE:   SUBJECTIVE STATEMENT: Pt reports she did HEP for 4 days since previously seen. She woke up today with a headache and nausea. She reports evening following last session she had to lay down with an ice pack due to symptom increase. She reports this has not been a good week for  her regarding her headaches. Pt describes her symptoms like "a string attached to my head" pulling it back, and like "a bowling ball." Pt reports she still has experienced a floating-like sensation/swimmy-headed sensation, still feeling imbalance. She cannot attribute symptoms to a specific movement. She did have to wear sunglasses at work due to light sensitivity.   Pt accompanied by: self  PERTINENT HISTORY:   Pt presents to PT eval due to dizziness. Pt reports onset in 2021 and that severity of her symptoms can vary. Initial  episode was severe and occurred while driving after turning her head. She also experienced vomiting with this. The pt has since had mild episodes and one other severe episode in June or July with vomiting. She can become very unsteady when this happens.  Pt reports she has had symptoms as recent as yesterday while at work. Her symptoms were triggered by turning her head in this instance. Pt takes Zofran or Meclizine at times due to symptoms severity. She reports unsteadiness at times but no falls. Pt reports she  has had positive Dix-Hallpike 2x in the past and that she thinks treatment 1x did help her. Per chart and pt her recent VNG testing was normal.  Symptom description: can have vertigo, feelings of pressure/heaviness in the back of her head, pt can feel foggy headed at times, unsteady. Pt wears B hearing aids due to hearing loss that occurred about 3-4 years ago, hearing has not worsened. She reports L side hearing is worse than R. She at times experiences ringing in each ear and also "cricket" type noises. Pt with hx of migraines but is unsure if she experiences headache symptoms with dizziness. She reports light sensitivity, denies sensitivity to noises.   Duration of vertigo: yesterday pt reports feelings of spinning lasted seconds-minutes, in severe cases pt reports she feels it has lasted hours, but is not entirely sure if this was a spinning sensation. Her symptoms/vertigo usually are triggered due to a change in position, sometimes she feels "floaty" and repots it is "not a full blown spin." Her symptoms can then intensify with movement even without moving her head.  Movements that trigger symptoms: laying down, turning her head, bending, pt unsure about tilting her head back  PMH includes: hearing loss, chronic migraine without auroa without status migrainosus, not intractable, anxiety, depression, panic attacks, pre-syncope, muscle spasm, insomnia per pt taking B12 supplement due to hx of low B12  PAIN:  Are you having pain? No  PRECAUTIONS: Fall  WEIGHT BEARING RESTRICTIONS: No  FALLS: Has patient fallen in last 6 months? No  LIVING ENVIRONMENT:    PLOF: Independent  PATIENT GOALS: improve symptoms, balance, pt reports symptoms affecting daily activities/work  OBJECTIVE:   DIAGNOSTIC FINDINGS:   VNG testing results per referral, [place], "Dix Hallpike maneuvers were negative. There was no spontaneous or gaze nystagmus. Random saccades, smooth pursuit visual tracking and  optokinetic testing were normal. No post headshake nysgamus was present. No positional nystagmus was recorded. Calorics revealed no significant weakness. Impression: Normal VNG."  Most recent imaging per chart 06/13/2020 MR BRAIN W WO CONTRAST: "IMPRESSION: Unremarkable MRI of the brain.:  COGNITION: Overall cognitive status: Within functional limits for tasks assessed    POSTURE:  No Significant postural limitations  Cervical ROM:   Pt observed to have WFL cervical rotation and extension during session that is pain-free. Formal measurements deferred  STRENGTH: deferred   BED MOBILITY: limited occ with symptom onset (laying down can be a potential trigger)  TRANSFERS: Assistive device utilized: None  Sit to stand: Complete Independence Stand to sit: Complete Independence Comments: pt reports mobility can become severely limited when pt has dizziness symptoms due to unsteadiness. However, none at present moment limiting mobility  GAIT: Gait pattern:  currently [facility], however, pt reports when she has dizziness onset she becomes very unsteady and must sit down or at times reach out to support herself Distance walked: clinic distances Assistive device utilized: None Level of assistance: Complete Independence   FUNCTIONAL TESTS: deferred DGI and  M-CTSIB to be performed next 1-2 visits   PATIENT SURVEYS:  FOTO 51 (goal 57)  VESTIBULAR ASSESSMENT:  GENERAL OBSERVATION:    SYMPTOM BEHAVIOR:  Subjective history: Onset of dizziness and unsteadiness in 2021 with multiple mild episodes and 2 severe episodes (see above for details)  Non-Vestibular symptoms: tinnitus, nausea/vomiting, migraine symptoms, and reports hx of syncopal episodes  Type of dizziness: Imbalance (Disequilibrium) and Spinning/Vertigo  Frequency: reports multiple episodes since 2021. Pt thinks can be positionally triggered but has at times worsened even when relatively still (not moving her head) but may be  changing position  Duration: reports seconds and even hours at times  Aggravating factors:  positional changes  Relieving factors:  still/rest, rescue meds  OCULOMOTOR EXAM:  Ocular Alignment: normal  Ocular ROM: No Limitations  Spontaneous Nystagmus: absent  Gaze-Induced Nystagmus: absent  Smooth Pursuits: saccades, pt reports she can at times feel when this occurs  Saccades: intact  Convergence/Divergence: WNL  VESTIBULAR - OCULAR REFLEX:   Slow VOR: Normal  Head-Impulse Test: WNL B   POSITIONAL TESTING:  Right Dix-Hallpike: negative Left Dix-Hallpike: Performed 2x for clarity. Initial test pt with delayed onset of extremely brief jerk nystagmus that appeared rotational and possibly downbeat. Once symptoms resolved and pt sat back up, a second Dix-Hallpike test performed, pt again with delayed onset of jerk nystagmus that was rotational and clearly up-beating to L. Pt reported dizziness with testing on L side as well.   OTHOSTATICS: deferred; indicated due to hx of syncopal episodes, to be performed next 1-2 visits  FUNCTIONAL GAIT: Dynamic Gait Index: deferred   VESTIBULAR TREATMENT:                                                                                                   DATE:   Repeat Dix-Hallpike testing (previously pos on L side)  Review BP tracking  VOR to tolerance (seated then standing)  EC on airex  Treadmill   Manual therapy   TherEx:  Treadmill endurance training - pt ambulates up to 3.2 mph and up to 3% incline for a total of 6 minutes. Fatiguing    NMR: In // bars: Static stand on airex with EC, NBOS 3x30 sec - significant increase in sway and noted ankle righting but no LOB. Up to min a provided mainly for increased tactile input. Static stand in NBOS, EC with vertical and horizontal head turns 2x20 reps of each - increased sway but no LOB  Tandem gait length of // bars x multiple reps - challenging. Initial intermittent UE support Tandem  gait on airex beam x multiple reps length of beam - increased unsteadiness but only 1-2 instances of grabbing onto bar for UE support  Static tandem stance on airex beam 2x30 sec each LE. [place] compared to gait on airex beam  Visual tracking red pball bending forward and reaching back 8x -- discontinued due to increased symptoms up to 3-4/10. Pt required seated rest break, reports feeling increased sweating/warm  Elsatogel donned across B shoulders x 5 min. No adverse reaction to treatment. Applied while the following was  performed   LUE BP, seated, resting 137/102 mmHg, HR 108 bpm following   Encourages pt to monitor BP at home and discuss her BP with her physician at her upcoming appointment  PATIENT EDUCATION: Education details: encourages follow-up with neurologist if vertigo returns, indications of testing, plan, HEP Person educated: Patient Education method: Explanation and Handouts Education comprehension: verbalized understanding, returned demonstration, and needs further education  HOME EXERCISE PROGRAM:  09/09/22: Access Code: [email or web address] URL: [email or web address] Date: 09/09/2022 Prepared by: Ricard Dillon  Exercises - Seated Gaze Stabilization with Head Rotation  - 1 x daily - 7 x weekly - 1 sets - 3 reps - 10 seconds hold - Corner Balance Feet Together: Eyes Closed With Head Turns  - 1 x daily - 5 x weekly - 2 sets - 20 reps  GOALS: Goals reviewed with patient? No - goals to be reviewed next appointment  SHORT TERM GOALS: Target date: 10/30/2022   Patient will be independent in home exercise program to improve positionally triggered dizziness symptoms and balance to increase functional independence and safety with ADLs/job duties. Baseline: to be initiated (did provide and review symptom tracking tool/handout);  09/09/2022: initiated VORx1 seated horizontal head turns, and standing in corner EC with vertical and horizontal head turns Goal status:  INITIAL   LONG TERM GOALS: Target date: 12/11/2022    Patient will increase FOTO score to equal to or greater than  57   to demonstrate statistically significant improvement in mobility and quality of life.  Baseline: 54 Goal status: INITIAL  2.  Patient will report at least a 50% reduction in positionally triggered dizziness and unsteadiness symptoms in order to increase safety with ADLs, job duties, and QOL Baseline: symptoms brought on by positional changes Goal status: INITIAL   3.  Patient will increase dynamic gait index score to >19/24 as to demonstrate reduced fall risk and improved dynamic gait balance for better safety with community/home ambulation.   Baseline: to be completed next 1-2 visits; 09/09/2022: 23/24 Goal status: DISCONTINUED/MET pt achieves greater than 19/24 first attempt, only slight impairment in gait with horizontal head turns  ASSESSMENT:  CLINICAL IMPRESSION: Pt continues to exhibit high diastolic BP (taken at end of session following interventions). PT encouraged pt to monitor BP at home and instructed pt to bring up her BP at her upcoming doctor's appointment. Pt verbalized understanding. Throughout session pt did exhibit quick onset of fatigue with treadmill endurance training and showed significant sway and increased ankle righting with EC compliant surface training.  This did improve somewhat with reps within session as did tandem gait on both firm and compliant surfaces. The pt will benefit from further skilled PT to improve positionally-triggered dizziness symptoms and imbalance in order to increase QOL and safety with ADLs.  OBJECTIVE IMPAIRMENTS: decreased activity tolerance, decreased balance, difficulty walking, and dizziness.   ACTIVITY LIMITATIONS: bending, stairs, bed mobility, and locomotion level  PARTICIPATION LIMITATIONS: cleaning, driving, shopping, community activity, occupation, yard work, and all ADLs can be severely affected/limited when pt  having dizzy episode  PERSONAL FACTORS: Sex, Time since onset of injury/illness/exacerbation, and 3+ comorbidities: PMH includes: hearing loss, chronic migraine without auroa without status migrainosus, not intractable, anxiety, depression, panic attacks, pre-syncope, muscle spasm, insomnia per pt taking B12 supplement due to hx of low B12  are also affecting patient's functional outcome.   REHAB POTENTIAL: Good  CLINICAL DECISION MAKING: Evolving/moderate complexity  EVALUATION COMPLEXITY: Moderate   PLAN:  PT FREQUENCY: 2x/week  PT DURATION: 12 weeks  PLANNED INTERVENTIONS: Therapeutic exercises, Therapeutic activity, Neuromuscular re-education, Balance training, Gait training, Patient/Family education, Self Care, Joint mobilization, Stair training, Vestibular training, Canalith repositioning, Visual/preceptual remediation/compensation, Orthotic/Fit training, DME instructions, Dry Needling, Electrical stimulation, Wheelchair mobility training, Spinal mobilization, Cryotherapy, Moist heat, Splintting, Taping, Manual therapy, and Re-evaluation  PLAN FOR NEXT SESSION: balance, compliant surface training, EC balance, VOR   Zollie Pee, PT 09/18/2022, 5:42 PM

## 2022-09-29 ENCOUNTER — Ambulatory Visit: Payer: Commercial Managed Care - PPO

## 2022-09-29 NOTE — Therapy (Incomplete)
OUTPATIENT PHYSICAL THERAPY VESTIBULAR TREATMENT NOTE     Patient Name: Candice [name] MRN: [id number] DOB:July 05, 1986, 37 y.o., female Today's Date: 09/29/2022  END OF SESSION:    Past Medical History:  Diagnosis Date   Anxiety    Depression    Hearing loss    Migraines    a. x 10+ years.   Postpartum care following cesarean delivery (5/29) 02/04/2014   Pre-syncope    a. 07/2015 [facility] ED visit, ? vasovagal, improved with IVF.   Short cervix in second trimester, antepartum 11/16/2013   Past Surgical History:  Procedure Laterality Date   CESAREAN SECTION N/A 02/03/2014   Procedure: Primary Cesarean Section Delivery Baby Girl @ [id number], Apgars 9/9;  Surgeon: Marvene Staff, MD;  Location: [facility];  Service: Obstetrics;  Laterality: N/A;   CESAREAN SECTION     WISDOM TOOTH EXTRACTION  2002   Patient Active Problem List   Diagnosis Date Noted   MDD (major depressive disorder), recurrent episode, mild ([place]) 10/30/2020   Insomnia due to mental condition 07/14/2019   Anxiety 05/13/2019   MDD (major depressive disorder), recurrent, in full remission ([place]) 04/08/2019   Panic attacks 04/08/2019   MDD (major depressive disorder), recurrent, in partial remission ([place]) 04/08/2019   Stress at home 12/19/2016   Muscle spasm 06/06/2016   Migraine without aura and with status migrainosus, not intractable 11/23/2015   Migraine without aura and without status migrainosus, not intractable 09/11/2015   Chronic migraine without aura without status migrainosus, not intractable 09/11/2015   Tachycardia 08/27/2015   Depression, major, recurrent, moderate ([place]) 02/19/2015   Postpartum care following cesarean delivery (5/29) 02/04/2014   Indication for care in labor or delivery 02/03/2014   Short cervix in second trimester, antepartum 11/16/2013   Antepartum bleeding, second trimester 11/15/2013    PCP: Gladstone Lighter, MD  REFERRING PROVIDER:  Willaim Rayas, PA       REFERRING DIAG:  R42 (ICD-10-CM) - Dizziness and giddiness      THERAPY DIAG:  No diagnosis found.  ONSET DATE: 2021   Rationale for Evaluation and Treatment: Rehabilitation  SUBJECTIVE:   SUBJECTIVE STATEMENT: Pt reports she did HEP for 4 days since previously seen. She woke up today with a headache and nausea. She reports evening following last session she had to lay down with an ice pack due to symptom increase. She reports this has not been a good week for  her regarding her headaches. Pt describes her symptoms like "a string attached to my head" pulling it back, and like "a bowling ball." Pt reports she still has experienced a floating-like sensation/swimmy-headed sensation, still feeling imbalance. She cannot attribute symptoms to a specific movement. She did have to wear sunglasses at work due to light sensitivity.   Pt accompanied by: self  PERTINENT HISTORY:   Pt presents to PT eval due to dizziness. Pt reports onset in 2021 and that severity of her symptoms can vary. Initial episode was severe and occurred while driving after turning her head. She also experienced vomiting with this. The pt has since had mild episodes and one other severe episode in June or July with vomiting. She can become very unsteady when this happens.  Pt reports she has had symptoms as recent as yesterday while at work. Her symptoms were triggered by turning her head in this instance. Pt takes Zofran or Meclizine at times due to symptoms severity. She reports unsteadiness at times but no falls. Pt reports she has had positive Dix-Hallpike  2x in the past and that she thinks treatment 1x did help her. Per chart and pt her recent VNG testing was normal.  Symptom description: can have vertigo, feelings of pressure/heaviness in the back of her head, pt can feel foggy headed at times, unsteady. Pt wears B hearing aids due to hearing loss that occurred about 3-4 years ago, hearing has not worsened. She reports  L side hearing is worse than R. She at times experiences ringing in each ear and also "cricket" type noises. Pt with hx of migraines but is unsure if she experiences headache symptoms with dizziness. She reports light sensitivity, denies sensitivity to noises.   Duration of vertigo: yesterday pt reports feelings of spinning lasted seconds-minutes, in severe cases pt reports she feels it has lasted hours, but is not entirely sure if this was a spinning sensation. Her symptoms/vertigo usually are triggered due to a change in position, sometimes she feels "floaty" and repots it is "not a full blown spin." Her symptoms can then intensify with movement even without moving her head.  Movements that trigger symptoms: laying down, turning her head, bending, pt unsure about tilting her head back  PMH includes: hearing loss, chronic migraine without auroa without status migrainosus, not intractable, anxiety, depression, panic attacks, pre-syncope, muscle spasm, insomnia per pt taking B12 supplement due to hx of low B12  PAIN:  Are you having pain? No  PRECAUTIONS: Fall  WEIGHT BEARING RESTRICTIONS: No  FALLS: Has patient fallen in last 6 months? No  LIVING ENVIRONMENT:    PLOF: Independent  PATIENT GOALS: improve symptoms, balance, pt reports symptoms affecting daily activities/work  OBJECTIVE:   DIAGNOSTIC FINDINGS:   VNG testing results per referral, [place], "Dix Hallpike maneuvers were negative. There was no spontaneous or gaze nystagmus. Random saccades, smooth pursuit visual tracking and optokinetic testing were normal. No post headshake nysgamus was present. No positional nystagmus was recorded. Calorics revealed no significant weakness. Impression: Normal VNG."  Most recent imaging per chart 06/13/2020 MR BRAIN W WO CONTRAST: "IMPRESSION: Unremarkable MRI of the brain.:  COGNITION: Overall cognitive status: Within functional limits for tasks assessed    POSTURE:  No Significant  postural limitations  Cervical ROM:   Pt observed to have WFL cervical rotation and extension during session that is pain-free. Formal measurements deferred  STRENGTH: deferred   BED MOBILITY: limited occ with symptom onset (laying down can be a potential trigger)  TRANSFERS: Assistive device utilized: None  Sit to stand: Complete Independence Stand to sit: Complete Independence Comments: pt reports mobility can become severely limited when pt has dizziness symptoms due to unsteadiness. However, none at present moment limiting mobility  GAIT: Gait pattern:  currently [facility], however, pt reports when she has dizziness onset she becomes very unsteady and must sit down or at times reach out to support herself Distance walked: clinic distances Assistive device utilized: None Level of assistance: Complete Independence   FUNCTIONAL TESTS: deferred DGI and M-CTSIB to be performed next 1-2 visits   PATIENT SURVEYS:  FOTO 51 (goal 57)  VESTIBULAR ASSESSMENT:  GENERAL OBSERVATION:    SYMPTOM BEHAVIOR:  Subjective history: Onset of dizziness and unsteadiness in 2021 with multiple mild episodes and 2 severe episodes (see above for details)  Non-Vestibular symptoms: tinnitus, nausea/vomiting, migraine symptoms, and reports hx of syncopal episodes  Type of dizziness: Imbalance (Disequilibrium) and Spinning/Vertigo  Frequency: reports multiple episodes since 2021. Pt thinks can be positionally triggered but has at times worsened even when relatively still (not moving  her head) but may be changing position  Duration: reports seconds and even hours at times  Aggravating factors:  positional changes  Relieving factors:  still/rest, rescue meds  OCULOMOTOR EXAM:  Ocular Alignment: normal  Ocular ROM: No Limitations  Spontaneous Nystagmus: absent  Gaze-Induced Nystagmus: absent  Smooth Pursuits: saccades, pt reports she can at times feel when this occurs  Saccades:  intact  Convergence/Divergence: WNL  VESTIBULAR - OCULAR REFLEX:   Slow VOR: Normal  Head-Impulse Test: WNL B   POSITIONAL TESTING:  Right Dix-Hallpike: negative Left Dix-Hallpike: Performed 2x for clarity. Initial test pt with delayed onset of extremely brief jerk nystagmus that appeared rotational and possibly downbeat. Once symptoms resolved and pt sat back up, a second Dix-Hallpike test performed, pt again with delayed onset of jerk nystagmus that was rotational and clearly up-beating to L. Pt reported dizziness with testing on L side as well.   OTHOSTATICS: deferred; indicated due to hx of syncopal episodes, to be performed next 1-2 visits  FUNCTIONAL GAIT: Dynamic Gait Index: deferred   VESTIBULAR TREATMENT:                                                                                                   DATE:   Repeat Dix-Hallpike testing (previously pos on L side)  Review BP tracking  VOR to tolerance (seated then standing)  EC on airex  Treadmill   Manual therapy   TherEx:  Treadmill endurance training - pt ambulates up to 3.2 mph and up to 3% incline for a total of 6 minutes. Fatiguing    NMR: In // bars: Static stand on airex with EC, NBOS 3x30 sec - significant increase in sway and noted ankle righting but no LOB. Up to min a provided mainly for increased tactile input. Static stand in NBOS, EC with vertical and horizontal head turns 2x20 reps of each - increased sway but no LOB  Tandem gait length of // bars x multiple reps - challenging. Initial intermittent UE support Tandem gait on airex beam x multiple reps length of beam - increased unsteadiness but only 1-2 instances of grabbing onto bar for UE support  Static tandem stance on airex beam 2x30 sec each LE. [place] compared to gait on airex beam  Visual tracking red pball bending forward and reaching back 8x -- discontinued due to increased symptoms up to 3-4/10. Pt required seated rest break, reports  feeling increased sweating/warm  Elsatogel donned across B shoulders x 5 min. No adverse reaction to treatment. Applied while the following was performed   LUE BP, seated, resting 137/102 mmHg, HR 108 bpm following   Encourages pt to monitor BP at home and discuss her BP with her physician at her upcoming appointment  PATIENT EDUCATION: Education details: encourages follow-up with neurologist if vertigo returns, indications of testing, plan, HEP Person educated: Patient Education method: Explanation and Handouts Education comprehension: verbalized understanding, returned demonstration, and needs further education  HOME EXERCISE PROGRAM:  09/09/22: Access Code: [email or web address] URL: [email or web address] Date: 09/09/2022 Prepared by: Ricard Dillon  Exercises - Seated Gaze Stabilization with Head Rotation  -  1 x daily - 7 x weekly - 1 sets - 3 reps - 10 seconds hold - Corner Balance Feet Together: Eyes Closed With Head Turns  - 1 x daily - 5 x weekly - 2 sets - 20 reps  GOALS: Goals reviewed with patient? No - goals to be reviewed next appointment  SHORT TERM GOALS: Target date: 11/10/2022   Patient will be independent in home exercise program to improve positionally triggered dizziness symptoms and balance to increase functional independence and safety with ADLs/job duties. Baseline: to be initiated (did provide and review symptom tracking tool/handout);  09/09/2022: initiated VORx1 seated horizontal head turns, and standing in corner EC with vertical and horizontal head turns Goal status: INITIAL   LONG TERM GOALS: Target date: 12/22/2022    Patient will increase FOTO score to equal to or greater than  57   to demonstrate statistically significant improvement in mobility and quality of life.  Baseline: 54 Goal status: INITIAL  2.  Patient will report at least a 50% reduction in positionally triggered dizziness and unsteadiness symptoms in order to increase safety with ADLs, job  duties, and QOL Baseline: symptoms brought on by positional changes Goal status: INITIAL   3.  Patient will increase dynamic gait index score to >19/24 as to demonstrate reduced fall risk and improved dynamic gait balance for better safety with community/home ambulation.   Baseline: to be completed next 1-2 visits; 09/09/2022: 23/24 Goal status: DISCONTINUED/MET pt achieves greater than 19/24 first attempt, only slight impairment in gait with horizontal head turns  ASSESSMENT:  CLINICAL IMPRESSION: Pt continues to exhibit high diastolic BP (taken at end of session following interventions). PT encouraged pt to monitor BP at home and instructed pt to bring up her BP at her upcoming doctor's appointment. Pt verbalized understanding. Throughout session pt did exhibit quick onset of fatigue with treadmill endurance training and showed significant sway and increased ankle righting with EC compliant surface training.  This did improve somewhat with reps within session as did tandem gait on both firm and compliant surfaces. The pt will benefit from further skilled PT to improve positionally-triggered dizziness symptoms and imbalance in order to increase QOL and safety with ADLs.  OBJECTIVE IMPAIRMENTS: decreased activity tolerance, decreased balance, difficulty walking, and dizziness.   ACTIVITY LIMITATIONS: bending, stairs, bed mobility, and locomotion level  PARTICIPATION LIMITATIONS: cleaning, driving, shopping, community activity, occupation, yard work, and all ADLs can be severely affected/limited when pt having dizzy episode  PERSONAL FACTORS: Sex, Time since onset of injury/illness/exacerbation, and 3+ comorbidities: PMH includes: hearing loss, chronic migraine without auroa without status migrainosus, not intractable, anxiety, depression, panic attacks, pre-syncope, muscle spasm, insomnia per pt taking B12 supplement due to hx of low B12  are also affecting patient's functional outcome.   REHAB  POTENTIAL: Good  CLINICAL DECISION MAKING: Evolving/moderate complexity  EVALUATION COMPLEXITY: Moderate   PLAN:  PT FREQUENCY: 2x/week  PT DURATION: 12 weeks  PLANNED INTERVENTIONS: Therapeutic exercises, Therapeutic activity, Neuromuscular re-education, Balance training, Gait training, Patient/Family education, Self Care, Joint mobilization, Stair training, Vestibular training, Canalith repositioning, Visual/preceptual remediation/compensation, Orthotic/Fit training, DME instructions, Dry Needling, Electrical stimulation, Wheelchair mobility training, Spinal mobilization, Cryotherapy, Moist heat, Splintting, Taping, Manual therapy, and Re-evaluation  PLAN FOR NEXT SESSION: balance, compliant surface training, EC balance, VOR   Zollie Pee, PT 09/29/2022, 11:18 AM

## 2022-10-02 ENCOUNTER — Ambulatory Visit: Payer: Commercial Managed Care - PPO

## 2022-10-03 DIAGNOSIS — F331 Major depressive disorder, recurrent, moderate: Secondary | ICD-10-CM | POA: Diagnosis not present

## 2022-10-03 DIAGNOSIS — Z0000 Encounter for general adult medical examination without abnormal findings: Secondary | ICD-10-CM | POA: Diagnosis not present

## 2022-10-03 DIAGNOSIS — G43119 Migraine with aura, intractable, without status migrainosus: Secondary | ICD-10-CM | POA: Diagnosis not present

## 2022-10-03 DIAGNOSIS — F411 Generalized anxiety disorder: Secondary | ICD-10-CM | POA: Diagnosis not present

## 2022-10-03 DIAGNOSIS — R42 Dizziness and giddiness: Secondary | ICD-10-CM | POA: Diagnosis not present

## 2022-10-03 DIAGNOSIS — E538 Deficiency of other specified B group vitamins: Secondary | ICD-10-CM | POA: Diagnosis not present

## 2022-10-03 DIAGNOSIS — F5105 Insomnia due to other mental disorder: Secondary | ICD-10-CM | POA: Diagnosis not present

## 2022-10-06 ENCOUNTER — Ambulatory Visit: Payer: Commercial Managed Care - PPO

## 2022-10-06 ENCOUNTER — Other Ambulatory Visit: Payer: Self-pay

## 2022-10-06 MED ORDER — ERGOCALCIFEROL 1.25 MG (50000 UT) PO CAPS
1.0000 | ORAL_CAPSULE | ORAL | 1 refills | Status: AC
  Filled 2022-10-06: qty 12, 84d supply, fill #0
  Filled 2023-01-14: qty 12, 84d supply, fill #1

## 2022-10-08 ENCOUNTER — Ambulatory Visit: Payer: Commercial Managed Care - PPO

## 2022-10-08 ENCOUNTER — Telehealth: Payer: Self-pay | Admitting: Adult Health

## 2022-10-08 ENCOUNTER — Other Ambulatory Visit: Payer: Self-pay

## 2022-10-08 DIAGNOSIS — R42 Dizziness and giddiness: Secondary | ICD-10-CM

## 2022-10-08 DIAGNOSIS — R2681 Unsteadiness on feet: Secondary | ICD-10-CM | POA: Diagnosis not present

## 2022-10-08 MED ORDER — CYANOCOBALAMIN 1000 MCG/ML IJ SOLN
1000.0000 ug | INTRAMUSCULAR | 5 refills | Status: AC
  Filled 2022-10-08: qty 1, 30d supply, fill #0
  Filled 2022-11-10: qty 1, 30d supply, fill #1
  Filled 2022-12-11: qty 1, 30d supply, fill #2
  Filled 2023-01-14: qty 1, 30d supply, fill #3
  Filled 2023-02-25: qty 1, 30d supply, fill #4

## 2022-10-08 MED ORDER — "VANISHPOINT TUBERCULIN SYRINGE 25G X 1"" 1 ML MISC"
5 refills | Status: DC
  Filled 2022-11-10 – 2023-02-25 (×3): qty 1, fill #0

## 2022-10-08 NOTE — Therapy (Signed)
OUTPATIENT PHYSICAL THERAPY VESTIBULAR TREATMENT NOTE     Patient Name: Candice [name] MRN: [id number] DOB:[birth year], 37 y.o., female Today's Date: 10/08/2022  END OF SESSION:  PT End of Session - 10/08/22 1447     Visit Number 4    Number of Visits 25    Date for PT Re-Evaluation 11/19/22    PT Start Time 1348    PT Stop Time 1428    PT Time Calculation (min) 40 min    Equipment Utilized During Treatment Gait belt    Activity Tolerance Patient tolerated treatment well    Behavior During Therapy WFL for tasks assessed/performed              Past Medical History:  Diagnosis Date   Anxiety    Depression    Hearing loss    Migraines    a. x 10+ years.   Postpartum care following cesarean delivery (5/29) 02/04/2014   Pre-syncope    a. 07/2015 [facility] ED visit, ? vasovagal, improved with IVF.   Short cervix in second trimester, antepartum 11/16/2013   Past Surgical History:  Procedure Laterality Date   CESAREAN SECTION N/A 02/03/2014   Procedure: Primary Cesarean Section Delivery Baby Girl @ 2244, Apgars 9/9;  Surgeon: Serita Kyle, MD;  Location: [facility];  Service: Obstetrics;  Laterality: N/A;   CESAREAN SECTION     WISDOM TOOTH EXTRACTION  2002   Patient Active Problem List   Diagnosis Date Noted   MDD (major depressive disorder), recurrent episode, mild (HCC) 10/30/2020   Insomnia due to mental condition 07/14/2019   Anxiety 05/13/2019   MDD (major depressive disorder), recurrent, in full remission (HCC) 04/08/2019   Panic attacks 04/08/2019   MDD (major depressive disorder), recurrent, in partial remission (HCC) 04/08/2019   Stress at home 12/19/2016   Muscle spasm 06/06/2016   Migraine without aura and with status migrainosus, not intractable 11/23/2015   Migraine without aura and without status migrainosus, not intractable 09/11/2015   Chronic migraine without aura without status migrainosus, not intractable 09/11/2015   Tachycardia 08/27/2015    Depression, major, recurrent, moderate (HCC) 02/19/2015   Postpartum care following cesarean delivery (5/29) 02/04/2014   Indication for care in labor or delivery 02/03/2014   Short cervix in second trimester, antepartum 11/16/2013   Antepartum bleeding, second trimester 11/15/2013    PCP: Enid Baas, MD  REFERRING PROVIDER:  Gigi Gin, PA      REFERRING DIAG:  R42 (ICD-10-CM) - Dizziness and giddiness      THERAPY DIAG:  Dizziness and giddiness  ONSET DATE: 2021   Rationale for Evaluation and Treatment: Rehabilitation  SUBJECTIVE:   SUBJECTIVE STATEMENT: Pt reports she recently saw her physician and is going to start B12 injections and vitamin D supplement. Reports her diastolic BP found to be WNL at recent dr appointment. Pt reports she also had elevated inflammation markers with recent testing. Pt has not been sleeping well, recently had death in her family.   Pt reports no full-blown vertigo recently, has had heart palpitations and a floating feeling. She also noted difficulty with turning to check her blind-spot in the car. She reports this triggered a swimmy-headed feeling.  Pt accompanied by: self  PERTINENT HISTORY:   Pt presents to PT eval due to dizziness. Pt reports onset in 2021 and that severity of her symptoms can vary. Initial episode was severe and occurred while driving after turning her head. She also experienced vomiting with this. The pt has since  had mild episodes and one other severe episode in June or July with vomiting. She can become very unsteady when this happens.  Pt reports she has had symptoms as recent as yesterday while at work. Her symptoms were triggered by turning her head in this instance. Pt takes Zofran or Meclizine at times due to symptoms severity. She reports unsteadiness at times but no falls. Pt reports she has had positive Dix-Hallpike 2x in the past and that she thinks treatment 1x did help her. Per chart and pt her  recent VNG testing was normal.  Symptom description: can have vertigo, feelings of pressure/heaviness in the back of her head, pt can feel foggy headed at times, unsteady. Pt wears B hearing aids due to hearing loss that occurred about 3-4 years ago, hearing has not worsened. She reports L side hearing is worse than R. She at times experiences ringing in each ear and also "cricket" type noises. Pt with hx of migraines but is unsure if she experiences headache symptoms with dizziness. She reports light sensitivity, denies sensitivity to noises.   Duration of vertigo: yesterday pt reports feelings of spinning lasted seconds-minutes, in severe cases pt reports she feels it has lasted hours, but is not entirely sure if this was a spinning sensation. Her symptoms/vertigo usually are triggered due to a change in position, sometimes she feels "floaty" and repots it is "not a full blown spin." Her symptoms can then intensify with movement even without moving her head.  Movements that trigger symptoms: laying down, turning her head, bending, pt unsure about tilting her head back  PMH includes: hearing loss, chronic migraine without auroa without status migrainosus, not intractable, anxiety, depression, panic attacks, pre-syncope, muscle spasm, insomnia per pt taking B12 supplement due to hx of low B12  PAIN:  Are you having pain? No  PRECAUTIONS: Fall  WEIGHT BEARING RESTRICTIONS: No  FALLS: Has patient fallen in last 6 months? No  LIVING ENVIRONMENT:    PLOF: Independent  PATIENT GOALS: improve symptoms, balance, pt reports symptoms affecting daily activities/work  OBJECTIVE:   DIAGNOSTIC FINDINGS:   VNG testing results per referral, [place], "Dix Hallpike maneuvers were negative. There was no spontaneous or gaze nystagmus. Random saccades, smooth pursuit visual tracking and optokinetic testing were normal. No post headshake nysgamus was present. No positional nystagmus was recorded. Calorics  revealed no significant weakness. Impression: Normal VNG."  Most recent imaging per chart 06/13/2020 MR BRAIN W WO CONTRAST: "IMPRESSION: Unremarkable MRI of the brain.:  COGNITION: Overall cognitive status: Within functional limits for tasks assessed    POSTURE:  No Significant postural limitations  Cervical ROM:   Pt observed to have WFL cervical rotation and extension during session that is pain-free. Formal measurements deferred  STRENGTH: deferred   BED MOBILITY: limited occ with symptom onset (laying down can be a potential trigger)  TRANSFERS: Assistive device utilized: None  Sit to stand: Complete Independence Stand to sit: Complete Independence Comments: pt reports mobility can become severely limited when pt has dizziness symptoms due to unsteadiness. However, none at present moment limiting mobility  GAIT: Gait pattern:  currently [facility] however, pt reports when she has dizziness onset she becomes very unsteady and must sit down or at times reach out to support herself Distance walked: clinic distances Assistive device utilized: None Level of assistance: Complete Independence   FUNCTIONAL TESTS: deferred DGI and M-CTSIB to be performed next 1-2 visits   PATIENT SURVEYS:  FOTO 51 (goal 57)  VESTIBULAR ASSESSMENT:  GENERAL  OBSERVATION:    SYMPTOM BEHAVIOR:  Subjective history: Onset of dizziness and unsteadiness in 2021 with multiple mild episodes and 2 severe episodes (see above for details)  Non-Vestibular symptoms: tinnitus, nausea/vomiting, migraine symptoms, and reports hx of syncopal episodes  Type of dizziness: Imbalance (Disequilibrium) and Spinning/Vertigo  Frequency: reports multiple episodes since 2021. Pt thinks can be positionally triggered but has at times worsened even when relatively still (not moving her head) but may be changing position  Duration: reports seconds and even hours at times  Aggravating factors:  positional changes  Relieving  factors:  still/rest, rescue meds  OCULOMOTOR EXAM:  Ocular Alignment: normal  Ocular ROM: No Limitations  Spontaneous Nystagmus: absent  Gaze-Induced Nystagmus: absent  Smooth Pursuits: saccades, pt reports she can at times feel when this occurs  Saccades: intact  Convergence/Divergence: WNL  VESTIBULAR - OCULAR REFLEX:   Slow VOR: Normal  Head-Impulse Test: WNL B   POSITIONAL TESTING:  Right Dix-Hallpike: negative Left Dix-Hallpike: Performed 2x for clarity. Initial test pt with delayed onset of extremely brief jerk nystagmus that appeared rotational and possibly downbeat. Once symptoms resolved and pt sat back up, a second Dix-Hallpike test performed, pt again with delayed onset of jerk nystagmus that was rotational and clearly up-beating to L. Pt reported dizziness with testing on L side as well.   OTHOSTATICS: deferred; indicated due to hx of syncopal episodes, to be performed next 1-2 visits  FUNCTIONAL GAIT: Dynamic Gait Index: deferred   VESTIBULAR TREATMENT:                                                                                                   DATE:   NMR: Dix-Hallpike reassessment. Pt with negative test bilaterally. No dizziness reported and no nystagmus observed   Standing, firm surface, VORx1 with vertical and horizontal head turns, plain background 2x30 sec for each. With last set pt reports increase in symptoms to 2/10. Takes recovery interval (therex, below, cervical stretching)  Standing EC on airex 30 sec. Increased sway but no LOB  Manual: Pt seated, STM and TrP release provided x 6 min total. Focus along L UT, where pt with 3 TrPs in this region. No TrPs throughout R side, but did note stiffness feeling. Cued pt through lateral flex stretch during STM.  TherEx:  Cervical ROM assessment: Lat flex: 30 deg L, 40 deg R feelings of tightness throughout Cervical rotation: 85 deg bilat   Seated lateral cervical flexion stretch 30 sec each  side  Nustep interval training, cuing to increase speed until pt feels she has reached a moderate challenge Lvl 1 x 1 minute 30 sec  Lvl 2 x 1 minute Lvl 3 x 1 minute Lvl 4 x 1 minute  Lvl 2 x 1 minute      PATIENT EDUCATION: Education details: positional assessment, indications, exercise technique Person educated: Patient Education method: Explanation and Handouts Education comprehension: verbalized understanding, returned demonstration, and needs further education  HOME EXERCISE PROGRAM:  09/09/22: Access Code: [email or web address] URL: [email or web address] Date: 09/09/2022 Prepared by: Ricard Dillon  Exercises - Seated Gaze Stabilization with Head  Rotation  - 1 x daily - 7 x weekly - 1 sets - 3 reps - 10 seconds hold - Corner Balance Feet Together: Eyes Closed With Head Turns  - 1 x daily - 5 x weekly - 2 sets - 20 reps  GOALS: Goals reviewed with patient? No - goals to be reviewed next appointment  SHORT TERM GOALS: Target date: 11/19/2022   Patient will be independent in home exercise program to improve positionally triggered dizziness symptoms and balance to increase functional independence and safety with ADLs/job duties. Baseline: to be initiated (did provide and review symptom tracking tool/handout);  09/09/2022: initiated VORx1 seated horizontal head turns, and standing in corner EC with vertical and horizontal head turns Goal status: INITIAL   LONG TERM GOALS: Target date: 12/31/2022    Patient will increase FOTO score to equal to or greater than  57   to demonstrate statistically significant improvement in mobility and quality of life.  Baseline: 54 Goal status: INITIAL  2.  Patient will report at least a 50% reduction in positionally triggered dizziness and unsteadiness symptoms in order to increase safety with ADLs, job duties, and QOL Baseline: symptoms brought on by positional changes Goal status: INITIAL   3.  Patient will increase dynamic gait index  score to >19/24 as to demonstrate reduced fall risk and improved dynamic gait balance for better safety with community/home ambulation.   Baseline: to be completed next 1-2 visits; 09/09/2022: 23/24 Goal status: DISCONTINUED/MET pt achieves greater than 19/24 first attempt, only slight impairment in gait with horizontal head turns  ASSESSMENT:  CLINICAL IMPRESSION: Pt returns after absence from PT due to death in the family. Positional testing reassessed and pt found to have negative DH bilaterally. Pt also able to perform EC compliant surface static stand without LOB. While pt shows progress, she still reports a swimmy-headed sensation that reached 2/10 with VORx1. Pt also recently saw her physician and reports she is going to start B12 injections and vitamin D supplement. The pt will benefit from further skilled PT to improve positionally-triggered dizziness symptoms and imbalance in order to increase QOL and safety with ADLs.  OBJECTIVE IMPAIRMENTS: decreased activity tolerance, decreased balance, difficulty walking, and dizziness.   ACTIVITY LIMITATIONS: bending, stairs, bed mobility, and locomotion level  PARTICIPATION LIMITATIONS: cleaning, driving, shopping, community activity, occupation, yard work, and all ADLs can be severely affected/limited when pt having dizzy episode  PERSONAL FACTORS: Sex, Time since onset of injury/illness/exacerbation, and 3+ comorbidities: PMH includes: hearing loss, chronic migraine without auroa without status migrainosus, not intractable, anxiety, depression, panic attacks, pre-syncope, muscle spasm, insomnia per pt taking B12 supplement due to hx of low B12  are also affecting patient's functional outcome.   REHAB POTENTIAL: Good  CLINICAL DECISION MAKING: Evolving/moderate complexity  EVALUATION COMPLEXITY: Moderate   PLAN:  PT FREQUENCY: 2x/week  PT DURATION: 12 weeks  PLANNED INTERVENTIONS: Therapeutic exercises, Therapeutic activity,  Neuromuscular re-education, Balance training, Gait training, Patient/Family education, Self Care, Joint mobilization, Stair training, Vestibular training, Canalith repositioning, Visual/preceptual remediation/compensation, Orthotic/Fit training, DME instructions, Dry Needling, Electrical stimulation, Wheelchair mobility training, Spinal mobilization, Cryotherapy, Moist heat, Splintting, Taping, Manual therapy, and Re-evaluation  PLAN FOR NEXT SESSION: balance, compliant surface training, EC balance, VOR   Zollie Pee, PT 10/08/2022, 2:50 PM

## 2022-10-08 NOTE — Telephone Encounter (Signed)
Please start botox PA with new [employer]

## 2022-10-09 NOTE — Telephone Encounter (Signed)
Chronic Migraine CPT 64615  Botox [id number] Units:200  G43.709 Chronic Migraine without aura, not intractable, without status migrainous  Appt on 10/30/22

## 2022-10-13 ENCOUNTER — Ambulatory Visit: Payer: Commercial Managed Care - PPO

## 2022-10-15 ENCOUNTER — Ambulatory Visit: Payer: Commercial Managed Care - PPO | Attending: Student

## 2022-10-15 DIAGNOSIS — R2681 Unsteadiness on feet: Secondary | ICD-10-CM | POA: Insufficient documentation

## 2022-10-15 DIAGNOSIS — R42 Dizziness and giddiness: Secondary | ICD-10-CM | POA: Diagnosis not present

## 2022-10-15 NOTE — Telephone Encounter (Signed)
Submitted a Prior Authorization request to [employer] for  Botox 200  via CoverMyMeds. Will update once we receive a response.  Key: [id number]  Submitting updated Botox One

## 2022-10-15 NOTE — Telephone Encounter (Signed)
Buy/Bill patient- submit through Medical Benefit

## 2022-10-15 NOTE — Therapy (Signed)
OUTPATIENT PHYSICAL THERAPY VESTIBULAR TREATMENT NOTE     Patient Name: Candice [name] MRN: [id number] DOB:06/28/1986, 37 y.o., female Today's Date: 10/15/2022  END OF SESSION:  PT End of Session - 10/15/22 1106     Visit Number 5    Number of Visits 25    Date for PT Re-Evaluation 11/19/22    Authorization Type [employer]    Authorization Time Period 08/27/22-11/19/22    PT Start Time 1100    PT Stop Time 1140    PT Time Calculation (min) 40 min    Activity Tolerance Patient tolerated treatment well    Behavior During Therapy WFL for tasks assessed/performed              Past Medical History:  Diagnosis Date   Anxiety    Depression    Hearing loss    Migraines    a. x 10+ years.   Postpartum care following cesarean delivery (5/29) 02/04/2014   Pre-syncope    a. 07/2015 [facility] ED visit, ? vasovagal, improved with IVF.   Short cervix in second trimester, antepartum 11/16/2013   Past Surgical History:  Procedure Laterality Date   CESAREAN SECTION N/A 02/03/2014   Procedure: Primary Cesarean Section Delivery Baby Girl @ 9371, Apgars 9/9;  Surgeon: Marvene Staff, MD;  Location: [facility];  Service: Obstetrics;  Laterality: N/A;   CESAREAN SECTION     WISDOM TOOTH EXTRACTION  2002   Patient Active Problem List   Diagnosis Date Noted   MDD (major depressive disorder), recurrent episode, mild ([place]) 10/30/2020   Insomnia due to mental condition 07/14/2019   Anxiety 05/13/2019   MDD (major depressive disorder), recurrent, in full remission ([place]) 04/08/2019   Panic attacks 04/08/2019   MDD (major depressive disorder), recurrent, in partial remission ([place]) 04/08/2019   Stress at home 12/19/2016   Muscle spasm 06/06/2016   Migraine without aura and with status migrainosus, not intractable 11/23/2015   Migraine without aura and without status migrainosus, not intractable 09/11/2015   Chronic migraine without aura without status migrainosus, not intractable  09/11/2015   Tachycardia 08/27/2015   Depression, major, recurrent, moderate ([place]) 02/19/2015   Postpartum care following cesarean delivery (5/29) 02/04/2014   Indication for care in labor or delivery 02/03/2014   Short cervix in second trimester, antepartum 11/16/2013   Antepartum bleeding, second trimester 11/15/2013    PCP: Gladstone Lighter, MD  REFERRING PROVIDER:  Willaim Rayas, PA      REFERRING DIAG:  R42 (ICD-10-CM) - Dizziness and giddiness      THERAPY DIAG:  Dizziness and giddiness  Unsteadiness on feet  ONSET DATE: 2021   Rationale for Evaluation and Treatment: Rehabilitation  SUBJECTIVE:   SUBJECTIVE STATEMENT: Pt reports she recently saw her physician and is going to start B12 injections and vitamin D supplement. Reports her diastolic BP found to be WNL at recent dr appointment. Pt reports she also had elevated inflammation markers with recent testing. Pt has not been sleeping well, recently had death in her family.   Pt reports no full-blown vertigo recently, has had heart palpitations and a floating feeling. She also noted difficulty with turning to check her blind-spot in the car. She reports this triggered a swimmy-headed feeling.  Pt accompanied by: self  PERTINENT HISTORY:   Pt presents to PT eval due to dizziness. Pt reports onset in 2021 and that severity of her symptoms can vary. Initial episode was severe and occurred while driving after turning her head.  She also experienced vomiting with this. The pt has since had mild episodes and one other severe episode in June or July with vomiting. She can become very unsteady when this happens.  Pt reports she has had symptoms as recent as yesterday while at work. Her symptoms were triggered by turning her head in this instance. Pt takes Zofran or Meclizine at times due to symptoms severity. She reports unsteadiness at times but no falls. Pt reports she has had positive Dix-Hallpike 2x in the past and  that she thinks treatment 1x did help her. Per chart and pt her recent VNG testing was normal.  Symptom description: can have vertigo, feelings of pressure/heaviness in the back of her head, pt can feel foggy headed at times, unsteady. Pt wears B hearing aids due to hearing loss that occurred about 3-4 years ago, hearing has not worsened. She reports L side hearing is worse than R. She at times experiences ringing in each ear and also "cricket" type noises. Pt with hx of migraines but is unsure if she experiences headache symptoms with dizziness. She reports light sensitivity, denies sensitivity to noises.   Duration of vertigo: yesterday pt reports feelings of spinning lasted seconds-minutes, in severe cases pt reports she feels it has lasted hours, but is not entirely sure if this was a spinning sensation. Her symptoms/vertigo usually are triggered due to a change in position, sometimes she feels "floaty" and repots it is "not a full blown spin." Her symptoms can then intensify with movement even without moving her head.  Movements that trigger symptoms: laying down, turning her head, bending, pt unsure about tilting her head back  PMH includes: hearing loss, chronic migraine without auroa without status migrainosus, not intractable, anxiety, depression, panic attacks, pre-syncope, muscle spasm, insomnia per pt taking B12 supplement due to hx of low B12  PAIN:  Are you having pain? No  PRECAUTIONS: Fall  WEIGHT BEARING RESTRICTIONS: No  FALLS: Has patient fallen in last 6 months? No  LIVING ENVIRONMENT:    PLOF: Independent  PATIENT GOALS: improve symptoms, balance, pt reports symptoms affecting daily activities/work  OBJECTIVE:   DIAGNOSTIC FINDINGS:   VNG testing results per referral, [place], "Dix Hallpike maneuvers were negative. There was no spontaneous or gaze nystagmus. Random saccades, smooth pursuit visual tracking and optokinetic testing were normal. No post headshake  nysgamus was present. No positional nystagmus was recorded. Calorics revealed no significant weakness. Impression: Normal VNG."  Most recent imaging per chart 06/13/2020 MR BRAIN W WO CONTRAST: "IMPRESSION: Unremarkable MRI of the brain.:  COGNITION: Overall cognitive status: Within functional limits for tasks assessed    POSTURE:  No Significant postural limitations  Cervical ROM:   Pt observed to have WFL cervical rotation and extension during session that is pain-free. Formal measurements deferred  STRENGTH: deferred   BED MOBILITY: limited occ with symptom onset (laying down can be a potential trigger)  TRANSFERS: Assistive device utilized: None  Sit to stand: Complete Independence Stand to sit: Complete Independence Comments: pt reports mobility can become severely limited when pt has dizziness symptoms due to unsteadiness. However, none at present moment limiting mobility  GAIT: Gait pattern:  currently [facility], however, pt reports when she has dizziness onset she becomes very unsteady and must sit down or at times reach out to support herself Distance walked: clinic distances Assistive device utilized: None Level of assistance: Complete Independence   FUNCTIONAL TESTS: deferred DGI and M-CTSIB to be performed next 1-2 visits   PATIENT SURVEYS:  FOTO 51 (goal 87)  VESTIBULAR ASSESSMENT:  GENERAL OBSERVATION:    SYMPTOM BEHAVIOR:  Subjective history: Onset of dizziness and unsteadiness in 2021 with multiple mild episodes and 2 severe episodes (see above for details)  Non-Vestibular symptoms: tinnitus, nausea/vomiting, migraine symptoms, and reports hx of syncopal episodes  Type of dizziness: Imbalance (Disequilibrium) and Spinning/Vertigo  Frequency: reports multiple episodes since 2021. Pt thinks can be positionally triggered but has at times worsened even when relatively still (not moving her head) but may be changing position  Duration: reports seconds and even  hours at times  Aggravating factors:  positional changes  Relieving factors:  still/rest, rescue meds  OCULOMOTOR EXAM:  Ocular Alignment: normal  Ocular ROM: No Limitations  Spontaneous Nystagmus: absent  Gaze-Induced Nystagmus: absent  Smooth Pursuits: saccades, pt reports she can at times feel when this occurs  Saccades: intact  Convergence/Divergence: WNL  VESTIBULAR - OCULAR REFLEX:   Slow VOR: Normal  Head-Impulse Test: WNL B   POSITIONAL TESTING:  Right Dix-Hallpike: negative Left Dix-Hallpike: Performed 2x for clarity. Initial test pt with delayed onset of extremely brief jerk nystagmus that appeared rotational and possibly downbeat. Once symptoms resolved and pt sat back up, a second Dix-Hallpike test performed, pt again with delayed onset of jerk nystagmus that was rotational and clearly up-beating to L. Pt reported dizziness with testing on L side as well.   OTHOSTATICS: deferred; indicated due to hx of syncopal episodes, to be performed next 1-2 visits  FUNCTIONAL GAIT: Dynamic Gait Index: deferred   VESTIBULAR TREATMENT:                                                                                                   DATE:   VOR exercise 1x30sec Firm surface static balance 1x30sec  VOR exercise 1x30sec Firm surface static balance 1x30sec VOR exercise 1x30sec Firm surface static balance 1x30sec  VOR exercise 1x30sec Firm surface static balance 1x30sec  Airex stance ball self toss, catch x90sec vertical fixed gaze   Forward gaze fixation, cues for 45 degrees head turn, ball catch, toss, return to center x8 bilat   45 degree rotation fixation, cued 45 degree head turn opposite, ball catch, toss, return to center x8 bilat  90 degree trunk rotation + overhead rebound alternating sides x20    90 degree fixed cervical rotation, cue for glance and return, callin gout number of authors fingers on opposite side  Symptoms with glancing Left from flexed right *cues  for neutral chin height  AMB 2x4 balance beam x8 AMB 2x4 balance beam x8, beam on airex pads  AMB eyes closed, normal hosuehold pace 2x20ft (stopped pt for course correction 6x)    PATIENT EDUCATION: Education details: positional assessment, indications, exercise technique Person educated: Patient Education method: Explanation and Handouts Education comprehension: verbalized understanding, returned demonstration, and needs further education  HOME EXERCISE PROGRAM:  09/09/22: Access Code: [email or web address] URL: [email or web address] Date: 09/09/2022 Prepared by: Ricard Dillon  Exercises - Seated Gaze Stabilization with Head Rotation  - 1 x daily - 7 x weekly - 1 sets - 3 reps - 10 seconds  hold - Corner Balance Feet Together: Eyes Closed With Head Turns  - 1 x daily - 5 x weekly - 2 sets - 20 reps  GOALS: Goals reviewed with patient? No - goals to be reviewed next appointment  SHORT TERM GOALS: Target date: 11/26/2022   Patient will be independent in home exercise program to improve positionally triggered dizziness symptoms and balance to increase functional independence and safety with ADLs/job duties. Baseline: to be initiated (did provide and review symptom tracking tool/handout);  09/09/2022: initiated VORx1 seated horizontal head turns, and standing in corner EC with vertical and horizontal head turns Goal status: INITIAL   LONG TERM GOALS: Target date: 01/07/2023    Patient will increase FOTO score to equal to or greater than  57   to demonstrate statistically significant improvement in mobility and quality of life.  Baseline: 54 Goal status: INITIAL  2.  Patient will report at least a 50% reduction in positionally triggered dizziness and unsteadiness symptoms in order to increase safety with ADLs, job duties, and QOL Baseline: symptoms brought on by positional changes Goal status: INITIAL   3.  Patient will increase dynamic gait index score to >19/24 as to demonstrate  reduced fall risk and improved dynamic gait balance for better safety with community/home ambulation.   Baseline: to be completed next 1-2 visits; 09/09/2022: 23/24 Goal status: DISCONTINUED/MET pt achieves greater than 19/24 first attempt, only slight impairment in gait with horizontal head turns  ASSESSMENT:  CLINICAL IMPRESSION: Symptoms remain stable, intermittent mild, mostly with fast head turns driving. Able to incorporate much more fast cervical rotation this session without much provocation. Maintained VOR practice at 2 Hz. The pt will benefit from further skilled PT to improve positionally-triggered dizziness symptoms and imbalance in order to increase QOL and safety with ADLs.  OBJECTIVE IMPAIRMENTS: decreased activity tolerance, decreased balance, difficulty walking, and dizziness.   ACTIVITY LIMITATIONS: bending, stairs, bed mobility, and locomotion level  PARTICIPATION LIMITATIONS: cleaning, driving, shopping, community activity, occupation, yard work, and all ADLs can be severely affected/limited when pt having dizzy episode  PERSONAL FACTORS: Sex, Time since onset of injury/illness/exacerbation, and 3+ comorbidities: PMH includes: hearing loss, chronic migraine without auroa without status migrainosus, not intractable, anxiety, depression, panic attacks, pre-syncope, muscle spasm, insomnia per pt taking B12 supplement due to hx of low B12  are also affecting patient's functional outcome.   REHAB POTENTIAL: Good  CLINICAL DECISION MAKING: Evolving/moderate complexity  EVALUATION COMPLEXITY: Moderate   PLAN:  PT FREQUENCY: 2x/week  PT DURATION: 12 weeks  PLANNED INTERVENTIONS: Therapeutic exercises, Therapeutic activity, Neuromuscular re-education, Balance training, Gait training, Patient/Family education, Self Care, Joint mobilization, Stair training, Vestibular training, Canalith repositioning, Visual/preceptual remediation/compensation, Orthotic/Fit training, DME  instructions, Dry Needling, Electrical stimulation, Wheelchair mobility training, Spinal mobilization, Cryotherapy, Moist heat, Splintting, Taping, Manual therapy, and Re-evaluation  PLAN FOR NEXT SESSION: balance, compliant surface training, EC balance, VOR   Jamall Strohmeier C, PT 10/15/2022, 11:16 AM   11:17 AM, 10/15/22 Candice [name], PT, DPT Physical Therapist - [place] [phone]

## 2022-10-16 ENCOUNTER — Ambulatory Visit: Payer: Self-pay

## 2022-10-20 ENCOUNTER — Ambulatory Visit: Payer: Commercial Managed Care - PPO

## 2022-10-20 DIAGNOSIS — R42 Dizziness and giddiness: Secondary | ICD-10-CM

## 2022-10-20 DIAGNOSIS — R2681 Unsteadiness on feet: Secondary | ICD-10-CM | POA: Diagnosis not present

## 2022-10-20 NOTE — Therapy (Unsigned)
OUTPATIENT PHYSICAL THERAPY VESTIBULAR TREATMENT NOTE     Patient Name: LISE KOVALCHIK MRN: [id number] DOB:Jun 26, 1986, 37 y.o., female Today's Date: 10/15/2022  END OF SESSION:  PT End of Session - 10/15/22 1106     Visit Number 5    Number of Visits 25    Date for PT Re-Evaluation 11/19/22    Authorization Type [employer]    Authorization Time Period 08/27/22-11/19/22    PT Start Time 1100    PT Stop Time 1140    PT Time Calculation (min) 40 min    Activity Tolerance Patient tolerated treatment well    Behavior During Therapy WFL for tasks assessed/performed              Past Medical History:  Diagnosis Date   Anxiety    Depression    Hearing loss    Migraines    a. x 10+ years.   Postpartum care following cesarean delivery (5/29) 02/04/2014   Pre-syncope    a. 07/2015 [facility] ED visit, ? vasovagal, improved with IVF.   Short cervix in second trimester, antepartum 11/16/2013   Past Surgical History:  Procedure Laterality Date   CESAREAN SECTION N/A 02/03/2014   Procedure: Primary Cesarean Section Delivery Baby Girl @ [id number], Apgars 9/9;  Surgeon: Marvene Staff, MD;  Location: [facility];  Service: Obstetrics;  Laterality: N/A;   CESAREAN SECTION     WISDOM TOOTH EXTRACTION  2002   Patient Active Problem List   Diagnosis Date Noted   MDD (major depressive disorder), recurrent episode, mild ([place]) 10/30/2020   Insomnia due to mental condition 07/14/2019   Anxiety 05/13/2019   MDD (major depressive disorder), recurrent, in full remission ([place]) 04/08/2019   Panic attacks 04/08/2019   MDD (major depressive disorder), recurrent, in partial remission ([place]) 04/08/2019   Stress at home 12/19/2016   Muscle spasm 06/06/2016   Migraine without aura and with status migrainosus, not intractable 11/23/2015   Migraine without aura and without status migrainosus, not intractable 09/11/2015   Chronic migraine without aura without status migrainosus, not intractable  09/11/2015   Tachycardia 08/27/2015   Depression, major, recurrent, moderate ([place]) 02/19/2015   Postpartum care following cesarean delivery (5/29) 02/04/2014   Indication for care in labor or delivery 02/03/2014   Short cervix in second trimester, antepartum 11/16/2013   Antepartum bleeding, second trimester 11/15/2013    PCP: Gladstone Lighter, MD  REFERRING PROVIDER:  Willaim Rayas, PA      REFERRING DIAG:  R42 (ICD-10-CM) - Dizziness and giddiness      THERAPY DIAG:  Dizziness and giddiness  Unsteadiness on feet  ONSET DATE: 2021   Rationale for Evaluation and Treatment: Rehabilitation  SUBJECTIVE:   SUBJECTIVE STATEMENT: Pt reports only one instance of feeling symptomatic in the [employer] where she could feel some sway going back.  Pt reports no falls. One other instance of brief dizziness when looking to R.   Pt accompanied by: self  PERTINENT HISTORY:   Pt presents to PT eval due to dizziness. Pt reports onset in 2021 and that severity of her symptoms can vary. Initial episode was severe and occurred while driving after turning her head. She also experienced vomiting with this. The pt has since had mild episodes and one other severe episode in June or July with vomiting. She can become very unsteady when this happens.  Pt reports she has had symptoms as recent as yesterday while at work. Her symptoms were triggered by turning her  head in this instance. Pt takes Zofran or Meclizine at times due to symptoms severity. She reports unsteadiness at times but no falls. Pt reports she has had positive Dix-Hallpike 2x in the past and that she thinks treatment 1x did help her. Per chart and pt her recent VNG testing was normal.  Symptom description: can have vertigo, feelings of pressure/heaviness in the back of her head, pt can feel foggy headed at times, unsteady. Pt wears B hearing aids due to hearing loss that occurred about 3-4 years ago, hearing has not worsened.  She reports L side hearing is worse than R. She at times experiences ringing in each ear and also "cricket" type noises. Pt with hx of migraines but is unsure if she experiences headache symptoms with dizziness. She reports light sensitivity, denies sensitivity to noises.   Duration of vertigo: yesterday pt reports feelings of spinning lasted seconds-minutes, in severe cases pt reports she feels it has lasted hours, but is not entirely sure if this was a spinning sensation. Her symptoms/vertigo usually are triggered due to a change in position, sometimes she feels "floaty" and repots it is "not a full blown spin." Her symptoms can then intensify with movement even without moving her head.  Movements that trigger symptoms: laying down, turning her head, bending, pt unsure about tilting her head back  PMH includes: hearing loss, chronic migraine without auroa without status migrainosus, not intractable, anxiety, depression, panic attacks, pre-syncope, muscle spasm, insomnia per pt taking B12 supplement due to hx of low B12  PAIN:  Are you having pain? No  PRECAUTIONS: Fall  WEIGHT BEARING RESTRICTIONS: No  FALLS: Has patient fallen in last 6 months? No  LIVING ENVIRONMENT:    PLOF: Independent  PATIENT GOALS: improve symptoms, balance, pt reports symptoms affecting daily activities/work  OBJECTIVE:   DIAGNOSTIC FINDINGS:   VNG testing results per referral, [place], "Dix Hallpike maneuvers were negative. There was no spontaneous or gaze nystagmus. Random saccades, smooth pursuit visual tracking and optokinetic testing were normal. No post headshake nysgamus was present. No positional nystagmus was recorded. Calorics revealed no significant weakness. Impression: Normal VNG."  Most recent imaging per chart 06/13/2020 MR BRAIN W WO CONTRAST: "IMPRESSION: Unremarkable MRI of the brain.:  COGNITION: Overall cognitive status: Within functional limits for tasks assessed    POSTURE:  No  Significant postural limitations  Cervical ROM:   Pt observed to have WFL cervical rotation and extension during session that is pain-free. Formal measurements deferred  STRENGTH: deferred   BED MOBILITY: limited occ with symptom onset (laying down can be a potential trigger)  TRANSFERS: Assistive device utilized: None  Sit to stand: Complete Independence Stand to sit: Complete Independence Comments: pt reports mobility can become severely limited when pt has dizziness symptoms due to unsteadiness. However, none at present moment limiting mobility  GAIT: Gait pattern:  currently [facility], however, pt reports when she has dizziness onset she becomes very unsteady and must sit down or at times reach out to support herself Distance walked: clinic distances Assistive device utilized: None Level of assistance: Complete Independence   FUNCTIONAL TESTS: deferred DGI and M-CTSIB to be performed next 1-2 visits   PATIENT SURVEYS:  FOTO 51 (goal 57)  VESTIBULAR ASSESSMENT:  GENERAL OBSERVATION:    SYMPTOM BEHAVIOR:  Subjective history: Onset of dizziness and unsteadiness in 2021 with multiple mild episodes and 2 severe episodes (see above for details)  Non-Vestibular symptoms: tinnitus, nausea/vomiting, migraine symptoms, and reports hx of syncopal episodes  Type  of dizziness: Imbalance (Disequilibrium) and Spinning/Vertigo  Frequency: reports multiple episodes since 2021. Pt thinks can be positionally triggered but has at times worsened even when relatively still (not moving her head) but may be changing position  Duration: reports seconds and even hours at times  Aggravating factors:  positional changes  Relieving factors:  still/rest, rescue meds  OCULOMOTOR EXAM:  Ocular Alignment: normal  Ocular ROM: No Limitations  Spontaneous Nystagmus: absent  Gaze-Induced Nystagmus: absent  Smooth Pursuits: saccades, pt reports she can at times feel when this occurs  Saccades:  intact  Convergence/Divergence: WNL  VESTIBULAR - OCULAR REFLEX:   Slow VOR: Normal  Head-Impulse Test: WNL B   POSITIONAL TESTING:  Right Dix-Hallpike: negative Left Dix-Hallpike: Performed 2x for clarity. Initial test pt with delayed onset of extremely brief jerk nystagmus that appeared rotational and possibly downbeat. Once symptoms resolved and pt sat back up, a second Dix-Hallpike test performed, pt again with delayed onset of jerk nystagmus that was rotational and clearly up-beating to L. Pt reported dizziness with testing on L side as well.   OTHOSTATICS: deferred; indicated due to hx of syncopal episodes, to be performed next 1-2 visits  FUNCTIONAL GAIT: Dynamic Gait Index: deferred   VESTIBULAR TREATMENT:                                                                                                   DATE:   Nustep  Lvl 1 x 1 minute Lvl 4 x 1 minute   Lvl 5 x 1 minute Lvl 3 x 1 minute  Lvl 2 x 1 minute  HR following intervention  118-119 bpm   360 rolls against wall 6x6 ft   EC on airex 2x30 sec- increased sway, no LOB   NBOS on airex vertical and horizontal head turn   Rainbow ball gait with vertical and horizontal tracking, then with passing with twist   Standing VORx2 horizontal head turns, 30 sec, increased symptoms  SLB 30 sec each LE  Tandem gait 2x10 meters  Seated VORx2 horizontal head turns, vertical head turns 2x30 sec each. Pt states, "I lose it when I go up"  360 rolls 4x6 ft. One instance of feeling pulled to the R.   Optokinetic stimulation video vertical bars L>R Optokinetic stimulation horizontal bars going down screen, following red dot with eye  Ambulation with VORx1 horizontal and vertical head turns over 2x10 meters for each  Tandem-stance 30 sec each LE   PATIENT EDUCATION: Education details: positional assessment, indications, exercise technique Person educated: Patient Education method: Explanation and Handouts Education  comprehension: verbalized understanding, returned demonstration, and needs further education  HOME EXERCISE PROGRAM:  09/09/22: Access Code: [email or web address] URL: [email or web address] Date: 09/09/2022 Prepared by: Ricard Dillon  Exercises - Seated Gaze Stabilization with Head Rotation  - 1 x daily - 7 x weekly - 1 sets - 3 reps - 10 seconds hold - Corner Balance Feet Together: Eyes Closed With Head Turns  - 1 x daily - 5 x weekly - 2 sets - 20 reps  GOALS: Goals reviewed with patient? No - goals  to be reviewed next appointment  SHORT TERM GOALS: Target date: 11/26/2022   Patient will be independent in home exercise program to improve positionally triggered dizziness symptoms and balance to increase functional independence and safety with ADLs/job duties. Baseline: to be initiated (did provide and review symptom tracking tool/handout);  09/09/2022: initiated VORx1 seated horizontal head turns, and standing in corner EC with vertical and horizontal head turns Goal status: INITIAL   LONG TERM GOALS: Target date: 01/07/2023    Patient will increase FOTO score to equal to or greater than  57   to demonstrate statistically significant improvement in mobility and quality of life.  Baseline: 54 Goal status: INITIAL  2.  Patient will report at least a 50% reduction in positionally triggered dizziness and unsteadiness symptoms in order to increase safety with ADLs, job duties, and QOL Baseline: symptoms brought on by positional changes Goal status: INITIAL   3.  Patient will increase dynamic gait index score to >19/24 as to demonstrate reduced fall risk and improved dynamic gait balance for better safety with community/home ambulation.   Baseline: to be completed next 1-2 visits; 09/09/2022: 23/24 Goal status: DISCONTINUED/MET pt achieves greater than 19/24 first attempt, only slight impairment in gait with horizontal head turns  ASSESSMENT:  CLINICAL IMPRESSION: Symptoms remain stable,  intermittent mild, mostly with fast head turns driving. Able to incorporate much more fast cervical rotation this session without much provocation. Maintained VOR practice at 2 Hz. The pt will benefit from further skilled PT to improve positionally-triggered dizziness symptoms and imbalance in order to increase QOL and safety with ADLs.  OBJECTIVE IMPAIRMENTS: decreased activity tolerance, decreased balance, difficulty walking, and dizziness.   ACTIVITY LIMITATIONS: bending, stairs, bed mobility, and locomotion level  PARTICIPATION LIMITATIONS: cleaning, driving, shopping, community activity, occupation, yard work, and all ADLs can be severely affected/limited when pt having dizzy episode  PERSONAL FACTORS: Sex, Time since onset of injury/illness/exacerbation, and 3+ comorbidities: PMH includes: hearing loss, chronic migraine without auroa without status migrainosus, not intractable, anxiety, depression, panic attacks, pre-syncope, muscle spasm, insomnia per pt taking B12 supplement due to hx of low B12  are also affecting patient's functional outcome.   REHAB POTENTIAL: Good  CLINICAL DECISION MAKING: Evolving/moderate complexity  EVALUATION COMPLEXITY: Moderate   PLAN:  PT FREQUENCY: 2x/week  PT DURATION: 12 weeks  PLANNED INTERVENTIONS: Therapeutic exercises, Therapeutic activity, Neuromuscular re-education, Balance training, Gait training, Patient/Family education, Self Care, Joint mobilization, Stair training, Vestibular training, Canalith repositioning, Visual/preceptual remediation/compensation, Orthotic/Fit training, DME instructions, Dry Needling, Electrical stimulation, Wheelchair mobility training, Spinal mobilization, Cryotherapy, Moist heat, Splintting, Taping, Manual therapy, and Re-evaluation  PLAN FOR NEXT SESSION: balance, compliant surface training, EC balance, VOR   Buccola,Allan C, PT 10/15/2022, 11:16 AM   11:17 AM, 10/15/22 Etta Grandchild, PT, DPT Physical  Therapist - [place] [phone]

## 2022-10-22 NOTE — Telephone Encounter (Signed)
Any updates on auth? Pt scheduled for 10/30/22

## 2022-10-23 ENCOUNTER — Ambulatory Visit: Payer: Commercial Managed Care - PPO

## 2022-10-23 NOTE — Telephone Encounter (Signed)
Pharmacy Patient Advocate Encounter  Prior Authorization for Botox 200 units has been approved.    PA# Case number: [id number] Effective dates: 10/15/2022 through 10/15/2023 This will be [employer] and [employer]

## 2022-10-27 ENCOUNTER — Ambulatory Visit: Payer: Commercial Managed Care - PPO | Admitting: Psychiatry

## 2022-10-29 NOTE — Progress Notes (Unsigned)
08/04/22: Reports that Botox works well except for the last 2 weeks if she starts to have breakthrough headaches.  She states that she typically has 2 mild headaches during the week that she can take Excedrin Migraine.  Has not had any severe headaches.  Tries to take Nurtec as a preventative medicine 2 weeks before her Botox is due.  Will use [name] as an abortive medication if needed  05/06/22: Botox is working well.  She is reports that she has been having trouble with vertigo not associated with her migraines.  Will be start vestibular rehab  02/06/22: Tried nurtec daily for the last week but not sure it helped? Maybe knocked the edge off. Otherwise her headaches are controlled. Has a mild headache 1-2 times a week. Usually will try OTC medicine first then will use ubrelvy. Encouraged her to use the ubrelvy first thing.  11/12/20: Patient states that Botox is working well for her.  She states the week before her Botox is due she tends to have more headaches.  She is using the Ubrelvy and sometimes nurtrec. Advised that she could try taking nurtec daily 5 days before her cycle to try to prevent migraines.    BOTOX PROCEDURE NOTE FOR MIGRAINE HEADACHE    Contraindications and precautions discussed with patient(above). Aseptic procedure was observed and patient tolerated procedure. Procedure performed by Ward Givens, NP  The condition has existed for more than 6 months, and pt does not have a diagnosis of ALS, Myasthenia Gravis or Lambert-Eaton Syndrome.  Risks and benefits of injections discussed and pt agrees to proceed with the procedure.  Written consent obtained  These injections are medically necessary. These injections do not cause sedations or hallucinations which the oral therapies may cause.  Indication/Diagnosis: chronic migraine BOTOX(J0585) injection was performed according to protocol by [employer]. 200 units of BOTOX was dissolved into 4 cc NS.   NDC: [id number]  Type of  toxin: Botox    Botox- 200 units x 1 vial Lot: [id number] Expiration: 12/2024 NDC: [id number]   Bacteriostatic 0.9% Sodium Chloride- 31m total Lot: [id number]: 05/10/2023 NDC: [id number]  Dx: [id number]   Description of procedure:  The patient was placed in a sitting position. The standard protocol was used for Botox as follows, with 5 units of Botox injected at each site:   -Procerus muscle, midline injection  -Corrugator muscle, bilateral injection  -Frontalis muscle, bilateral injection, with 2 sites each side, medial injection was performed in the upper one third of the frontalis muscle, in the region vertical from the medial inferior edge of the superior orbital rim. The lateral injection was again in the upper one third of the forehead vertically above the lateral limbus of the cornea, 1.5 cm lateral to the medial injection site.  -Temporalis muscle injection, 4 sites, bilaterally. The first injection was 3 cm above the tragus of the ear, second injection site was 1.5 cm to 3 cm up from the first injection site in line with the tragus of the ear. The third injection site was 1.5-3 cm forward between the first 2 injection sites. The fourth injection site was 1.5 cm posterior to the second injection site.  -Occipitalis muscle injection, 3 sites, bilaterally. The first injection was done one half way between the occipital protuberance and the tip of the mastoid process behind the ear. The second injection site was done lateral and superior to the first, 1 fingerbreadth from the first injection. The third injection site was 1 fingerbreadth superiorly  and medially from the first injection site.  -Cervical paraspinal muscle injection, 2 sites, bilateral knee first injection site was 1 cm from the midline of the cervical spine, 3 cm inferior to the lower border of the occipital protuberance. The second injection site was 1.5 cm superiorly and laterally to the first injection  site.  -Trapezius muscle injection was performed at 3 sites, bilaterally. The first injection site was in the upper trapezius muscle halfway between the inflection point of the neck, and the acromion. The second injection site was one half way between the acromion and the first injection site. The third injection was done between the first injection site and the inflection point of the neck.   Will return for repeat injection in 3 months.   A 200 unitsof Botox was used, 155 units were injected, the rest of the Botox was wasted. The patient tolerated the procedure well, there were no complications of the above procedure.  Ward Givens, MSN, NP-C 10/29/2022, 4:17 PM [facility] [address] [place], [place] [address] [phone]

## 2022-10-30 ENCOUNTER — Ambulatory Visit (INDEPENDENT_AMBULATORY_CARE_PROVIDER_SITE_OTHER): Payer: Commercial Managed Care - PPO | Admitting: Adult Health

## 2022-10-30 ENCOUNTER — Ambulatory Visit: Payer: Commercial Managed Care - PPO

## 2022-10-30 DIAGNOSIS — G43709 Chronic migraine without aura, not intractable, without status migrainosus: Secondary | ICD-10-CM | POA: Diagnosis not present

## 2022-10-30 MED ORDER — ONABOTULINUMTOXINA 200 UNITS IJ SOLR
155.0000 [IU] | Freq: Once | INTRAMUSCULAR | Status: AC
  Administered 2022-10-30: 155 [IU] via INTRAMUSCULAR

## 2022-10-30 NOTE — Progress Notes (Signed)
Botox- 200 units x 1 vial Lot: [id number] Expiration: 02/2025 NDC: [id number]  Bacteriostatic 0.9% Sodium Chloride- 41m total Lot: [id number]: 07/2024 NDC: [id number] Dx: [id number]

## 2022-10-31 ENCOUNTER — Encounter: Payer: Self-pay | Admitting: Adult Health

## 2022-11-04 ENCOUNTER — Ambulatory Visit: Payer: Commercial Managed Care - PPO

## 2022-11-06 ENCOUNTER — Ambulatory Visit: Payer: Commercial Managed Care - PPO

## 2022-11-10 ENCOUNTER — Other Ambulatory Visit: Payer: Self-pay

## 2022-11-10 ENCOUNTER — Ambulatory Visit: Payer: Commercial Managed Care - PPO

## 2022-11-11 ENCOUNTER — Other Ambulatory Visit (HOSPITAL_COMMUNITY): Payer: Self-pay

## 2022-11-12 ENCOUNTER — Ambulatory Visit: Payer: Commercial Managed Care - PPO

## 2022-11-17 ENCOUNTER — Ambulatory Visit: Payer: Commercial Managed Care - PPO | Attending: Student

## 2022-11-17 ENCOUNTER — Telehealth: Payer: Self-pay

## 2022-11-17 DIAGNOSIS — R42 Dizziness and giddiness: Secondary | ICD-10-CM | POA: Insufficient documentation

## 2022-11-17 NOTE — Telephone Encounter (Signed)
PT contacted pt via secure line due missed visit/no-show today. Pt reports her daughter was hospitalized recently, was just discharged and pt forgot about her appointment with everything going on. PT provided information regarding pt's next visit date/time and to call clinic to cancel if she is unable to make her appointment. Pt verbalized understanding, plans to attend next appointment.   Ricard Dillon PT, DPT

## 2022-11-18 ENCOUNTER — Ambulatory Visit: Payer: Commercial Managed Care - PPO

## 2022-11-20 ENCOUNTER — Ambulatory Visit (INDEPENDENT_AMBULATORY_CARE_PROVIDER_SITE_OTHER): Payer: Commercial Managed Care - PPO | Admitting: Licensed Clinical Social Worker

## 2022-11-20 ENCOUNTER — Encounter (HOSPITAL_COMMUNITY): Payer: Self-pay | Admitting: Licensed Clinical Social Worker

## 2022-11-20 ENCOUNTER — Encounter (HOSPITAL_COMMUNITY): Payer: Self-pay

## 2022-11-20 ENCOUNTER — Ambulatory Visit: Payer: Commercial Managed Care - PPO

## 2022-11-20 DIAGNOSIS — F330 Major depressive disorder, recurrent, mild: Secondary | ICD-10-CM

## 2022-11-20 DIAGNOSIS — F418 Other specified anxiety disorders: Secondary | ICD-10-CM | POA: Diagnosis not present

## 2022-11-20 NOTE — Progress Notes (Signed)
   11/20/22 1613  Step One: Remove Access  What things should I remove or limit my access to? Temporarily store all guns with a friend, relative, gun shop or storage facility, ask someone to hold on to thekeys to my gun locks or gun safe.  What might I use to hurt myself? no identified plan  What other actions can I take to limit ways that I can hurt myself? communicate needs to: friend or mother  Step Two: Warning Signs  What are my specific triggers or warning signs a crisis is developing? Feeling down, sad, or crying;Feeling hopeless things won't ever get better;Feeling worthless or a failure;Being in physical pain;Feeling anxious, agitated;Feeling angry, argumentative, or short tempered;Feeling overwhelmed by life circumstances, e.g, finances  Step Three: Distracting Activities  What engaging and calming activities can I do to distract myself? Watch television;Do something nice for someone else;Go online, play video games;Listen to music;Go for a walk, exercise;Do a hobby, favorite activity;Other: (being around people)  Step Four: Distracting People and Places  Who helps me to take my mind off of my problems? dad, social worker friend  Where can I go to take my mind off my problems? bedroom upstairs to decompress and be alone  Step Five: Social Support  Which family members or friends can I talk to to help me feel better? dad, social worker friend  Who can help me get through the crisis? dad, social worker friend, psychiatrist, therapist  Who is supportive? father, friend  Step Six: Professional Help  Where can I get help? Local ED's, [facility] ([place], [facility]), [facility] ([facility]--[address]), 911, text or call [phone]

## 2022-11-20 NOTE — Progress Notes (Signed)
Virtual Visit via Video Note  I connected with Candice [name] on 11/20/22 at  4:00 PM EDT by a video enabled telemedicine application and verified that I am speaking with the correct person using two identifiers.  Location: Patient: home Provider: remote office [place], [place])   I discussed the limitations of evaluation and management by telemedicine and the availability of in person appointments. The patient expressed understanding and agreed to proceed.  I discussed the assessment and treatment plan with the patient. The patient was provided an opportunity to ask questions and all were answered. The patient agreed with the plan and demonstrated an understanding of the instructions.   The patient was advised to call back or seek an in-person evaluation if the symptoms worsen or if the condition fails to improve as anticipated.  I provided 45 minutes of non-face-to-face time during this encounter.   Candice [name] R Candice Beilke, LCSW   THERAPIST PROGRESS NOTE  Session Time: 4-445p  Participation Level: Active  Behavioral Response: Neat and Well GroomedAlertAnxious  Type of Therapy: Individual Therapy  Treatment Goals addressed: Decrease depressive symptoms and improve levels of effective functioning-pt reports a decrease in overall depression symptoms 3 out of 5 [name] documented   Develop healthy thinking patterns and beliefs about self, others, and the world that lead to the alleviation and help prevent the relapse of depression per self report 3 out of 5 [name] documented   ProgressTowards Goals: Progressing  Interventions: CBT, Motivational Interviewing, and Supportive  Summary: Candice [name] is a 37 y.o. female who presents with continuing symptoms related to depression.  Patient reports that she had an episode in December around the holidays when she forgot to take her medication, and was having suicidal ideation during a time of stress.  Patient denies any plans or  intent to follow through.  Developed a Chief Strategy Officer with patient and sent through [place].   Allowed pt to explore and express thoughts and feelings associated with recent life situations and external stressors.  Patient states that she is continuing to have significant stress surrounding her 82-year-old daughter who has diagnosis of seizures.  Patient states that she is frustrated because she is getting different information from physicians, and is unclear about prognosis.  Patient states that she is experiencing grief associated with the loss of her grandmother.  Allow patient safe space to explore feelings triggered by the loss of her grandmother.  Reviewed grief expectations.  Patient states that she is experiencing stress associated with the potential need to euthanize her dog.  Patient states that she had a very strange reaction to Botox injections in November-patient states that she got very nauseous, and the nodule lasted for quite a while.  Patient states she has never had this reaction before.  Patient states that the end of the day she often does not have the energy to engage in any self-care activities or behaviors.  Encouraged patient to identify self-care behaviors that she could possibly incorporate into the day, even if it is only a few days a week not every day.  Patient states that relationships are good between herself and her husband, and family members.  Patient states that things are going well at work, and that she has her best friend who is also a Education officer, museum that helps her manage things emotionally, as well.  Continued recommendations are as follows: self care behaviors, positive social engagements, focusing on overall work/home/life balance, and focusing on positive physical and emotional wellness.   Suicidal/Homicidal: No.  Pt did state that she had some SI around christmas holidays after accidentally not taking medication for a while. Pt denies current SI. Developed safety  plan w/ pt and sent in separate MyChart message.   Therapist Response: Pt is continuing to apply interventions learned in session into daily life situations. Pt is currently on track to meet goals utilizing interventions mentioned above. Personal growth and progress noted. Treatment to continue as indicated.   The patient continues to make good progress with self awareness, and life management.  Patient is recognizing when she is having negative thoughts and is able to manage/redirect those thoughts in the moment.  Patient is demonstrating a growing capacity for social engagement and positive work environment.  Plan: Return again in 4 weeks.  Diagnosis:  Encounter Diagnoses  Name Primary?   MDD (major depressive disorder), recurrent episode, mild ([place]) Yes   Other specified anxiety disorders    Collaboration of Care: Other Pt encouraged to continue psychiatric care with Dr. Shea Evans  Patient/Guardian was advised Release of Information must be obtained prior to any record release in order to collaborate their care with an outside provider. Patient/Guardian was advised if they have not already done so to contact the registration department to sign all necessary forms in order for Candice [name] to release information regarding their care.   Consent: Patient/Guardian gives verbal consent for treatment and assignment of benefits for services provided during this visit. Patient/Guardian expressed understanding and agreed to proceed.   [place], LCSW 11/20/2022

## 2022-11-24 ENCOUNTER — Ambulatory Visit: Payer: Commercial Managed Care - PPO

## 2022-11-27 ENCOUNTER — Ambulatory Visit: Payer: Commercial Managed Care - PPO

## 2022-11-27 DIAGNOSIS — R42 Dizziness and giddiness: Secondary | ICD-10-CM

## 2022-11-27 NOTE — Therapy (Signed)
OUTPATIENT PHYSICAL THERAPY VESTIBULAR TREATMENT NOTE/DISCHARGE     Patient Name: Candice [name] MRN: [id number] DOB:13-Apr-1986, 37 y.o., female Today's Date: 11/27/2022  END OF SESSION:  PT End of Session - 11/27/22 1632     Visit Number 7    Number of Visits 25    Date for PT Re-Evaluation 11/27/22    Authorization Type [employer]    Authorization Time Period 08/27/22-11/19/22    PT Start Time 1601    PT Stop Time 1625    PT Time Calculation (min) 24 min    Equipment Utilized During Treatment Gait belt    Activity Tolerance Patient tolerated treatment well    Behavior During Therapy WFL for tasks assessed/performed              Past Medical History:  Diagnosis Date   Anxiety    Depression    Hearing loss    Migraines    a. x 10+ years.   Postpartum care following cesarean delivery (5/29) 02/04/2014   Pre-syncope    a. 07/2015 [facility] ED visit, ? vasovagal, improved with IVF.   Short cervix in second trimester, antepartum 11/16/2013   Past Surgical History:  Procedure Laterality Date   CESAREAN SECTION N/A 02/03/2014   Procedure: Primary Cesarean Section Delivery Baby Girl @ [id number], Apgars 9/9;  Surgeon: Marvene Staff, MD;  Location: [facility];  Service: Obstetrics;  Laterality: N/A;   CESAREAN SECTION     WISDOM TOOTH EXTRACTION  2002   Patient Active Problem List   Diagnosis Date Noted   MDD (major depressive disorder), recurrent episode, mild ([place]) 10/30/2020   Insomnia due to mental condition 07/14/2019   Anxiety 05/13/2019   MDD (major depressive disorder), recurrent, in full remission ([place]) 04/08/2019   Panic attacks 04/08/2019   MDD (major depressive disorder), recurrent, in partial remission ([place]) 04/08/2019   Stress at home 12/19/2016   Muscle spasm 06/06/2016   Migraine without aura and with status migrainosus, not intractable 11/23/2015   Migraine without aura and without status migrainosus, not intractable 09/11/2015   Chronic  migraine without aura without status migrainosus, not intractable 09/11/2015   Tachycardia 08/27/2015   Depression, major, recurrent, moderate ([place]) 02/19/2015   Postpartum care following cesarean delivery (5/29) 02/04/2014   Indication for care in labor or delivery 02/03/2014   Short cervix in second trimester, antepartum 11/16/2013   Antepartum bleeding, second trimester 11/15/2013    PCP: Gladstone Lighter, MD  REFERRING PROVIDER:  Willaim Rayas, PA      REFERRING DIAG:  R42 (ICD-10-CM) - Dizziness and giddiness      THERAPY DIAG:  Dizziness and giddiness  ONSET DATE: 2021   Rationale for Evaluation and Treatment: Rehabilitation  SUBJECTIVE:   SUBJECTIVE STATEMENT: Pt returns to PT after absence, her daughter was hospitalized with a seizure and has since been discharged from the hospital. Pt has been feeling pretty good, she has not had any vertigo. She reports one instance of imbalance issues, thinks may be due to lack of sleep that day.  Pt has neurology appointment first week in April.   Pt accompanied by: self  PERTINENT HISTORY:   Pt presents to PT eval due to dizziness. Pt reports onset in 2021 and that severity of her symptoms can vary. Initial episode was severe and occurred while driving after turning her head. She also experienced vomiting with this. The pt has since had mild episodes and one other severe episode in June or July with vomiting.  She can become very unsteady when this happens.  Pt reports she has had symptoms as recent as yesterday while at work. Her symptoms were triggered by turning her head in this instance. Pt takes Zofran or Meclizine at times due to symptoms severity. She reports unsteadiness at times but no falls. Pt reports she has had positive Dix-Hallpike 2x in the past and that she thinks treatment 1x did help her. Per chart and pt her recent VNG testing was normal.  Symptom description: can have vertigo, feelings of  pressure/heaviness in the back of her head, pt can feel foggy headed at times, unsteady. Pt wears B hearing aids due to hearing loss that occurred about 3-4 years ago, hearing has not worsened. She reports L side hearing is worse than R. She at times experiences ringing in each ear and also "cricket" type noises. Pt with hx of migraines but is unsure if she experiences headache symptoms with dizziness. She reports light sensitivity, denies sensitivity to noises.   Duration of vertigo: yesterday pt reports feelings of spinning lasted seconds-minutes, in severe cases pt reports she feels it has lasted hours, but is not entirely sure if this was a spinning sensation. Her symptoms/vertigo usually are triggered due to a change in position, sometimes she feels "floaty" and repots it is "not a full blown spin." Her symptoms can then intensify with movement even without moving her head.  Movements that trigger symptoms: laying down, turning her head, bending, pt unsure about tilting her head back  PMH includes: hearing loss, chronic migraine without auroa without status migrainosus, not intractable, anxiety, depression, panic attacks, pre-syncope, muscle spasm, insomnia per pt taking B12 supplement due to hx of low B12  PAIN:  Are you having pain? No  PRECAUTIONS: Fall  WEIGHT BEARING RESTRICTIONS: No  FALLS: Has patient fallen in last 6 months? No  LIVING ENVIRONMENT:    PLOF: Independent  PATIENT GOALS: improve symptoms, balance, pt reports symptoms affecting daily activities/work  OBJECTIVE:   DIAGNOSTIC FINDINGS:   VNG testing results per referral, [place], "Dix Hallpike maneuvers were negative. There was no spontaneous or gaze nystagmus. Random saccades, smooth pursuit visual tracking and optokinetic testing were normal. No post headshake nysgamus was present. No positional nystagmus was recorded. Calorics revealed no significant weakness. Impression: Normal VNG."  Most recent imaging  per chart 06/13/2020 MR BRAIN W WO CONTRAST: "IMPRESSION: Unremarkable MRI of the brain.:  COGNITION: Overall cognitive status: Within functional limits for tasks assessed    POSTURE:  No Significant postural limitations  Cervical ROM:   Pt observed to have WFL cervical rotation and extension during session that is pain-free. Formal measurements deferred  STRENGTH: deferred   BED MOBILITY: limited occ with symptom onset (laying down can be a potential trigger)  TRANSFERS: Assistive device utilized: None  Sit to stand: Complete Independence Stand to sit: Complete Independence Comments: pt reports mobility can become severely limited when pt has dizziness symptoms due to unsteadiness. However, none at present moment limiting mobility  GAIT: Gait pattern:  currently [facility], however, pt reports when she has dizziness onset she becomes very unsteady and must sit down or at times reach out to support herself Distance walked: clinic distances Assistive device utilized: None Level of assistance: Complete Independence   FUNCTIONAL TESTS: deferred DGI and M-CTSIB to be performed next 1-2 visits   PATIENT SURVEYS:  FOTO 51 (goal 57)  VESTIBULAR ASSESSMENT:  GENERAL OBSERVATION:    SYMPTOM BEHAVIOR:  Subjective history: Onset of dizziness and unsteadiness  in 2021 with multiple mild episodes and 2 severe episodes (see above for details)  Non-Vestibular symptoms: tinnitus, nausea/vomiting, migraine symptoms, and reports hx of syncopal episodes  Type of dizziness: Imbalance (Disequilibrium) and Spinning/Vertigo  Frequency: reports multiple episodes since 2021. Pt thinks can be positionally triggered but has at times worsened even when relatively still (not moving her head) but may be changing position  Duration: reports seconds and even hours at times  Aggravating factors:  positional changes  Relieving factors:  still/rest, rescue meds  OCULOMOTOR EXAM:  Ocular Alignment:  normal  Ocular ROM: No Limitations  Spontaneous Nystagmus: absent  Gaze-Induced Nystagmus: absent  Smooth Pursuits: saccades, pt reports she can at times feel when this occurs  Saccades: intact  Convergence/Divergence: WNL  VESTIBULAR - OCULAR REFLEX:   Slow VOR: Normal  Head-Impulse Test: WNL B   POSITIONAL TESTING:  Right Dix-Hallpike: negative Left Dix-Hallpike: Performed 2x for clarity. Initial test pt with delayed onset of extremely brief jerk nystagmus that appeared rotational and possibly downbeat. Once symptoms resolved and pt sat back up, a second Dix-Hallpike test performed, pt again with delayed onset of jerk nystagmus that was rotational and clearly up-beating to L. Pt reported dizziness with testing on L side as well.   OTHOSTATICS: deferred; indicated due to hx of syncopal episodes, to be performed next 1-2 visits  FUNCTIONAL GAIT: Dynamic Gait Index: deferred   VESTIBULAR TREATMENT:                                                                                                   DATE:   TherAct: Goal testing completed for discharge visit. Pt has met all goals (see below).  Reviewed with pt lifestyle factors that can influence level of dizziness and provided additional symptom tracker handouts. Instructed pt in signs/symptoms to watch out for to determine if future referral to vestibular PT is needed. Pt also has upcoming neurology appointment in early April. PT suggested that if pt finds she also has hearing loss with vertigo episodes that further follow up with ENT could be beneficial as well. Pt verbalizes understanding for all and is agreeable to discharge on this date.  PATIENT EDUCATION: Education details: findings of testing, discharge recommendations  Person educated: Patient Education method: Explanation, Demonstration, Verbal cues, and Handouts Education comprehension: verbalized understanding  HOME EXERCISE PROGRAM: Pt to continue HEP as previously  given  09/09/22: Access Code: [email or web address] URL: [email or web address] Date: 09/09/2022 Prepared by: Ricard Dillon  Exercises - Seated Gaze Stabilization with Head Rotation  - 1 x daily - 7 x weekly - 1 sets - 3 reps - 10 seconds hold - Corner Balance Feet Together: Eyes Closed With Head Turns  - 1 x daily - 5 x weekly - 2 sets - 20 reps  GOALS: Goals reviewed with patient? No - goals to be reviewed next appointment  SHORT TERM GOALS: Target date: 01/08/2023   Patient will be independent in home exercise program to improve positionally triggered dizziness symptoms and balance to increase functional independence and safety with ADLs/job duties. Baseline: to be initiated (did provide and review  symptom tracking tool/handout);  09/09/2022: initiated VORx1 seated horizontal head turns, and standing in corner EC with vertical and horizontal head turns; 3/21: pt indep Goal status: MET   LONG TERM GOALS: Target date: 02/19/2023    Patient will increase FOTO score to equal to or greater than  57   to demonstrate statistically significant improvement in mobility and quality of life.  Baseline: 54; 11/27/22: 61 Goal status: MET  2.  Patient will report at least a 50% reduction in positionally triggered dizziness and unsteadiness symptoms in order to increase safety with ADLs, job duties, and QOL Baseline: symptoms brought on by positional changes; 3/21: pt confirms 50% reduction in positionally triggered dizziness  Goal status: NET   3.  Patient will increase dynamic gait index score to >19/24 as to demonstrate reduced fall risk and improved dynamic gait balance for better safety with community/home ambulation.   Baseline: to be completed next 1-2 visits; 09/09/2022: 23/24 11/27/22: 24/24 Goal status: DISCONTINUED/MET pt achieves greater than 19/24 first attempt, only slight impairment in gait with horizontal head turns;   ASSESSMENT:  CLINICAL IMPRESSION: Goals retested. Pt has met all  remaining therapy goals, and reports no recent instances of vertigo. Reviewed with pt lifestyle factors that can influence level of dizziness and provided additional symptom tracker handouts in session. Instructed pt in signs/symptoms to watch out for to determine if future referral to vestibular PT is needed. Pt also has upcoming neurology appointment in early April. PT suggested that if pt finds she also has hearing loss with vertigo episodes that further follow up with ENT could be beneficial as well. Pt verbalizes understanding for all and is agreeable to discharge on this date.  OBJECTIVE IMPAIRMENTS: decreased activity tolerance, decreased balance, difficulty walking, and dizziness.   ACTIVITY LIMITATIONS: bending, stairs, bed mobility, and locomotion level  PARTICIPATION LIMITATIONS: cleaning, driving, shopping, community activity, occupation, yard work, and all ADLs can be severely affected/limited when pt having dizzy episode  PERSONAL FACTORS: Sex, Time since onset of injury/illness/exacerbation, and 3+ comorbidities: PMH includes: hearing loss, chronic migraine without auroa without status migrainosus, not intractable, anxiety, depression, panic attacks, pre-syncope, muscle spasm, insomnia per pt taking B12 supplement due to hx of low B12  are also affecting patient's functional outcome.   REHAB POTENTIAL: Good  CLINICAL DECISION MAKING: Evolving/moderate complexity  EVALUATION COMPLEXITY: Moderate   PLAN:  PT FREQUENCY: one time visit  PT DURATION: other: [id number] *recert for discharge visit  PLANNED INTERVENTIONS: Therapeutic exercises, Therapeutic activity, Neuromuscular re-education, Balance training, Gait training, Patient/Family education, Self Care, Joint mobilization, Stair training, Vestibular training, Canalith repositioning, Visual/preceptual remediation/compensation, Orthotic/Fit training, DME instructions, Dry Needling, Electrical stimulation, Wheelchair mobility  training, Spinal mobilization, Cryotherapy, Moist heat, Splintting, Taping, Manual therapy, and Re-evaluation  PLAN FOR NEXT SESSION: d/c   Zollie Pee, PT 11/27/2022, 4:36 PM   4:36 PM, 11/27/22 Physical Therapist - [place] [phone]

## 2022-12-02 ENCOUNTER — Ambulatory Visit: Payer: Commercial Managed Care - PPO

## 2022-12-09 ENCOUNTER — Other Ambulatory Visit: Payer: Self-pay

## 2022-12-09 ENCOUNTER — Ambulatory Visit: Payer: Commercial Managed Care - PPO | Admitting: Neurology

## 2022-12-09 ENCOUNTER — Encounter: Payer: Self-pay | Admitting: Neurology

## 2022-12-09 VITALS — BP 133/87 | HR 106 | Ht 64.0 in | Wt 166.0 lb

## 2022-12-09 DIAGNOSIS — G43009 Migraine without aura, not intractable, without status migrainosus: Secondary | ICD-10-CM

## 2022-12-09 DIAGNOSIS — H9193 Unspecified hearing loss, bilateral: Secondary | ICD-10-CM

## 2022-12-09 DIAGNOSIS — G441 Vascular headache, not elsewhere classified: Secondary | ICD-10-CM | POA: Diagnosis not present

## 2022-12-09 DIAGNOSIS — G459 Transient cerebral ischemic attack, unspecified: Secondary | ICD-10-CM | POA: Diagnosis not present

## 2022-12-09 DIAGNOSIS — H538 Other visual disturbances: Secondary | ICD-10-CM | POA: Diagnosis not present

## 2022-12-09 DIAGNOSIS — R42 Dizziness and giddiness: Secondary | ICD-10-CM | POA: Diagnosis not present

## 2022-12-09 DIAGNOSIS — R270 Ataxia, unspecified: Secondary | ICD-10-CM

## 2022-12-09 DIAGNOSIS — G43709 Chronic migraine without aura, not intractable, without status migrainosus: Secondary | ICD-10-CM

## 2022-12-09 MED ORDER — QULIPTA 60 MG PO TABS: 60.0000 mg | ORAL_TABLET | Freq: Every day | ORAL | 11 refills | Status: DC

## 2022-12-09 MED ORDER — METHYLPREDNISOLONE 4 MG PO TBPK
ORAL_TABLET | ORAL | 1 refills | Status: DC
  Filled 2022-12-09: qty 21, 6d supply, fill #0
  Filled 2022-12-29: qty 21, 6d supply, fill #1

## 2022-12-09 NOTE — Patient Instructions (Addendum)
- Migraine: [name] with zofran and alleve acutely - Long vertiginous episodes that won;t go away: Medrol dosepak on hand and start it and can call us for migraine infusion or possible nerve blocks or take nurtec daily or we can call some benzodiazepines for a few days or all of the above - Layer on qulipta daily to see if the rest of the migraine will go away, basically start a secondary preventative.     Methylprednisolone Tablets What is this medication? METHYLPREDNISOLONE (meth ill pred NISS [place]) treats many conditions such as asthma, allergic reactions, arthritis, inflammatory bowel diseases, adrenal, and blood or bone marrow disorders. It works by decreasing inflammation, slowing down an overactive immune system, or replacing cortisol normally made in the body. Cortisol is a hormone that plays an important role in how the body responds to stress, illness, and injury. It belongs to a group of medications called steroids. This medicine may be used for other purposes; ask your health care provider or pharmacist if you have questions. COMMON BRAND NAME(S): Medrol, Medrol Dosepak What should I tell my care team before I take this medication? They need to know if you have any of these conditions: Cushing's syndrome Eye disease, vision problems Diabetes Glaucoma Heart disease High blood pressure Infection especially a viral infection, such as chickenpox, cold sores, or herpes Liver disease Mental health conditions Myasthenia gravis Osteoporosis Recent or upcoming vaccine Seizures Stomach or intestine problems Thyroid disease An unusual or allergic reaction to lactose, methylprednisolone, other medications, foods, dyes, or preservatives Pregnant or trying to get pregnant Breastfeeding How should I use this medication? Take this medication by mouth with a glass of water. Follow the directions on the prescription label. Take this medication with food. If you are taking this medication  once a day, take it in the morning. Do not take it more often than directed. Do not suddenly stop taking your medication because you may develop a severe reaction. Your care team will tell you how much medication to take. If your care team wants you to stop the medication, the dose may be slowly lowered over time to avoid any side effects. Talk to your care team about the use of this medication in children. Special care may be needed. Overdosage: If you think you have taken too much of this medicine contact a poison control center or emergency room at once. NOTE: This medicine is only for you. Do not share this medicine with others. What if I miss a dose? If you miss a dose, take it as soon as you can. If it is almost time for your next dose, talk to your care team. You may need to miss a dose or take an extra dose. Do not take double or extra doses without advice. What may interact with this medication? Do not take this medication with any of the following: Alefacept Echinacea Live virus vaccines Metyrapone Mifepristone This medication may also interact with the following: Amphotericin B Aspirin and aspirin-like medications Certain antibiotics, such as erythromycin, clarithromycin, troleandomycin Certain medications for diabetes Certain medications for fungal infections, such as ketoconazole Certain medications for seizures, such as carbamazepine, phenobarbital, phenytoin Certain medications that treat or prevent blood clots, such as warfarin Cholestyramine Cyclosporine Digoxin Diuretics Estrogen or progestin hormones Isoniazid NSAIDs, medications for pain and inflammation, such as ibuprofen or naproxen Other medications for myasthenia gravis Rifampin Vaccines This list may not describe all possible interactions. Give your health care provider a list of all the medicines, herbs, non-prescription  drugs, or dietary supplements you use. Also tell them if you smoke, drink alcohol, or use  illegal drugs. Some items may interact with your medicine. What should I watch for while using this medication? Tell your care team if your symptoms do not start to get better or if they get worse. Do not stop taking except on your care team's advice. You may develop a severe reaction. Your care team will tell you how much medication to take. This medication may increase your risk of getting an infection. Tell your care team if you are around anyone with measles or chickenpox, or if you develop sores or blisters that do not heal properly. This medication may increase blood sugar levels. Ask your care team if changes in diet or medications are needed if you have diabetes. Tell your care team right away if you have any change in your eyesight. Using this medication for a long time may increase your risk of low bone mass. Talk to your care team about bone health. What side effects may I notice from receiving this medication? Side effects that you should report to your care team as soon as possible: Allergic reactions--skin rash, itching, hives, swelling of the face, lips, tongue, or throat Cushing syndrome--increased fat around the midsection, upper back, neck, or face, pink or purple stretch marks on the skin, thinning, fragile skin that easily bruises, unexpected hair growth High blood sugar (hyperglycemia)--increased thirst or amount of urine, unusual weakness or fatigue, blurry vision Increase in blood pressure Infection--fever, chills, cough, sore throat, wounds that don't heal, pain or trouble when passing urine, general feeling of discomfort or being unwell Low adrenal gland function--nausea, vomiting, loss of appetite, unusual weakness or fatigue, dizziness Mood and behavior changes--anxiety, nervousness, confusion, hallucinations, irritability, hostility, thoughts of suicide or self-harm, worsening mood, feelings of depression Stomach bleeding--bloody or black, tar-like stools, vomiting blood  or brown material that looks like coffee grounds Swelling of the ankles, hands, or feet Side effects that usually do not require medical attention (report to your care team if they continue or are bothersome): Acne General discomfort and fatigue Headache Increase in appetite Nausea Trouble sleeping Weight gain This list may not describe all possible side effects. Call your doctor for medical advice about side effects. You may report side effects to FDA at 1-800-FDA-1088. Where should I keep my medication? Keep out of the reach of children and pets. Store at room temperature between 20 and 25 degrees C (68 and 77 degrees F). Throw away any unused medication after the expiration date. NOTE: This sheet is a summary. It may not cover all possible information. If you have questions about this medicine, talk to your doctor, pharmacist, or health care provider.  2023 [employer] (2020-10-29 00:00:00)

## 2022-12-09 NOTE — Progress Notes (Signed)
[id number]    Provider:  Dr Jaynee Eagles Requesting Provider: Gladstone Lighter, MD Primary Care Provider:  Gladstone Lighter, MD  CC:  Migraines    12/09/2022: Patient has been getting botox for migraines, here for f/u on treatment. She has episodes of vertigo. Blurry vision. Last flare ups were June/July of last year and then November/December and started going to vestibular therapy. The one in the summer she was vomiting, no headache or migraines pain but the vertigo, VNG was neg(she was not symptomatic). Candice [name] was positive and they did an epley. They can last hours or days. Symptoms last for a week or so, with the last two times didn't get worse positionally, wasn't due to head turning, would wax and wane, the room would spin with vomiting. There are other times in the past it was more of head turning and BPPV. Likely a combination of BPPV and vertigo associated with migraine can't rule out labyrinthitis or neuritis gave literature. Laying in a dark room last December would help.   Patient complains of symptoms per HPI as well as the following symptoms: vertigo . Pertinent negatives and positives per HPI. All others negative   10/30/22: Botox continues to work well.  After the last injection she developed a mild headache with nausea and vomiting.  This lasted about 6 days and then resolved.  She never had the symptoms after any previous Botox.  Advised patient that I did not feel that this was related to her Botox injections.  Also discussed with Dr. Rexene Alberts who did not feel this would be related to her Botox injections   From a thorough review of records, medications tried that can be used in migraine management include: Tylenol, Fioricet, Flexeril, Benadryl, eletriptan, Lexapro, ibuprofen, sumatriptan, ketorolac, meclizine, Methergine, Reglan, metoprolol, nifedipine (calcium channel blocker similar to verapamil), Nurtec, Zofran, Phenergan, propranolol, Nurtec, scopolamine  patch, Imitrex, sumatriptan injection, Topamax, Ubrelvy, venlafaxine, Zomig, lidocaine, maxalt, amitriptyline/nortriptyline, propranolol, maxalt, ubrelvy, nurtec, botox  08/04/22: Reports that Botox works well except for the last 2 weeks if she starts to have breakthrough headaches.  She states that she typically has 2 mild headaches during the week that she can take Excedrin Migraine.  Has not had any severe headaches.  Tries to take Nurtec as a preventative medicine 2 weeks before her Botox is due.  Will use [name] as an abortive medication if needed  05/06/22: Botox is working well.  She is reports that she has been having trouble with vertigo not associated with her migraines.  Will be start vestibular rehab  02/06/22: Tried nurtec daily for the last week but not sure it helped? Maybe knocked the edge off. Otherwise her headaches are controlled. Has a mild headache 1-2 times a week. Usually will try OTC medicine first then will use ubrelvy. Encouraged her to use the ubrelvy first thing.  11/12/20: Patient states that Botox is working well for her.  She states the week before her Botox is due she tends to have more headaches.  She is using the Ubrelvy and sometimes nurtrec. Advised that she could try taking nurtec daily 5 days before her cycle to try to prevent migraines.   HPI 04/2021:  Candice [name] is a 37 y.o. female here as requested by Candice Lighter, MD for migraines. She is a friend of Candice [name]. Started between 15-20 years ago. Mother has migraines. She is on continuous birth control and that really helps. [name] is helping. Relpax used to help but not covered anymore. She  is also having vertigo episodes. She has hearing loss. Her vertigo can be severe. ENT BPPV and +dix hallpike and did the epley. Usually the migraines start at the right temple and can then spread to the right eye, there is intense pressure, throbbing/pulsating/pounding,photo/phono/osmophobia, smells can trigger, nausea,  vomiting, slight dizziness. Vertigo is resolved. She has 15 moderately to severe migraines days a week for > 1 year can last 12-24 hours each. She is aware of medication rebound and is careful, no medication overuse. No aura. Severe migraines 6 days a month, and at least 9 of more moderately. She has other headaches most of the month. An ice pak helps. A dark room helps. Emgality did not help. [name] does work. +nausea. + movement makes it worse.  No other focal neurologic deficits, associated symptoms, inciting events or modifiable factors.  Candice Schaumann PA: [facility] [place].   Reviewed notes, labs and imaging from outside physicians, which showed:    June 13, 2020: Vitamin B12 is 303, CMP normal, CBC normal, TSH normal  MRI of the brain October 2019 and October 2021 with and without contrast with thin cuts through the internal auditory canals both normal.  Personally reviewed images.   Review of Systems: Patient complains of symptoms per HPI as well as the following symptoms headaches. Pertinent negatives and positives per HPI. All others negative.   Social History   Socioeconomic History   Marital status: Married    Spouse name: Not on file   Number of children: 1   Years of education: Not on file   Highest education level: Not on file  Occupational History   Not on file  Tobacco Use   Smoking status: Never   Smokeless tobacco: Never  Vaping Use   Vaping Use: Never used  Substance and Sexual Activity   Alcohol use: Yes    Alcohol/week: 0.0 standard drinks of alcohol    Comment: maybe two drinks/month.   Drug use: No   Sexual activity: Yes    Birth control/protection: Pill  Other Topics Concern   Not on file  Social History Narrative   Lives in [place] with her husband and 65 yr old daughter.  Does not routinely exercise.   Caffeine: 1 cup coffee/day, some soda , 1-2 cups of caffeine/day   Social Determinants of Health   Financial  Resource Strain: Low Risk  (07/13/2018)   Overall Financial Resource Strain (CARDIA)    Difficulty of Paying Living Expenses: Not hard at all  Food Insecurity: No Food Insecurity (07/13/2018)   Hunger Vital Sign    Worried About Running Out of Food in the Last Year: Never true    Ran Out of Food in the Last Year: Never true  Transportation Needs: No Transportation Needs (07/13/2018)   PRAPARE - Hydrologist (Medical): No    Lack of Transportation (Non-Medical): No  Physical Activity: Inactive (07/13/2018)   Exercise Vital Sign    Days of Exercise per Week: 0 days    Minutes of Exercise per Session: 0 min  Stress: Not on file  Social Connections: Unknown (07/13/2018)   Social Connection and Isolation Panel [NHANES]    Frequency of Communication with Friends and Family: Not on file    Frequency of Social Gatherings with Friends and Family: Not on file    Attends Religious Services: Not on file    Active Member of Clubs or Organizations: Not on file  Attends Archivist Meetings: Not on file    Marital Status: Married  Human resources officer Violence: Not on file    Family History  Problem Relation Age of Onset   Depression Mother    Anxiety disorder Mother    Heart attack Mother        NSTEMI @ age 67.   Migraines Mother    Sleep apnea Father        alive @ 48.   Anxiety disorder Maternal Grandmother    Depression Maternal Grandmother    Diabetes Maternal Grandmother    Hypertension Maternal Grandmother    Heart attack Maternal Grandfather    Alcohol abuse Paternal Grandfather    Suicidality Paternal Grandfather    Seizures Daughter     Past Medical History:  Diagnosis Date   Anxiety    Depression    Hearing loss    Migraines    a. x 10+ years.   Postpartum care following cesarean delivery (5/29) 02/04/2014   Pre-syncope    a. 07/2015 [facility] ED visit, ? vasovagal, improved with IVF.   Short cervix in second trimester, antepartum  11/16/2013    Patient Active Problem List   Diagnosis Date Noted   MDD (major depressive disorder), recurrent episode, mild 10/30/2020   Insomnia due to mental condition 07/14/2019   Anxiety 05/13/2019   MDD (major depressive disorder), recurrent, in full remission 04/08/2019   Panic attacks 04/08/2019   MDD (major depressive disorder), recurrent, in partial remission 04/08/2019   Stress at home 12/19/2016   Muscle spasm 06/06/2016   Migraine without aura and with status migrainosus, not intractable 11/23/2015   Migraine without aura and without status migrainosus, not intractable 09/11/2015   Chronic migraine without aura without status migrainosus, not intractable 09/11/2015   Tachycardia 08/27/2015   Depression, major, recurrent, moderate ([place]) 02/19/2015   Postpartum care following cesarean delivery (5/29) 02/04/2014   Indication for care in labor or delivery 02/03/2014   Short cervix in second trimester, antepartum 11/16/2013   Antepartum bleeding, second trimester 11/15/2013    Past Surgical History:  Procedure Laterality Date   CESAREAN SECTION N/A 02/03/2014   Procedure: Primary Cesarean Section Delivery Baby Girl @ 2244, Apgars 9/9;  Surgeon: Marvene Staff, MD;  Location: [facility];  Service: Obstetrics;  Laterality: N/A;   CESAREAN SECTION     WISDOM TOOTH EXTRACTION  2002    Current Outpatient Medications  Medication Sig Dispense Refill   ALPRAZolam (XANAX) 0.25 MG tablet Take 1 tablet (0.25 mg total) by mouth at bedtime as needed for anxiety. 15 tablet 0   Atogepant (QULIPTA) 60 MG TABS Take 1 tablet (60 mg total) by mouth daily. 30 tablet 11   buPROPion (WELLBUTRIN SR) 150 MG 12 hr tablet Take 1 tablet (150 mg total) by mouth 2 (two) times daily. Take daily at 8:30 AM and at 2 PM 180 tablet 0   cyanocobalamin (,VITAMIN B-12,) 1000 MCG/ML injection Inject 1 mL (1,000 mcg total) into the muscle monthly for 4 doses 1 mL 3   cyanocobalamin (VITAMIN B12) 1000 MCG/ML  injection Inject 1 mL (1,000 mcg total) into the muscle every 30 (thirty) days. 1 mL 5   ergocalciferol (VITAMIN D2) 1.25 MG (50000 UT) capsule Take 1 capsule (50,000 Units total) by mouth once a week. 12 capsule 1   escitalopram (LEXAPRO) 20 MG tablet Take 20 mg by mouth daily.     levonorgestrel-ethinyl estradiol (NORDETTE) 0.15-30 MG-MCG tablet Take 1 tablet by mouth once  daily. Take active pills ONLY 112 tablet 1   methylPREDNISolone (MEDROL DOSEPAK) 4 MG TBPK tablet Take 6 tablets (24 mg total) by mouth daily for 1 day, THEN 5 tablets (20 mg total) daily for 1 day, THEN 4 tablets (16 mg total) daily for 1 day, THEN 3 tablets (12 mg total) daily for 1 day, THEN 2 tablets (8 mg total) daily for 1 day, THEN 1 tablet (4 mg total) daily for 1 day. Take with food. 21 tablet 1   ondansetron (ZOFRAN-ODT) 8 MG disintegrating tablet Dissolve 1 tablet (8 mg total) in mouth every 8 (eight) hours as needed. 20 tablet 2   propranolol ER (INDERAL LA) 60 MG 24 hr capsule Take 1 capsule (60 mg total) by mouth at bedtime. 30 capsule 1   Rimegepant Sulfate (NURTEC) 75 MG TBDP Take 1 tablet by mouth daily as needed. 10 tablet 2   Tuberculin-Allergy Syringes (VANISHPOINT TUBERCULIN SYRINGE) 25G X 1" 1 ML MISC Use as directed for up to 30 days 1 each 3   Tuberculin-Allergy Syringes (VANISHPOINT TUBERCULIN SYRINGE) 25G X 1" 1 ML MISC Use as directed (for b12 inj) for up to 6 doses 1 each 5   Ubrogepant (UBRELVY) 100 MG TABS Take at the onset of migraine. Can repeat in 2 hours if needed. Only 2 tabs in 24 hours. 10 tablet 12   No current facility-administered medications for this visit.    Allergies as of 12/09/2022 - Review Complete 12/09/2022  Allergen Reaction Noted   Elemental sulfur Hives 06/24/2013   Sulfa antibiotics Other (See Comments) and Hives 02/19/2015    Vitals: BP 133/87 (BP Location: Right Arm, Patient Position: Sitting)   Pulse (!) 106   Ht 5\' 4"  (1.626 m)   Wt 166 lb (75.3 kg)   BMI 28.49  kg/m  Last Weight:  Wt Readings from Last 1 Encounters:  12/09/22 166 lb (75.3 kg)   Last Height:   Ht Readings from Last 1 Encounters:  12/09/22 5\' 4"  (1.626 m)   Physical exam: Exam: Gen: NAD, conversant, well nourised, obese, well groomed                     CV: RRR, no MRG. No Carotid Bruits. No peripheral edema, warm, nontender Eyes: Conjunctivae clear without exudates or hemorrhage  Neuro: Detailed Neurologic Exam  Speech:    Speech is normal; fluent and spontaneous with normal comprehension.  Cognition:    The patient is oriented to person, place, and time;     recent and remote memory intact;     language fluent;     normal attention, concentration,     fund of knowledge Cranial Nerves:    The pupils are equal, [name], and reactive to light. The fundi are normal and spontaneous venous pulsations are present. Visual fields are full to finger confrontation. Extraocular movements are intact. Trigeminal sensation is intact and the muscles of mastication are normal. The face is symmetric. The palate elevates in the midline. Hearing intact. Voice is normal. Shoulder shrug is normal. The tongue has normal motion without fasciculations.   Coordination: nml  Gait: nml  Motor Observation:    No asymmetry, no atrophy, and no involuntary movements noted. Tone:    Normal muscle tone.    Posture:    Posture is normal. normal erect    Strength:    Strength is V/V in the upper and lower limbs.      Sensation: intact to LT  Reflex Exam:  DTR's:    Deep tendon reflexes in the upper and lower extremities are normal bilaterally.   Toes:    The toes are downgoing bilaterally.   Clonus:    Clonus is absent.     Assessment/Plan:  She has 4 migraines a month since started botox and she takes [place]. She has episodes of extreme vertigo that can last for a week, confusion, dizziness, not associated with head pain. Discussed plan when happens. Wide differential TIA,  stroke, scwannoma, BPPV , migraine can't rule out labyrinthitis or neuritis or schwannoma has hearing loss as well  - Migraine: Ubrelvy with zofran and alleve acutely - Long vertiginous episodes that won;t go away: Medrol dosepak on hand and start it and can call us for migraine infusion or possible nerve blocks or take nurtec daily or we can call some benzodiazepines for a few days or all of the above - Layer on qulipta daily to see if the rest of the migraine will go away, basically start a secondary preventative.   MRI brain w/wo contrast for vertigo, hearing loss worse on left, ataxia, r/o TIA vs stroke, acoustic neuroma or other, thin cuts through IACs.Have her here on the bus and tell her the price.   Orders Placed This Encounter  Procedures   MR BRAIN W WO CONTRAST   Meds ordered this encounter  Medications   methylPREDNISolone (MEDROL DOSEPAK) 4 MG TBPK tablet    Sig: Take 6 tablets (24 mg total) by mouth daily for 1 day, THEN 5 tablets (20 mg total) daily for 1 day, THEN 4 tablets (16 mg total) daily for 1 day, THEN 3 tablets (12 mg total) daily for 1 day, THEN 2 tablets (8 mg total) daily for 1 day, THEN 1 tablet (4 mg total) daily for 1 day. Take with food.    Dispense:  21 tablet    Refill:  1   Atogepant (QULIPTA) 60 MG TABS    Sig: Take 1 tablet (60 mg total) by mouth daily.    Dispense:  30 tablet    Refill:  11    5 migraine days a month and < 10 total headache dys/month. Tried eletriptan, ibuprofen, Nurtec, ubrelvy, botox, Imitrex,  venlafaxine, Zomig, amitriptyline, propranolol, maxalt, Topamax, emgality      Discussed: To prevent or relieve headaches, try the following: Cool Compress. Lie down and place a cool compress on your head.  Avoid headache triggers. If certain foods or odors seem to have triggered your migraines in the past, avoid them. A headache diary might help you identify triggers.  Include physical activity in your daily routine. Try a daily walk or  other moderate aerobic exercise.  Manage stress. Find healthy ways to cope with the stressors, such as delegating tasks on your to-do list.  Practice relaxation techniques. Try deep breathing, yoga, massage and visualization.  Eat regularly. Eating regularly scheduled meals and maintaining a healthy diet might help prevent headaches. Also, drink plenty of fluids.  Follow a regular sleep schedule. Sleep deprivation might contribute to headaches Consider biofeedback. With this mind-body technique, you learn to control certain bodily functions -- such as muscle tension, heart rate and blood pressure -- to prevent headaches or reduce headache pain.    Proceed to emergency room if you experience new or worsening symptoms or symptoms do not resolve, if you have new neurologic symptoms or if headache is severe, or for any concerning symptom.   Provided education and documentation from American headache  Society toolbox including articles on: chronic migraine medication overuse headache, chronic migraines, prevention of migraines, behavioral and other nonpharmacologic treatments for headache.   Orders Placed This Encounter  Procedures   MR BRAIN W WO CONTRAST    Meds ordered this encounter  Medications   methylPREDNISolone (MEDROL DOSEPAK) 4 MG TBPK tablet    Sig: Take 6 tablets (24 mg total) by mouth daily for 1 day, THEN 5 tablets (20 mg total) daily for 1 day, THEN 4 tablets (16 mg total) daily for 1 day, THEN 3 tablets (12 mg total) daily for 1 day, THEN 2 tablets (8 mg total) daily for 1 day, THEN 1 tablet (4 mg total) daily for 1 day. Take with food.    Dispense:  21 tablet    Refill:  1   Atogepant (QULIPTA) 60 MG TABS    Sig: Take 1 tablet (60 mg total) by mouth daily.    Dispense:  30 tablet    Refill:  11    5 migraine days a month and < 10 total headache dys/month. Tried eletriptan, ibuprofen, Nurtec, ubrelvy, botox, Imitrex,  venlafaxine, Zomig, amitriptyline, propranolol, maxalt,  Topamax, emgality     Cc: Candice Lighter, MD,  Candice Lighter, MD  Sarina Ill, MD  [facility] [address] [place], [place] [address]  Phone [phone] Fax [phone]  I spent over 60  minutes of face-to-face and non-face-to-face time with patient on the  1. Migraine without aura and without status migrainosus, not intractable   2. Chronic migraine without aura without status migrainosus, not intractable   3. Bilateral hearing loss, unspecified hearing loss type   4. Vertigo   5. Ataxia   6. Other vascular headache   7. TIA (transient ischemic attack)   8. Blurry vision, bilateral     diagnosis.  This included previsit chart review, lab review, study review, order entry, electronic health record documentation, patient education on the different diagnostic and therapeutic options, counseling and coordination of care, risks and benefits of management, compliance, or risk factor reduction

## 2022-12-11 ENCOUNTER — Other Ambulatory Visit: Payer: Self-pay | Admitting: Psychiatry

## 2022-12-11 ENCOUNTER — Other Ambulatory Visit: Payer: Self-pay

## 2022-12-11 ENCOUNTER — Other Ambulatory Visit: Payer: Self-pay | Admitting: Adult Health

## 2022-12-11 DIAGNOSIS — F3342 Major depressive disorder, recurrent, in full remission: Secondary | ICD-10-CM

## 2022-12-11 MED ORDER — BUPROPION HCL ER (SR) 150 MG PO TB12
150.0000 mg | ORAL_TABLET | Freq: Two times a day (BID) | ORAL | 0 refills | Status: DC
  Filled 2022-12-11: qty 180, 90d supply, fill #0

## 2022-12-11 MED ORDER — NURTEC 75 MG PO TBDP
1.0000 | ORAL_TABLET | Freq: Every day | ORAL | 1 refills | Status: DC | PRN
  Filled 2022-12-11: qty 10, 30d supply, fill #0
  Filled 2023-01-07: qty 10, 30d supply, fill #1

## 2022-12-17 ENCOUNTER — Ambulatory Visit: Payer: Commercial Managed Care - PPO | Admitting: Psychiatry

## 2022-12-24 ENCOUNTER — Ambulatory Visit (HOSPITAL_COMMUNITY): Payer: Commercial Managed Care - PPO | Admitting: Licensed Clinical Social Worker

## 2022-12-24 DIAGNOSIS — F418 Other specified anxiety disorders: Secondary | ICD-10-CM

## 2022-12-24 DIAGNOSIS — F330 Major depressive disorder, recurrent, mild: Secondary | ICD-10-CM | POA: Diagnosis not present

## 2022-12-24 NOTE — Progress Notes (Signed)
Virtual Visit via Video Note  I connected with [name] on 12/24/22 at  4:00 PM EDT by a video enabled telemedicine application and verified that I am speaking with the correct person using two identifiers.  Patient provides verbal consent for her husband, Candice [name], to be present throughout session.  Location: Patient: home Provider: remote office [place], [place])   I discussed the limitations of evaluation and management by telemedicine and the availability of in person appointments. The patient expressed understanding and agreed to proceed.  I discussed the assessment and treatment plan with the patient. The patient was provided an opportunity to ask questions and all were answered. The patient agreed with the plan and demonstrated an understanding of the instructions.   The patient was advised to call back or seek an in-person evaluation if the symptoms worsen or if the condition fails to improve as anticipated.  I provided 60 minutes of non-face-to-face time during this encounter.   Wannetta Langland R Fletcher Ostermiller, LCSW   THERAPIST PROGRESS NOTE  Session Time: 4-5p  Participation Level: Active  Behavioral Response: Neat and Well GroomedAlertAnxious  Type of Therapy: Family Therapy  Treatment Goals addressed: Decrease depressive symptoms and improve levels of effective functioning-pt reports a decrease in overall depression symptoms 3 out of 5 sessions documented   Develop healthy thinking patterns and beliefs about self, others, and the world that lead to the alleviation and help prevent the relapse of depression per self report 3 out of 5 sessions documented   ProgressTowards Goals: Progressing  Interventions: CBT, Motivational Interviewing, and Supportive  Summary: Candice [name] is a 37 y.o. female who presents with continuing symptoms related to depression.  Patient presents with her husband throughout session, and he is an active participant.  Patient reports  that at this time she still has not started taking the Effexor or that was recommended at a previous psychiatric appointment.  Patient would like to discuss her hesitation with her psychiatrist at her next appointment.  Patient and her husband were both engaged throughout session, and were very open and respectful of each other.  The initial presenting concern included sadness over the recent death of their dog, which the couple spent over $11,000 trying to save.  Allow both patient and her husband to explore their thoughts and feelings associated with this incident, and discussed psychological impact.  Patient's husband stated that he is worried about patient's mental health, and how this could possibly be a trigger.  Patient denies any suicidal ideation.  Discussed safety plan that was drafted with patient at last session with her husband.  Encouraged both of them to pull it up and review.  Clinician informed husband of local crisis resources, and encouraged him to take his wife there if he feels that that is necessary.  Patient agrees with this.  Patient and husband both report that coping/dealing with their daughters recent seizure disorder diagnosis is also an immediate external stressor.  Patient reports that she gives her daughter 7 pills in the morning and 6 pills in the evening, and does not think that her daughter will grow out of this.  Assisted patient and her husband with identifying positive things that have happened recently--they are both very excited about participating in a wedding in 1 week.  Also they are both very happy about patient's husband recently getting a job at the hospital, so they are currently working in the same area.  Patient feels will be good for the relationship because they can  take breaks together or go to lunch together on a regular basis.  Allowed both parties to discuss intimacy concerns, and brainstormed with the patient and her husband ways of intimacy building,  and improving overall communication. Red light/green light.  Concerns/challenges:  his relationship w/ brother has effect on him. They have been together since birth. He doesn't feel it will resolve.   Accepting relaitonship w/ brother. Looked at facebook--me and him.  January (last communication). He said he was "done with me". He started up about going to moms for christmas. He wasn't invited. His mom and stepdad invited [place].  Already did something with brother for christmas.  He has tried to manipulate things and even between [place].   Allowed the patient's husband to explore recent conflict between himself and his twin brother, and how that conflict has impacted the overall family.  Patient's husband states that his twin brother made the statement to him that he did not want him contacting him or them to spend any additional time together, so patient's husband is just doing what his brother had expressed that he wanted him to do.  Reviewed 5 love languages.  Continued recommendations are as follows: self care behaviors, positive social engagements, focusing on overall work/home/life balance, and focusing on positive physical and emotional wellness.   Suicidal/Homicidal: No. Reviewed safety plan with both pt and husband.   Therapist Response: Pt is continuing to apply interventions learned in session into daily life situations. Pt is currently on track to meet goals utilizing interventions mentioned above. Personal growth and progress noted. Treatment to continue as indicated.   Plan: Return again in 4 weeks.  Diagnosis:  Encounter Diagnoses  Name Primary?   MDD (major depressive disorder), recurrent episode, mild Yes   Other specified anxiety disorders    Collaboration of Care: Other Pt encouraged to continue psychiatric care with Dr. Elna Breslow  Patient/Guardian was advised Release of Information must be obtained prior to any record release in order to collaborate their care with an outside  provider. Patient/Guardian was advised if they have not already done so to contact the registration department to sign all necessary forms in order for [place] to release information regarding their care.   Consent: Patient/Guardian gives verbal consent for treatment and assignment of benefits for services provided during this visit. Patient/Guardian expressed understanding and agreed to proceed.   Ernest Haber Vannah Nadal, LCSW 12/24/2022

## 2022-12-26 ENCOUNTER — Encounter: Payer: Self-pay | Admitting: Neurology

## 2022-12-29 ENCOUNTER — Other Ambulatory Visit: Payer: Self-pay

## 2022-12-29 MED ORDER — METHYLPREDNISOLONE 4 MG PO TBPK
ORAL_TABLET | ORAL | 1 refills | Status: AC
  Filled 2022-12-29: qty 21, 6d supply, fill #0

## 2023-01-07 ENCOUNTER — Telehealth: Payer: Self-pay | Admitting: Neurology

## 2023-01-07 DIAGNOSIS — R42 Dizziness and giddiness: Secondary | ICD-10-CM | POA: Diagnosis not present

## 2023-01-07 NOTE — Telephone Encounter (Signed)
Pt states she was told by Dr Lucia Gaskins that the next time she has had a long stent of vertigo to not message but call in to ask about an infusion or nerve block.  Pt would like to discuss these options.

## 2023-01-07 NOTE — Telephone Encounter (Signed)
Pt scheduled for MR brain/IAC w/wo contrast at [facility] for 01/14/23 at 3:30pm  [facility] [employer] no auth required

## 2023-01-07 NOTE — Telephone Encounter (Signed)
Spoke with Dr Lucia Gaskins. She will see if infusion has an opening today.

## 2023-01-07 NOTE — Telephone Encounter (Signed)
[facility] can see patient at 1:30 pm. Dr Lucia Gaskins would like for patient to try Depacon 1 g IV x 1 to see if this helps.    I spoke with the patient.  She states that she did have a headache from April 11 April 20.  She reports her vertigo started on 425 and is still present today.  Monday night she went out to eat and could not even stay because the smell of the food was bothering her.  She states she started taking Qulipta on 4/29 right after she received it.  She took a Nurtec, Excedrin and Phenergan yesterday because she did have some breakthrough pain. Today she does not have a headache.  The patient states she misplaced a steroid Dosepak that she had on hand so she will be refilling today.  I did advise her not to start it yet because we need to see if the Depacon helps her first.  She will give [place] an update via MyChart tomorrow.  She will come today at 1:30 PM for infusion.  She confirmed her only allergy is sulfa.  Order for Depacon 1 gram IV x 1 signed by Dr Lucia Gaskins and I gave it to [facility].

## 2023-01-08 ENCOUNTER — Other Ambulatory Visit: Payer: Self-pay

## 2023-01-14 ENCOUNTER — Ambulatory Visit: Payer: Commercial Managed Care - PPO

## 2023-01-14 ENCOUNTER — Other Ambulatory Visit: Payer: Self-pay | Admitting: Adult Health

## 2023-01-14 ENCOUNTER — Other Ambulatory Visit: Payer: Self-pay

## 2023-01-14 ENCOUNTER — Telehealth: Payer: Self-pay | Admitting: *Deleted

## 2023-01-14 DIAGNOSIS — R42 Dizziness and giddiness: Secondary | ICD-10-CM | POA: Diagnosis not present

## 2023-01-14 DIAGNOSIS — G459 Transient cerebral ischemic attack, unspecified: Secondary | ICD-10-CM

## 2023-01-14 DIAGNOSIS — H538 Other visual disturbances: Secondary | ICD-10-CM

## 2023-01-14 DIAGNOSIS — H9193 Unspecified hearing loss, bilateral: Secondary | ICD-10-CM | POA: Diagnosis not present

## 2023-01-14 DIAGNOSIS — G441 Vascular headache, not elsewhere classified: Secondary | ICD-10-CM

## 2023-01-14 DIAGNOSIS — R270 Ataxia, unspecified: Secondary | ICD-10-CM

## 2023-01-14 MED ORDER — ONDANSETRON 8 MG PO TBDP
8.0000 mg | ORAL_TABLET | Freq: Three times a day (TID) | ORAL | 2 refills | Status: DC | PRN
  Filled 2023-01-14: qty 20, 7d supply, fill #0
  Filled 2023-02-25: qty 20, 7d supply, fill #1
  Filled 2023-06-08: qty 20, 7d supply, fill #2

## 2023-01-14 MED ORDER — GADOBENATE DIMEGLUMINE 529 MG/ML IV SOLN
15.0000 mL | Freq: Once | INTRAVENOUS | Status: AC | PRN
  Administered 2023-01-14: 15 mL via INTRAVENOUS

## 2023-01-14 NOTE — Telephone Encounter (Signed)
Completed Qulipta PA on CMM. Key: [id number]. Approved by [employer] initially through 07/17/23. Approval letter faxed to [facility]. Received a receipt of confirmation.

## 2023-01-15 ENCOUNTER — Other Ambulatory Visit: Payer: Self-pay

## 2023-01-15 MED ORDER — VALACYCLOVIR HCL 1 G PO TABS
2000.0000 mg | ORAL_TABLET | Freq: Two times a day (BID) | ORAL | 0 refills | Status: DC
  Filled 2023-01-15: qty 10, 3d supply, fill #0

## 2023-02-03 ENCOUNTER — Ambulatory Visit (INDEPENDENT_AMBULATORY_CARE_PROVIDER_SITE_OTHER): Payer: Commercial Managed Care - PPO | Admitting: Licensed Clinical Social Worker

## 2023-02-03 ENCOUNTER — Other Ambulatory Visit: Payer: Self-pay

## 2023-02-03 DIAGNOSIS — F330 Major depressive disorder, recurrent, mild: Secondary | ICD-10-CM | POA: Diagnosis not present

## 2023-02-03 DIAGNOSIS — H5213 Myopia, bilateral: Secondary | ICD-10-CM | POA: Diagnosis not present

## 2023-02-03 NOTE — Progress Notes (Signed)
Virtual Visit via Video Note  I connected with Candice [name] on 02/03/23 at  4:00 PM EDT by a video enabled telemedicine application and verified that I am speaking with the correct person using two identifiers.  Patient provides verbal consent for her husband, Candice [name], to be present throughout session.  Location: Patient: home Provider: remote office [place], [place])   I discussed the limitations of evaluation and management by telemedicine and the availability of in person appointments. The patient expressed understanding and agreed to proceed.  I discussed the assessment and treatment plan with the patient. The patient was provided an opportunity to ask questions and all were answered. The patient agreed with the plan and demonstrated an understanding of the instructions.   The patient was advised to call back or seek an in-person evaluation if the symptoms worsen or if the condition fails to improve as anticipated.  I provided 60 minutes of non-face-to-face time during this encounter.   Candice [name] R Candice Brashears, LCSW   THERAPIST PROGRESS NOTE  Session Time: 4-5p  Participation Level: Active  Behavioral Response: Neat and Well GroomedAlertAnxious  Type of Therapy: Family Therapy  Treatment Goals addressed: Decrease depressive symptoms and improve levels of effective functioning-pt reports a decrease in overall depression symptoms 3 out of 5 sessions documented   Develop healthy thinking patterns and beliefs about self, others, and the world that lead to the alleviation and help prevent the relapse of depression per self report 3 out of 5 sessions documented   ProgressTowards Goals: Progressing  Interventions: CBT, Motivational Interviewing, and Supportive  Summary: Candice [name] is a 37 y.o. female who presents with continuing symptoms related to depression.  Patient presents with her husband throughout session, and he is an active participant.    Trip to  [place]. Took daughter to stay w/ parents. Vet did in home euthenasia. Cremation service came to pick him up.   Work going "ok".  New supervisor--in role 6 months.   Daughter: "her hair is falling out".  Mom is bad about exggerating   Mood: okay.  Need to reschedule eappen.  Tired in the afternoons at times.   Relationship wise--communicating well. Intimacy: not really communicating better.   I feel like I need to be3 more confident in my sexuality and try new things in the bedroom. Headaches. Botox for headaches. Vertigo. Migraine cocktail infusion. Added a daily med.   [place]: only 2 headaches. Husband enjoying new job.   Stress management: spending time with friend, nails done, naps,   MIL--is avable to see the daughter--obese and hard to take her to do things.     Continued recommendations are as follows: self care behaviors, positive social engagements, focusing on overall work/home/life balance, and focusing on positive physical and emotional wellness.   Suicidal/Homicidal: No. Reviewed safety plan with both pt and husband.   Therapist Response: Pt is continuing to apply interventions learned in session into daily life situations. Pt is currently on track to meet goals utilizing interventions mentioned above. Personal growth and progress noted. Treatment to continue as indicated.   Plan: Return again in 4 weeks.  Diagnosis:  No diagnosis found.  Collaboration of Care: Other Pt encouraged to continue psychiatric care with Dr. Elna Breslow  Patient/Guardian was advised Release of Information must be obtained prior to any record release in order to collaborate their care with an outside provider. Patient/Guardian was advised if they have not already done so to contact the registration department to sign all necessary forms in  order for [place] to release information regarding their care.   Consent: Patient/Guardian gives verbal consent for treatment and assignment of benefits for services  provided during this visit. Patient/Guardian expressed understanding and agreed to proceed.   Candice [name] Candice Cashion, LCSW 02/03/2023

## 2023-02-04 ENCOUNTER — Ambulatory Visit: Payer: Commercial Managed Care - PPO

## 2023-02-04 ENCOUNTER — Other Ambulatory Visit: Payer: Self-pay

## 2023-02-04 MED ORDER — LEVONORGESTREL-ETHINYL ESTRAD 0.15-30 MG-MCG PO TABS
1.0000 | ORAL_TABLET | Freq: Every day | ORAL | 1 refills | Status: DC
  Filled 2023-02-04: qty 112, 84d supply, fill #0
  Filled 2023-04-27: qty 112, 84d supply, fill #1
  Filled 2023-05-01: qty 28, 28d supply, fill #2
  Filled 2023-05-01: qty 84, 84d supply, fill #1

## 2023-02-05 ENCOUNTER — Other Ambulatory Visit: Payer: Self-pay

## 2023-02-05 ENCOUNTER — Ambulatory Visit (INDEPENDENT_AMBULATORY_CARE_PROVIDER_SITE_OTHER): Payer: Commercial Managed Care - PPO | Admitting: Adult Health

## 2023-02-05 DIAGNOSIS — G43709 Chronic migraine without aura, not intractable, without status migrainosus: Secondary | ICD-10-CM | POA: Diagnosis not present

## 2023-02-05 MED ORDER — NURTEC 75 MG PO TBDP
1.0000 | ORAL_TABLET | Freq: Every day | ORAL | 11 refills | Status: DC | PRN
  Filled 2023-02-05: qty 8, 16d supply, fill #0
  Filled 2023-03-09: qty 8, 16d supply, fill #1
  Filled 2023-04-27: qty 8, 16d supply, fill #2
  Filled 2023-06-08 – 2023-10-04 (×6): qty 8, 16d supply, fill #3
  Filled 2023-11-07: qty 8, 16d supply, fill #4
  Filled 2023-12-08: qty 8, 16d supply, fill #5
  Filled 2024-01-12: qty 8, 16d supply, fill #6

## 2023-02-05 MED ORDER — ONABOTULINUMTOXINA 200 UNITS IJ SOLR
155.0000 [IU] | Freq: Once | INTRAMUSCULAR | Status: AC
Start: 2023-02-05 — End: 2023-02-05
  Administered 2023-02-05: 155 [IU] via INTRAMUSCULAR

## 2023-02-05 NOTE — Progress Notes (Signed)
02/05/23: Botox works well.  Also taking Qulipta daily.  She has Nurtec and Ubrelvy for abortive therapy.  She is advised not to take these medications together.  She saw Dr. Lucia Gaskins in April for an office visit  10/30/22: Botox continues to work well.  After the last injection she developed a mild headache with nausea and vomiting.  This lasted about 6 days and then resolved.  She never had the symptoms after any previous Botox.  Advised patient that I did not feel that this was related to her Botox injections.  Also discussed with Dr. Frances Furbish who did not feel this would be related to her Botox injections  08/04/22: Reports that Botox works well except for the last 2 weeks if she starts to have breakthrough headaches.  She states that she typically has 2 mild headaches during the week that she can take Excedrin Migraine.  Has not had any severe headaches.  Tries to take Nurtec as a preventative medicine 2 weeks before her Botox is due.  Will use Bernita Raisin as an abortive medication if needed  05/06/22: Botox is working well.  She is reports that she has been having trouble with vertigo not associated with her migraines.  Will be start vestibular rehab  02/06/22: Tried nurtec daily for the last week but not sure it helped? Maybe knocked the edge off. Otherwise her headaches are controlled. Has a mild headache 1-2 times a week. Usually will try OTC medicine first then will use ubrelvy. Encouraged her to use the ubrelvy first thing.  11/12/20: Patient states that Botox is working well for her.  She states the week before her Botox is due she tends to have more headaches.  She is using the Ubrelvy and sometimes nurtrec. Advised that she could try taking nurtec daily 5 days before her cycle to try to prevent migraines.    BOTOX PROCEDURE NOTE FOR MIGRAINE HEADACHE    Contraindications and precautions discussed with patient(above). Aseptic procedure was observed and patient tolerated procedure. Procedure  performed by Butch Penny, NP  The condition has existed for more than 6 months, and pt does not have a diagnosis of ALS, Myasthenia Gravis or Lambert-Eaton Syndrome.  Risks and benefits of injections discussed and pt agrees to proceed with the procedure.  Written consent obtained  These injections are medically necessary. These injections do not cause sedations or hallucinations which the oral therapies may cause.  Indication/Diagnosis: chronic migraine BOTOX(J0585) injection was performed according to protocol by [employer]. 200 units of BOTOX was dissolved into 4 cc NS.   NDC: [address]-78  Type of toxin: Botox  Botox- 200 units x 1 vial Lot: [id number] Expiration: 01/2025 NDC: 0865-7846-96   Bacteriostatic 0.9% Sodium Chloride- 4 mL  Lot: [id number] Expiration: 12/08/2023 NDC: 1324-4010-27   Dx: O53.664     Description of procedure:  The patient was placed in a sitting position. The standard protocol was used for Botox as follows, with 5 units of Botox injected at each site:   -Procerus muscle, midline injection  -Corrugator muscle, bilateral injection  -Frontalis muscle, bilateral injection, with 2 sites each side, medial injection was performed in the upper one third of the frontalis muscle, in the region vertical from the medial inferior edge of the superior orbital rim. The lateral injection was again in the upper one third of the forehead vertically above the lateral limbus of the cornea, 1.5 cm lateral to the medial injection site.  -Temporalis muscle injection, 4 sites, bilaterally. The first injection  was 3 cm above the tragus of the ear, second injection site was 1.5 cm to 3 cm up from the first injection site in line with the tragus of the ear. The third injection site was 1.5-3 cm forward between the first 2 injection sites. The fourth injection site was 1.5 cm posterior to the second injection site.  -Occipitalis muscle injection, 3 sites, bilaterally. The first  injection was done one half way between the occipital protuberance and the tip of the mastoid process behind the ear. The second injection site was done lateral and superior to the first, 1 fingerbreadth from the first injection. The third injection site was 1 fingerbreadth superiorly and medially from the first injection site.  -Cervical paraspinal muscle injection, 2 sites, bilateral knee first injection site was 1 cm from the midline of the cervical spine, 3 cm inferior to the lower border of the occipital protuberance. The second injection site was 1.5 cm superiorly and laterally to the first injection site.  -Trapezius muscle injection was performed at 3 sites, bilaterally. The first injection site was in the upper trapezius muscle halfway between the inflection point of the neck, and the acromion. The second injection site was one half way between the acromion and the first injection site. The third injection was done between the first injection site and the inflection point of the neck.   Will return for repeat injection in 3 months.   A 200 unitsof Botox was used, 155 units were injected, the rest of the Botox was wasted. The patient tolerated the procedure well, there were no complications of the above procedure.  Butch Penny, MSN, NP-C 02/05/2023, 3:03 PM [facility] [address], [address] [place], [place] [address] [phone]

## 2023-02-05 NOTE — Progress Notes (Signed)
Botox- 200 units x 1 vial Lot: [id number] Expiration: 01/2025 NDC: 4540-9811-91  Bacteriostatic 0.9% Sodium Chloride- 4 mL  Lot: [id number] Expiration: 12/08/2023 NDC: 6213-0865-78  Dx: I69.629 B/B Witnessed by Elana Alm

## 2023-02-25 ENCOUNTER — Other Ambulatory Visit: Payer: Self-pay

## 2023-02-25 MED ORDER — FLUCONAZOLE 150 MG PO TABS
150.0000 mg | ORAL_TABLET | Freq: Once | ORAL | 0 refills | Status: AC
  Filled 2023-02-25: qty 1, 1d supply, fill #0

## 2023-02-26 ENCOUNTER — Other Ambulatory Visit: Payer: Self-pay

## 2023-03-09 ENCOUNTER — Other Ambulatory Visit: Payer: Self-pay

## 2023-03-09 ENCOUNTER — Other Ambulatory Visit: Payer: Self-pay | Admitting: Psychiatry

## 2023-03-09 DIAGNOSIS — F3342 Major depressive disorder, recurrent, in full remission: Secondary | ICD-10-CM

## 2023-03-09 MED ORDER — BUPROPION HCL ER (SR) 150 MG PO TB12
150.0000 mg | ORAL_TABLET | Freq: Two times a day (BID) | ORAL | 0 refills | Status: AC
Start: 2023-03-09 — End: 2023-06-11
  Filled 2023-03-09: qty 180, 90d supply, fill #0

## 2023-03-09 NOTE — Telephone Encounter (Signed)
Tried to leave a message at phone number but mailbox was full. I did send a mychart message to call the office and schedule

## 2023-03-09 NOTE — Telephone Encounter (Signed)
Ordered refill. Could you contact the patient to have a follow up with Dr. Elna Breslow? She is not seen since Jan.

## 2023-03-10 ENCOUNTER — Other Ambulatory Visit (HOSPITAL_COMMUNITY): Payer: Self-pay

## 2023-03-23 ENCOUNTER — Ambulatory Visit (INDEPENDENT_AMBULATORY_CARE_PROVIDER_SITE_OTHER): Payer: Commercial Managed Care - PPO | Admitting: Licensed Clinical Social Worker

## 2023-03-23 DIAGNOSIS — F330 Major depressive disorder, recurrent, mild: Secondary | ICD-10-CM

## 2023-03-23 DIAGNOSIS — F411 Generalized anxiety disorder: Secondary | ICD-10-CM | POA: Diagnosis not present

## 2023-03-23 NOTE — Patient Instructions (Signed)
Outpatient Psychiatry and Counseling  FOR CRISIS:  call 911, [facility]: Mobile Crisis Management 24 hours:  [phone], call [phone], [facility] ([facility]) [address] walk in, or go to your local EMERGENCY DEPARTMENT  [facility] [address], [place], [place] [address]  [phone]  [facility] [address] [place], [place] [address] [phone]  [facility] [address] [place],  [place]  [address] [phone]  [facility] Address: [address] Maurine Cane [place], [place] [address] Phone: [phone]  [facility] [place] and [place] Locations) [email or web address]  [employer] [facility] [employer] fee and walk in schedule: M-F 8am-12pm/1pm-3pm [address]  [place], [place] [address] [phone]  [facility] [address] [place], [place] [address] [phone]  [facility] Services/ Intensive Outpatient Therapy Program/CDIOP/PHP [address] [place], [place] [address] [phone]  [facility], Outpatient Therapy Services, [place]      [phone]     [address]    [place], [place] [address]                 [facility]   [facility] [phone]. [address] [place], [place] [address]  [facility]          [address]  [place], [place] [address]       [phone]  [facility] [address] [place], [place] [address] [phone]  [facility]    [address]    [place], [place] [address]     [phone]       [facility] [address] [place] [address]  [facility]     [address] [place], [place]      [phone]       [facility] Candice Ditch, Candice [name] [address] [place], [place] [address] [phone]  [facility]     [address]     [place], [place] [address]     [phone]       [facility] [address] [place], [place] [address] [phone] Resources for Temporary Residential Assistance/Crisis Centers

## 2023-03-23 NOTE — Progress Notes (Unsigned)
Virtual Visit via Video Note  I connected with [name] on 03/23/23 at  4:00 PM EDT by a video enabled telemedicine application and verified that I am speaking with the correct person using two identifiers.  Patient provides verbal consent for her husband, Marchelle Rinella, to be present throughout session.  Location: Patient: home Provider: remote office [place], [place])   I discussed the limitations of evaluation and management by telemedicine and the availability of in person appointments. The patient expressed understanding and agreed to proceed.  I discussed the assessment and treatment plan with the patient. The patient was provided an opportunity to ask questions and all were answered. The patient agreed with the plan and demonstrated an understanding of the instructions.   The patient was advised to call back or seek an in-person evaluation if the symptoms worsen or if the condition fails to improve as anticipated.  I provided 49 minutes of non-face-to-face time during this encounter.   India Jolin R Gilda Abboud, LCSW   THERAPIST PROGRESS NOTE  Session Time: [phone]  Participation Level: Active  Behavioral Response: Neat and Well GroomedAlertAnxious  Type of Therapy: Family Therapy  Treatment Goals addressed: Decrease depressive symptoms and improve levels of effective functioning-pt reports a decrease in overall depression symptoms 3 out of 5 sessions documented   Develop healthy thinking patterns and beliefs about self, others, and the world that lead to the alleviation and help prevent the relapse of depression per self report 3 out of 5 sessions documented   ProgressTowards Goals: Progressing  Interventions: CBT, Motivational Interviewing, and Supportive  Summary: BESSY REANEY is a 37 y.o. female who presents with continuing symptoms related to depression.  Pt reports that mood has been stable and that she is managing situational stressors well.   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 Going back through that process.   Daughter: doing okay. Hair falling out.  Husband: feel like he's been "off". Transitioning off meds. Stopped abilify. AC not working for a week and got I. Negative self thoughts.  Struggle with communication sexually  Self care: needs to improve  Emotion regulation: needs to work on it  Arguments: "low blows" with one another. He didn't want to engage in therapy  abilify mellowed him out where he just don't care.compared him to his brother.    Continued recommendations are as follows: self care behaviors, positive social engagements, focusing on overall work/home/life balance, and focusing on positive physical and emotional wellness.   Suicidal/Homicidal: No.   Therapist Response: Pt is continuing to apply interventions learned in session into daily life situations. Pt is currently on track to meet goals utilizing interventions mentioned above. Personal growth and progress noted. Treatment to continue as indicated.   Plan: Return again in 4 weeks.  Diagnosis:  No diagnosis found.  Collaboration of Care: Other Pt encouraged to continue psychiatric care with Dr. Elna Breslow  Patient/Guardian was advised Release of Information must be obtained prior to any record release in order to collaborate their care with an outside provider. Patient/Guardian was advised if they have not already done so to contact the registration department to sign all necessary forms in order for [place] to release information regarding their care.   Consent: Patient/Guardian gives verbal consent for treatment and assignment of benefits for services provided during this visit. Patient/Guardian expressed understanding and agreed to proceed.   Ernest Haber Shae Augello, LCSW 03/23/2023

## 2023-04-17 ENCOUNTER — Other Ambulatory Visit: Payer: Self-pay

## 2023-04-17 ENCOUNTER — Telehealth: Payer: Commercial Managed Care - PPO | Admitting: Nurse Practitioner

## 2023-04-17 ENCOUNTER — Encounter: Payer: Self-pay | Admitting: Neurology

## 2023-04-17 DIAGNOSIS — R110 Nausea: Secondary | ICD-10-CM

## 2023-04-17 DIAGNOSIS — G43009 Migraine without aura, not intractable, without status migrainosus: Secondary | ICD-10-CM

## 2023-04-17 MED ORDER — ONDANSETRON 4 MG PO TBDP
4.0000 mg | ORAL_TABLET | Freq: Three times a day (TID) | ORAL | 0 refills | Status: DC | PRN
Start: 2023-04-17 — End: 2023-05-06
  Filled 2023-04-17: qty 20, 7d supply, fill #0

## 2023-04-17 MED ORDER — METHYLPREDNISOLONE 4 MG PO TBPK
ORAL_TABLET | ORAL | 0 refills | Status: DC
Start: 2023-04-17 — End: 2023-07-15
  Filled 2023-04-17: qty 21, 6d supply, fill #0

## 2023-04-17 NOTE — Progress Notes (Signed)
Virtual Visit Consent   Candice [name], you are scheduled for a virtual visit with a [facility] provider today. Just as with appointments in the office, your consent must be obtained to participate. Your consent will be active for this visit and any virtual visit you may have with one of our providers in the next 365 days. If you have a MyChart account, a copy of this consent can be sent to you electronically.  As this is a virtual visit, video technology does not allow for your provider to perform a traditional examination. This may limit your provider's ability to fully assess your condition. If your provider identifies any concerns that need to be evaluated in person or the need to arrange testing (such as labs, EKG, etc.), we will make arrangements to do so. Although advances in technology are sophisticated, we cannot ensure that it will always work on either your end or our end. If the connection with a video visit is poor, the visit may have to be switched to a telephone visit. With either a video or telephone visit, we are not always able to ensure that we have a secure connection.  By engaging in this virtual visit, you consent to the provision of healthcare and authorize for your insurance to be billed (if applicable) for the services provided during this visit. Depending on your insurance coverage, you may receive a charge related to this service.  I need to obtain your verbal consent now. Are you willing to proceed with your visit today? Candice [name] has provided verbal consent on 04/17/2023 for a virtual visit (video or telephone). Candice Simas, FNP  Date: 04/17/2023 9:14 AM  Virtual Visit via Video Note   I, Candice [name], connected with  Candice [name]  ([id number], [birth year]) on 04/17/23 at  9:30 AM EDT by a video-enabled telemedicine application and verified that I am speaking with the correct person using two identifiers.  Location: Patient: Virtual Visit Location Patient:  Home Provider: Virtual Visit Location Provider: Home Office   I discussed the limitations of evaluation and management by telemedicine and the availability of in person appointments. The patient expressed understanding and agreed to proceed.    History of Present Illness: Candice [name] is a 37 y.o. who identifies as a female who was assigned female at birth, and is being seen today for recurrent migraine headaches.  Symptoms today are migraine with vertigo/nausea. Symptoms started 6 days ago and have been worsening over the past 48 hours.   She has used her ibuprofen and Zofran and also her Bernita Raisin without relief  She has had migraine infusions in the office as well but is unable to be seen today   She is due for her Botox 05/06/23 which has been helping her   In the past Medrol dosepaKs have been used for abortive therapy (most recently in April)  She is unable to get through to her neurologist today as their office is closed today   She [facility] under the care [name] specifically  Dr. Naomie [name]    Most recent summary of management from neurology:     02/05/23: Botox works well.  Also taking Qulipta daily.  She has Nurtec and Ubrelvy for abortive therapy.  She is advised not to take these medications together.  She saw Dr. Lucia [name] in April for an office visit      Problems:  Patient Active Problem List   Diagnosis Date Noted   MDD (major  depressive disorder), recurrent episode, mild (HCC) 10/30/2020   Insomnia due to mental condition 07/14/2019   Anxiety 05/13/2019   MDD (major depressive disorder), recurrent, in full remission (HCC) 04/08/2019   Panic attacks 04/08/2019   MDD (major depressive disorder), recurrent, in partial remission (HCC) 04/08/2019   Stress at home 12/19/2016   Muscle spasm 06/06/2016   Migraine without aura and with status migrainosus, not intractable 11/23/2015   Migraine without aura and without status migrainosus, not  intractable 09/11/2015   Chronic migraine without aura without status migrainosus, not intractable 09/11/2015   Tachycardia 08/27/2015   Depression, major, recurrent, moderate (HCC) 02/19/2015   Postpartum care following cesarean delivery (5/29) 02/04/2014   Indication for care in labor or delivery 02/03/2014   Short cervix in second trimester, antepartum 11/16/2013   Antepartum bleeding, second trimester 11/15/2013    Allergies:  Allergies  Allergen Reactions   Elemental Sulfur Hives   Sulfa Antibiotics Other (See Comments) and Hives   Medications:  Current Outpatient Medications:    ALPRAZolam (XANAX) 0.25 MG tablet, Take 1 tablet (0.25 mg total) by mouth at bedtime as needed for anxiety., Disp: 15 tablet, Rfl: 0   Atogepant (QULIPTA) 60 MG TABS, Take 1 tablet (60 mg total) by mouth daily., Disp: 30 tablet, Rfl: 11   buPROPion (WELLBUTRIN SR) 150 MG 12 hr tablet, Take 1 tablet (150 mg total) by mouth 2 (two) times daily. Take daily at 8:30 AM and at 2 PM, Disp: 180 tablet, Rfl: 0   cyanocobalamin (,VITAMIN B-12,) 1000 MCG/ML injection, Inject 1 mL (1,000 mcg total) into the muscle monthly for 4 doses, Disp: 1 mL, Rfl: 3   cyanocobalamin (VITAMIN B12) 1000 MCG/ML injection, Inject 1 mL (1,000 mcg total) into the muscle every 30 (thirty) days., Disp: 1 mL, Rfl: 5   ergocalciferol (VITAMIN D2) 1.25 MG (50000 UT) capsule, Take 1 capsule (50,000 Units total) by mouth once a week., Disp: 12 capsule, Rfl: 1   escitalopram (LEXAPRO) 20 MG tablet, Take 20 mg by mouth daily., Disp: , Rfl:    levonorgestrel-ethinyl estradiol (NORDETTE) 0.15-30 MG-MCG tablet, Take 1 tablet by mouth daily. Take active pills only., Disp: 112 tablet, Rfl: 1   ondansetron (ZOFRAN-ODT) 8 MG disintegrating tablet, Dissolve 1 tablet (8 mg total) in mouth every 8 (eight) hours as needed., Disp: 20 tablet, Rfl: 2   propranolol ER (INDERAL LA) 60 MG 24 hr capsule, Take 1 capsule (60 mg total) by mouth at bedtime., Disp: 30  capsule, Rfl: 1   Rimegepant Sulfate (NURTEC) 75 MG TBDP, Take 1 tablet by mouth daily as needed., Disp: 10 tablet, Rfl: 11   Tuberculin-Allergy Syringes (VANISHPOINT TUBERCULIN SYRINGE) 25G X 1" 1 ML MISC, Use as directed for up to 30 days, Disp: 1 each, Rfl: 3   Tuberculin-Allergy Syringes (VANISHPOINT TUBERCULIN SYRINGE) 25G X 1" 1 ML MISC, Use as directed (for b12 inj) for up to 6 doses, Disp: 1 each, Rfl: 5   Ubrogepant (UBRELVY) 100 MG TABS, Take at the onset of migraine. Can repeat in 2 hours if needed. Only 2 tabs in 24 hours., Disp: 10 tablet, Rfl: 12   valACYclovir (VALTREX) 1000 MG tablet, Take 2 tablets (2,000 mg total) by mouth 2 (two) times daily Take for 2 days and monitor, Disp: 10 tablet, Rfl: 0  Observations/Objective: Patient is well-developed, well-nourished in no acute distress.  Resting comfortably  at home.  Head is normocephalic, atraumatic.  No labored breathing.  Speech is clear and coherent with logical content.  Patient is alert and oriented at baseline.    Assessment and Plan:  1. Migraine without aura and without status migrainosus, not intractable  - methylPREDNISolone (MEDROL DOSEPAK) 4 MG TBPK tablet; Take 6 tablets (24 mg total) by mouth daily for 1 day, THEN 5 tablets (20 mg total) daily for 1 day, THEN 4 tablets (16 mg total) daily for 1 day, THEN 3 tablets (12 mg total) daily for 1 day, THEN 2 tablets (8 mg total) daily for 1 day, THEN 1 tablet (4 mg total) daily for 1 day. Take with food.  Dispense: 21 tablet; Refill: 0 - ondansetron (ZOFRAN-ODT) 4 MG disintegrating tablet; Take 1 tablet (4 mg total) by mouth every 8 (eight) hours as needed.  Dispense: 20 tablet; Refill: 0     Follow Up Instructions: I discussed the assessment and treatment plan with the patient. The patient was provided an opportunity to ask questions and all were answered. The patient agreed with the plan and demonstrated an understanding of the instructions.  A copy of instructions  were sent to the patient via MyChart unless otherwise noted below.    The patient was advised to call back or seek an in-person evaluation if the symptoms worsen or if the condition fails to improve as anticipated.  Time:  I spent 12 minutes with the patient via telehealth technology discussing the above problems/concerns.    Candice Simas, FNP

## 2023-04-17 NOTE — Progress Notes (Signed)
Candice [name],  We do not treat Migraines over E-visits currently. You can cancel this visit and schedule a Video Visit so we can explore your history more and discuss Migraine treatment.     Thank you for the details you included in the comment boxes. Those details are very helpful in determining the best course of treatment for you and help [place] to provide the best care. We recommend that you convert this visit to a video visit in order for the provider to better assess what is going on.  The provider will be able to give you a more accurate diagnosis and treatment plan if we can more freely discuss your symptoms and with the addition of a virtual examination.   If you convert to a video visit, we will bill your insurance (similar to an office visit) and you will not be charged for this e-Visit. You will be able to stay at home and speak with the first available [facility] advanced practice provider. The link to do a video visit is in the drop down Menu tab of your Welcome screen in MyChart.

## 2023-04-20 ENCOUNTER — Other Ambulatory Visit: Payer: Self-pay

## 2023-04-20 DIAGNOSIS — G43009 Migraine without aura, not intractable, without status migrainosus: Secondary | ICD-10-CM

## 2023-04-20 MED ORDER — QULIPTA 60 MG PO TABS
60.0000 mg | ORAL_TABLET | Freq: Every day | ORAL | 11 refills | Status: DC
Start: 2023-04-20 — End: 2024-06-12
  Filled 2023-04-20: qty 30, 30d supply, fill #0
  Filled 2023-06-08: qty 30, 30d supply, fill #1
  Filled 2023-07-22 – 2023-07-28 (×2): qty 30, 30d supply, fill #2
  Filled 2023-10-04: qty 30, 30d supply, fill #3
  Filled 2023-11-07: qty 30, 30d supply, fill #4
  Filled 2023-12-08: qty 30, 30d supply, fill #5
  Filled 2024-01-12: qty 30, 30d supply, fill #6
  Filled 2024-02-23: qty 30, 30d supply, fill #7
  Filled 2024-03-30: qty 30, 30d supply, fill #8

## 2023-04-20 NOTE — Progress Notes (Signed)
Rx sent to [facility] for insurance purposes.

## 2023-04-27 ENCOUNTER — Other Ambulatory Visit: Payer: Self-pay

## 2023-04-28 ENCOUNTER — Other Ambulatory Visit: Payer: Self-pay

## 2023-05-01 ENCOUNTER — Other Ambulatory Visit: Payer: Self-pay

## 2023-05-01 DIAGNOSIS — G43119 Migraine with aura, intractable, without status migrainosus: Secondary | ICD-10-CM | POA: Diagnosis not present

## 2023-05-04 ENCOUNTER — Encounter: Payer: Self-pay | Admitting: Psychiatry

## 2023-05-04 ENCOUNTER — Other Ambulatory Visit: Payer: Self-pay

## 2023-05-04 ENCOUNTER — Telehealth: Payer: Commercial Managed Care - PPO | Admitting: Psychiatry

## 2023-05-04 DIAGNOSIS — F5105 Insomnia due to other mental disorder: Secondary | ICD-10-CM | POA: Diagnosis not present

## 2023-05-04 DIAGNOSIS — F330 Major depressive disorder, recurrent, mild: Secondary | ICD-10-CM

## 2023-05-04 DIAGNOSIS — F418 Other specified anxiety disorders: Secondary | ICD-10-CM | POA: Diagnosis not present

## 2023-05-04 MED ORDER — ESCITALOPRAM OXALATE 5 MG PO TABS
5.0000 mg | ORAL_TABLET | Freq: Every day | ORAL | 0 refills | Status: DC
Start: 2023-05-04 — End: 2023-06-15
  Filled 2023-05-04: qty 15, 15d supply, fill #0

## 2023-05-04 MED ORDER — VENLAFAXINE HCL ER 37.5 MG PO CP24: 37.5000 mg | ORAL_CAPSULE | Freq: Every day | ORAL | Status: DC

## 2023-05-04 NOTE — Patient Instructions (Signed)
Taper off Lexapro, start Lexapro 5 mg p.o. daily for 15 days and stop taking it. Start venlafaxine extended release 37.5 mg p.o. daily.  Venlafaxine Extended-Release Capsules What is this medication? VENLAFAXINE (VEN la fax een) treats depression and anxiety. It increases the amount of serotonin and norepinephrine in the brain, hormones that help regulate mood. It belongs to a group of medications called SNRIs. This medicine may be used for other purposes; ask your health care provider or pharmacist if you have questions. COMMON BRAND NAME(S): Effexor XR What should I tell my care team before I take this medication? They need to know if you have any of these conditions: Bleeding disorders Glaucoma Heart disease High blood pressure High cholesterol Kidney disease Liver disease Low levels of sodium in the blood Mania or bipolar disorder Seizures Suicidal thoughts, plans, or attempt by you or a family member Take medications that treat or prevent blood clots Thyroid disease An unusual or allergic reaction to venlafaxine, other medications, foods, dyes, or preservatives Pregnant or trying to get pregnant Breastfeeding How should I use this medication? Take this medication by mouth with a full glass of water. Take it as directed on the prescription label. Do not cut, crush, or chew this medication. Take it with food. You may open the capsule and put the contents in 1 teaspoon of applesauce. Swallow the medication and applesauce right away. Do not chew the medication or applesauce. Follow with a glass of water to ensure complete swallowing of the pellets. Try to take your medication at about the same time each day. Do not take your medication more often than directed. Keep taking this medication unless your care team tells you to stop. Stopping it too quickly can cause serious side effects. It can also make your condition worse. A special MedGuide will be given to you by the pharmacist with  each prescription and refill. Be sure to read this information carefully each time. Talk to your care team about the use of this medication in children. Special care may be needed. Overdosage: If you think you have taken too much of this medicine contact a poison control center or emergency room at once. NOTE: This medicine is only for you. Do not share this medicine with others. What if I miss a dose? If you miss a dose, take it as soon as you can. If it is almost time for your next dose, take only that dose. Do not take double or extra doses. What may interact with this medication? Do not take this medication with any of the following: Alcohol Certain medications for fungal infections, such as fluconazole, itraconazole, ketoconazole, posaconazole, voriconazole Cisapride Desvenlafaxine Dronedarone Duloxetine Levomilnacipran Linezolid MAOIs, such as Carbex, Eldepryl, Marplan, Nardil, and Parnate Methylene blue (injected into a vein) Milnacipran Pimozide Thioridazine This medication may also interact with the following: Amphetamines Aspirin and aspirin-like medications Certain medications for mental health conditions Certain medications for migraine headaches, such as almotriptan, eletriptan, frovatriptan, naratriptan, rizatriptan, sumatriptan, zolmitriptan Certain medications for sleep Certain medications that treat or prevent blood clots, such as dalteparin, enoxaparin, warfarin Cimetidine Clozapine Diuretics Fentanyl Furazolidone Indinavir Isoniazid Lithium Metoprolol NSAIDS, medications for pain and inflammation, such as ibuprofen or naproxen Other medications that cause heart rhythm changes Procarbazine Rasagiline Supplements, such as St. John's wort, kava kava, valerian Tramadol Tryptophan This list may not describe all possible interactions. Give your health care provider a list of all the medicines, herbs, non-prescription drugs, or dietary supplements you use. Also  tell them if  you smoke, drink alcohol, or use illegal drugs. Some items may interact with your medicine. What should I watch for while using this medication? Tell your care team if your symptoms do not get better or if they get worse. Visit your care team for regular checks on your progress. Because it may take several weeks to see the full effects of this medication, it is important to continue your treatment as prescribed by your care team. Watch for new or worsening thoughts of suicide or depression. This includes sudden changes in mood, behaviors, or thoughts. These changes can happen at any time but are more common in the beginning of treatment or after a change in dose. Call your care team right away if you experience these thoughts or worsening depression. This medication may cause mood and behavior changes, such as anxiety, nervousness, irritability, hostility, restlessness, excitability, hyperactivity, or trouble sleeping. These changes can happen at any time but are more common in the beginning of treatment or after a change in dose. Call your care team right away if you notice any of these symptoms. This medication can cause an increase in blood pressure. Check with your care team for instructions on monitoring your blood pressure while taking this medication. This medication may affect your coordination, reaction time, or judgment. Do not drive or operate machinery until you know how this medication affects you. Sit up or stand slowly to reduce the risk of dizzy or fainting spells. Drinking alcohol with this medication can increase the risk of these side effects. Your mouth may get dry. Chewing sugarless gum or sucking hard candy and drinking plenty of water may help. Contact your care team if the problem does not go away or is severe. What side effects may I notice from receiving this medication? Side effects that you should report to your care team as soon as possible: Allergic  reactions--skin rash, itching, hives, swelling of the face, lips, tongue, or throat Bleeding--bloody or black, tar-like stools, red or dark brown urine, vomiting blood or brown material that looks like coffee grounds, small, red or purple spots on skin, unusual bleeding or bruising Heart rhythm changes--fast or irregular heartbeat, dizziness, feeling faint or lightheaded, chest pain, trouble breathing Increase in blood pressure Loss of appetite with weight loss Low sodium level--muscle weakness, fatigue, dizziness, headache, confusion Serotonin syndrome--irritability, confusion, fast or irregular heartbeat, muscle stiffness, twitching muscles, sweating, high fever, seizures, chills, vomiting, diarrhea Sudden eye pain or change in vision such as blurry vision, seeing halos around lights, vision loss Thoughts of suicide or self-harm, worsening mood, feelings of depression Side effects that usually do not require medical attention (report to your care team if they continue or are bothersome): Anxiety, nervousness Change in sex drive or performance Dizziness Dry mouth Excessive sweating Nausea Tremors or shaking Trouble sleeping This list may not describe all possible side effects. Call your doctor for medical advice about side effects. You may report side effects to FDA at 1-800-FDA-1088. Where should I keep my medication? Keep out of the reach of children and pets. Store at a controlled temperature between 20 and 25 degrees C (68 degrees and 77 degrees F), in a dry place. Throw away any unused medication after the expiration date. NOTE: This sheet is a summary. It may not cover all possible information. If you have questions about this medicine, talk to your doctor, pharmacist, or health care provider.  2024 Elsevier/Gold Standard (2022-08-21 00:00:00)

## 2023-05-04 NOTE — Progress Notes (Signed)
Virtual Visit via Video Note  I connected with Candice [name] on 05/04/23 at  9:30 AM EDT by a video enabled telemedicine application and verified that I am speaking with the correct person using two identifiers.  Location Provider Location : [facility] Patient Location : Work  Participants: Patient , Provider    I discussed the limitations of evaluation and management by telemedicine and the availability of in person appointments. The patient expressed understanding and agreed to proceed.    I discussed the assessment and treatment plan with the patient. The patient was provided an opportunity to ask questions and all were answered. The patient agreed with the plan and demonstrated an understanding of the instructions.   The patient was advised to call back or seek an in-person evaluation if the symptoms worsen or if the condition fails to improve as anticipated.    [facility] MD OP Progress Note  05/04/2023 1:29 PM Candice [name]  MRN:  [id number]  Chief Complaint:  Chief Complaint  Patient presents with   Follow-up   Anxiety   Depression   Medication Refill   HPI: Candice [name] is a 37 year old Caucasian female, employed, lives in [place], has a history of MDD, anxiety disorder, insomnia, bilateral hearing loss, migraine headaches was evaluated by telemedicine today.  Patient today reports she is currently struggling with anxiety symptoms.  She reports she worries a lot, has racing thoughts, comes up with worst case scenarios.  She also has depression symptoms like sadness, concentration problem, low energy and motivation.  She reports she had a lot of situational stressors recently.  Her grandmother passed away, 2 of her dogs passed away and her boss quit recently.  She was compliant with psychotherapy sessions with her previous therapist Ms.Hussami, however reports her therapist is no longer practicing at that office.  She hence has not been able to attend therapy.  However is  motivated to do so and is agreeable to find a new therapist.  Patient reports she continues to take the Lexapro 20 mg daily.  Last visit she was advised to taper off the Lexapro and start venlafaxine.  Patient reports she did not start the venlafaxine since she was worried about the side effects.  She however would like to start it now since she feels ready to do so.  Patient reports sleep is overall okay.  Denies any suicidality, homicidality or perceptual disturbances.  Patient denies any other concerns today.  Visit Diagnosis:    ICD-10-CM   1. MDD (major depressive disorder), recurrent episode, mild (HCC)  F33.0 escitalopram (LEXAPRO) 5 MG tablet    venlafaxine XR (EFFEXOR XR) 37.5 MG 24 hr capsule    2. Other specified anxiety disorders  F41.8 escitalopram (LEXAPRO) 5 MG tablet    venlafaxine XR (EFFEXOR XR) 37.5 MG 24 hr capsule   with limited symptom attacks    3. Insomnia due to mental condition  F51.05    mood,pain      Past Psychiatric History: I have reviewed past psychiatric history from progress note on 01/10/2019.  Past Medical History:  Past Medical History:  Diagnosis Date   Anxiety    Depression    Hearing loss    Migraines    a. x 10+ years.   Postpartum care following cesarean delivery (5/29) 02/04/2014   Pre-syncope    a. 07/2015 [facility] ED visit, ? vasovagal, improved with IVF.   Short cervix in second trimester, antepartum 11/16/2013    Past Surgical History:  Procedure Laterality Date   CESAREAN SECTION N/A 02/03/2014   Procedure: Primary Cesarean Section Delivery Baby Girl @ 2244, Apgars 9/9;  Surgeon: Serita Kyle, MD;  Location: [facility];  Service: Obstetrics;  Laterality: N/A;   CESAREAN SECTION     WISDOM TOOTH EXTRACTION  2002    Family Psychiatric History: I have reviewed family psychiatric history from progress note on 01/10/2019.  Family History:  Family History  Problem Relation Age of Onset   Depression Mother    Anxiety disorder  Mother    Heart attack Mother        NSTEMI @ age 32.   Migraines Mother    Sleep apnea Father        alive @ 15.   Anxiety disorder Maternal Grandmother    Depression Maternal Grandmother    Diabetes Maternal Grandmother    Hypertension Maternal Grandmother    Heart attack Maternal Grandfather    Alcohol abuse Paternal Grandfather    Suicidality Paternal Grandfather    Seizures Daughter     Social History: I have reviewed social history from progress note on 01/10/2019. Social History   Socioeconomic History   Marital status: Married    Spouse name: Not on file   Number of children: 1   Years of education: Not on file   Highest education level: Not on file  Occupational History   Not on file  Tobacco Use   Smoking status: Never   Smokeless tobacco: Never  Vaping Use   Vaping status: Never Used  Substance and Sexual Activity   Alcohol use: Yes    Alcohol/week: 0.0 standard drinks of alcohol    Comment: maybe two drinks/month.   Drug use: No   Sexual activity: Yes    Birth control/protection: Pill  Other Topics Concern   Not on file  Social History Narrative   Lives in [place] with her husband and 1 yr old daughter.  Does not routinely exercise.   Caffeine: 1 cup coffee/day, some soda , 1-2 cups of caffeine/day   Social Determinants of Health   Financial Resource Strain: Low Risk  (07/13/2018)   Overall Financial Resource Strain (CARDIA)    Difficulty of Paying Living Expenses: Not hard at all  Food Insecurity: No Food Insecurity (07/13/2018)   Hunger Vital Sign    Worried About Running Out of Food in the Last Year: Never true    Ran Out of Food in the Last Year: Never true  Transportation Needs: No Transportation Needs (07/13/2018)   PRAPARE - Administrator, Civil Service (Medical): No    Lack of Transportation (Non-Medical): No  Physical Activity: Inactive (07/13/2018)   Exercise Vital Sign    Days of Exercise per Week: 0 days    Minutes of  Exercise per Session: 0 min  Stress: Not on file  Social Connections: Unknown (07/13/2018)   Social Connection and Isolation Panel [NHANES]    Frequency of Communication with Friends and Family: Not on file    Frequency of Social Gatherings with Friends and Family: Not on file    Attends Religious Services: Not on file    Active Member of Clubs or Organizations: Not on file    Attends Banker Meetings: Not on file    Marital Status: Married    Allergies:  Allergies  Allergen Reactions   Elemental Sulfur Hives   Sulfa Antibiotics Other (See Comments) and Hives    Metabolic Disorder Labs: No results found for: "HGBA1C", "  MPG" No results found for: "PROLACTIN" No results found for: "CHOL", "TRIG", "HDL", "CHOLHDL", "VLDL", "LDLCALC" Lab Results  Component Value Date   TSH 0.696 07/12/2015    Therapeutic Level Labs: No results found for: "LITHIUM" No results found for: "VALPROATE" No results found for: "CBMZ"  Current Medications: Current Outpatient Medications  Medication Sig Dispense Refill   ALPRAZolam (XANAX) 0.25 MG tablet Take 1 tablet (0.25 mg total) by mouth at bedtime as needed for anxiety. 15 tablet 0   Atogepant (QULIPTA) 60 MG TABS Take 1 tablet (60 mg total) by mouth daily. 30 tablet 11   buPROPion (WELLBUTRIN SR) 150 MG 12 hr tablet Take 1 tablet (150 mg total) by mouth 2 (two) times daily. Take daily at 8:30 AM and at 2 PM 180 tablet 0   cyanocobalamin (,VITAMIN B-12,) 1000 MCG/ML injection Inject 1 mL (1,000 mcg total) into the muscle monthly for 4 doses 1 mL 3   cyanocobalamin (VITAMIN B12) 1000 MCG/ML injection Inject 1 mL (1,000 mcg total) into the muscle every 30 (thirty) days. 1 mL 5   ergocalciferol (VITAMIN D2) 1.25 MG (50000 UT) capsule Take 1 capsule (50,000 Units total) by mouth once a week. 12 capsule 1   escitalopram (LEXAPRO) 5 MG tablet Take 1 tablet (5 mg total) by mouth daily for 15 days. 15 tablet 0   levonorgestrel-ethinyl estradiol  (NORDETTE) 0.15-30 MG-MCG tablet Take 1 tablet by mouth daily. Take active pills only. 112 tablet 1   ondansetron (ZOFRAN-ODT) 8 MG disintegrating tablet Dissolve 1 tablet (8 mg total) in mouth every 8 (eight) hours as needed. 20 tablet 2   propranolol ER (INDERAL LA) 60 MG 24 hr capsule Take 1 capsule (60 mg total) by mouth at bedtime. 30 capsule 1   Rimegepant Sulfate (NURTEC) 75 MG TBDP Take 1 tablet by mouth daily as needed. 10 tablet 11   Tuberculin-Allergy Syringes (VANISHPOINT TUBERCULIN SYRINGE) 25G X 1" 1 ML MISC Use as directed for up to 30 days 1 each 3   Tuberculin-Allergy Syringes (VANISHPOINT TUBERCULIN SYRINGE) 25G X 1" 1 ML MISC Use as directed (for b12 inj) for up to 6 doses 1 each 5   Ubrogepant (UBRELVY) 100 MG TABS Take at the onset of migraine. Can repeat in 2 hours if needed. Only 2 tabs in 24 hours. 10 tablet 12   valACYclovir (VALTREX) 1000 MG tablet Take 2 tablets (2,000 mg total) by mouth 2 (two) times daily Take for 2 days and monitor 10 tablet 0   venlafaxine XR (EFFEXOR XR) 37.5 MG 24 hr capsule Take 1 capsule (37.5 mg total) by mouth daily with breakfast. Has supplies     methylPREDNISolone (MEDROL DOSEPAK) 4 MG TBPK tablet Take 6 tablets (24 mg total) by mouth daily for 1 day, THEN 5 tablets (20 mg total) daily for 1 day, THEN 4 tablets (16 mg total) daily for 1 day, THEN 3 tablets (12 mg total) daily for 1 day, THEN 2 tablets (8 mg total) daily for 1 day, THEN 1 tablet (4 mg total) daily for 1 day. Take with food. (Patient not taking: Reported on 05/04/2023) 21 tablet 0   ondansetron (ZOFRAN-ODT) 4 MG disintegrating tablet Take 1 tablet (4 mg total) by mouth every 8 (eight) hours as needed. (Patient not taking: Reported on 05/04/2023) 20 tablet 0   No current facility-administered medications for this visit.     Musculoskeletal: Strength & Muscle Tone:  UTA Gait & Station:  Seated Patient leans: N/A  Psychiatric Specialty Exam:  Review of Systems   Psychiatric/Behavioral:  Positive for dysphoric mood. The patient is nervous/anxious.     There were no vitals taken for this visit.There is no height or weight on file to calculate BMI.  General Appearance: Fairly Groomed  Eye Contact:  Fair  Speech:  Clear and Coherent  Volume:  Normal  Mood:  Anxious,sad   Affect:  Appropriate  Thought Process:  Goal Directed and Descriptions of Associations: Intact  Orientation:  Full (Time, Place, and Person)  Thought Content: Logical   Suicidal Thoughts:  No  Homicidal Thoughts:  No  Memory:  Immediate;   Fair Recent;   Fair Remote;   Fair  Judgement:  Fair  Insight:  Fair  Psychomotor Activity:  Normal  Concentration:  Concentration: Fair and Attention Span: Fair  Recall:  [employer] of Knowledge: Fair  Language: Fair  Akathisia:  No  Handed:  Right  AIMS (if indicated): not done  Assets:  Communication Skills Desire for Improvement Social Support Talents/Skills Transportation  ADL's:  Intact  Cognition: WNL  Sleep:  Fair   Screenings: GAD-7    Garment/textile technologist Visit from 09/23/2022 in [facility] Visit from 05/26/2022 in [facility] Visit from 02/19/2022 in [facility]  Total GAD-7 Score 5 4 5       PHQ2-9    Flowsheet Row Counselor from 11/20/2022 in [facility] at [facility] Visit from 09/23/2022 in [facility] Office Visit from 05[facility]26/2022 in [facility] Counselor from 05/13/2022 in [facility] at [facility] Visit from 02/19/2022 in [facility]  PHQ-2 Total Score 1 2 0 1 1  PHQ-9 Total Score 7 5 2  -- --      Flowsheet Row Video Visit from 05/04/2023 in [facility] Counselor from 12/24/2022 in [facility] at [facility] from 11/20/2022 in [facility] Health at [facility] RISK CATEGORY No Risk No Risk No Risk        Assessment and Plan: AAMANDA [name] is a 37 year old Caucasian female, employed, married, has a history of depression, anxiety was evaluated by telemedicine today.  Patient noncompliant with recommendations to taper off Lexapro and start venlafaxine, however reports she would like to consider doing it this visit, does report anxiety symptoms.  Patient also does not have a psychotherapist at this time, she will benefit from continued CBT-plan as noted below.  Plan MDD-unstable Start venlafaxine extended release 37.5 mg p.o. daily with breakfast Start Lexapro 5 mg p.o. daily for 2 weeks and stop taking it. Patient aware of medication side effects including drug to drug interaction with SSRIs, SNRIs, serotonin syndrome. Wellbutrin SR 150 mg p.o. twice daily  Other specified anxiety disorder-unstable Taper of Lexapro as noted above Start venlafaxine extended release 37.5 mg p.o. daily Xanax 0.25 mg as needed for severe anxiety attacks Patient provided resources in the community, Ms. Irving Burton [phone].  Patient to establish care for therapy.  Insomnia-stable Continue sleep hygiene techniques  Follow-up in clinic in 6 to 7 weeks or sooner if needed. Collaboration of Care: Collaboration of Care: Referral or follow-up with counselor/therapist AEB patient encouraged to establish care with therapist.  Patient/Guardian was advised Release of Information must be obtained prior to any record release in order to collaborate their care  with an outside provider. Patient/Guardian was advised if they have not already done so to contact the registration department to sign all necessary forms in order for [place] to release information regarding their care.   Consent:  Patient/Guardian gives verbal consent for treatment and assignment of benefits for services provided during this visit. Patient/Guardian expressed understanding and agreed to proceed.   This note was generated in part or whole with voice recognition software. Voice recognition is usually quite accurate but there are transcription errors that can and very often do occur. I apologize for any typographical errors that were not detected and corrected.    Jomarie Longs, MD 05/04/2023, 1:29 PM

## 2023-05-06 ENCOUNTER — Ambulatory Visit (INDEPENDENT_AMBULATORY_CARE_PROVIDER_SITE_OTHER): Payer: Commercial Managed Care - PPO | Admitting: Adult Health

## 2023-05-06 DIAGNOSIS — G43709 Chronic migraine without aura, not intractable, without status migrainosus: Secondary | ICD-10-CM | POA: Diagnosis not present

## 2023-05-06 DIAGNOSIS — G43009 Migraine without aura, not intractable, without status migrainosus: Secondary | ICD-10-CM

## 2023-05-06 MED ORDER — ONABOTULINUMTOXINA 200 UNITS IJ SOLR
155.0000 [IU] | Freq: Once | INTRAMUSCULAR | Status: AC
Start: 2023-05-06 — End: 2023-05-06
  Administered 2023-05-06: 155 [IU] via INTRAMUSCULAR

## 2023-05-06 NOTE — Progress Notes (Signed)
Botox- 200 units x 1 vial Lot: [id number] Expiration: 08/2025 NDC: 1914-7829-56  Bacteriostatic 0.9% Sodium Chloride- 4 mL  Lot: [id number] Expiration: 06/06/2023 NDC: 5784-6962-95  Dx: G43.009  B/B Witnessed by Lennie Muckle, RN

## 2023-05-06 NOTE — Progress Notes (Signed)
05/06/23: Reports that she did start having breakthrough headaches 2 to 3 weeks before Botox today.  She did try taking Nurtec several days in a row.  She also has Ubrelvy for abortive therapy.  02/05/23: Botox works well.  Also taking Qulipta daily.  She has Nurtec and Ubrelvy for abortive therapy.  She is advised not to take these medications together.  She saw Dr. Lucia Gaskins in April for an office visit  10/30/22: Botox continues to work well.  After the last injection she developed a mild headache with nausea and vomiting.  This lasted about 6 days and then resolved.  She never had the symptoms after any previous Botox.  Advised patient that I did not feel that this was related to her Botox injections.  Also discussed with Dr. Frances Furbish who did not feel this would be related to her Botox injections  08/04/22: Reports that Botox works well except for the last 2 weeks if she starts to have breakthrough headaches.  She states that she typically has 2 mild headaches during the week that she can take Excedrin Migraine.  Has not had any severe headaches.  Tries to take Nurtec as a preventative medicine 2 weeks before her Botox is due.  Will use Bernita Raisin as an abortive medication if needed  05/06/22: Botox is working well.  She is reports that she has been having trouble with vertigo not associated with her migraines.  Will be start vestibular rehab  02/06/22: Tried nurtec daily for the last week but not sure it helped? Maybe knocked the edge off. Otherwise her headaches are controlled. Has a mild headache 1-2 times a week. Usually will try OTC medicine first then will use ubrelvy. Encouraged her to use the ubrelvy first thing.  11/12/20: Patient states that Botox is working well for her.  She states the week before her Botox is due she tends to have more headaches.  She is using the Ubrelvy and sometimes nurtrec. Advised that she could try taking nurtec daily 5 days before her cycle to try to prevent migraines.     BOTOX PROCEDURE NOTE FOR MIGRAINE HEADACHE    Contraindications and precautions discussed with patient(above). Aseptic procedure was observed and patient tolerated procedure. Procedure performed by Butch Penny, NP  The condition has existed for more than 6 months, and pt does not have a diagnosis of ALS, Myasthenia Gravis or Lambert-Eaton Syndrome.  Risks and benefits of injections discussed and pt agrees to proceed with the procedure.  Written consent obtained  These injections are medically necessary. These injections do not cause sedations or hallucinations which the oral therapies may cause.  Indication/Diagnosis: chronic migraine BOTOX(J0585) injection was performed according to protocol by [employer]. 200 units of BOTOX was dissolved into 4 cc NS.   NDC: [address]-86  Type of toxin: Botox  Botox- 200 units x 1 vial Lot: [id number] Expiration: 08/2025 NDC: 5784-6962-95   Bacteriostatic 0.9% Sodium Chloride- 4 mL  Lot: [id number] Expiration: 06/06/2023 NDC: 4401-0272-53   Dx: G64.403       Description of procedure:  The patient was placed in a sitting position. The standard protocol was used for Botox as follows, with 5 units of Botox injected at each site:   -Procerus muscle, midline injection  -Corrugator muscle, bilateral injection  -Frontalis muscle, bilateral injection, with 2 sites each side, medial injection was performed in the upper one third of the frontalis muscle, in the region vertical from the medial inferior edge of the superior orbital rim. The  lateral injection was again in the upper one third of the forehead vertically above the lateral limbus of the cornea, 1.5 cm lateral to the medial injection site.  -Temporalis muscle injection, 4 sites, bilaterally. The first injection was 3 cm above the tragus of the ear, second injection site was 1.5 cm to 3 cm up from the first injection site in line with the tragus of the ear. The third injection site was  1.5-3 cm forward between the first 2 injection sites. The fourth injection site was 1.5 cm posterior to the second injection site.  -Occipitalis muscle injection, 3 sites, bilaterally. The first injection was done one half way between the occipital protuberance and the tip of the mastoid process behind the ear. The second injection site was done lateral and superior to the first, 1 fingerbreadth from the first injection. The third injection site was 1 fingerbreadth superiorly and medially from the first injection site.  -Cervical paraspinal muscle injection, 2 sites, bilateral knee first injection site was 1 cm from the midline of the cervical spine, 3 cm inferior to the lower border of the occipital protuberance. The second injection site was 1.5 cm superiorly and laterally to the first injection site.  -Trapezius muscle injection was performed at 3 sites, bilaterally. The first injection site was in the upper trapezius muscle halfway between the inflection point of the neck, and the acromion. The second injection site was one half way between the acromion and the first injection site. The third injection was done between the first injection site and the inflection point of the neck.   Will return for repeat injection in 3 months.   A 200 unitsof Botox was used, 155 units were injected, the rest of the Botox was wasted. The patient tolerated the procedure well, there were no complications of the above procedure.  Butch Penny, MSN, NP-C 05/06/2023, 3:12 PM [facility] [address] [place], [place] [address] [phone]

## 2023-05-15 ENCOUNTER — Telehealth: Payer: Self-pay

## 2023-05-15 ENCOUNTER — Other Ambulatory Visit (HOSPITAL_COMMUNITY): Payer: Self-pay

## 2023-05-15 NOTE — Telephone Encounter (Signed)
*  [facility]  Pharmacy Patient Advocate Encounter  Received notification from [facility] that Prior Authorization for Ubrelvy 100MG  tablets  has been APPROVED from 05/15/2023 to 05/13/2024. Ran test claim, Copay is $0.00. This test claim was processed through [facility]- copay amounts may vary at other pharmacies due to pharmacy/plan contracts, or as the patient moves through the different stages of their insurance plan.   PA #/Case ID/Reference #: [id number]

## 2023-05-19 ENCOUNTER — Encounter: Payer: Self-pay | Admitting: Neurology

## 2023-05-28 DIAGNOSIS — Z0289 Encounter for other administrative examinations: Secondary | ICD-10-CM

## 2023-06-01 NOTE — Telephone Encounter (Signed)
FMLA form completed, signed, and sent to medical records for processing.  ?

## 2023-06-02 ENCOUNTER — Telehealth: Payer: Self-pay | Admitting: *Deleted

## 2023-06-02 NOTE — Telephone Encounter (Signed)
Pt matrix form faxed on 06/02/2023[=

## 2023-06-08 ENCOUNTER — Other Ambulatory Visit: Payer: Self-pay

## 2023-06-08 ENCOUNTER — Other Ambulatory Visit: Payer: Self-pay | Admitting: Psychiatry

## 2023-06-08 DIAGNOSIS — F3342 Major depressive disorder, recurrent, in full remission: Secondary | ICD-10-CM

## 2023-06-08 MED ORDER — BUPROPION HCL ER (SR) 150 MG PO TB12
150.0000 mg | ORAL_TABLET | Freq: Two times a day (BID) | ORAL | 1 refills | Status: DC
Start: 2023-06-08 — End: 2024-01-13
  Filled 2023-06-08: qty 180, 90d supply, fill #0
  Filled 2023-10-04: qty 180, 90d supply, fill #1

## 2023-06-15 ENCOUNTER — Other Ambulatory Visit: Payer: Self-pay

## 2023-06-15 ENCOUNTER — Encounter: Payer: Self-pay | Admitting: Psychiatry

## 2023-06-15 ENCOUNTER — Telehealth (INDEPENDENT_AMBULATORY_CARE_PROVIDER_SITE_OTHER): Payer: Commercial Managed Care - PPO | Admitting: Psychiatry

## 2023-06-15 DIAGNOSIS — F418 Other specified anxiety disorders: Secondary | ICD-10-CM

## 2023-06-15 DIAGNOSIS — F5105 Insomnia due to other mental disorder: Secondary | ICD-10-CM | POA: Diagnosis not present

## 2023-06-15 DIAGNOSIS — F330 Major depressive disorder, recurrent, mild: Secondary | ICD-10-CM | POA: Diagnosis not present

## 2023-06-15 MED ORDER — VENLAFAXINE HCL ER 75 MG PO CP24
75.0000 mg | ORAL_CAPSULE | Freq: Every day | ORAL | 1 refills | Status: DC
Start: 2023-06-15 — End: 2023-08-21
  Filled 2023-06-15: qty 30, 30d supply, fill #0
  Filled 2023-07-22: qty 30, 30d supply, fill #1

## 2023-06-15 NOTE — Progress Notes (Unsigned)
Virtual Visit via Video Note  I connected with Candice [name] on 06/15/23 at  4:30 PM EDT by a video enabled telemedicine application and verified that I am speaking with the correct person using two identifiers. Location Provider Location : [facility] Patient Location : Work  Participants: Patient , Provider    I discussed the limitations of evaluation and management by telemedicine and the availability of in person appointments. The patient expressed understanding and agreed to proceed.    I discussed the assessment and treatment plan with the patient. The patient was provided an opportunity to ask questions and all were answered. The patient agreed with the plan and demonstrated an understanding of the instructions.   The patient was advised to call back or seek an in-person evaluation if the symptoms worsen or if the condition fails to improve as anticipated.    [facility] MD OP Progress Note  06/16/2023 5:28 PM SATCHA STORLIE  MRN:  [id number]  Chief Complaint:  Chief Complaint  Patient presents with   Follow-up   Anxiety   Depression   Medication Refill   HPI: Candice [name] is a 37 year old Caucasian female, employed, lives in [place], has a history of MDD, anxiety disorder, insomnia, bilateral hearing loss, migraine headaches was evaluated by telemedicine today.  Patient today reports she is currently going through a lot of stressors at work.  There has been a lot of changes at her work and that does make her anxious.  She reports her coworkers are also worried and that does affect her as well.  She reports she however is working through it.  Has not been able to find a therapist, reports she kept procrastinating and it never happened.  Patient reports she is is currently taking the venlafaxine 37.5 mg which initially made her nauseous and likely groggy.  She reports however that went away.  She currently takes it late afternoon.  She has noticed she is sleeping more now, falls  asleep quickly and stays in bed longer.  Unknown if venlafaxine has anything to do with it.  Patient denies any suicidality, homicidality or perceptual disturbances.  Patient denies any other concerns today.  Visit Diagnosis:    ICD-10-CM   1. MDD (major depressive disorder), recurrent episode, mild (HCC)  F33.0 venlafaxine XR (EFFEXOR XR) 75 MG 24 hr capsule    2. Other specified anxiety disorders  F41.8 venlafaxine XR (EFFEXOR XR) 75 MG 24 hr capsule   with limited symptom attacks    3. Insomnia due to mental condition  F51.05    mood,pain      Past Psychiatric History: I have reviewed past psychiatric history from progress note on 01/10/2019.  Past Medical History:  Past Medical History:  Diagnosis Date   Anxiety    Depression    Hearing loss    Migraines    a. x 10+ years.   Postpartum care following cesarean delivery (5/29) 02/04/2014   Pre-syncope    a. 07/2015 [name] ED visit, ? vasovagal, improved with IVF.   Short cervix in second trimester, antepartum 11/16/2013    Past Surgical History:  Procedure Laterality Date   CESAREAN SECTION N/A 02/03/2014   Procedure: Primary Cesarean Section Delivery Baby Girl @ 2244, Apgars 9/9;  Surgeon: Serita Kyle, MD;  Location: [facility];  Service: Obstetrics;  Laterality: N/A;   CESAREAN SECTION     WISDOM TOOTH EXTRACTION  2002    Family Psychiatric History: I have reviewed family psychiatric history from  progress note on 01/10/2019.  Family History:  Family History  Problem Relation Age of Onset   Depression Mother    Anxiety disorder Mother    Heart attack Mother        NSTEMI @ age 86.   Migraines Mother    Sleep apnea Father        alive @ 54.   Anxiety disorder Maternal Grandmother    Depression Maternal Grandmother    Diabetes Maternal Grandmother    Hypertension Maternal Grandmother    Heart attack Maternal Grandfather    Alcohol abuse Paternal Grandfather    Suicidality Paternal Grandfather    Seizures  Daughter     Social History: I have reviewed social history from progress note on 01/10/2019. Social History   Socioeconomic History   Marital status: Married    Spouse name: Not on file   Number of children: 1   Years of education: Not on file   Highest education level: Not on file  Occupational History   Not on file  Tobacco Use   Smoking status: Never   Smokeless tobacco: Never  Vaping Use   Vaping status: Never Used  Substance and Sexual Activity   Alcohol use: Yes    Alcohol/week: 0.0 standard drinks of alcohol    Comment: maybe two drinks/month.   Drug use: No   Sexual activity: Yes    Birth control/protection: Pill  Other Topics Concern   Not on file  Social History Narrative   Lives in [place] with her husband and 47 yr old daughter.  Does not routinely exercise.   Caffeine: 1 cup coffee/day, some soda , 1-2 cups of caffeine/day   Social Determinants of Health   Financial Resource Strain: Low Risk  (07/13/2018)   Overall Financial Resource Strain (CARDIA)    Difficulty of Paying Living Expenses: Not hard at all  Food Insecurity: No Food Insecurity (07/13/2018)   Hunger Vital Sign    Worried About Running Out of Food in the Last Year: Never true    Ran Out of Food in the Last Year: Never true  Transportation Needs: No Transportation Needs (07/13/2018)   PRAPARE - Administrator, Civil Service (Medical): No    Lack of Transportation (Non-Medical): No  Physical Activity: Inactive (07/13/2018)   Exercise Vital Sign    Days of Exercise per Week: 0 days    Minutes of Exercise per Session: 0 min  Stress: Not on file  Social Connections: Unknown (07/13/2018)   Social Connection and Isolation Panel [NHANES]    Frequency of Communication with Friends and Family: Not on file    Frequency of Social Gatherings with Friends and Family: Not on file    Attends Religious Services: Not on file    Active Member of Clubs or Organizations: Not on file    Attends Tax inspector Meetings: Not on file    Marital Status: Married    Allergies:  Allergies  Allergen Reactions   Elemental Sulfur Hives   Sulfa Antibiotics Other (See Comments) and Hives    Metabolic Disorder Labs: No results found for: "HGBA1C", "MPG" No results found for: "PROLACTIN" No results found for: "CHOL", "TRIG", "HDL", "CHOLHDL", "VLDL", "LDLCALC" Lab Results  Component Value Date   TSH 0.696 07/12/2015    Therapeutic Level Labs: No results found for: "LITHIUM" No results found for: "VALPROATE" No results found for: "CBMZ"  Current Medications: Current Outpatient Medications  Medication Sig Dispense Refill   venlafaxine XR [facility]  XR) 75 MG 24 hr capsule Take 1 capsule (75 mg total) by mouth daily. 30 capsule 1   ALPRAZolam (XANAX) 0.25 MG tablet Take 1 tablet (0.25 mg total) by mouth at bedtime as needed for anxiety. 15 tablet 0   Atogepant (QULIPTA) 60 MG TABS Take 1 tablet (60 mg total) by mouth daily. 30 tablet 11   buPROPion (WELLBUTRIN SR) 150 MG 12 hr tablet Take 1 tablet (150 mg total) by mouth 2 (two) times daily. Take daily at 8:30 AM and at 2 PM 180 tablet 1   cyanocobalamin (VITAMIN B12) 1000 MCG/ML injection Inject 1 mL (1,000 mcg total) into the muscle every 30 (thirty) days. 1 mL 5   ergocalciferol (VITAMIN D2) 1.25 MG (50000 UT) capsule Take 1 capsule (50,000 Units total) by mouth once a week. 12 capsule 1   levonorgestrel-ethinyl estradiol (NORDETTE) 0.15-30 MG-MCG tablet Take 1 tablet by mouth daily. Take active pills only. 112 tablet 1   methylPREDNISolone (MEDROL DOSEPAK) 4 MG TBPK tablet Take 6 tablets (24 mg total) by mouth daily for 1 day, THEN 5 tablets (20 mg total) daily for 1 day, THEN 4 tablets (16 mg total) daily for 1 day, THEN 3 tablets (12 mg total) daily for 1 day, THEN 2 tablets (8 mg total) daily for 1 day, THEN 1 tablet (4 mg total) daily for 1 day. Take with food. 21 tablet 0   ondansetron (ZOFRAN-ODT) 8 MG disintegrating tablet  Dissolve 1 tablet (8 mg total) in mouth every 8 (eight) hours as needed. 20 tablet 2   propranolol ER (INDERAL LA) 60 MG 24 hr capsule Take 1 capsule (60 mg total) by mouth at bedtime. 30 capsule 1   Rimegepant Sulfate (NURTEC) 75 MG TBDP Take 1 tablet by mouth daily as needed. 10 tablet 11   Tuberculin-Allergy Syringes (VANISHPOINT TUBERCULIN SYRINGE) 25G X 1" 1 ML MISC Use as directed for up to 30 days 1 each 3   Tuberculin-Allergy Syringes (VANISHPOINT TUBERCULIN SYRINGE) 25G X 1" 1 ML MISC Use as directed (for b12 inj) for up to 6 doses 1 each 5   Ubrogepant (UBRELVY) 100 MG TABS Take at the onset of migraine. Can repeat in 2 hours if needed. Only 2 tabs in 24 hours. 10 tablet 12   valACYclovir (VALTREX) 1000 MG tablet Take 2 tablets (2,000 mg total) by mouth 2 (two) times daily Take for 2 days and monitor 10 tablet 0   No current facility-administered medications for this visit.     Musculoskeletal: Strength & Muscle Tone:  UTA Gait & Station:  Seated Patient leans: N/A  Psychiatric Specialty Exam: Review of Systems  Psychiatric/Behavioral:  The patient is nervous/anxious.     There were no vitals taken for this visit.There is no height or weight on file to calculate BMI.  General Appearance: Fairly Groomed  Eye Contact:  Fair  Speech:  Clear and Coherent  Volume:  Normal  Mood:  Anxious  Affect:  Appropriate  Thought Process:  Goal Directed and Descriptions of Associations: Intact  Orientation:  Full (Time, Place, and Person)  Thought Content: Logical   Suicidal Thoughts:  No  Homicidal Thoughts:  No  Memory:  Immediate;   Fair Recent;   Fair Remote;   Fair  Judgement:  Fair  Insight:  Fair  Psychomotor Activity:  Normal  Concentration:  Concentration: Fair and Attention Span: Fair  Recall:  [employer] of Knowledge: Fair  Language: Fair  Akathisia:  No  Handed:  Right  AIMS (if indicated): not done  Assets:  Communication Skills Desire for  Improvement Housing Social Support  ADL's:  Intact  Cognition: WNL  Sleep:  Fair   Screenings: GAD-7    Garment/textile technologist Visit from 09/23/2022 in [facility] Office Visit from 05/26/2022 in [facility] Office Visit from 02/19/2022 in [facility]  Total GAD-7 Score 5 4 5       PHQ2-9    Flowsheet Row Counselor from 11/20/2022 in [facility] at [facility] Visit from 09/23/2022 in [facility] Office Visit from 05/26/2022 in [facility] Counselor from 05/13/2022 in [facility] at [facility] Visit from 02/19/2022 in [facility]  PHQ-2 Total Score 1 2 0 1 1  PHQ-9 Total Score 7 5 2  -- --      Flowsheet Row Video Visit from 06/15/2023 in [facility] Video Visit from 05/04/2023 in [facility] Counselor from 12/24/2022 in [facility] at [facility] RISK CATEGORY No Risk No Risk No Risk        Assessment and Plan: DANIELA SIEBERS is a 38 year old Caucasian female, employed, married, has a history of depression, anxiety was evaluated by telemedicine today.  Patient is currently struggling with anxiety mostly situational, work-related, will benefit from medication readjustment, psychotherapy sessions, plan as noted below.  Plan MDD-improving Increase venlafaxine extended release to 75 mg p.o. daily.  She currently takes it late afternoon. Discontinue Lexapro, tapered off.  Other specified anxiety disorder-unstable Patient advised to establish care with the therapist, provided information for Ms. Phyllis Ginger at [phone] as well as Ms. Primus Bravo at [phone].   Patient to establish care Venlafaxine extended release increased to 75 mg p.o. daily Xanax 0.25 mg as needed for severe anxiety attacks  Insomnia-stable Continue sleep hygiene techniques.    Collaboration of Care: Collaboration of Care: Referral or follow-up with counselor/therapist AEB patient encouraged to establish care with therapist.  Patient/Guardian was advised Release of Information must be obtained prior to any record release in order to collaborate their care with an outside provider. Patient/Guardian was advised if they have not already done so to contact the registration department to sign all necessary forms in order for [place] to release information regarding their care.   Consent: Patient/Guardian gives verbal consent for treatment and assignment of benefits for services provided during this visit. Patient/Guardian expressed understanding and agreed to proceed.  Follow-up in clinic in 4 to 5 weeks or sooner in person.  This note was generated in part or whole with voice recognition software. Voice recognition is usually quite accurate but there are transcription errors that can and very often do occur. I apologize for any typographical errors that were not detected and corrected.     Jomarie Longs, MD 06/16/2023, 5:28 PM

## 2023-07-02 ENCOUNTER — Other Ambulatory Visit: Payer: Self-pay

## 2023-07-02 ENCOUNTER — Other Ambulatory Visit: Payer: Self-pay | Admitting: Psychiatry

## 2023-07-02 DIAGNOSIS — F418 Other specified anxiety disorders: Secondary | ICD-10-CM

## 2023-07-03 ENCOUNTER — Other Ambulatory Visit: Payer: Self-pay

## 2023-07-03 MED FILL — Alprazolam Tab 0.25 MG: ORAL | 15 days supply | Qty: 15 | Fill #0 | Status: AC

## 2023-07-10 DIAGNOSIS — F411 Generalized anxiety disorder: Secondary | ICD-10-CM | POA: Diagnosis not present

## 2023-07-15 ENCOUNTER — Telehealth: Payer: Commercial Managed Care - PPO | Admitting: Psychiatry

## 2023-07-15 ENCOUNTER — Encounter: Payer: Self-pay | Admitting: Psychiatry

## 2023-07-15 DIAGNOSIS — F5105 Insomnia due to other mental disorder: Secondary | ICD-10-CM | POA: Diagnosis not present

## 2023-07-15 DIAGNOSIS — F418 Other specified anxiety disorders: Secondary | ICD-10-CM | POA: Diagnosis not present

## 2023-07-15 DIAGNOSIS — F330 Major depressive disorder, recurrent, mild: Secondary | ICD-10-CM | POA: Diagnosis not present

## 2023-07-15 NOTE — Progress Notes (Unsigned)
Virtual Visit via Video Note  I connected with Candice [name] on 07/15/23 at  4:30 PM EST by a video enabled telemedicine application and verified that I am speaking with the correct person using two identifiers.  Location Provider Location : [facility] Patient Location : Home  Participants: Patient , Provider    I discussed the limitations of evaluation and management by telemedicine and the availability of in person appointments. The patient expressed understanding and agreed to proceed.   I discussed the assessment and treatment plan with the patient. The patient was provided an opportunity to ask questions and all were answered. The patient agreed with the plan and demonstrated an understanding of the instructions.   The patient was advised to call back or seek an in-person evaluation if the symptoms worsen or if the condition fails to improve as anticipated.   BH MD OP Progress Note  07/15/2023 5:08 PM Candice [name]  MRN:  [id number]  Chief Complaint:  Chief Complaint  Patient presents with   Follow-up   Depression   Anxiety   Medication Refill   HPI: Candice [name] is a 37 year old Caucasian female, employed, lives in [place], has a history of MDD, anxiety disorder, insomnia, bilateral hearing loss, migraine headaches was evaluated by telemedicine today.  Patient today reports she is currently staying home since her daughter got diagnosed with influenza A.  Patient reports her daughter is currently running very high fevers and hence that has been stressful.  She also reports other problems in her family which has been stressful.  Patient reports however she has reached out to her therapist and had couple of sessions.  She currently follows up with Ms. Allayne Butcher.  Therapy sessions are beneficial.  Patient reports she is currently on the higher dosage of venlafaxine.  She reports overall that has helped her to deal with her current stressors.  She did forget to take  it 1 day and noticed withdrawal symptoms.  She hence has been mindful about taking it regularly.  Denies side effects.  Patient reports she does struggle with sleep.  She does have vivid dreams or nightmares with all the current situational stressors.  She is not interested in a prescription sleep medication at this time.  Would like to try melatonin.  Patient denies any suicidality, homicidality or perceptual disturbances.  Patient denies any other concerns today.  Visit Diagnosis:    ICD-10-CM   1. MDD (major depressive disorder), recurrent episode, mild (HCC)  F33.0     2. Other specified anxiety disorders  F41.8    with limited symptom attack    3. Insomnia due to mental condition  F51.05    Mood, pain      Past Psychiatric History: I have reviewed past psychiatric history from progress note on 01/10/2019.  Past Medical History:  Past Medical History:  Diagnosis Date   Anxiety    Depression    Hearing loss    Migraines    a. x 10+ years.   Postpartum care following cesarean delivery (5/29) 02/04/2014   Pre-syncope    a. 07/2015 [facility] ED visit, ? vasovagal, improved with IVF.   Short cervix in second trimester, antepartum 11/16/2013    Past Surgical History:  Procedure Laterality Date   CESAREAN SECTION N/A 02/03/2014   Procedure: Primary Cesarean Section Delivery Baby Girl @ 2244, Apgars 9/9;  Surgeon: Serita Kyle, MD;  Location: [facility];  Service: Obstetrics;  Laterality: N/A;   CESAREAN SECTION  WISDOM TOOTH EXTRACTION  2002    Family Psychiatric History: I have reviewed family psychiatric history from progress note on 01/10/2019.  Family History:  Family History  Problem Relation Age of Onset   Depression Mother    Anxiety disorder Mother    Heart attack Mother        NSTEMI @ age 55.   Migraines Mother    Sleep apnea Father        alive @ 46.   Anxiety disorder Maternal Grandmother    Depression Maternal Grandmother    Diabetes Maternal  Grandmother    Hypertension Maternal Grandmother    Heart attack Maternal Grandfather    Alcohol abuse Paternal Grandfather    Suicidality Paternal Grandfather    Seizures Daughter     Social History: I have reviewed social history from progress note on 01/10/2019. Social History   Socioeconomic History   Marital status: Married    Spouse name: Not on file   Number of children: 1   Years of education: Not on file   Highest education level: Not on file  Occupational History   Not on file  Tobacco Use   Smoking status: Never   Smokeless tobacco: Never  Vaping Use   Vaping status: Never Used  Substance and Sexual Activity   Alcohol use: Yes    Alcohol/week: 0.0 standard drinks of alcohol    Comment: maybe two drinks/month.   Drug use: No   Sexual activity: Yes    Birth control/protection: Pill  Other Topics Concern   Not on file  Social History Narrative   Lives in [place] with her husband and 71 yr old daughter.  Does not routinely exercise.   Caffeine: 1 cup coffee/day, some soda , 1-2 cups of caffeine/day   Social Determinants of Health   Financial Resource Strain: Low Risk  (07/13/2018)   Overall Financial Resource Strain (CARDIA)    Difficulty of Paying Living Expenses: Not hard at all  Food Insecurity: No Food Insecurity (07/13/2018)   Hunger Vital Sign    Worried About Running Out of Food in the Last Year: Never true    Ran Out of Food in the Last Year: Never true  Transportation Needs: No Transportation Needs (07/13/2018)   PRAPARE - Administrator, Civil Service (Medical): No    Lack of Transportation (Non-Medical): No  Physical Activity: Inactive (07/13/2018)   Exercise Vital Sign    Days of Exercise per Week: 0 days    Minutes of Exercise per Session: 0 min  Stress: Not on file  Social Connections: Unknown (07/13/2018)   Social Connection and Isolation Panel [NHANES]    Frequency of Communication with Friends and Family: Not on file    Frequency  of Social Gatherings with Friends and Family: Not on file    Attends Religious Services: Not on file    Active Member of Clubs or Organizations: Not on file    Attends Banker Meetings: Not on file    Marital Status: Married    Allergies:  Allergies  Allergen Reactions   Elemental Sulfur Hives   Sulfa Antibiotics Other (See Comments) and Hives    Metabolic Disorder Labs: No results found for: "HGBA1C", "MPG" No results found for: "PROLACTIN" No results found for: "CHOL", "TRIG", "HDL", "CHOLHDL", "VLDL", "LDLCALC" Lab Results  Component Value Date   TSH 0.696 07/12/2015    Therapeutic Level Labs: No results found for: "LITHIUM" No results found for: "VALPROATE" No results found  for: "CBMZ"  Current Medications: Current Outpatient Medications  Medication Sig Dispense Refill   ALPRAZolam (XANAX) 0.25 MG tablet Take 1 tablet (0.25 mg total) by mouth at bedtime as needed for anxiety. 15 tablet 0   Atogepant (QULIPTA) 60 MG TABS Take 1 tablet (60 mg total) by mouth daily. 30 tablet 11   buPROPion (WELLBUTRIN SR) 150 MG 12 hr tablet Take 1 tablet (150 mg total) by mouth 2 (two) times daily. Take daily at 8:30 AM and at 2 PM 180 tablet 1   cyanocobalamin (VITAMIN B12) 1000 MCG/ML injection Inject 1 mL (1,000 mcg total) into the muscle every 30 (thirty) days. 1 mL 5   ergocalciferol (VITAMIN D2) 1.25 MG (50000 UT) capsule Take 1 capsule (50,000 Units total) by mouth once a week. 12 capsule 1   levonorgestrel-ethinyl estradiol (NORDETTE) 0.15-30 MG-MCG tablet Take 1 tablet by mouth daily. Take active pills only. 112 tablet 1   ondansetron (ZOFRAN-ODT) 8 MG disintegrating tablet Dissolve 1 tablet (8 mg total) in mouth every 8 (eight) hours as needed. 20 tablet 2   propranolol ER (INDERAL LA) 60 MG 24 hr capsule Take 1 capsule (60 mg total) by mouth at bedtime. 30 capsule 1   Rimegepant Sulfate (NURTEC) 75 MG TBDP Take 1 tablet by mouth daily as needed. 10 tablet 11    Tuberculin-Allergy Syringes (VANISHPOINT TUBERCULIN SYRINGE) 25G X 1" 1 ML MISC Use as directed for up to 30 days 1 each 3   Tuberculin-Allergy Syringes (VANISHPOINT TUBERCULIN SYRINGE) 25G X 1" 1 ML MISC Use as directed (for b12 inj) for up to 6 doses 1 each 5   Ubrogepant (UBRELVY) 100 MG TABS Take at the onset of migraine. Can repeat in 2 hours if needed. Only 2 tabs in 24 hours. 10 tablet 12   valACYclovir (VALTREX) 1000 MG tablet Take 2 tablets (2,000 mg total) by mouth 2 (two) times daily Take for 2 days and monitor 10 tablet 0   venlafaxine XR (EFFEXOR XR) 75 MG 24 hr capsule Take 1 capsule (75 mg total) by mouth daily. 30 capsule 1   No current facility-administered medications for this visit.     Musculoskeletal: Strength & Muscle Tone:  UTA Gait & Station:  Seated Patient leans: N/A  Psychiatric Specialty Exam: Review of Systems  Psychiatric/Behavioral:  Positive for sleep disturbance. The patient is nervous/anxious.     There were no vitals taken for this visit.There is no height or weight on file to calculate BMI.  General Appearance: Fairly Groomed  Eye Contact:  Fair  Speech:  Clear and Coherent  Volume:  Normal  Mood:  Anxious  Affect:  Congruent  Thought Process:  Goal Directed and Descriptions of Associations: Intact  Orientation:  Full (Time, Place, and Person)  Thought Content: Logical   Suicidal Thoughts:  No  Homicidal Thoughts:  No  Memory:  Immediate;   Fair Recent;   Fair Remote;   Fair  Judgement:  Fair  Insight:  Fair  Psychomotor Activity:  Normal  Concentration:  Concentration: Fair and Attention Span: Fair  Recall:  [employer] of Knowledge: Fair  Language: Fair  Akathisia:  No  Handed:  Right  AIMS (if indicated): not done  Assets:  Communication Skills Desire for Improvement Housing Intimacy Social Support Talents/Skills  ADL's:  Intact  Cognition: WNL  Sleep:  Poor   Screenings: GAD-7    Loss adjuster, chartered Office Visit from 09/23/2022  in [facility] Office Visit from  05/26/2022 in [facility] Office Visit from 02/19/2022 in [facility]  Total GAD-7 Score 5 4 5       PHQ2-9    Flowsheet Row Counselor from 11/20/2022 in [facility] Visit from 09/23/2022 in [facility] Office Visit from 05/26/2022 in [facility] Counselor from 05/13/2022 in [facility] at [facility] Visit from 02/19/2022 in [facility]  PHQ-2 Total Score 1 2 0 1 1  PHQ-9 Total Score 7 5 2  -- --      Flowsheet Row Video Visit from 07/15/2023 in [facility] Video Visit from 06/15/2023 in [facility] Video Visit from 05/04/2023 in [facility]  C-SSRS RISK CATEGORY No Risk No Risk No Risk        Assessment and Plan: Candice [name] is a 37 year old Caucasian female, employed, married, has a history of depression, anxiety was evaluated by telemedicine today.  Patient is currently struggling with situational stressors although with improvement on the current medication regimen.  Patient also with sleep issues, discussed plan as noted below.  Plan MDD-in partial remission Venlafaxine extended release 75 mg p.o. daily Continue CBT with Ms. Primus Bravo  Other specified anxiety disorder-improving Venlafaxine extended release 75 mg p.o. daily Xanax 0.25 mg as needed for severe anxiety attacks only  Insomnia-unstable Patient is not interested in sleep medication prescribed at this time.  Discussed over-the-counter melatonin,,qunol 5 in 1. Patient to work on sleep hygiene techniques.  Collaboration of Care: Collaboration of  Care: Referral or follow-up with counselor/therapist AEB patient encouraged to continue CBT  Patient/Guardian was advised Release of Information must be obtained prior to any record release in order to collaborate their care with an outside provider. Patient/Guardian was advised if they have not already done so to contact the registration department to sign all necessary forms in order for [place] to release information regarding their care.   Consent: Patient/Guardian gives verbal consent for treatment and assignment of benefits for services provided during this visit. Patient/Guardian expressed understanding and agreed to proceed.  Follow-up in clinic in 2 months or sooner in person.  This note was generated in part or whole with voice recognition software. Voice recognition is usually quite accurate but there are transcription errors that can and very often do occur. I apologize for any typographical errors that were not detected and corrected.     Jomarie Longs, MD 07/16/2023, 8:43 AM

## 2023-07-20 DIAGNOSIS — F411 Generalized anxiety disorder: Secondary | ICD-10-CM | POA: Diagnosis not present

## 2023-07-22 ENCOUNTER — Other Ambulatory Visit: Payer: Self-pay

## 2023-07-22 ENCOUNTER — Encounter: Payer: Self-pay | Admitting: Neurology

## 2023-07-22 MED ORDER — METHYLPREDNISOLONE 4 MG PO TBPK
ORAL_TABLET | ORAL | 0 refills | Status: DC
  Filled 2023-07-22: qty 21, 6d supply, fill #0

## 2023-07-22 NOTE — Addendum Note (Signed)
Addended by: Bertram Savin on: 07/22/2023 02:46 PM   Modules accepted: Orders

## 2023-07-23 ENCOUNTER — Telehealth: Payer: Self-pay

## 2023-07-23 NOTE — Telephone Encounter (Signed)
*  [facility]  Pharmacy Patient Advocate Encounter   Received notification from CoverMyMeds that prior authorization for Qulipta 60MG  tablets  is required/requested.   Insurance verification completed.   The patient is insured through [facility] .   Per test claim: PA required; PA submitted to above mentioned insurance via CoverMyMeds Key/confirmation #/EOC [id number] Status is pending

## 2023-07-28 ENCOUNTER — Other Ambulatory Visit: Payer: Self-pay

## 2023-07-29 ENCOUNTER — Ambulatory Visit: Payer: Commercial Managed Care - PPO | Admitting: Adult Health

## 2023-07-29 DIAGNOSIS — G43709 Chronic migraine without aura, not intractable, without status migrainosus: Secondary | ICD-10-CM | POA: Diagnosis not present

## 2023-07-29 DIAGNOSIS — G43009 Migraine without aura, not intractable, without status migrainosus: Secondary | ICD-10-CM

## 2023-07-29 MED ORDER — ONABOTULINUMTOXINA 200 UNITS IJ SOLR
155.0000 [IU] | Freq: Once | INTRAMUSCULAR | Status: AC
Start: 2023-07-29 — End: 2023-07-29
  Administered 2023-07-29: 155 [IU] via INTRAMUSCULAR

## 2023-07-29 NOTE — Progress Notes (Signed)
Botox- 200 units x 1 vial Lot: [id number] Expiration: 05/2025 NDC: 2725-3664-40  Bacteriostatic 0.9% Sodium Chloride- 4 mL  Lot: [id number] Expiration: 12/2022 NDC: 9563-8756-43  Dx: G43.009  B/B Witnessed by Delmer Islam

## 2023-07-29 NOTE — Progress Notes (Signed)
PATIENT: Candice [name] DOB: [birth year]  REASON FOR VISIT: follow up HISTORY FROM: patient PRIMARY NEUROLOGIST: Dr. Lucia Gaskins   Chief Complaint  Patient presents with   Botulinum Toxin Injection    Pt in 4      HISTORY OF PRESENT ILLNESS: Today 07/29/23  Candice [name] is a 37 y.o. female who has been followed in this office for Migraines . Returns today for botox. Remains on qulipta daily.  Also has propranolol but states that she is not as consistent with taking this.  She also has [place] and states that this works well to treat an acute headache.  She also has Nurtec and will sometimes take it a week or 2 before her Botox is due on a daily basis to prevent migraines.  Her primary care recently put her on Effexor as well.  She returns today for an evaluation.   REVIEW OF SYSTEMS: Out of a complete 14 system review of symptoms, the patient complains only of the following symptoms, and all other reviewed systems are negative.  ALLERGIES: Allergies  Allergen Reactions   Elemental Sulfur Hives   Sulfa Antibiotics Other (See Comments) and Hives    HOME MEDICATIONS: Outpatient Medications Prior to Visit  Medication Sig Dispense Refill   ALPRAZolam (XANAX) 0.25 MG tablet Take 1 tablet (0.25 mg total) by mouth at bedtime as needed for anxiety. 15 tablet 0   Atogepant (QULIPTA) 60 MG TABS Take 1 tablet (60 mg total) by mouth daily. 30 tablet 11   buPROPion (WELLBUTRIN SR) 150 MG 12 hr tablet Take 1 tablet (150 mg total) by mouth 2 (two) times daily. Take daily at 8:30 AM and at 2 PM 180 tablet 1   cyanocobalamin (VITAMIN B12) 1000 MCG/ML injection Inject 1 mL (1,000 mcg total) into the muscle every 30 (thirty) days. 1 mL 5   ergocalciferol (VITAMIN D2) 1.25 MG (50000 UT) capsule Take 1 capsule (50,000 Units total) by mouth once a week. 12 capsule 1   levonorgestrel-ethinyl estradiol (NORDETTE) 0.15-30 MG-MCG tablet Take 1 tablet by mouth daily. Take active pills only. 112 tablet  1   methylPREDNISolone (MEDROL DOSEPAK) 4 MG TBPK tablet Take 6 tablets by mouth once daily with food on Day 1. Then decrease by one tablet daily until complete. (6, 5, 4, 3, 2, 1, then stop). 21 tablet 0   ondansetron (ZOFRAN-ODT) 8 MG disintegrating tablet Dissolve 1 tablet (8 mg total) in mouth every 8 (eight) hours as needed. 20 tablet 2   propranolol ER (INDERAL LA) 60 MG 24 hr capsule Take 1 capsule (60 mg total) by mouth at bedtime. 30 capsule 1   Rimegepant Sulfate (NURTEC) 75 MG TBDP Take 1 tablet by mouth daily as needed. 10 tablet 11   Tuberculin-Allergy Syringes (VANISHPOINT TUBERCULIN SYRINGE) 25G X 1" 1 ML MISC Use as directed for up to 30 days 1 each 3   Tuberculin-Allergy Syringes (VANISHPOINT TUBERCULIN SYRINGE) 25G X 1" 1 ML MISC Use as directed (for b12 inj) for up to 6 doses 1 each 5   Ubrogepant (UBRELVY) 100 MG TABS Take at the onset of migraine. Can repeat in 2 hours if needed. Only 2 tabs in 24 hours. 10 tablet 12   valACYclovir (VALTREX) 1000 MG tablet Take 2 tablets (2,000 mg total) by mouth 2 (two) times daily Take for 2 days and monitor 10 tablet 0   venlafaxine XR (EFFEXOR XR) 75 MG 24 hr capsule Take 1 capsule (75 mg total) by mouth daily.  30 capsule 1   No facility-administered medications prior to visit.    PAST MEDICAL HISTORY: Past Medical History:  Diagnosis Date   Anxiety    Depression    Hearing loss    Migraines    a. x 10+ years.   Postpartum care following cesarean delivery (5/29) 02/04/2014   Pre-syncope    a. 07/2015 [facility] ED visit, ? vasovagal, improved with IVF.   Short cervix in second trimester, antepartum 11/16/2013    PAST SURGICAL HISTORY: Past Surgical History:  Procedure Laterality Date   CESAREAN SECTION N/A 02/03/2014   Procedure: Primary Cesarean Section Delivery Baby Girl @ 2244, Apgars 9/9;  Surgeon: Serita Kyle, MD;  Location: [facility];  Service: Obstetrics;  Laterality: N/A;   CESAREAN SECTION     WISDOM TOOTH EXTRACTION   2002    FAMILY HISTORY: Family History  Problem Relation Age of Onset   Depression Mother    Anxiety disorder Mother    Heart attack Mother        NSTEMI @ age 34.   Migraines Mother    Sleep apnea Father        alive @ 30.   Anxiety disorder Maternal Grandmother    Depression Maternal Grandmother    Diabetes Maternal Grandmother    Hypertension Maternal Grandmother    Heart attack Maternal Grandfather    Alcohol abuse Paternal Grandfather    Suicidality Paternal Grandfather    Seizures Daughter     SOCIAL HISTORY: Social History   Socioeconomic History   Marital status: Married    Spouse name: Not on file   Number of children: 1   Years of education: Not on file   Highest education level: Not on file  Occupational History   Not on file  Tobacco Use   Smoking status: Never   Smokeless tobacco: Never  Vaping Use   Vaping status: Never Used  Substance and Sexual Activity   Alcohol use: Yes    Alcohol/week: 0.0 standard drinks of alcohol    Comment: maybe two drinks/month.   Drug use: No   Sexual activity: Yes    Birth control/protection: Pill  Other Topics Concern   Not on file  Social History Narrative   Lives in [place] with her husband and 70 yr old daughter.  Does not routinely exercise.   Caffeine: 1 cup coffee/day, some soda , 1-2 cups of caffeine/day   Social Determinants of Health   Financial Resource Strain: Low Risk  (07/13/2018)   Overall Financial Resource Strain (CARDIA)    Difficulty of Paying Living Expenses: Not hard at all  Food Insecurity: No Food Insecurity (07/13/2018)   Hunger Vital Sign    Worried About Running Out of Food in the Last Year: Never true    Ran Out of Food in the Last Year: Never true  Transportation Needs: No Transportation Needs (07/13/2018)   PRAPARE - Administrator, Civil Service (Medical): No    Lack of Transportation (Non-Medical): No  Physical Activity: Inactive (07/13/2018)   Exercise Vital Sign     Days of Exercise per Week: 0 days    Minutes of Exercise per Session: 0 min  Stress: Not on file  Social Connections: Unknown (07/13/2018)   Social Connection and Isolation Panel [NHANES]    Frequency of Communication with Friends and Family: Not on file    Frequency of Social Gatherings with Friends and Family: Not on file    Attends Religious Services: Not  on file    Active Member of Clubs or Organizations: Not on file    Attends Banker Meetings: Not on file    Marital Status: Married  Intimate Partner Violence: Not on file      PHYSICAL EXAM    Generalized: Well developed, in no acute distress   Neurological examination  Mentation: Alert oriented to time, place, history taking. Follows all commands speech and language fluent Cranial nerve II-XII: Pupils were equal round reactive to light. Extraocular movements were full, visual field were full on confrontational test. Facial sensation and strength were normal. Motor: The motor testing reveals 5 over 5 strength of all 4 extremities. Good symmetric motor tone is noted throughout.  Sensory: Sensory testing is intact to soft touch on all 4 extremities. No evidence of extinction is noted.  Coordination: Cerebellar testing reveals good finger-nose-finger and heel-to-shin bilaterally.  Gait and station: Gait is normal.  DIAGNOSTIC DATA (LABS, IMAGING, TESTING) - I reviewed patient records, labs, notes, testing and imaging myself where available.  Lab Results  Component Value Date   WBC 8.3 07/12/2015   HGB 14.2 07/12/2015   HCT 43.1 07/12/2015   MCV 88.5 07/12/2015   PLT 244 07/12/2015      Component Value Date/Time   NA 139 07/12/2015 1136   K 3.5 07/12/2015 1136   CL 107 07/12/2015 1136   CO2 27 07/12/2015 1136   GLUCOSE 100 (H) 07/12/2015 1136   BUN 14 07/12/2015 1136   CREATININE 0.79 07/12/2015 1136   CALCIUM 9.0 07/12/2015 1136   GFRNONAA >60 07/12/2015 1136   GFRAA >60 07/12/2015 1136    Lab  Results  Component Value Date   TSH 0.696 07/12/2015      ASSESSMENT AND PLAN 37 y.o. year old female  has a past medical history of Anxiety, Depression, Hearing loss, Migraines, Postpartum care following cesarean delivery (5/29) (02/04/2014), Pre-syncope, and Short cervix in second trimester, antepartum (11/16/2013). here with:  Migraines  - Botox working well - Continue Qulipta 60 mg daily for prevention  - Continue nurtec for abortive therapy  -Continue Ubrelvy for abortive therapy. -Did advise that she should not take Nurtec and Ubrelvy together.  Also advised that she should be mindful not to take Nurtec and Qulipta consecutively for multiple days - FU for next Botox injection      Butch Penny, MSN, NP-C 07/29/2023, 3:39 PM [facility] [address] [place], [place] [address] [phone]

## 2023-07-29 NOTE — Progress Notes (Signed)
07/29/23: Last two weeks have had headaches. Had one headache that lasted 9 days. Got a steriod pack but not sure it has helped. Still doing qulipta daily. In the last 5-7 days doing nurtec daily.   05/06/23: Reports that she did start having breakthrough headaches 2 to 3 weeks before Botox today.  She did try taking Nurtec several days in a row.  She also has Ubrelvy for abortive therapy.  02/05/23: Botox works well.  Also taking Qulipta daily.  She has Nurtec and Ubrelvy for abortive therapy.  She is advised not to take these medications together.  She saw Dr. Lucia Gaskins in April for an office visit  10/30/22: Botox continues to work well.  After the last injection she developed a mild headache with nausea and vomiting.  This lasted about 6 days and then resolved.  She never had the symptoms after any previous Botox.  Advised patient that I did not feel that this was related to her Botox injections.  Also discussed with Dr. Frances Furbish who did not feel this would be related to her Botox injections  08/04/22: Reports that Botox works well except for the last 2 weeks if she starts to have breakthrough headaches.  She states that she typically has 2 mild headaches during the week that she can take Excedrin Migraine.  Has not had any severe headaches.  Tries to take Nurtec as a preventative medicine 2 weeks before her Botox is due.  Will use Bernita Raisin as an abortive medication if needed  05/06/22: Botox is working well.  She is reports that she has been having trouble with vertigo not associated with her migraines.  Will be start vestibular rehab  02/06/22: Tried nurtec daily for the last week but not sure it helped? Maybe knocked the edge off. Otherwise her headaches are controlled. Has a mild headache 1-2 times a week. Usually will try OTC medicine first then will use ubrelvy. Encouraged her to use the ubrelvy first thing.  11/12/20: Patient states that Botox is working well for her.  She states the week before her  Botox is due she tends to have more headaches.  She is using the Ubrelvy and sometimes nurtrec. Advised that she could try taking nurtec daily 5 days before her cycle to try to prevent migraines.    BOTOX PROCEDURE NOTE FOR MIGRAINE HEADACHE    Contraindications and precautions discussed with patient(above). Aseptic procedure was observed and patient tolerated procedure. Procedure performed by Butch Penny, NP  The condition has existed for more than 6 months, and pt does not have a diagnosis of ALS, Myasthenia Gravis or Lambert-Eaton Syndrome.  Risks and benefits of injections discussed and pt agrees to proceed with the procedure.  Written consent obtained  These injections are medically necessary. These injections do not cause sedations or hallucinations which the oral therapies may cause.  Indication/Diagnosis: chronic migraine BOTOX(J0585) injection was performed according to protocol by [employer]. 200 units of BOTOX was dissolved into 4 cc NS.   NDC: [address]-40  Type of toxin: Botox  Botox- 200 units x 1 vial Lot: [id number] Expiration: 05/2025 NDC: 7829-5621-30   Bacteriostatic 0.9% Sodium Chloride- 4 mL  Lot: [id number] Expiration: 12/2022 NDC: 6962-9528-41   Dx: G43.009  B/B       Description of procedure:  The patient was placed in a sitting position. The standard protocol was used for Botox as follows, with 5 units of Botox injected at each site:   -Procerus muscle, midline injection  -Corrugator muscle,  bilateral injection  -Frontalis muscle, bilateral injection, with 2 sites each side, medial injection was performed in the upper one third of the frontalis muscle, in the region vertical from the medial inferior edge of the superior orbital rim. The lateral injection was again in the upper one third of the forehead vertically above the lateral limbus of the cornea, 1.5 cm lateral to the medial injection site.  -Temporalis muscle injection, 4 sites, bilaterally.  The first injection was 3 cm above the tragus of the ear, second injection site was 1.5 cm to 3 cm up from the first injection site in line with the tragus of the ear. The third injection site was 1.5-3 cm forward between the first 2 injection sites. The fourth injection site was 1.5 cm posterior to the second injection site.  -Occipitalis muscle injection, 3 sites, bilaterally. The first injection was done one half way between the occipital protuberance and the tip of the mastoid process behind the ear. The second injection site was done lateral and superior to the first, 1 fingerbreadth from the first injection. The third injection site was 1 fingerbreadth superiorly and medially from the first injection site.  -Cervical paraspinal muscle injection, 2 sites, bilateral knee first injection site was 1 cm from the midline of the cervical spine, 3 cm inferior to the lower border of the occipital protuberance. The second injection site was 1.5 cm superiorly and laterally to the first injection site.  -Trapezius muscle injection was performed at 3 sites, bilaterally. The first injection site was in the upper trapezius muscle halfway between the inflection point of the neck, and the acromion. The second injection site was one half way between the acromion and the first injection site. The third injection was done between the first injection site and the inflection point of the neck.   Will return for repeat injection in 3 months.   A 200 unitsof Botox was used, 155 units were injected, the rest of the Botox was wasted. The patient tolerated the procedure well, there were no complications of the above procedure.  Butch Penny, MSN, NP-C 07/29/2023, 3:31 PM [facility] [address] [place], [place] [address] [phone]

## 2023-07-31 ENCOUNTER — Other Ambulatory Visit (HOSPITAL_COMMUNITY): Payer: Self-pay

## 2023-07-31 NOTE — Telephone Encounter (Signed)
Pharmacy Patient Advocate Encounter  Received notification from [facility] that Prior Authorization for Qulipta 60mg  has been APPROVED from 07/25/2023 to 07/22/2024. Ran test claim, Copay is $refill too soon. This test claim was processed through [facility]- copay amounts may vary at other pharmacies due to pharmacy/plan contracts, or as the patient moves through the different stages of their insurance plan.

## 2023-08-07 DIAGNOSIS — F411 Generalized anxiety disorder: Secondary | ICD-10-CM | POA: Diagnosis not present

## 2023-08-13 ENCOUNTER — Other Ambulatory Visit: Payer: Self-pay

## 2023-08-14 ENCOUNTER — Other Ambulatory Visit: Payer: Self-pay

## 2023-08-20 ENCOUNTER — Other Ambulatory Visit: Payer: Self-pay | Admitting: Psychiatry

## 2023-08-20 ENCOUNTER — Other Ambulatory Visit: Payer: Self-pay

## 2023-08-20 DIAGNOSIS — F418 Other specified anxiety disorders: Secondary | ICD-10-CM

## 2023-08-20 DIAGNOSIS — F330 Major depressive disorder, recurrent, mild: Secondary | ICD-10-CM

## 2023-08-21 ENCOUNTER — Other Ambulatory Visit: Payer: Self-pay

## 2023-08-21 MED FILL — Venlafaxine HCl Cap ER 24HR 75 MG (Base Equivalent): ORAL | 30 days supply | Qty: 30 | Fill #0 | Status: AC

## 2023-08-24 ENCOUNTER — Other Ambulatory Visit: Payer: Self-pay

## 2023-08-24 ENCOUNTER — Telehealth: Payer: Self-pay

## 2023-08-24 ENCOUNTER — Other Ambulatory Visit (HOSPITAL_COMMUNITY): Payer: Self-pay

## 2023-08-24 NOTE — Telephone Encounter (Signed)
Pharmacy Patient Advocate Encounter   Received notification from CoverMyMeds that prior authorization for Nurtec 75MG  dispersible tablets is required/requested.   Insurance verification completed.   The patient is insured through [facility] .   Per test claim: PA required; PA submitted to above mentioned insurance via CoverMyMeds Key/confirmation #/[id number] Status is pending

## 2023-08-25 ENCOUNTER — Other Ambulatory Visit: Payer: Self-pay

## 2023-08-25 ENCOUNTER — Other Ambulatory Visit (HOSPITAL_COMMUNITY): Payer: Self-pay

## 2023-08-26 ENCOUNTER — Other Ambulatory Visit: Payer: Self-pay

## 2023-08-27 ENCOUNTER — Other Ambulatory Visit: Payer: Self-pay

## 2023-08-27 MED ORDER — LEVONORGESTREL-ETHINYL ESTRAD 0.15-30 MG-MCG PO TABS
1.0000 | ORAL_TABLET | Freq: Every day | ORAL | 1 refills | Status: DC
  Filled 2023-08-27: qty 112, 84d supply, fill #0
  Filled 2023-12-08: qty 112, 84d supply, fill #1

## 2023-08-27 MED ORDER — VALACYCLOVIR HCL 1 G PO TABS
2000.0000 mg | ORAL_TABLET | Freq: Two times a day (BID) | ORAL | 0 refills | Status: DC
  Filled 2023-08-27: qty 10, 3d supply, fill #0

## 2023-08-27 NOTE — Telephone Encounter (Signed)
Received a fax requesting additional clinical information-faxed completed form along with clinical to [phone]. Awaiting determination.

## 2023-08-31 ENCOUNTER — Other Ambulatory Visit (HOSPITAL_COMMUNITY): Payer: Self-pay

## 2023-08-31 NOTE — Telephone Encounter (Signed)
Pharmacy Patient Advocate Encounter  Received notification from [facility] that Prior Authorization for Nurtec 75MG  dispersible tablets has been APPROVED from 08/29/2023 to 08/27/2024. Ran test claim, Copay is $0.00. This test claim was processed through [facility]- copay amounts may vary at other pharmacies due to pharmacy/plan contracts, or as the patient moves through the different stages of their insurance plan.   PA #/Case ID/Reference #:  [id number]

## 2023-09-08 ENCOUNTER — Other Ambulatory Visit: Payer: Self-pay

## 2023-09-28 DIAGNOSIS — F411 Generalized anxiety disorder: Secondary | ICD-10-CM | POA: Diagnosis not present

## 2023-09-29 ENCOUNTER — Ambulatory Visit: Payer: Commercial Managed Care - PPO | Admitting: Psychiatry

## 2023-10-02 ENCOUNTER — Telehealth: Payer: Self-pay

## 2023-10-02 NOTE — Telephone Encounter (Signed)
Received a fax stating Botox will be expiring and PT will need a new PA-Faxed to [facility] Attention: Jillian.

## 2023-10-04 ENCOUNTER — Other Ambulatory Visit: Payer: Self-pay

## 2023-10-04 ENCOUNTER — Other Ambulatory Visit: Payer: Self-pay | Admitting: Adult Health

## 2023-10-04 ENCOUNTER — Other Ambulatory Visit: Payer: Self-pay | Admitting: Neurology

## 2023-10-04 MED FILL — Venlafaxine HCl Cap ER 24HR 75 MG (Base Equivalent): ORAL | 30 days supply | Qty: 30 | Fill #1 | Status: AC

## 2023-10-05 ENCOUNTER — Other Ambulatory Visit: Payer: Self-pay

## 2023-10-05 MED ORDER — ONDANSETRON 8 MG PO TBDP
8.0000 mg | ORAL_TABLET | Freq: Three times a day (TID) | ORAL | 2 refills | Status: DC | PRN
  Filled 2023-10-05: qty 20, 7d supply, fill #0
  Filled 2024-02-23: qty 20, 7d supply, fill #1
  Filled 2024-03-30: qty 20, 7d supply, fill #2

## 2023-10-05 MED ORDER — VALACYCLOVIR HCL 1 G PO TABS
2000.0000 mg | ORAL_TABLET | Freq: Two times a day (BID) | ORAL | 0 refills | Status: DC
  Filled 2023-10-05: qty 10, 3d supply, fill #0

## 2023-10-05 MED ORDER — UBRELVY 100 MG PO TABS
ORAL_TABLET | ORAL | 12 refills | Status: DC
  Filled 2023-10-05: qty 10, 5d supply, fill #0
  Filled 2023-11-07: qty 10, 30d supply, fill #0
  Filled 2023-12-08: qty 10, 30d supply, fill #1
  Filled 2024-01-12: qty 10, 30d supply, fill #2
  Filled 2024-02-23: qty 10, 30d supply, fill #3
  Filled 2024-03-30: qty 10, 30d supply, fill #4
  Filled 2024-05-10: qty 10, 30d supply, fill #5
  Filled 2024-06-12: qty 10, 30d supply, fill #6
  Filled 2024-07-14: qty 10, 30d supply, fill #7
  Filled 2024-08-18: qty 10, 30d supply, fill #8

## 2023-10-07 ENCOUNTER — Other Ambulatory Visit: Payer: Self-pay

## 2023-10-07 ENCOUNTER — Telehealth (INDEPENDENT_AMBULATORY_CARE_PROVIDER_SITE_OTHER): Payer: Commercial Managed Care - PPO | Admitting: Psychiatry

## 2023-10-07 ENCOUNTER — Encounter: Payer: Self-pay | Admitting: Psychiatry

## 2023-10-07 DIAGNOSIS — F5105 Insomnia due to other mental disorder: Secondary | ICD-10-CM

## 2023-10-07 DIAGNOSIS — F330 Major depressive disorder, recurrent, mild: Secondary | ICD-10-CM | POA: Diagnosis not present

## 2023-10-07 DIAGNOSIS — F418 Other specified anxiety disorders: Secondary | ICD-10-CM | POA: Diagnosis not present

## 2023-10-07 MED ORDER — VENLAFAXINE HCL ER 37.5 MG PO CP24
37.5000 mg | ORAL_CAPSULE | Freq: Every day | ORAL | 0 refills | Status: DC
  Filled 2023-10-07: qty 90, 90d supply, fill #0

## 2023-10-07 MED ORDER — ALPRAZOLAM 0.25 MG PO TABS
0.2500 mg | ORAL_TABLET | Freq: Every evening | ORAL | 0 refills | Status: DC | PRN
  Filled 2023-10-07: qty 15, 15d supply, fill #0

## 2023-10-07 NOTE — Progress Notes (Unsigned)
Virtual Visit via Video Note  I connected with Candice [name] on 10/07/23 at  4:00 PM EST by a video enabled telemedicine application and verified that I am speaking with the correct person using two identifiers.  Location Provider Location : [facility] Patient Location : Home  Participants: Patient , Provider    I discussed the limitations of evaluation and management by telemedicine and the availability of in person appointments. The patient expressed understanding and agreed to proceed.   I discussed the assessment and treatment plan with the patient. The patient was provided an opportunity to ask questions and all were answered. The patient agreed with the plan and demonstrated an understanding of the instructions.   The patient was advised to call back or seek an in-person evaluation if the symptoms worsen or if the condition fails to improve as anticipated.   [facility] MD OP Progress Note  10/08/2023 9:54 PM Candice [name]  MRN:  [id number]  Chief Complaint:  Chief Complaint  Patient presents with   Anxiety   Depression   Medication Refill   Follow-up   HPI: Candice [name] is a 38 year old Caucasian female, employed, lives in [place], has a history of MDD, anxiety disorder, insomnia, bilateral hearing loss, migraine headache was evaluated by telemedicine today.  The patient presents with worsening anxiety and depressive symptoms exacerbated by personal and professional challenges.  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 Physical manifestations include fever blisters and migraines.  Negative thoughts have been a concern, particularly two weeks ago, when she felt it might be better if she were not present to  deal with her current stressors. She has discussed these feelings with her husband and best friend, who is a Child psychotherapist and part of her support plan. No suicidal plans have been made.  She currently denies any suicidality.  She has a history of depressive symptoms and is currently taking venlafaxine 75 mg and Wellbutrin. She has been compliant with her medication regimen, taking it at night without affecting her sleep, although she has experienced some issues with dreams recently. Xanax is available but has not been used frequently.  To manage stress, she engages in activities such as attending events with friends and family. She wants to avoid filling her time solely to distract from negative thoughts, indicating a need for more sustainable coping strategies. Appetite is okay, and she has been sleeping well despite some issues with dreams. No recent thoughts of self-harm this week.    Visit Diagnosis:    ICD-10-CM   1. MDD (major depressive disorder), recurrent episode, mild (HCC)  F33.0 venlafaxine XR (EFFEXOR-XR) 37.5 MG 24 hr capsule    2. Other specified anxiety disorders  F41.8 venlafaxine XR (EFFEXOR-XR) 37.5 MG 24 hr capsule    ALPRAZolam (XANAX) 0.25 MG tablet   with limited symptom attacks    3. Insomnia due to mental condition  F51.05    mood, pain      Past Psychiatric History: I have reviewed past psychiatric history from progress note on 01/10/2019.  Past Medical History:  Past Medical History:  Diagnosis Date   Anxiety    Depression    Hearing loss    Migraines    a. x 10+ years.   Postpartum care following cesarean delivery (  5/29) 02/04/2014   Pre-syncope    a. 07/2015 [facility] ED visit, ? vasovagal, improved with IVF.   Short cervix in second trimester, antepartum 11/16/2013    Past Surgical History:  Procedure Laterality Date   CESAREAN SECTION N/A 02/03/2014   Procedure: Primary Cesarean Section Delivery Baby Girl @ 2244, Apgars 9/9;  Surgeon: Serita Kyle, MD;  Location: [facility];  Service: Obstetrics;  Laterality: N/A;   CESAREAN SECTION     WISDOM TOOTH EXTRACTION  2002    Family Psychiatric History: I have reviewed family psychiatric history from progress note on 01/10/2019.  Family History:  Family History  Problem Relation Age of Onset   Depression Mother    Anxiety disorder Mother    Heart attack Mother        NSTEMI @ age 44.   Migraines Mother    Sleep apnea Father        alive @ 11.   Anxiety disorder Maternal Grandmother    Depression Maternal Grandmother    Diabetes Maternal Grandmother    Hypertension Maternal Grandmother    Heart attack Maternal Grandfather    Alcohol abuse Paternal Grandfather    Suicidality Paternal Grandfather    Seizures Daughter     Social History: I have reviewed social history from progress note on 01/10/2019. Social History   Socioeconomic History   Marital status: Married    Spouse name: Not on file   Number of children: 1   Years of education: Not on file   Highest education level: Not on file  Occupational History   Not on file  Tobacco Use   Smoking status: Never   Smokeless tobacco: Never  Vaping Use   Vaping status: Never Used  Substance and Sexual Activity   Alcohol use: Yes    Alcohol/week: 0.0 standard drinks of alcohol    Comment: maybe two drinks/month.   Drug use: No   Sexual activity: Yes    Birth control/protection: Pill  Other Topics Concern   Not on file  Social History Narrative   Lives in [place] with her husband and 65 yr old daughter.  Does not routinely exercise.   Caffeine: 1 cup coffee/day, some soda , 1-2 cups of caffeine/day   Social Drivers of Health   Financial Resource Strain: Low Risk  (07/13/2018)   Overall Financial Resource Strain (CARDIA)    Difficulty of Paying Living Expenses: Not hard at all  Food Insecurity: No Food Insecurity (07/13/2018)   Hunger Vital Sign    Worried About Running Out of Food in the Last Year: Never true    Ran  Out of Food in the Last Year: Never true  Transportation Needs: No Transportation Needs (07/13/2018)   PRAPARE - Administrator, Civil Service (Medical): No    Lack of Transportation (Non-Medical): No  Physical Activity: Inactive (07/13/2018)   Exercise Vital Sign    Days of Exercise per Week: 0 days    Minutes of Exercise per Session: 0 min  Stress: Not on file  Social Connections: Unknown (07/13/2018)   Social Connection and Isolation Panel [NHANES]    Frequency of Communication with Friends and Family: Not on file    Frequency of Social Gatherings with Friends and Family: Not on file    Attends Religious Services: Not on file    Active Member of Clubs or Organizations: Not on file    Attends Banker Meetings: Not on file    Marital Status:  Married    Allergies:  Allergies  Allergen Reactions   Elemental Sulfur Hives   Sulfa Antibiotics Other (See Comments) and Hives    Metabolic Disorder Labs: No results found for: "HGBA1C", "MPG" No results found for: "PROLACTIN" No results found for: "CHOL", "TRIG", "HDL", "CHOLHDL", "VLDL", "LDLCALC" Lab Results  Component Value Date   TSH 0.696 07/12/2015    Therapeutic Level Labs: No results found for: "LITHIUM" No results found for: "VALPROATE" No results found for: "CBMZ"  Current Medications: Current Outpatient Medications  Medication Sig Dispense Refill   venlafaxine XR (EFFEXOR-XR) 37.5 MG 24 hr capsule Take 1 capsule (37.5 mg total) by mouth daily with breakfast. Take daily along with 75 mg daily , total of 112.5 mg daily 90 capsule 0   ALPRAZolam (XANAX) 0.25 MG tablet Take 1 tablet (0.25 mg total) by mouth at bedtime as needed for anxiety. 15 tablet 0   Atogepant (QULIPTA) 60 MG TABS Take 1 tablet (60 mg total) by mouth daily. 30 tablet 11   buPROPion (WELLBUTRIN SR) 150 MG 12 hr tablet Take 1 tablet (150 mg total) by mouth 2 (two) times daily. Take daily at 8:30 AM and at 2 PM 180 tablet 1    cyanocobalamin (VITAMIN B12) 1000 MCG/ML injection Inject 1 mL (1,000 mcg total) into the muscle every 30 (thirty) days. 1 mL 5   ergocalciferol (VITAMIN D2) 1.25 MG (50000 UT) capsule Take 1 capsule (50,000 Units total) by mouth once a week. 12 capsule 1   levonorgestrel-ethinyl estradiol (NORDETTE) 0.15-30 MG-MCG tablet Take 1 tablet by mouth daily. Take active pills only. 112 tablet 1   levonorgestrel-ethinyl estradiol (NORDETTE) 0.15-30 MG-MCG tablet Take 1 tablet by mouth daily. Take active pills only. 112 tablet 1   methylPREDNISolone (MEDROL DOSEPAK) 4 MG TBPK tablet Take 6 tablets by mouth once daily with food on Day 1. Then decrease by one tablet daily until complete. (6, 5, 4, 3, 2, 1, then stop). 21 tablet 0   ondansetron (ZOFRAN-ODT) 8 MG disintegrating tablet Dissolve 1 tablet (8 mg total) in mouth every 8 (eight) hours as needed. 20 tablet 2   propranolol ER (INDERAL LA) 60 MG 24 hr capsule Take 1 capsule (60 mg total) by mouth at bedtime. 30 capsule 1   Rimegepant Sulfate (NURTEC) 75 MG TBDP Take 1 tablet by mouth daily as needed. 10 tablet 11   Tuberculin-Allergy Syringes (VANISHPOINT TUBERCULIN SYRINGE) 25G X 1" 1 ML MISC Use as directed for up to 30 days 1 each 3   Tuberculin-Allergy Syringes (VANISHPOINT TUBERCULIN SYRINGE) 25G X 1" 1 ML MISC Use as directed (for b12 inj) for up to 6 doses 1 each 5   Ubrogepant (UBRELVY) 100 MG TABS Take at the onset of migraine. Can repeat in 2 hours if needed. Only 2 tabs in 24 hours. 10 tablet 12   valACYclovir (VALTREX) 1000 MG tablet Take 2 tablets (2,000 mg total) by mouth 2 (two) times daily Take for 2 days and monitor 10 tablet 0   valACYclovir (VALTREX) 1000 MG tablet Take 2 tablets (2,000 mg total) by mouth 2 (two) times daily. Take for 2 days and monitor 10 tablet 0   venlafaxine XR (EFFEXOR-XR) 75 MG 24 hr capsule Take 1 capsule (75 mg total) by mouth daily. 30 capsule 1   No current facility-administered medications for this visit.      Musculoskeletal: Strength & Muscle Tone:  UTA Gait & Station: normal Patient leans: N/A  Psychiatric Specialty Exam: Review of Systems  Psychiatric/Behavioral:  Positive for dysphoric mood. The patient is nervous/anxious.     There were no vitals taken for this visit.There is no height or weight on file to calculate BMI.  General Appearance: Fairly Groomed  Eye Contact:  Fair  Speech:  Clear and Coherent  Volume:  Normal  Mood:  Anxious and Depressed  Affect:  Congruent  Thought Process:  Goal Directed and Descriptions of Associations: Intact  Orientation:  Full (Time, Place, and Person)  Thought Content: Logical   Suicidal Thoughts:  No  Homicidal Thoughts:  No  Memory:  Immediate;   Fair Recent;   Fair Remote;   Fair  Judgement:  Fair  Insight:  Fair  Psychomotor Activity:  Normal  Concentration:  Concentration: Fair and Attention Span: Fair  Recall:  [employer] of Knowledge: Fair  Language: Fair  Akathisia:  No  Handed:  Right  AIMS (if indicated): not done  Assets:  Communication Skills Desire for Improvement Housing Resilience Social Support Talents/Skills  ADL's:  Intact  Cognition: WNL  Sleep:  Fair   Screenings: GAD-7    Garment/textile technologist Visit from 09/23/2022 in [facility] Office Visit from 05/26/2022 in [facility] Office Visit from 02/19/2022 in [facility]  Total GAD-7 Score 5 4 5       PHQ2-9    Flowsheet Row Counselor from 11/20/2022 in [facility] Visit from 09/23/2022 in [facility] Office Visit from 05/26/2022 in [facility] Counselor from 05/13/2022 in [facility] at [facility] Visit from 02/19/2022 in [facility]   PHQ-2 Total Score 1 2 0 1 1  PHQ-9 Total Score 7 5 2  -- --      Flowsheet Row Video Visit from 10/07/2023 in [facility] Video Visit from 07/15/2023 in [facility] Video Visit from 06/15/2023 in [facility]  C-SSRS RISK CATEGORY Low Risk No Risk No Risk        Assessment and Plan: Candice [name] is a 38 year old Caucasian female, employed, married, has a history of depression, anxiety presented for follow-up appointment.  Discussed assessment and plan as noted below.  Major Depressive Disorder-unstable Reports significant stressors including work-related issues, family health concerns, and personal health challenges. Experiencing increased depressive symptoms, including negative thoughts and feelings of being better off not here. Currently on venlafaxine 75 mg and Wellbutrin, with some improvement but still significant stress and depressive symptoms. Compliant with medication but taking it at night. Experienced a fever blister and a migraine attributed to stress. Discussed increasing venlafaxine to 112.5 mg daily. Informed about potential side effects of increased dosage, including nausea, dizziness, and insomnia. Discussed the importance of using coping strategies and distraction techniques.  - Increase venlafaxine to 112.5 mg daily by adding 37.5 mg to the current 75 mg dose.   - Continue Wellbutrin 150 mg twice daily as prescribed.   - Continue Xanax 0.25 mg at bedtime as needed for severe anxiety. - Encouraged to use coping strategies and distraction techniques.   - Provided information on the 988 crisis hotline and local crisis centers.   - Scheduled follow-up appointment for February 24 at 4 PM.    Other specified anxiety disorder-unstable Experiencing significant situational stress due to multiple factors including work-related issues, family health  concerns, and  personal health challenges. Reports feeling overwhelmed and having difficulty managing emotions. Using distraction techniques and engaging in activities to manage stress but still feeling the impact on mental health. Discussed the possibility of an intensive outpatient program if stress and depressive symptoms worsen. Encouraged to maintain open communication with her support system, including her best friend and husband.   - Continue seeing therapist Florentina Addison Bounds every two weeks.   - Consider intensive outpatient program if anxiety and depressive symptoms worsen.  - Continue venlafaxine extended release, dose increased as noted above. - Encouraged to maintain open communication with her support system.    Insomnia-stable Patient currently reports sleep is overall good. -Continue sleep hygiene techniques.  Crisis plan discussed with patient. Encouraged to reach out to her best friend and husband if experiencing suicidal thoughts. Provided information on the 988 crisis hotline and local crisis centers.     Follow-up   - Follow-up appointment scheduled for February 24 at 4 PM.   Collaboration of Care: Collaboration of Care: Referral or follow-up with counselor/therapist AEB patient encouraged to continue CBT.  Patient/Guardian was advised Release of Information must be obtained prior to any record release in order to collaborate their care with an outside provider. Patient/Guardian was advised if they have not already done so to contact the registration department to sign all necessary forms in order for [place] to release information regarding their care.   Consent: Patient/Guardian gives verbal consent for treatment and assignment of benefits for services provided during this visit. Patient/Guardian expressed understanding and agreed to proceed.   This note was generated in part or whole with voice recognition software. Voice recognition is usually quite accurate but there are transcription errors  that can and very often do occur. I apologize for any typographical errors that were not detected and corrected.    Jomarie Longs, MD 10/08/2023, 9:54 PM

## 2023-10-07 NOTE — Telephone Encounter (Signed)
Submitted auth request via [employer], status is pending. Reference #: [id number]

## 2023-10-12 NOTE — Telephone Encounter (Signed)
Received approval, pt will continue to be buy/bill.  auth#: [id number] (10/07/23-10/05/24)

## 2023-10-16 DIAGNOSIS — F411 Generalized anxiety disorder: Secondary | ICD-10-CM | POA: Diagnosis not present

## 2023-10-26 DIAGNOSIS — F411 Generalized anxiety disorder: Secondary | ICD-10-CM | POA: Diagnosis not present

## 2023-10-27 ENCOUNTER — Encounter: Payer: Self-pay | Admitting: Adult Health

## 2023-10-27 ENCOUNTER — Ambulatory Visit (INDEPENDENT_AMBULATORY_CARE_PROVIDER_SITE_OTHER): Payer: Commercial Managed Care - PPO | Admitting: Adult Health

## 2023-10-27 DIAGNOSIS — G43709 Chronic migraine without aura, not intractable, without status migrainosus: Secondary | ICD-10-CM

## 2023-10-27 MED ORDER — ONABOTULINUMTOXINA 200 UNITS IJ SOLR
155.0000 [IU] | Freq: Once | INTRAMUSCULAR | Status: AC
  Administered 2023-10-27: 155 [IU] via INTRAMUSCULAR

## 2023-10-27 NOTE — Progress Notes (Signed)
10/27/23: Reports that she usually gets several days of breakthrough headaches before Botox.  Only had 2 days of headaches before her Botox this time. Has not been taking qulipta  regularly. Nurtec works well to get rd of headaces.   07/29/23: Last two weeks have had headaches. Had one headache that lasted 9 days. Got a steriod pack but not sure it has helped. Still doing qulipta daily. In the last 5-7 days doing nurtec daily.   05/06/23: Reports that she did start having breakthrough headaches 2 to 3 weeks before Botox today.  She did try taking Nurtec several days in a row.  She also has Ubrelvy for abortive therapy.  02/05/23: Botox works well.  Also taking Qulipta daily.  She has Nurtec and Ubrelvy for abortive therapy.  She is advised not to take these medications together.  She saw Dr. Lucia Gaskins in April for an office visit  10/30/22: Botox continues to work well.  After the last injection she developed a mild headache with nausea and vomiting.  This lasted about 6 days and then resolved.  She never had the symptoms after any previous Botox.  Advised patient that I did not feel that this was related to her Botox injections.  Also discussed with Dr. Frances Furbish who did not feel this would be related to her Botox injections  08/04/22: Reports that Botox works well except for the last 2 weeks if she starts to have breakthrough headaches.  She states that she typically has 2 mild headaches during the week that she can take Excedrin Migraine.  Has not had any severe headaches.  Tries to take Nurtec as a preventative medicine 2 weeks before her Botox is due.  Will use Bernita Raisin as an abortive medication if needed  05/06/22: Botox is working well.  She is reports that she has been having trouble with vertigo not associated with her migraines.  Will be start vestibular rehab  02/06/22: Tried nurtec daily for the last week but not sure it helped? Maybe knocked the edge off. Otherwise her headaches are controlled. Has a  mild headache 1-2 times a week. Usually will try OTC medicine first then will use ubrelvy. Encouraged her to use the ubrelvy first thing.  11/12/20: Patient states that Botox is working well for her.  She states the week before her Botox is due she tends to have more headaches.  She is using the Ubrelvy and sometimes nurtrec. Advised that she could try taking nurtec daily 5 days before her cycle to try to prevent migraines.    BOTOX PROCEDURE NOTE FOR MIGRAINE HEADACHE    Contraindications and precautions discussed with patient(above). Aseptic procedure was observed and patient tolerated procedure. Procedure performed by Butch Penny, NP  The condition has existed for more than 6 months, and pt does not have a diagnosis of ALS, Myasthenia Gravis or Lambert-Eaton Syndrome.  Risks and benefits of injections discussed and pt agrees to proceed with the procedure.  Written consent obtained  These injections are medically necessary. These injections do not cause sedations or hallucinations which the oral therapies may cause.  Indication/Diagnosis: chronic migraine BOTOX(J0585) injection was performed according to protocol by [employer]. 200 units of BOTOX was dissolved into 4 cc NS.   NDC: [address]-95  Type of toxin: Botox  Botox- 200 units x 1 vial Lot: [id number] Expiration: 11/2025 NDC: 1884-1660-63   Bacteriostatic 0.9% Sodium Chloride- 4 mL  Lot: [id number] Expiration: 07/09/2024 NDC: 9323-5573-22   Dx: G25.427  Description of procedure:  The patient was placed in a sitting position. The standard protocol was used for Botox as follows, with 5 units of Botox injected at each site:   -Procerus muscle, midline injection  -Corrugator muscle, bilateral injection  -Frontalis muscle, bilateral injection, with 2 sites each side, medial injection was performed in the upper one third of the frontalis muscle, in the region vertical from the medial inferior edge of the  superior orbital rim. The lateral injection was again in the upper one third of the forehead vertically above the lateral limbus of the cornea, 1.5 cm lateral to the medial injection site.  -Temporalis muscle injection, 4 sites, bilaterally. The first injection was 3 cm above the tragus of the ear, second injection site was 1.5 cm to 3 cm up from the first injection site in line with the tragus of the ear. The third injection site was 1.5-3 cm forward between the first 2 injection sites. The fourth injection site was 1.5 cm posterior to the second injection site.  -Occipitalis muscle injection, 3 sites, bilaterally. The first injection was done one half way between the occipital protuberance and the tip of the mastoid process behind the ear. The second injection site was done lateral and superior to the first, 1 fingerbreadth from the first injection. The third injection site was 1 fingerbreadth superiorly and medially from the first injection site.  -Cervical paraspinal muscle injection, 2 sites, bilateral knee first injection site was 1 cm from the midline of the cervical spine, 3 cm inferior to the lower border of the occipital protuberance. The second injection site was 1.5 cm superiorly and laterally to the first injection site.  -Trapezius muscle injection was performed at 3 sites, bilaterally. The first injection site was in the upper trapezius muscle halfway between the inflection point of the neck, and the acromion. The second injection site was one half way between the acromion and the first injection site. The third injection was done between the first injection site and the inflection point of the neck.   Will return for repeat injection in 3 months.   A 200 unitsof Botox was used, 155 units were injected, the rest of the Botox was wasted. The patient tolerated the procedure well, there were no complications of the above procedure.  Butch Penny, MSN, NP-C 10/27/2023, 2:12 PM [facility] [address] [place], [place] [address] [phone]

## 2023-10-27 NOTE — Progress Notes (Signed)
Botox- 200 units x 1 vial Lot: [id number] Expiration: 11/2025 NDC: 0347-4259-56   Bacteriostatic 0.9% Sodium Chloride- 4 mL  Lot: [id number] Expiration: 07/09/2024 NDC: 3329-5188-41   Dx: Y60.630  B/B Witnessed by Leeann Must RN

## 2023-11-02 ENCOUNTER — Encounter: Payer: Self-pay | Admitting: Psychiatry

## 2023-11-02 ENCOUNTER — Other Ambulatory Visit: Payer: Self-pay

## 2023-11-02 ENCOUNTER — Ambulatory Visit (INDEPENDENT_AMBULATORY_CARE_PROVIDER_SITE_OTHER): Payer: Commercial Managed Care - PPO | Admitting: Psychiatry

## 2023-11-02 VITALS — BP 137/90 | HR 108 | Temp 98.1°F | Ht 64.0 in | Wt 176.2 lb

## 2023-11-02 DIAGNOSIS — F5105 Insomnia due to other mental disorder: Secondary | ICD-10-CM | POA: Diagnosis not present

## 2023-11-02 DIAGNOSIS — F330 Major depressive disorder, recurrent, mild: Secondary | ICD-10-CM | POA: Diagnosis not present

## 2023-11-02 DIAGNOSIS — F418 Other specified anxiety disorders: Secondary | ICD-10-CM | POA: Diagnosis not present

## 2023-11-02 MED ORDER — VENLAFAXINE HCL ER 75 MG PO CP24
75.0000 mg | ORAL_CAPSULE | Freq: Every day | ORAL | 1 refills | Status: DC
  Filled 2023-11-02: qty 90, 90d supply, fill #0
  Filled 2024-02-23: qty 90, 90d supply, fill #1

## 2023-11-02 NOTE — Progress Notes (Unsigned)
 [facility] MD OP Progress Note  11/02/2023 5:24 PM Candice [name]  MRN:  [id number]  Chief Complaint:  Chief Complaint  Patient presents with   Follow-up   Depression   Anxiety   Medication Refill   HPI: Candice [name] is a 38 year old Caucasian female, employed, lives in [place], has a history of MDD, anxiety disorder, insomnia, bilateral hearing loss, migraine headaches was evaluated in office today.  The patient is experiencing ongoing stress related to her work environment, which is high-pressure with insufficient staff and reliance on temporary workers. A recent dispute hearing related to a compliance issue at work was particularly stressful. She has been using Xanax occasionally, about two to three times since it was prescribed, to manage acute anxiety, particularly before stressful events like the dispute meeting.  She has a history of depression and anxiety, for which she is taking venlafaxine and Wellbutrin. The dose of venlafaxine was recently increased, but she only started the higher dose last week and has not yet noticed any changes in her symptoms.  Denies current thoughts of self-harm or suicidal ideation are reported. She has been in contact with a therapist  Candice [name], who has offered support .  Her daughter has a history of tonic-clonic seizures and is on multiple medications, including Depakote, Lamictal, and Zonegran. There are concerns about daughters medication side effects, including hair loss and stomach upset, which may be contributing to non-compliance. She is seeking a therapist for her daughter to address some behavioral issues as well.  She also reports a history of headaches for which she recently received a Botox injection. No current headache symptoms were reported during this visit.    Visit Diagnosis:    ICD-10-CM   1. MDD (major depressive disorder), recurrent episode, mild (HCC)  F33.0     2. Other specified anxiety disorders  F41.8 venlafaxine XR  (EFFEXOR-XR) 75 MG 24 hr capsule   with limited symptom attacks    3. Insomnia due to mental condition  F51.05    Mood, pain      Past Psychiatric History: I have reviewed past psychiatric history from progress note on 01/10/2019.  Past Medical History:  Past Medical History:  Diagnosis Date   Anxiety    Depression    Hearing loss    Migraines    a. x 10+ years.   Postpartum care following cesarean delivery (5/29) 02/04/2014   Pre-syncope    a. 07/2015 [facility] ED visit, ? vasovagal, improved with IVF.   Short cervix in second trimester, antepartum 11/16/2013    Past Surgical History:  Procedure Laterality Date   CESAREAN SECTION N/A 02/03/2014   Procedure: Primary Cesarean Section Delivery Baby Girl @ 2244, Apgars 9/9;  Surgeon: Serita Kyle, MD;  Location: [facility];  Service: Obstetrics;  Laterality: N/A;   CESAREAN SECTION     WISDOM TOOTH EXTRACTION  2002    Family Psychiatric History: I have reviewed family psychiatric history from progress note on 01/10/2019.  Family History:  Family History  Problem Relation Age of Onset   Depression Mother    Anxiety disorder Mother    Heart attack Mother        NSTEMI @ age 42.   Migraines Mother    Sleep apnea Father        alive @ 47.   Anxiety disorder Maternal Grandmother    Depression Maternal Grandmother    Diabetes Maternal Grandmother    Hypertension Maternal Grandmother    Heart attack Maternal  Grandfather    Alcohol abuse Paternal Grandfather    Suicidality Paternal Grandfather    Seizures Daughter     Social History: I have reviewed social history from progress note on 01/10/2019. Social History   Socioeconomic History   Marital status: Married    Spouse name: Not on file   Number of children: 1   Years of education: Not on file   Highest education level: Not on file  Occupational History   Not on file  Tobacco Use   Smoking status: Never   Smokeless tobacco: Never  Vaping Use   Vaping status: Never  Used  Substance and Sexual Activity   Alcohol use: Yes    Alcohol/week: 0.0 standard drinks of alcohol    Comment: maybe two drinks/month.   Drug use: No   Sexual activity: Yes    Birth control/protection: Pill  Other Topics Concern   Not on file  Social History Narrative   Lives in [place] with her husband and 88 yr old daughter.  Does not routinely exercise.   Caffeine: 1 cup coffee/day, some soda , 1-2 cups of caffeine/day   Social Drivers of Health   Financial Resource Strain: Low Risk  (07/13/2018)   Overall Financial Resource Strain (CARDIA)    Difficulty of Paying Living Expenses: Not hard at all  Food Insecurity: No Food Insecurity (07/13/2018)   Hunger Vital Sign    Worried About Running Out of Food in the Last Year: Never true    Ran Out of Food in the Last Year: Never true  Transportation Needs: No Transportation Needs (07/13/2018)   PRAPARE - Administrator, Civil Service (Medical): No    Lack of Transportation (Non-Medical): No  Physical Activity: Inactive (07/13/2018)   Exercise Vital Sign    Days of Exercise per Week: 0 days    Minutes of Exercise per Session: 0 min  Stress: Not on file  Social Connections: Unknown (07/13/2018)   Social Connection and Isolation Panel [NHANES]    Frequency of Communication with Friends and Family: Not on file    Frequency of Social Gatherings with Friends and Family: Not on file    Attends Religious Services: Not on file    Active Member of Clubs or Organizations: Not on file    Attends Banker Meetings: Not on file    Marital Status: Married    Allergies:  Allergies  Allergen Reactions   Elemental Sulfur Hives   Sulfa Antibiotics Other (See Comments) and Hives    Metabolic Disorder Labs: No results found for: "HGBA1C", "MPG" No results found for: "PROLACTIN" No results found for: "CHOL", "TRIG", "HDL", "CHOLHDL", "VLDL", "LDLCALC" Lab Results  Component Value Date   TSH 0.696 07/12/2015     Therapeutic Level Labs: No results found for: "LITHIUM" No results found for: "VALPROATE" No results found for: "CBMZ"  Current Medications: Current Outpatient Medications  Medication Sig Dispense Refill   ALPRAZolam (XANAX) 0.25 MG tablet Take 1 tablet (0.25 mg total) by mouth at bedtime as needed for anxiety. 15 tablet 0   Atogepant (QULIPTA) 60 MG TABS Take 1 tablet (60 mg total) by mouth daily. 30 tablet 11   buPROPion (WELLBUTRIN SR) 150 MG 12 hr tablet Take 1 tablet (150 mg total) by mouth 2 (two) times daily. Take daily at 8:30 AM and at 2 PM 180 tablet 1   cyanocobalamin (VITAMIN B12) 1000 MCG/ML injection Inject 1 mL (1,000 mcg total) into the muscle every 30 (thirty) days.  1 mL 5   ergocalciferol (VITAMIN D2) 1.25 MG (50000 UT) capsule Take 1 capsule (50,000 Units total) by mouth once a week. 12 capsule 1   levonorgestrel-ethinyl estradiol (NORDETTE) 0.15-30 MG-MCG tablet Take 1 tablet by mouth daily. Take active pills only. 112 tablet 1   ondansetron (ZOFRAN-ODT) 8 MG disintegrating tablet Dissolve 1 tablet (8 mg total) in mouth every 8 (eight) hours as needed. 20 tablet 2   propranolol ER (INDERAL LA) 60 MG 24 hr capsule Take 1 capsule (60 mg total) by mouth at bedtime. 30 capsule 1   Rimegepant Sulfate (NURTEC) 75 MG TBDP Take 1 tablet by mouth daily as needed. 10 tablet 11   Tuberculin-Allergy Syringes (VANISHPOINT TUBERCULIN SYRINGE) 25G X 1" 1 ML MISC Use as directed (for b12 inj) for up to 6 doses     Ubrogepant (UBRELVY) 100 MG TABS Take at the onset of migraine. Can repeat in 2 hours if needed. Only 2 tabs in 24 hours. 10 tablet 12   valACYclovir (VALTREX) 1000 MG tablet Take 2 tablets (2,000 mg total) by mouth 2 (two) times daily. Take for 2 days and monitor 10 tablet 0   venlafaxine XR (EFFEXOR-XR) 37.5 MG 24 hr capsule Take 1 capsule (37.5 mg total) by mouth daily with breakfast. Take daily along with 75 mg daily , total of 112.5 mg daily 90 capsule 0   venlafaxine XR  (EFFEXOR-XR) 75 MG 24 hr capsule Take 1 capsule (75 mg total) by mouth daily. Take along with 37.5 mg daily 90 capsule 1   No current facility-administered medications for this visit.     Musculoskeletal: Strength & Muscle Tone: within normal limits Gait & Station: normal Patient leans: N/A  Psychiatric Specialty Exam: Review of Systems  Psychiatric/Behavioral:  Positive for dysphoric mood. The patient is nervous/anxious.     Blood pressure (!) 137/90, pulse (!) 108, temperature 98.1 F (36.7 C), temperature source Skin, height 5\' 4"  (1.626 m), weight 176 lb 3.2 oz (79.9 kg).Body mass index is 30.24 kg/m.  General Appearance: Fairly Groomed  Eye Contact:  Fair  Speech:  Clear and Coherent  Volume:  Normal  Mood:  Anxious and Depressed improving  Affect:  Congruent  Thought Process:   UsualGoal Directed and Descriptions of Associations: Intact  Orientation:  Full (Time, Place, and Person)  Thought Content: Logical   Suicidal Thoughts:  No  Homicidal Thoughts:  No  Memory:  Immediate;   Fair Recent;   Fair Remote;   Fair  Judgement:  Fair  Insight:  Fair  Psychomotor Activity:  Normal  Concentration:  Concentration: Fair and Attention Span: Fair  Recall:  [employer] of Knowledge: Fair  Language: Fair  Akathisia:  No  Handed:  Right  AIMS (if indicated): not done  Assets:  Desire for Improvement Housing Social Support  ADL's:  Intact  Cognition: WNL  Sleep:  Fair   Screenings: GAD-7    Garment/textile technologist Visit from 11/02/2023 in [facility] Office Visit from 09/23/2022 in [facility] Office Visit from 05/26/2022 in [facility] Office Visit from 02/19/2022 in [facility]  Total GAD-7 Score 12 5 4 5       PHQ2-9    Flowsheet Row Office Visit from 11/02/2023 in [facility] Counselor from 11/20/2022 in [facility] Visit from 09/23/2022 in [facility] Office Visit from 05/26/2022 in [facility] Counselor from 05/13/2022 in [facility] Total Score 2 1 2  0 1  PHQ-9 Total Score 9 7 5 2  --      Flowsheet Row Office Visit from 11/02/2023 in [facility] Video Visit from 10/07/2023 in [facility] Video Visit from 07/15/2023 in [facility]  C-SSRS RISK CATEGORY No Risk Low Risk No Risk        Assessment and Plan: MARYANA PITTMON is a 38 year old Caucasian female, employed, married, has a history of depression, anxiety, presented for a follow-up appointment.  Patient currently on a higher dosage of venlafaxine, continues to have situational stressors contributing to current mood symptoms although with improvement, discussed assessment and plan as noted below.   Major Depressive Disorder-some improvement Reports fewer stressors this past week but continues to experience significant work-related stress. Recently increased venlafaxine dose; too early to assess efficacy. No current suicidal ideation or thoughts of self-harm. Continues to find therapy sessions with Candice [name] helpful. - Recheck blood pressure due to potential venlafaxine-induced hypertension - Continue Venlafaxine 112.5 mg daily - Continue Wellbutrin 150 mg twice daily. - Follow up in two months via video  Generalized Anxiety Disorder-improving Uses Xanax sparingly, with only 2-3 uses since the last prescription. Used Xanax before a recent dispute meeting due to significant anxiety. - Continue Xanax as needed for acute anxiety - Continue Venlafaxine, recently dosage increase. - Continue psychotherapy  sessions with Ms. Primus Bravo.  Insomnia-stable Currently denies any significant problems. - Continue sleep hygiene techniques.   Elevated blood pressure reading and tachycardia in session today. Elevated blood pressure noted today, potentially due to rushing to the appointment. Venlafaxine can increase blood pressure, necessitating monitoring. - Recheck blood pressure today - Monitor blood pressure at home and report if it remains elevated  Follow-up - Schedule follow-up appointment in two months via video.   Collaboration of Care: Collaboration of Care: Referral or follow-up with counselor/therapist AEB patient encouraged to continue CBT  Patient/Guardian was advised Release of Information must be obtained prior to any record release in order to collaborate their care with an outside provider. Patient/Guardian was advised if they have not already done so to contact the registration department to sign all necessary forms in order for [place] to release information regarding their care.   Consent: Patient/Guardian gives verbal consent for treatment and assignment of benefits for services provided during this visit. Patient/Guardian expressed understanding and agreed to proceed.   This note was generated in part or whole with voice recognition software. Voice recognition is usually quite accurate but there are transcription errors that can and very often do occur. I apologize for any typographical errors that were not detected and corrected.    Jomarie Longs, MD 11/02/2023, 5:24 PM

## 2023-11-08 ENCOUNTER — Other Ambulatory Visit: Payer: Self-pay

## 2023-11-13 DIAGNOSIS — F411 Generalized anxiety disorder: Secondary | ICD-10-CM | POA: Diagnosis not present

## 2023-11-26 ENCOUNTER — Other Ambulatory Visit: Payer: Self-pay

## 2023-11-27 ENCOUNTER — Other Ambulatory Visit: Payer: Self-pay

## 2023-11-27 MED ORDER — VALACYCLOVIR HCL 1 G PO TABS
2000.0000 mg | ORAL_TABLET | Freq: Two times a day (BID) | ORAL | 0 refills | Status: DC
Start: 2023-11-27 — End: 2024-02-10
  Filled 2023-11-27: qty 10, 3d supply, fill #0

## 2023-12-09 ENCOUNTER — Other Ambulatory Visit: Payer: Self-pay

## 2023-12-11 DIAGNOSIS — F411 Generalized anxiety disorder: Secondary | ICD-10-CM | POA: Diagnosis not present

## 2023-12-21 DIAGNOSIS — F411 Generalized anxiety disorder: Secondary | ICD-10-CM | POA: Diagnosis not present

## 2024-01-07 ENCOUNTER — Encounter: Payer: Self-pay | Admitting: Psychiatry

## 2024-01-07 ENCOUNTER — Other Ambulatory Visit: Payer: Self-pay

## 2024-01-07 ENCOUNTER — Telehealth: Payer: Commercial Managed Care - PPO | Admitting: Psychiatry

## 2024-01-07 DIAGNOSIS — F418 Other specified anxiety disorders: Secondary | ICD-10-CM

## 2024-01-07 DIAGNOSIS — F330 Major depressive disorder, recurrent, mild: Secondary | ICD-10-CM | POA: Diagnosis not present

## 2024-01-07 DIAGNOSIS — F5105 Insomnia due to other mental disorder: Secondary | ICD-10-CM | POA: Diagnosis not present

## 2024-01-07 MED ORDER — VENLAFAXINE HCL ER 37.5 MG PO CP24
37.5000 mg | ORAL_CAPSULE | Freq: Every day | ORAL | 1 refills | Status: DC
  Filled 2024-01-07: qty 90, 90d supply, fill #0
  Filled 2024-04-14: qty 90, 90d supply, fill #1

## 2024-01-07 NOTE — Progress Notes (Signed)
 Virtual Visit via Video Note  I connected with Candice [name] on 01/07/24 at  4:30 PM EDT by a video enabled telemedicine application and verified that I am speaking with the correct person using two identifiers.  Location Provider Location : [facility] Patient Location : Car  Participants: Patient , Provider    I discussed the limitations of evaluation and management by telemedicine and the availability of in person appointments. The patient expressed understanding and agreed to proceed.   I discussed the assessment and treatment plan with the patient. The patient was provided an opportunity to ask questions and all were answered. The patient agreed with the plan and demonstrated an understanding of the instructions.   The patient was advised to call back or seek an in-person evaluation if the symptoms worsen or if the condition fails to improve as anticipated.   BH MD OP Progress Note  01/07/2024 5:42 PM Candice [name]  MRN:  [id number]  Chief Complaint:  Chief Complaint  Patient presents with   Follow-up   Depression   Anxiety   Insomnia   Medication Refill   Discussed the use of AI scribe software for clinical note transcription with the patient, who gave verbal consent to proceed.  History of Present Illness Candice [name] is a 38 year old Caucasian female, employed, lives in [place], has a history of MDD, anxiety disorder, insomnia, bilateral hearing loss, migraine headaches was evaluated by telemedicine today.  She experiences ongoing mood concerns, including episodes of anxiety and irritability. Her best friend has noted that she seems more 'down' in recent months, although she does not feel significantly different. Anxiety is intermittent, and she maintains communication with her therapist, Alston Jerry.  She is currently prescribed venlafaxine  (Effexor ) and Wellbutrin  for her mood. She takes Effexor  daily but inconsistently takes the prescribed 150 mg twice daily dose of  Wellbutrin , often missing the second dose. Her morning dose is taken around 7:30 to 8:00 AM.  She has noticed weight gain, attributing it to dietary changes, including the reintroduction of wine, bread, and pasta. Previously, she was on a calorie-restricted program, which she stopped last year. She has not weighed herself recently but suspects she is back to her highest weight of 170 pounds. She wants to manage her weight without buying larger clothes.  She reports low energy levels, which she and her husband have discussed. She completed a course of intramuscular B12 injections last year after low levels were detected in January 2024. She currently takes an oral B12 supplement.   She experiences some nighttime awakenings but can return to sleep easily.  Denies suicidal thoughts or intentions, homicidality or perceptual disturbances.  Denies any other concerns today.    Visit Diagnosis:    ICD-10-CM   1. MDD (major depressive disorder), recurrent episode, mild (HCC)  F33.0 venlafaxine  XR (EFFEXOR -XR) 37.5 MG 24 hr capsule    2. Other specified anxiety disorders  F41.8 venlafaxine  XR (EFFEXOR -XR) 37.5 MG 24 hr capsule   with limited symptom attacks    3. Insomnia due to mental condition  F51.05    mood, pain      Past Psychiatric History: I have reviewed past psychiatric history from progress note on 01/10/2019.  Past Medical History:  Past Medical History:  Diagnosis Date   Anxiety    Depression    Hearing loss    Migraines    a. x 10+ years.   Postpartum care following cesarean delivery (5/29) 02/04/2014   Pre-syncope  a. 07/2015 [facility] ED visit, ? vasovagal, improved with IVF.   Short cervix in second trimester, antepartum 11/16/2013    Past Surgical History:  Procedure Laterality Date   CESAREAN SECTION N/A 02/03/2014   Procedure: Primary Cesarean Section Delivery Baby Girl @ 2244, Apgars 9/9;  Surgeon: Kandra Orn, MD;  Location: [facility];  Service: Obstetrics;   Laterality: N/A;   CESAREAN SECTION     WISDOM TOOTH EXTRACTION  2002    Family Psychiatric History: I have reviewed family psychiatric history from progress note on 01/10/2019.  Family History:  Family History  Problem Relation Age of Onset   Depression Mother    Anxiety disorder Mother    Heart attack Mother        NSTEMI @ age 38.   Migraines Mother    Sleep apnea Father        alive @ 54.   Anxiety disorder Maternal Grandmother    Depression Maternal Grandmother    Diabetes Maternal Grandmother    Hypertension Maternal Grandmother    Heart attack Maternal Grandfather    Alcohol abuse Paternal Grandfather    Suicidality Paternal Grandfather    Seizures Daughter     Social History: I have reviewed social history from progress note on 01/10/2019. Social History   Socioeconomic History   Marital status: Married    Spouse name: Not on file   Number of children: 1   Years of education: Not on file   Highest education level: Not on file  Occupational History   Not on file  Tobacco Use   Smoking status: Never   Smokeless tobacco: Never  Vaping Use   Vaping status: Never Used  Substance and Sexual Activity   Alcohol use: Yes    Alcohol/week: 0.0 standard drinks of alcohol    Comment: maybe two drinks/month.   Drug use: No   Sexual activity: Yes    Birth control/protection: Pill  Other Topics Concern   Not on file  Social History Narrative   Lives in [place] with her husband and 15 yr old daughter.  Does not routinely exercise.   Caffeine : 1 cup coffee/day, some soda , 1-2 cups of caffeine /day   Social Drivers of Health   Financial Resource Strain: Low [name]  (07/13/2018)   Overall Financial Resource Strain (CARDIA)    Difficulty of Paying Living Expenses: Not hard at all  Food Insecurity: No Food Insecurity (07/13/2018)   Hunger Vital Sign    Worried About Running Out of Food in the Last Year: Never true    Ran Out of Food in the Last Year: Never true   Transportation Needs: No Transportation Needs (07/13/2018)   PRAPARE - Administrator, Civil Service (Medical): No    Lack of Transportation (Non-Medical): No  Physical Activity: Inactive (07/13/2018)   Exercise Vital Sign    Days of Exercise per Week: 0 days    Minutes of Exercise per Session: 0 min  Stress: Not on file  Social Connections: Unknown (07/13/2018)   Social Connection and Isolation Panel [NHANES]    Frequency of Communication with Friends and Family: Not on file    Frequency of Social Gatherings with Friends and Family: Not on file    Attends Religious Services: Not on file    Active Member of Clubs or Organizations: Not on file    Attends Banker Meetings: Not on file    Marital Status: Married    Allergies:  Allergies  Allergen Reactions   Elemental Sulfur Hives   Sulfa Antibiotics Other (See Comments) and Hives    Metabolic Disorder Labs: No results found for: "HGBA1C", "MPG" No results found for: "PROLACTIN" No results found for: "CHOL", "TRIG", "HDL", "CHOLHDL", "VLDL", "LDLCALC" Lab Results  Component Value Date   TSH 0.696 07/12/2015    Therapeutic Level Labs: No results found for: "LITHIUM" No results found for: "VALPROATE" No results found for: "CBMZ"  Current Medications: Current Outpatient Medications  Medication Sig Dispense Refill   ALPRAZolam  (XANAX ) 0.25 MG tablet Take 1 tablet (0.25 mg total) by mouth at bedtime as needed for anxiety. 15 tablet 0   Atogepant  (QULIPTA ) 60 MG TABS Take 1 tablet (60 mg total) by mouth daily. 30 tablet 11   buPROPion  (WELLBUTRIN  SR) 150 MG 12 hr tablet Take 1 tablet (150 mg total) by mouth 2 (two) times daily. Take daily at 8:30 AM and at 2 PM 180 tablet 1   cyanocobalamin  (VITAMIN B12) 1000 MCG/ML injection Inject 1 mL (1,000 mcg total) into the muscle every 30 (thirty) days. 1 mL 5   ergocalciferol  (VITAMIN D2) 1.25 MG (50000 UT) capsule Take 1 capsule (50,000 Units total) by mouth once  a week. 12 capsule 1   levonorgestrel -ethinyl estradiol  (NORDETTE) 0.15-30 MG-MCG tablet Take 1 tablet by mouth daily. Take active pills only. 112 tablet 1   ondansetron  (ZOFRAN -ODT) 8 MG disintegrating tablet Dissolve 1 tablet (8 mg total) in mouth every 8 (eight) hours as needed. 20 tablet 2   propranolol  ER (INDERAL  LA) 60 MG 24 hr capsule Take 1 capsule (60 mg total) by mouth at bedtime. 30 capsule 1   Rimegepant Sulfate  (NURTEC) 75 MG TBDP Take 1 tablet by mouth daily as needed. 10 tablet 11   Tuberculin-Allergy  Syringes (VANISHPOINT TUBERCULIN SYRINGE) 25G X 1" 1 ML MISC Use as directed (for b12 inj) for up to 6 doses     Ubrogepant  (UBRELVY ) 100 MG TABS Take at the onset of migraine. Can repeat in 2 hours if needed. Only 2 tabs in 24 hours. 10 tablet 12   valACYclovir  (VALTREX ) 1000 MG tablet Take 2 tablets (2,000 mg total) by mouth 2 (two) times daily. Take for 2 days and monitor 10 tablet 0   venlafaxine  XR (EFFEXOR -XR) 37.5 MG 24 hr capsule Take 1 capsule (37.5 mg total) by mouth daily with breakfast. Take daily along with 75 mg daily , total of 112.5 mg daily 90 capsule 1   venlafaxine  XR (EFFEXOR -XR) 75 MG 24 hr capsule Take 1 capsule (75 mg total) by mouth daily. Take along with 37.5 mg daily 90 capsule 1   No current facility-administered medications for this visit.     Musculoskeletal: Strength & Muscle Tone:  UTA Gait & Station:  Seated Patient leans: N/A  Psychiatric Specialty Exam: Review of Systems  Psychiatric/Behavioral:  Positive for dysphoric mood. The patient is nervous/anxious.     There were no vitals taken for this visit.There is no height or weight on file to calculate BMI.  General Appearance: Casual  Eye Contact:  Fair  Speech:  Clear and Coherent  Volume:  Normal  Mood:  Anxious and Dysphoric improving  Affect:  Congruent  Thought Process:  Goal Directed and Descriptions of Associations: Intact  Orientation:  Full (Time, Place, and Person)  Thought  Content: Logical   Suicidal Thoughts:  No  Homicidal Thoughts:  No  Memory:  Immediate;   Fair Recent;   Fair Remote;   Fair  Judgement:  Fair  Insight:  Fair  Psychomotor Activity:  Normal  Concentration:  Concentration: Fair and Attention Span: Fair  Recall:  [employer] of Knowledge: Fair  Language: Fair  Akathisia:  No  Handed:  Right  AIMS (if indicated): not done  Assets:  Desire for Improvement Housing Social Support Transportation  ADL's:  Intact  Cognition: WNL  Sleep:  Fair   Screenings: GAD-7    Garment/textile technologist Visit from 11/02/2023 in [facility] Office Visit from 09/23/2022 in [facility] Office Visit from 05/26/2022 in [facility] Office Visit from 02/19/2022 in [facility]  Total GAD-7 Score 12 5 4 5       PHQ2-9    Flowsheet Row Office Visit from 11/02/2023 in [facility] Counselor from 11/20/2022 in [facility] Visit from 09/23/2022 in [facility] Office Visit from 05/26/2022 in [facility] Counselor from 05/13/2022 in [facility] Total Score 2 1 2  0 1  PHQ-9 Total Score 9 7 5 2  --      Flowsheet Row Video Visit from 01/07/2024 in [facility] Office Visit from 11/02/2023 in [facility] Video Visit from 10/07/2023 in [facility]  C-SSRS [name] CATEGORY No [name] No [name] Low [name]        Assessment and Plan:Candice [name] is a 38 year old Caucasian female, employed, married, has a history of depression, anxiety, was evaluated by telemedicine today, discussed  assessment and plan as noted below.  Assessment & Plan Depression in partial remission Depression with intermittent anxiety and irritability. Difficulty adhering to Wellbutrin  regimen, often missing the second dose. No suicidal ideation. Sleep generally adequate, though occasionally interrupted. Mood fluctuations noted by a friend, but she does not feel significantly different. Current medication includes venlafaxine  and Wellbutrin , with inconsistent adherence to the latter. Discussed importance of regular medication adherence to evaluate efficacy before considering dosage adjustments or additional medications. If adherence improves and symptoms persist, consider dosage increase or additional medication. - Take Wellbutrin  150 mg twice daily consistently for 6-8 weeks. - Set a reminder to take the second dose of Wellbutrin  with lunch. - Continue Venlafaxine  extended release 112.5 mg daily as prescribed. -- Encourage continued therapy sessions with Katie Bound  Anxiety disorder-improving Does report anxiety symptoms as improving due to situational stressors resolved.  Does have episodic anxiety although coping better. - Continue psychotherapy sessions with Ms. Alston Jerry Bound - Continue Venlafaxine  extended release 112.5 mg daily - Continue Xanax  0.25 mg as needed for severe anxiety, limiting use.  Insomnia-stable Currently reports sleep is overall good. - Continue sleep hygiene techniques.  Vitamin B12 deficiency Low B12 levels, previously treated with intramuscular injections. Currently taking oral B12 supplements. Discussed potential impact of low B12 on mood and energy levels. Recommended re-evaluation of B12 levels to ensure adequacy, as low levels can contribute to symptoms of fatigue and low mood. - Check B12 levels at next primary care appointment in June.   Follow-up Follow-up in clinic in 2 to 3 months or sooner if needed.   Collaboration of Care: Collaboration of Care: Referral or  follow-up with counselor/therapist AEB encouraged to continue psychotherapy sessions.  Patient/Guardian was advised Release of Information must be obtained prior to any record release in  order to collaborate their care with an outside provider. Patient/Guardian was advised if they have not already done so to contact the registration department to sign all necessary forms in order for us  to release information regarding their care.   Consent: Patient/Guardian gives verbal consent for treatment and assignment of benefits for services provided during this visit. Patient/Guardian expressed understanding and agreed to proceed.   This note was generated in part or whole with voice recognition software. Voice recognition is usually quite accurate but there are transcription errors that can and very often do occur. I apologize for any typographical errors that were not detected and corrected.    Nattaly Yebra, MD 01/07/2024, 5:42 PM

## 2024-01-12 ENCOUNTER — Other Ambulatory Visit: Payer: Self-pay | Admitting: Psychiatry

## 2024-01-12 ENCOUNTER — Other Ambulatory Visit: Payer: Self-pay

## 2024-01-12 DIAGNOSIS — F3342 Major depressive disorder, recurrent, in full remission: Secondary | ICD-10-CM

## 2024-01-13 ENCOUNTER — Other Ambulatory Visit: Payer: Self-pay

## 2024-01-13 MED FILL — Bupropion HCl Tab ER 12HR 150 MG: ORAL | 90 days supply | Qty: 180 | Fill #0 | Status: AC

## 2024-01-20 ENCOUNTER — Encounter: Payer: Self-pay | Admitting: Adult Health

## 2024-01-20 ENCOUNTER — Ambulatory Visit (INDEPENDENT_AMBULATORY_CARE_PROVIDER_SITE_OTHER): Payer: Commercial Managed Care - PPO | Admitting: Adult Health

## 2024-01-20 DIAGNOSIS — G43709 Chronic migraine without aura, not intractable, without status migrainosus: Secondary | ICD-10-CM

## 2024-01-20 MED ORDER — ONABOTULINUMTOXINA 200 UNITS IJ SOLR
155.0000 [IU] | Freq: Once | INTRAMUSCULAR | Status: AC
  Administered 2024-01-20: 155 [IU] via INTRAMUSCULAR

## 2024-01-20 NOTE — Progress Notes (Signed)
 Botox - 200 units x 1 vial Lot: [id number] Expiration: 06/2026 NDC: 5409-8119-14   Bacteriostatic 0.9% Sodium Chloride - 4 mL  Lot: [id number] Expiration: 07/09/2024 NDC: 2130-8657-84   Dx: G43.009 BB Witnessd by Samson Croak. NP

## 2024-01-20 NOTE — Progress Notes (Signed)
 01/20/24: Last Wednesday got a headache behind the right eye that has lingered. Reports that she has not been compliant with qulipta  and other meds. Nurtec typically works well.   10/27/23: Reports that she usually gets several days of breakthrough headaches before Botox .  Only had 2 days of headaches before her Botox  this time. Has not been taking qulipta   regularly. Nurtec works well to get rd of headaces.   07/29/23: Last two weeks have had headaches. Had one headache that lasted 9 days. Got a steriod pack but not sure it has helped. Still doing qulipta  daily. In the last 5-7 days doing nurtec daily.   05/06/23: Reports that she did start having breakthrough headaches 2 to 3 weeks before Botox  today.  She did try taking Nurtec several days in a row.  She also has Ubrelvy  for abortive therapy.  02/05/23: Botox  works well.  Also taking Qulipta  daily.  She has Nurtec and Ubrelvy  for abortive therapy.  She is advised not to take these medications together.  She saw Dr. Tresia Fruit in April for an office visit  10/30/22: Botox  continues to work well.  After the last injection she developed a mild headache with nausea and vomiting.  This lasted about 6 days and then resolved.  She never had the symptoms after any previous Botox .  Advised patient that I did not feel that this was related to her Botox  injections.  Also discussed with Dr. Omar Bibber who did not feel this would be related to her Botox  injections  08/04/22: Reports that Botox  works well except for the last 2 weeks if she starts to have breakthrough headaches.  She states that she typically has 2 mild headaches during the week that she can take Excedrin Migraine.  Has not had any severe headaches.  Tries to take Nurtec as a preventative medicine 2 weeks before her Botox  is due.  Will use Ubrelvy  as an abortive medication if needed  05/06/22: Botox  is working well.  She is reports that she has been having trouble with vertigo not associated with her  migraines.  Will be start vestibular rehab  02/06/22: Tried nurtec daily for the last week but not sure it helped? Maybe knocked the edge off. Otherwise her headaches are controlled. Has a mild headache 1-2 times a week. Usually will try OTC medicine first then will use ubrelvy . Encouraged her to use the ubrelvy  first thing.  11/12/20: Patient states that Botox  is working well for her.  She states the week before her Botox  is due she tends to have more headaches.  She is using the Ubrelvy  and sometimes nurtrec. Advised that she could try taking nurtec daily 5 days before her cycle to try to prevent migraines.    BOTOX  PROCEDURE NOTE FOR MIGRAINE HEADACHE    Contraindications and precautions discussed with patient(above). Aseptic procedure was observed and patient tolerated procedure. Procedure performed by Clem Currier, NP  The condition has existed for more than 6 months, and pt does not have a diagnosis of ALS, Myasthenia Gravis or Lambert-Eaton Syndrome.  Risks and benefits of injections discussed and pt agrees to proceed with the procedure.  Written consent obtained  These injections are medically necessary. These injections do not cause sedations or hallucinations which the oral therapies may cause.  Indication/Diagnosis: chronic migraine BOTOX (Z6109) injection was performed according to protocol by [employer]. 200 units of BOTOX  was dissolved into 4 cc NS.   NDC: [address]-01  Type of toxin: Botox   Botox - 200 units  x 1 vial Lot: [id number] Expiration: 06/2026 NDC: 5409-8119-14   Bacteriostatic 0.9% Sodium Chloride - 4 mL  Lot: [id number] Expiration: 07/09/2024 NDC: 2130-8657-84   Dx: O96.295         Description of procedure:  The patient was placed in a sitting position. The standard protocol was used for Botox  as follows, with 5 units of Botox  injected at each site:   -Procerus muscle, midline injection  -Corrugator muscle, bilateral injection  -Frontalis muscle,  bilateral injection, with 2 sites each side, medial injection was performed in the upper one third of the frontalis muscle, in the region vertical from the medial inferior edge of the superior orbital rim. The lateral injection was again in the upper one third of the forehead vertically above the lateral limbus of the cornea, 1.5 cm lateral to the medial injection site.  -Temporalis muscle injection, 4 sites, bilaterally. The first injection was 3 cm above the tragus of the ear, second injection site was 1.5 cm to 3 cm up from the first injection site in line with the tragus of the ear. The third injection site was 1.5-3 cm forward between the first 2 injection sites. The fourth injection site was 1.5 cm posterior to the second injection site.  -Occipitalis muscle injection, 3 sites, bilaterally. The first injection was done one half way between the occipital protuberance and the tip of the mastoid process behind the ear. The second injection site was done lateral and superior to the first, 1 fingerbreadth from the first injection. The third injection site was 1 fingerbreadth superiorly and medially from the first injection site.  -Cervical paraspinal muscle injection, 2 sites, bilateral knee first injection site was 1 cm from the midline of the cervical spine, 3 cm inferior to the lower border of the occipital protuberance. The second injection site was 1.5 cm superiorly and laterally to the first injection site.  -Trapezius muscle injection was performed at 3 sites, bilaterally. The first injection site was in the upper trapezius muscle halfway between the inflection point of the neck, and the acromion. The second injection site was one half way between the acromion and the first injection site. The third injection was done between the first injection site and the inflection point of the neck.   Will return for repeat injection in 3 months.   A 200 unitsof Botox  was used, 155 units were injected, the  rest of the Botox  was wasted. The patient tolerated the procedure well, there were no complications of the above procedure.  Clem Currier, MSN, NP-C 01/20/2024, 9:09 AM [facility] [address] [place], [place] [address] [phone]

## 2024-02-04 DIAGNOSIS — F411 Generalized anxiety disorder: Secondary | ICD-10-CM | POA: Diagnosis not present

## 2024-02-10 ENCOUNTER — Other Ambulatory Visit: Payer: Self-pay

## 2024-02-10 DIAGNOSIS — R635 Abnormal weight gain: Secondary | ICD-10-CM | POA: Diagnosis not present

## 2024-02-10 DIAGNOSIS — G43009 Migraine without aura, not intractable, without status migrainosus: Secondary | ICD-10-CM | POA: Diagnosis not present

## 2024-02-10 DIAGNOSIS — E538 Deficiency of other specified B group vitamins: Secondary | ICD-10-CM | POA: Diagnosis not present

## 2024-02-10 DIAGNOSIS — F331 Major depressive disorder, recurrent, moderate: Secondary | ICD-10-CM | POA: Diagnosis not present

## 2024-02-10 DIAGNOSIS — Z0000 Encounter for general adult medical examination without abnormal findings: Secondary | ICD-10-CM | POA: Diagnosis not present

## 2024-02-10 DIAGNOSIS — F410 Panic disorder [episodic paroxysmal anxiety] without agoraphobia: Secondary | ICD-10-CM | POA: Diagnosis not present

## 2024-02-10 DIAGNOSIS — Z1331 Encounter for screening for depression: Secondary | ICD-10-CM | POA: Diagnosis not present

## 2024-02-10 DIAGNOSIS — E559 Vitamin D deficiency, unspecified: Secondary | ICD-10-CM | POA: Diagnosis not present

## 2024-02-10 DIAGNOSIS — F411 Generalized anxiety disorder: Secondary | ICD-10-CM | POA: Diagnosis not present

## 2024-02-10 MED ORDER — VALACYCLOVIR HCL 1 G PO TABS
2000.0000 mg | ORAL_TABLET | Freq: Two times a day (BID) | ORAL | 0 refills | Status: AC
  Filled 2024-02-10: qty 10, 3d supply, fill #0

## 2024-02-10 MED ORDER — LEVONORGESTREL-ETHINYL ESTRAD 0.15-30 MG-MCG PO TABS
1.0000 | ORAL_TABLET | Freq: Every day | ORAL | 1 refills | Status: DC
  Filled 2024-02-10 – 2024-02-15 (×2): qty 112, 84d supply, fill #0
  Filled 2024-05-23: qty 112, 84d supply, fill #1

## 2024-02-11 DIAGNOSIS — E538 Deficiency of other specified B group vitamins: Secondary | ICD-10-CM | POA: Diagnosis not present

## 2024-02-11 DIAGNOSIS — E559 Vitamin D deficiency, unspecified: Secondary | ICD-10-CM | POA: Diagnosis not present

## 2024-02-11 DIAGNOSIS — R635 Abnormal weight gain: Secondary | ICD-10-CM | POA: Diagnosis not present

## 2024-02-11 DIAGNOSIS — F411 Generalized anxiety disorder: Secondary | ICD-10-CM | POA: Diagnosis not present

## 2024-02-11 DIAGNOSIS — Z0000 Encounter for general adult medical examination without abnormal findings: Secondary | ICD-10-CM | POA: Diagnosis not present

## 2024-02-12 ENCOUNTER — Other Ambulatory Visit: Payer: Self-pay

## 2024-02-12 MED ORDER — ERGOCALCIFEROL 1.25 MG (50000 UT) PO CAPS
50000.0000 [IU] | ORAL_CAPSULE | ORAL | 1 refills | Status: AC
  Filled 2024-02-12: qty 12, 84d supply, fill #0
  Filled 2024-05-10: qty 12, 84d supply, fill #1

## 2024-02-15 ENCOUNTER — Other Ambulatory Visit: Payer: Self-pay

## 2024-02-23 ENCOUNTER — Other Ambulatory Visit: Payer: Self-pay | Admitting: Adult Health

## 2024-02-24 ENCOUNTER — Other Ambulatory Visit: Payer: Self-pay

## 2024-02-24 MED ORDER — NURTEC 75 MG PO TBDP
1.0000 | ORAL_TABLET | Freq: Every day | ORAL | 2 refills | Status: DC | PRN
  Filled 2024-02-24: qty 16, 32d supply, fill #0
  Filled 2024-02-24: qty 16, 30d supply, fill #0
  Filled 2024-03-30: qty 8, 16d supply, fill #1
  Filled 2024-05-10: qty 6, 10d supply, fill #2

## 2024-03-30 ENCOUNTER — Other Ambulatory Visit: Payer: Self-pay

## 2024-03-30 ENCOUNTER — Other Ambulatory Visit: Payer: Self-pay | Admitting: Psychiatry

## 2024-03-30 DIAGNOSIS — F418 Other specified anxiety disorders: Secondary | ICD-10-CM

## 2024-03-31 ENCOUNTER — Other Ambulatory Visit: Payer: Self-pay | Admitting: Psychiatry

## 2024-03-31 ENCOUNTER — Other Ambulatory Visit: Payer: Self-pay

## 2024-03-31 DIAGNOSIS — F418 Other specified anxiety disorders: Secondary | ICD-10-CM

## 2024-04-01 ENCOUNTER — Other Ambulatory Visit: Payer: Self-pay

## 2024-04-01 ENCOUNTER — Telehealth: Payer: Self-pay | Admitting: Psychiatry

## 2024-04-01 MED FILL — Venlafaxine HCl Cap ER 24HR 75 MG (Base Equivalent): ORAL | 90 days supply | Qty: 90 | Fill #0 | Status: CN

## 2024-04-01 NOTE — Telephone Encounter (Signed)
 Please contact to schedule an appointment.  I do not see an appointment  scheduled for follow-up.

## 2024-04-14 ENCOUNTER — Encounter: Payer: Self-pay | Admitting: Adult Health

## 2024-04-14 ENCOUNTER — Other Ambulatory Visit: Payer: Self-pay

## 2024-04-14 MED ORDER — METHYLPREDNISOLONE 4 MG PO TBPK
ORAL_TABLET | ORAL | 0 refills | Status: AC
Start: 2024-04-14 — End: ?
  Filled 2024-04-14: qty 21, 6d supply, fill #0

## 2024-04-14 MED FILL — Bupropion HCl Tab ER 12HR 150 MG: ORAL | 90 days supply | Qty: 180 | Fill #1 | Status: AC

## 2024-04-18 ENCOUNTER — Ambulatory Visit: Payer: Commercial Managed Care - PPO | Admitting: Adult Health

## 2024-04-19 ENCOUNTER — Other Ambulatory Visit: Payer: Self-pay

## 2024-04-19 ENCOUNTER — Telehealth: Admitting: Psychiatry

## 2024-04-19 ENCOUNTER — Encounter: Payer: Self-pay | Admitting: Psychiatry

## 2024-04-19 DIAGNOSIS — F418 Other specified anxiety disorders: Secondary | ICD-10-CM | POA: Diagnosis not present

## 2024-04-19 DIAGNOSIS — F330 Major depressive disorder, recurrent, mild: Secondary | ICD-10-CM

## 2024-04-19 DIAGNOSIS — F5105 Insomnia due to other mental disorder: Secondary | ICD-10-CM

## 2024-04-19 MED ORDER — TRAZODONE HCL 50 MG PO TABS
25.0000 mg | ORAL_TABLET | Freq: Every evening | ORAL | 0 refills | Status: AC | PRN
  Filled 2024-04-19: qty 30, 30d supply, fill #0

## 2024-04-19 NOTE — Patient Instructions (Signed)
 Trazodone  Tablets What is this medication? TRAZODONE  (TRAZ oh done) treats depression. It increases the amount of serotonin in the brain, a hormone that helps regulate mood. This medicine may be used for other purposes; ask your health care provider or pharmacist if you have questions. COMMON BRAND NAME(S): Desyrel  What should I tell my care team before I take this medication? They need to know if you have any of these conditions: Bipolar disorder Bleeding disorder Glaucoma Heart disease, or previous heart attack Irregular heartbeat or rhythm Kidney disease Liver disease Low levels of sodium in the blood Suicidal thoughts, plans, or attempt by you or a family member An unusual or allergic reaction to trazodone , other medications, foods, dyes, or preservatives Pregnant or trying to get pregnant Breastfeeding How should I use this medication? Take this medication by mouth with a glass of water. Take it as directed on the prescription label at the same time every day. Take this medication shortly after a meal or a light snack. Keep taking this medication unless your care team tells you to stop. Stopping it too quickly can cause serious side effects. It can also make your condition worse. A special MedGuide will be given to you by the pharmacist with each prescription and refill. Be sure to read this information carefully each time. Talk to your care team about the use of this medication in children. Special care may be needed. Overdosage: If you think you have taken too much of this medicine contact a poison control center or emergency room at once. NOTE: This medicine is only for you. Do not share this medicine with others. What if I miss a dose? If you miss a dose, take it as soon as you can. If it is almost time for your next dose, take only that dose. Do not take double or extra doses. What may interact with this medication? Do not take this medication with any of the following: Certain  medications for fungal infections, such as fluconazole , itraconazole, ketoconazole, posaconazole, voriconazole Cisapride Dronedarone Linezolid MAOIs, such as Carbex, Eldepryl, Marplan, Nardil, and Parnate Mesoridazine Methylene blue (injected into a vein) Pimozide Saquinavir Thioridazine This medication may also interact with the following: Alcohol Antiviral medications for HIV or AIDS Aspirin and aspirin-like medications Barbiturates, such as phenobarbital Certain medications for blood pressure, heart disease, irregular heart beat Certain medications for mental health conditions Certain medications for migraine headache, such as almotriptan, eletriptan , frovatriptan, naratriptan, rizatriptan, sumatriptan , zolmitriptan  Certain medications for seizures, such as carbamazepine and phenytoin Certain medications for sleep Certain medications that treat or prevent blood clots, such as dalteparin, enoxaparin, warfarin Digoxin Fentanyl  Lithium NSAIDS, medications for pain and inflammation, such as ibuprofen  or naproxen  Other medications that cause heart rhythm changes Rasagiline Supplements, such as St. John's wort, kava kava, valerian Tramadol Tryptophan This list may not describe all possible interactions. Give your health care provider a list of all the medicines, herbs, non-prescription drugs, or dietary supplements you use. Also tell them if you smoke, drink alcohol, or use illegal drugs. Some items may interact with your medicine. What should I watch for while using this medication? Visit your care team for regular checks on your progress. Tell your care team if your symptoms do not start to get better or if they get worse. Because it may take several weeks to see the full effects of this medication, it is important to continue your treatment as prescribed by your care team. Watch for new or worsening thoughts of suicide or depression. This  includes sudden changes in mood, behaviors,  or thoughts. These changes can happen at any time but are more common in the beginning of treatment or after a change in dose. Call your care team right away if you experience these thoughts or worsening depression. This medication may cause mood and behavior changes, such as anxiety, nervousness, irritability, hostility, restlessness, excitability, hyperactivity, or trouble sleeping. These changes can happen at any time but are more common in the beginning of treatment or after a change in dose. Call your care team right away if you notice any of these symptoms. This medication may affect your coordination, reaction time, or judgment. Do not drive or operate machinery until you know how this medication affects you. Sit up or stand slowly to reduce the risk of dizzy or fainting spells. Drinking alcohol with this medication can increase the risk of these side effects. This medication may cause dry eyes and blurred vision. If you wear contact lenses you may feel some discomfort. Lubricating drops may help. See your care team if the problem does not go away or is severe. Your mouth may get dry. Chewing sugarless gum or sucking hard candy and drinking plenty of water may help. Contact your care team if the problem does not go away or is severe. What side effects may I notice from receiving this medication? Side effects that you should report to your care team as soon as possible: Allergic reactions--skin rash, itching, hives, swelling of the face, lips, tongue, or throat Bleeding--bloody or black, tar-like stools, red or dark brown urine, vomiting blood or brown material that looks like coffee grounds, small, red or purple spots on skin, unusual bleeding or bruising Heart rhythm changes--fast or irregular heartbeat, dizziness, feeling faint or lightheaded, chest pain, trouble breathing Low blood pressure--dizziness, feeling faint or lightheaded, blurry vision Low sodium level--muscle weakness, fatigue,  dizziness, headache, confusion Prolonged or painful erection Serotonin syndrome--irritability, confusion, fast or irregular heartbeat, muscle stiffness, twitching muscles, sweating, high fever, seizures, chills, vomiting, diarrhea Sudden eye pain or change in vision such as blurry vision, seeing halos around lights, vision loss Thoughts of suicide or self-harm, worsening mood, feelings of depression Side effects that usually do not require medical attention (report to your care team if they continue or are bothersome): Change in sex drive or performance Constipation Dizziness Drowsiness Dry mouth This list may not describe all possible side effects. Call your doctor for medical advice about side effects. You may report side effects to FDA at 1-800-FDA-1088. Where should I keep my medication? Keep out of the reach of children and pets. Store at room temperature between 15 and 30 degrees C (59 to 86 degrees F). Protect from light. Keep container tightly closed. Throw away any unused medication after the expiration date. NOTE: This sheet is a summary. It may not cover all possible information. If you have questions about this medicine, talk to your doctor, pharmacist, or health care provider.  2024 Elsevier/Gold Standard (2022-08-21 00:00:00)

## 2024-04-19 NOTE — Progress Notes (Signed)
 Virtual Visit via Video Note  I connected with Candice [name] on 04/19/24 at  4:00 PM EDT by a video enabled telemedicine application and verified that I am speaking with the correct person using two identifiers.  Location Provider Location : [facility] Patient Location : Home  Participants: Patient , Provider    I discussed the limitations of evaluation and management by telemedicine and the availability of in person appointments. The patient expressed understanding and agreed to proceed.   I discussed the assessment and treatment plan with the patient. The patient was provided an opportunity to ask questions and all were answered. The patient agreed with the plan and demonstrated an understanding of the instructions.   The patient was advised to call back or seek an in-person evaluation if the symptoms worsen or if the condition fails to improve as anticipated.  [facility] MD OP Progress Note  04/19/2024 5:57 PM Candice [name]  MRN:  [id number]  Chief Complaint:  Chief Complaint  Patient presents with   Follow-up   Anxiety   Depression   Medication Refill   Discussed the use of AI scribe software for clinical note transcription with the patient, who gave verbal consent to proceed.  History of Present Illness Candice [name] is a 38 year old Caucasian female, employed, lives in [place], has a history of MDD, anxiety disorder, insomnia, bilateral hearing loss, migraine headaches was evaluated by telemedicine today.  She reports stable overall mood and functioning, expressing satisfaction with her current medication regimen. She reports improved adherence to her Wellbutrin  regimen, as she has missed the second dose only a few times since her last appointment but otherwise remains consistent. Her current medications include Wellbutrin , with the second dose typically around 2 PM, and venlafaxine . She states she manages her work and focus even with recent stressors.  Ongoing sleep  difficulties persist, including persistent tiredness, frequent awakenings throughout the night, and never feeling fully rested in the morning. She reports the ability to fall back asleep after waking but cannot recall the last time she experienced a good night's sleep.She reports a trial of zolpidem  (Ambien ) in 2015, though she does not recall taking it. She has used melatonin in the past for sleep.She expresses concerns about potential grogginess from sleep medications.  She inquires about the side effects of Wellbutrin  and venlafaxine , particularly regarding weight gain.  She identifies significant psychosocial stressors related to her husband's recent job loss, which has impacted their financial situation and required her to focus much of her energy on supporting his mental health. She describes her husband as having difficulty functioning and lacking initiative following the job loss, which has increased her stress and responsibility at home. She reports managing her own mental health and daily responsibilities.  She denies any thoughts about hurting herself when asked directly.      Visit Diagnosis:    ICD-10-CM   1. MDD (major depressive disorder), recurrent episode, mild (HCC)  F33.0     2. Other specified anxiety disorders  F41.8    With limited symptom attacks    3. Insomnia due to mental condition  F51.05 traZODone  (DESYREL ) 50 MG tablet   Mood, pain      Past Psychiatric History: I have reviewed past psychiatric history from progress note on 01/10/2019.  Past Medical History:  Past Medical History:  Diagnosis Date   Anxiety    Depression    Hearing loss    Migraines    a. x 10+ years.   Postpartum care  following cesarean delivery (5/29) 02/04/2014   Pre-syncope    a. 07/2015 [facility] ED visit, ? vasovagal, improved with IVF.   Short cervix in second trimester, antepartum 11/16/2013    Past Surgical History:  Procedure Laterality Date   CESAREAN SECTION N/A 02/03/2014    Procedure: Primary Cesarean Section Delivery Baby Girl @ 2244, Apgars 9/9;  Surgeon: Dickie DELENA Carder, MD;  Location: [facility];  Service: Obstetrics;  Laterality: N/A;   CESAREAN SECTION     WISDOM TOOTH EXTRACTION  2002    Family Psychiatric History: I have reviewed family psychiatric history from progress note on 01/10/2019.  Family History:  Family History  Problem Relation Age of Onset   Depression Mother    Anxiety disorder Mother    Heart attack Mother        NSTEMI @ age 75.   Migraines Mother    Sleep apnea Father        alive @ 69.   Anxiety disorder Maternal Grandmother    Depression Maternal Grandmother    Diabetes Maternal Grandmother    Hypertension Maternal Grandmother    Heart attack Maternal Grandfather    Alcohol abuse Paternal Grandfather    Suicidality Paternal Grandfather    Seizures Daughter     Social History: I have reviewed social history from progress note on 01/10/2019. Social History   Socioeconomic History   Marital status: Married    Spouse name: Not on file   Number of children: 1   Years of education: Not on file   Highest education level: Not on file  Occupational History   Not on file  Tobacco Use   Smoking status: Never   Smokeless tobacco: Never  Vaping Use   Vaping status: Never Used  Substance and Sexual Activity   Alcohol use: Yes    Alcohol/week: 0.0 standard drinks of alcohol    Comment: maybe two drinks/month.   Drug use: No   Sexual activity: Yes    Birth control/protection: Pill  Other Topics Concern   Not on file  Social History Narrative   Lives in [place] with her husband and 60 yr old daughter.  Does not routinely exercise.   Caffeine : 1 cup coffee/day, some soda , 1-2 cups of caffeine /day   Social Drivers of Health   Financial Resource Strain: Low Risk  (02/10/2024)   Received from [facility]   Overall Financial Resource Strain (CARDIA)    Difficulty of Paying Living Expenses: Not hard at all   Food Insecurity: No Food Insecurity (02/10/2024)   Received from [facility]   Hunger Vital Sign    Within the past 12 months, you worried that your food would run out before you got the money to buy more.: Never true    Within the past 12 months, the food you bought just didn't last and you didn't have money to get more.: Never true  Transportation Needs: No Transportation Needs (02/10/2024)   Received from [facility] - Transportation    In the past 12 months, has lack of transportation kept you from medical appointments or from getting medications?: No    Lack of Transportation (Non-Medical): No  Physical Activity: Inactive (07/13/2018)   Exercise Vital Sign    Days of Exercise per Week: 0 days    Minutes of Exercise per Session: 0 min  Stress: Not on file  Social Connections: Unknown (07/13/2018)   Social Connection and Isolation Panel  Frequency of Communication with Friends and Family: Not on file    Frequency of Social Gatherings with Friends and Family: Not on file    Attends Religious Services: Not on file    Active Member of Clubs or Organizations: Not on file    Attends Banker Meetings: Not on file    Marital Status: Married    Allergies:  Allergies  Allergen Reactions   Elemental Sulfur Hives   Sulfa Antibiotics Other (See Comments) and Hives    Metabolic Disorder Labs: No results found for: HGBA1C, MPG No results found for: PROLACTIN No results found for: CHOL, TRIG, HDL, CHOLHDL, VLDL, LDLCALC Lab Results  Component Value Date   TSH 0.696 07/12/2015    Therapeutic Level Labs: No results found for: LITHIUM No results found for: VALPROATE No results found for: CBMZ  Current Medications: Current Outpatient Medications  Medication Sig Dispense Refill   traZODone  (DESYREL ) 50 MG tablet Take 0.5-1 tablets (25-50 mg total) by mouth at bedtime as needed for sleep. 30 tablet 0    ALPRAZolam  (XANAX ) 0.25 MG tablet Take 1 tablet (0.25 mg total) by mouth at bedtime as needed for anxiety. 15 tablet 0   Atogepant  (QULIPTA ) 60 MG TABS Take 1 tablet (60 mg total) by mouth daily. 30 tablet 11   buPROPion  (WELLBUTRIN  SR) 150 MG 12 hr tablet Take 1 tablet (150 mg total) by mouth 2 (two) times daily. Take daily at 8:30 AM and at 2 PM 180 tablet 1   cyanocobalamin  (VITAMIN B12) 1000 MCG/ML injection Inject 1 mL (1,000 mcg total) into the muscle every 30 (thirty) days. (Patient not taking: Reported on 04/20/2024) 1 mL 5   ergocalciferol  (VITAMIN D2) 1.25 MG (50000 UT) capsule Take 1 capsule (50,000 Units total) by mouth once a week. (Patient not taking: Reported on 04/20/2024) 12 capsule 1   ergocalciferol  (VITAMIN D2) 1.25 MG (50000 UT) capsule Take 1 capsule (50,000 Units total) by mouth once a week. 12 capsule 1   levonorgestrel -ethinyl estradiol  (NORDETTE) 0.15-30 MG-MCG tablet Take 1 tablet by mouth daily. 112 tablet 1   methylPREDNISolone  (MEDROL  DOSEPAK) 4 MG TBPK tablet Per package instructions 21 tablet 0   ondansetron  (ZOFRAN -ODT) 8 MG disintegrating tablet Dissolve 1 tablet (8 mg total) in mouth every 8 (eight) hours as needed. 20 tablet 2   propranolol  ER (INDERAL  LA) 60 MG 24 hr capsule Take 1 capsule (60 mg total) by mouth at bedtime. 30 capsule 1   Rimegepant Sulfate  (NURTEC) 75 MG TBDP Take 1 tablet by mouth daily as needed. 10 tablet 2   Tuberculin-Allergy  Syringes (VANISHPOINT TUBERCULIN SYRINGE) 25G X 1 1 ML MISC Use as directed (for b12 inj) for up to 6 doses (Patient not taking: Reported on 04/20/2024)     Ubrogepant  (UBRELVY ) 100 MG TABS Take at the onset of migraine. Can repeat in 2 hours if needed. Only 2 tabs in 24 hours. 10 tablet 12   valACYclovir  (VALTREX ) 1000 MG tablet Take 2 tablets (2,000 mg total) by mouth 2 (two) times daily. Take for 2 days and monitor. 10 tablet 0   venlafaxine  XR (EFFEXOR -XR) 37.5 MG 24 hr capsule Take 1 capsule (37.5 mg total) by mouth  daily with breakfast. Take daily along with 75 mg daily , total of 112.5 mg daily 90 capsule 1   venlafaxine  XR (EFFEXOR -XR) 75 MG 24 hr capsule Take 1 capsule (75 mg total) by mouth daily. Take along with 37.5 mg daily 90 capsule 1   Zavegepant HCl (  ZAVZPRET ) 10 MG/ACT SOLN Place 10 mg into the nose daily as needed. For migraine. Only 1 spray in 24 hours.     No current facility-administered medications for this visit.     Musculoskeletal: Strength & Muscle Tone: UTA Gait & Station: Seated Patient leans: N/A  Psychiatric Specialty Exam: Review of Systems  Psychiatric/Behavioral:  Positive for sleep disturbance. The patient is nervous/anxious.     There were no vitals taken for this visit.There is no height or weight on file to calculate BMI.  General Appearance: Fairly Groomed  Eye Contact:  Fair  Speech:  Clear and Coherent  Volume:  Normal  Mood:  Anxious  Affect:  Appropriate  Thought Process:  Goal Directed and Descriptions of Associations: Intact  Orientation:  Full (Time, Place, and Person)  Thought Content: Logical   Suicidal Thoughts:  No  Homicidal Thoughts:  No  Memory:  Immediate;   Fair Recent;   Fair Remote;   Fair  Judgement:  Fair  Insight:  Fair  Psychomotor Activity:  Normal  Concentration:  Concentration: Fair and Attention Span: Fair  Recall:  [employer] of Knowledge: Fair  Language: Fair  Akathisia:  No  Handed:  Right  AIMS (if indicated): not done  Assets:  Communication Skills Desire for Improvement Housing Social Support  ADL's:  Intact  Cognition: WNL  Sleep:  Poor   Screenings: GAD-7    Loss adjuster, chartered Office Visit from 11/02/2023 in [facility] Office Visit from 09/23/2022 in [facility] Office Visit from 05/26/2022 in [facility] Office Visit from 02/19/2022 in [facility]  Total  GAD-7 Score 12 5 4 5    PHQ2-9    Flowsheet Row Office Visit from 11/02/2023 in [facility] Counselor from 11/20/2022 in [facility] Visit from 09/23/2022 in [facility] Office Visit from 05/26/2022 in [facility] Counselor from 05/13/2022 in [facility] Total Score 2 1 2  0 1  PHQ-9 Total Score 9 7 5 2  --   Flowsheet Row Video Visit from 04/19/2024 in [facility] Video Visit from 01/07/2024 in [facility] Office Visit from 11/02/2023 in [facility]  C-SSRS RISK CATEGORY No Risk No Risk No Risk     Assessment and Plan: Candice [name] is a 38 year old Caucasian female, employed, married, has a history of depression, anxiety was evaluated by telemedicine today.  Discussed assessment and plan as noted below.  Depression in partial remission Currently reports overall improvement in her intermittent irritability, depression symptoms.  Compliant on medication regimen.  Currently reports taking the medication regularly which has helped. Continue Wellbutrin  150 mg twice daily. Continue Venlafaxine  extended release 112.5 mg daily Continue psychotherapy sessions with Ms. Izetta Bound   Anxiety disorder-improving Currently reports overall managing anxiety well , does have multiple situational challenges, reports using coping strategies. Continue psychotherapy sessions. Continue Venlafaxine  extended release 112.5 mg daily Continue Xanax  0.25 mg as needed for severe anxiety.  Insomnia-unstable Currently reports sleep is interrupted and does not feel rested when she wakes up in the morning.  Agreeable to trial of sleep medication, discussed zolpidem  which she may have  tried previously as well as trazodone .  Interested in trial of trazodone . Encouraged to continue sleep  hygiene techniques. Start Trazodone  25-50 mg at bedtime as needed.    Follow-up Follow-up will clinic in 3 months or sooner if needed.  Collaboration of Care: Collaboration of Care: Referral or follow-up with counselor/therapist AEB encouraged to continue psychotherapy sessions.  Patient/Guardian was advised Release of Information must be obtained prior to any record release in order to collaborate their care with an outside provider. Patient/Guardian was advised if they have not already done so to contact the registration department to sign all necessary forms in order for us  to release information regarding their care.   Consent: Patient/Guardian gives verbal consent for treatment and assignment of benefits for services provided during this visit. Patient/Guardian expressed understanding and agreed to proceed.   This note was generated in part or whole with voice recognition software. Voice recognition is usually quite accurate but there are transcription errors that can and very often do occur. I apologize for any typographical errors that were not detected and corrected.    Brentt Fread, MD 04/21/2024, 7:57 PM

## 2024-04-20 ENCOUNTER — Ambulatory Visit (INDEPENDENT_AMBULATORY_CARE_PROVIDER_SITE_OTHER): Admitting: Adult Health

## 2024-04-20 VITALS — BP 136/96 | HR 95 | Wt 189.8 lb

## 2024-04-20 DIAGNOSIS — G43709 Chronic migraine without aura, not intractable, without status migrainosus: Secondary | ICD-10-CM | POA: Diagnosis not present

## 2024-04-20 MED ORDER — ZAVZPRET 10 MG/ACT NA SOLN: 10.0000 mg | Freq: Every day | NASAL | Status: AC | PRN

## 2024-04-20 MED ORDER — ZAVZPRET 10 MG/ACT NA SOLN: 10.0000 mg | NASAL | Status: DC | PRN

## 2024-04-20 NOTE — Patient Instructions (Signed)
 Your Plan:  Call if headache does not improve     Thank you for coming to see us  at [facility]. I hope we have been able to provide you high quality care today.  You may receive a patient satisfaction survey over the next few weeks. We would appreciate your feedback and comments so that we may continue to improve ourselves and the health of our patients.

## 2024-04-20 NOTE — Progress Notes (Addendum)
 04/20/24: Patient reports that typically Botox  works well for her.  This last week and a half she has had an ongoing migraine.  We did send in a Medrol  Dosepak which she does not feel is beneficial.  She has 2 days left of her steroid Dosepak.  She states that the migraine is behind the right eye.  It started last Tuesday.  She started having vertigo on Thursday.  She does have a history of vertigo and with severe headaches will get vertigo with her migraines.No aura. Denies numbness, tingling and weakness.  Reports photophobia and phonophobia.  Reports nausea but no vomiting.  She has had a Toradol  injection in the past but unsure how beneficial it has been.  She has tried Nurtec but has not resolved the headache.  She states that the headache severity has varied over the last week.  Mo states she has been able to manage but she did have to call out of work today.  She remains on Qulipta . she would like to proceed with Botox  today.  Has not had a yearly office visit with Dr. Ines.  Will get that scheduled today.  Will give her Zavzpret  sample to see if it is beneficial for recurrent headache.  1 spray in the nostril.  Only 1 spray in 24 hours.  She was given 2 samples single dosing.  Information provided on her AVS.  Advised to not take with Nurtec or Ubrelvy .  We did discuss Toradol  however she is on steroids which can increase her risk of bleeding.  May consider Toradol  if headache does not resolve.  Physical exam was relatively unremarkable.  Last MRI was in 2024 which was unremarkable.  Generalized: Well developed  Neurological examination  Mentation: Alert oriented to time, place, history taking. Follows all commands speech and language fluent Cranial nerve II-XII: Pupils were equal round reactive to light extraocular movements were full, visual field were full on confrontational test. Facial sensation and strength were normal. Uvula tongue midline. Head turning and shoulder shrug  were normal and  symmetric. Motor: The motor testing reveals 5 over 5 strength of all 4 extremities. Good symmetric motor tone is noted throughout.  Sensory: Sensory testing is intact to  soft touch, v on all 4 extremities. No evidence of extinction is noted.  Coordination: Cerebellar testing reveals good finger-nose-finger and heel-to-shin bilaterally.  Gait and station: Gait is normal. Tandem gait is mildly unsteady. Romberg is negative. No drift is seen.       01/20/24: Last Wednesday got a headache behind the right eye that has lingered. Reports that she has not been compliant with qulipta  and other meds. Nurtec typically works well.   10/27/23: Reports that she usually gets several days of breakthrough headaches before Botox .  Only had 2 days of headaches before her Botox  this time. Has not been taking qulipta   regularly. Nurtec works well to get rd of headaces.   07/29/23: Last two weeks have had headaches. Had one headache that lasted 9 days. Got a steriod pack but not sure it has helped. Still doing qulipta  daily. In the last 5-7 days doing nurtec daily.   05/06/23: Reports that she did start having breakthrough headaches 2 to 3 weeks before Botox  today.  She did try taking Nurtec several days in a row.  She also has Ubrelvy  for abortive therapy.  02/05/23: Botox  works well.  Also taking Qulipta  daily.  She has Nurtec and Ubrelvy  for abortive therapy.  She is advised not to  take these medications together.  She saw Dr. Ines in April for an office visit  10/30/22: Botox  continues to work well.  After the last injection she developed a mild headache with nausea and vomiting.  This lasted about 6 days and then resolved.  She never had the symptoms after any previous Botox .  Advised patient that I did not feel that this was related to her Botox  injections.  Also discussed with Dr. Buck who did not feel this would be related to her Botox  injections  08/04/22: Reports that Botox  works well except for the last 2  weeks if she starts to have breakthrough headaches.  She states that she typically has 2 mild headaches during the week that she can take Excedrin Migraine.  Has not had any severe headaches.  Tries to take Nurtec as a preventative medicine 2 weeks before her Botox  is due.  Will use Ubrelvy  as an abortive medication if needed  05/06/22: Botox  is working well.  She is reports that she has been having trouble with vertigo not associated with her migraines.  Will be start vestibular rehab  02/06/22: Tried nurtec daily for the last week but not sure it helped? Maybe knocked the edge off. Otherwise her headaches are controlled. Has a mild headache 1-2 times a week. Usually will try OTC medicine first then will use ubrelvy . Encouraged her to use the ubrelvy  first thing.  11/12/20: Patient states that Botox  is working well for her.  She states the week before her Botox  is due she tends to have more headaches.  She is using the Ubrelvy  and sometimes nurtrec. Advised that she could try taking nurtec daily 5 days before her cycle to try to prevent migraines.    BOTOX  PROCEDURE NOTE FOR MIGRAINE HEADACHE    Contraindications and precautions discussed with patient(above). Aseptic procedure was observed and patient tolerated procedure. Procedure performed by Duwaine Russell, NP  The condition has existed for more than 6 months, and pt does not have a diagnosis of ALS, Myasthenia Gravis or Lambert-Eaton Syndrome.  Risks and benefits of injections discussed and pt agrees to proceed with the procedure.  Written consent obtained  These injections are medically necessary. These injections do not cause sedations or hallucinations which the oral therapies may cause.  Indication/Diagnosis: chronic migraine BOTOX (G9414) injection was performed according to protocol by [employer]. 200 units of BOTOX  was dissolved into 4 cc NS.   NDC: [address]-98  Type of toxin: Botox   Botox - 200 units x 1 vial Lot:  [id number] Expiration: 07/2026 NDC: 9976-6078-97   Bacteriostatic 0.9% Sodium Chloride - 4 mL  Lot: [id number] Expiration: 06/2025 NDC: 9590-8033-97   Dx:G43.709         Description of procedure:  The patient was placed in a sitting position. The standard protocol was used for Botox  as follows, with 5 units of Botox  injected at each site:   -Procerus muscle, midline injection  -Corrugator muscle, bilateral injection  -Frontalis muscle, bilateral injection, with 2 sites each side, medial injection was performed in the upper one third of the frontalis muscle, in the region vertical from the medial inferior edge of the superior orbital rim. The lateral injection was again in the upper one third of the forehead vertically above the lateral limbus of the cornea, 1.5 cm lateral to the medial injection site.  -Temporalis muscle injection, 4 sites, bilaterally. The first injection was 3 cm above the tragus of the ear, second injection site was 1.5 cm to 3 cm up from the  first injection site in line with the tragus of the ear. The third injection site was 1.5-3 cm forward between the first 2 injection sites. The fourth injection site was 1.5 cm posterior to the second injection site.  -Occipitalis muscle injection, 3 sites, bilaterally. The first injection was done one half way between the occipital protuberance and the tip of the mastoid process behind the ear. The second injection site was done lateral and superior to the first, 1 fingerbreadth from the first injection. The third injection site was 1 fingerbreadth superiorly and medially from the first injection site.  -Cervical paraspinal muscle injection, 2 sites, bilateral knee first injection site was 1 cm from the midline of the cervical spine, 3 cm inferior to the lower border of the occipital protuberance. The second injection site was 1.5 cm superiorly and laterally to the first injection site.  -Trapezius muscle injection was performed at  3 sites, bilaterally. The first injection site was in the upper trapezius muscle halfway between the inflection point of the neck, and the acromion. The second injection site was one half way between the acromion and the first injection site. The third injection was done between the first injection site and the inflection point of the neck.   Will return for repeat injection in 3 months.   A 200 unitsof Botox  was used, 155 units were injected, the rest of the Botox  was wasted. The patient tolerated the procedure well, there were no complications of the above procedure.  Duwaine Russell, MSN, NP-C 04/20/2024, 2:52 PM [facility] [address] [place], [place] [address] [phone]

## 2024-04-21 ENCOUNTER — Encounter: Payer: Self-pay | Admitting: Adult Health

## 2024-04-21 DIAGNOSIS — R42 Dizziness and giddiness: Secondary | ICD-10-CM

## 2024-04-21 MED ORDER — ONABOTULINUMTOXINA 200 UNITS IJ SOLR
155.0000 [IU] | Freq: Once | INTRAMUSCULAR | Status: AC
Start: 2024-04-21 — End: 2024-04-20
  Administered 2024-04-20: 155 [IU] via INTRAMUSCULAR

## 2024-04-21 NOTE — Telephone Encounter (Signed)
 I called the patient.  She reports that she woke up this morning and had intense right shoulder pain.  She reports that the muscle tear was spasming.  She states that her husband states that her muscle was hard as a rock.  She states as the day goes on is gotten better but not completely resolved.  Denies numbness and tingling down the arm. She is using a heating pack and took a muscle relaxer.  She is not sure if she slept wrong.  I did advise the patient that this is not a typical side effect of Botox .  It also takes Botox  7 to 10 days to take effect.  She also confirms that she has never had the symptoms after having Botox  before.  She does state that she took Zavzpret  last night and her headache pain is better but not resolved.  Advised that she can try Zavzpret  again tomorrow. She is not sure if the vertigo has completely resolved as she has been laying down most of the day. If headache does not improve she should let me know.  I did advise that if her shoulder pain worsens or she has other symptoms like numbness and tingling down the arm or any other concerning symptoms she should go to urgent care or the closest ED.

## 2024-04-21 NOTE — Progress Notes (Signed)
 Botox - 200 units x 1 vial Lot: [id number] Expiration: 07/2026 NDC: 9976-6078-97  Bacteriostatic 0.9% Sodium Chloride - 4 mL  Lot: [id number] Expiration: 06/2025 NDC: 9590-8033-97  Dx:G43.709 B/B Witnessed by Particia PEAK

## 2024-04-23 NOTE — Telephone Encounter (Signed)
 Tried to call the patient. LVM.

## 2024-04-25 ENCOUNTER — Telehealth: Payer: Self-pay | Admitting: Adult Health

## 2024-04-25 NOTE — Telephone Encounter (Signed)
 Referral for Physical Therapy sent thru Epic to [facility] Phone: [phone]

## 2024-04-25 NOTE — Telephone Encounter (Signed)
 I called the patient.  She states that her headache pain has improved.  She also states that shoulder pain has improved.  Just still feels a little tender.  She states that she still is having some vertigo.  She plans to call PT where she works to see if she can go down for therapy if not she will ask for an order.  She did have an MRI back in May 2024 that was unremarkable.  Was having similar symptoms then.  Certainly if her symptoms do not improve we may consider repeat imaging.

## 2024-04-25 NOTE — Addendum Note (Signed)
 Addended by: SHERRYL DUWAINE SQUIBB on: 04/25/2024 03:45 PM   Modules accepted: Orders

## 2024-04-28 ENCOUNTER — Encounter: Payer: Self-pay | Admitting: Physical Therapy

## 2024-04-28 ENCOUNTER — Ambulatory Visit: Attending: Adult Health | Admitting: Physical Therapy

## 2024-04-28 ENCOUNTER — Other Ambulatory Visit: Payer: Self-pay

## 2024-04-28 DIAGNOSIS — R2681 Unsteadiness on feet: Secondary | ICD-10-CM | POA: Insufficient documentation

## 2024-04-28 DIAGNOSIS — R42 Dizziness and giddiness: Secondary | ICD-10-CM | POA: Diagnosis not present

## 2024-04-28 NOTE — Therapy (Signed)
 OUTPATIENT PHYSICAL THERAPY VESTIBULAR EVALUATION     Patient Name: Candice [name] MRN: [id number] DOB:[birth year], 38 y.o., female Today's Date: 04/28/2024  END OF SESSION:   PT End of Session - 04/28/24 1404     Visit Number 1    Number of Visits 24    Date for PT Re-Evaluation 07/21/24    PT Start Time 1404    PT Stop Time 1453    PT Time Calculation (min) 49 min    Activity Tolerance Patient tolerated treatment well    Behavior During Therapy WFL for tasks assessed/performed          Past Medical History:  Diagnosis Date   Anxiety    Depression    Hearing loss    Migraines    a. x 10+ years.   Postpartum care following cesarean delivery (5/29) 02/04/2014   Pre-syncope    a. 07/2015 [facility] ED visit, ? vasovagal, improved with IVF.   Short cervix in second trimester, antepartum 11/16/2013   Past Surgical History:  Procedure Laterality Date   CESAREAN SECTION N/A 02/03/2014   Procedure: Primary Cesarean Section Delivery Baby Girl @ 2244, Apgars 9/9;  Surgeon: Dickie DELENA Carder, MD;  Location: [facility];  Service: Obstetrics;  Laterality: N/A;   CESAREAN SECTION     WISDOM TOOTH EXTRACTION  2002   Patient Active Problem List   Diagnosis Date Noted   MDD (major depressive disorder), recurrent episode, mild (HCC) 10/30/2020   Insomnia due to mental condition 07/14/2019   Other specified anxiety disorders 05/13/2019   MDD (major depressive disorder), recurrent, in full remission (HCC) 04/08/2019   Panic attacks 04/08/2019   MDD (major depressive disorder), recurrent, in partial remission (HCC) 04/08/2019   Stress at home 12/19/2016   Muscle spasm 06/06/2016   Migraine without aura and with status migrainosus, not intractable 11/23/2015   Migraine without aura and without status migrainosus, not intractable 09/11/2015   Chronic migraine without aura without status migrainosus, not intractable 09/11/2015   Tachycardia 08/27/2015   Depression, major, recurrent,  moderate (HCC) 02/19/2015   Postpartum care following cesarean delivery (5/29) 02/04/2014   Indication for care in labor or delivery 02/03/2014   Short cervix in second trimester, antepartum 11/16/2013   Antepartum bleeding, second trimester 11/15/2013    PCP: Sherial Bail, MD REFERRING PROVIDER: Sherryl Bouchard, NP   REFERRING DIAG: R42 (ICD-10-CM) - Vertigo   THERAPY DIAG:  Dizziness and giddiness  Unsteadiness on feet  ONSET DATE: April 12, 2024  Rationale for Evaluation and Treatment: Rehabilitation  SUBJECTIVE:   SUBJECTIVE STATEMENT:  Pt states she has had migraines since she was 38y.o. and consistently sees a Neurologist to manage them. Reports she got hearing aides in early 30s and denies any recent changes to her hearing. States at some point she started having vertigo spells. States she has previously seen an ENT, Dr. Herminio, who told her she had BPPV and was treated with Epley maneuver (2-3x in the past few years). States she has also had PT 2x in the past to treat vestibular symptoms as well.  States sometimes it is not BPPV that is causing her symptoms, but may be her migraines.  Reports 2 Tuesdays ago (Aug 5th) she started having a migraine, and 2 weeks ago today (Aug 7th) is when the vertigo started. States she went to see Neurologist who prescribed a steroid pack because that has helped in the past, but it wasn't effective this time. Pt states she was able to  have her botox  injections for migraines on Aug 13th. States she was also prescribed a nasal spray (Zavzpret ) to help with her migraines, and reports her headache pain was starting to be relieved by this past Saturday (8/16). States the day after her botox  injections on Thursday 8/14 she could not lift herself up from the bed because she was having such bad muscle spasms in R shoulder; however, reports NP said it was likely not related to botox . Pt states she is still having some lingering tenderness in R  shoulder.   States she called her ENT when the vertigo started, but they would require a new referral for her to see them because it has been over a year.  States she still has her gaze stabilization exercises from last round of PT at this clinic.   States she is having increased staggering during gait. States as soon as she starts to move around, or especially while driving trying to look R/L the vertigo is intensified.   Pt states the only thing that alleviates her symptoms is sitting still, but any movement provokes her symptoms. Pt describes it as dizziness but almost states it feels like her head is heavy. Pt states she feels like she is having a feeling of LOB towards the RIGHT.  Pt states she had her labs checked in June 2025 with her PCP and the only significant findings were slightly low B12, but still WNL. Pt states she has had to previously do B12 injections.  States she has had MRIs - last May and they were normal (results posted below).  Pt states she is not wearing her hearing aides today.   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   Pt previously treated at this clinic in December of 2023 - March 2024  Pt accompanied by: self  PERTINENT HISTORY: Anxiety, hx of BPPV, depression, Migraines, Hearing Loss  PAIN:  Are you having pain? No states no pain, just the vertigo.   PRECAUTIONS: Fall  RED FLAGS: None   WEIGHT BEARING RESTRICTIONS: No  FALLS: Has patient fallen in last 6 months? No, but near falls where she was able to catch herself  LIVING ENVIRONMENT: Lives with: lives with their spouse and lives with their daughter Lives in: House/apartment Stairs: Yes: Internal: 14 steps; on right going up and External: 8 steps; bilateral but cannot reach both Has following equipment at home: None  PLOF: Independent, working full time as Sports coach at [employer]  PATIENT GOALS: get rid of the vertigo  OBJECTIVE:  Note: Objective measures were completed at  Evaluation unless otherwise noted.  DIAGNOSTIC FINDINGS:   STUDY DATE: 01/14/23  EXAM: MR BRAIN W WO CONTRAST  IMPRESSION: Normal MRI brain / IAC (with and without).   COGNITION: Overall cognitive status: Within functional limits for tasks assessed   SENSATION: Not formally assessed, would benefit from testing  EDEMA:  Not formally assessed  MUSCLE TONE:  Not formally assessed  DTRs:  Not formally assessed  POSTURE:  rounded shoulders and forward head  Cervical ROM:    Active  Grossly WFL A/PROM (deg) eval  Flexion   Extension   Right lateral flexion   Left lateral flexion   Right rotation   Left rotation   (Blank rows = not tested)  STRENGTH: Not formally assessed  LOWER EXTREMITY MMT:   MMT  Not formally assessed, but demonstrates sufficient functional strength during mobility tasks  Right eval Left eval  Hip flexion  Hip abduction    Hip adduction    Hip internal rotation    Hip external rotation    Knee flexion    Knee extension    Ankle dorsiflexion    Ankle plantarflexion    Ankle inversion    Ankle eversion    (Blank rows = not tested)  BED MOBILITY:  Sit to supine Modified independence Supine to sit Modified independence  TRANSFERS: Assistive device utilized: None  Sit to stand: Modified independence Stand to sit: Modified independence Chair to chair: Modified independence *benefits from use of armrests for stability and moves slow/guarded in order to maintain balance Floor: Not assessed  RAMP: Not assessed   CURB: Not assessed  GAIT: Gait pattern: minor postural instability, step through pattern, decreased arm swing- Right, decreased arm swing- Left, decreased step length- Right, and decreased step length- Left Distance walked: ~56ft in/out of clinic Assistive device utilized: None Level of assistance: Modified independence and SBA Comments: noticed pt with minor LOB when turning head to scan environment, but able to perform  appropriate balance strategy to maintain upright  FUNCTIONAL TESTS:  10 meter walk test: need to assess Functional gait assessment: need to assess MCTSIB: need to assess  PATIENT SURVEYS:  DHI: THE DIZZINESS HANDICAP INVENTORY (DHI)  P1. Does looking up increase your problem? 4 = Yes  E2. Because of your problem, do you feel frustrated? 4 = Yes  F3. Because of your problem, do you restrict your travel for business or recreation?  4 = Yes  P4. Does walking down the aisle of a supermarket increase your problems?  4 = Yes  F5. Because of your problem, do you have difficulty getting into or out of bed?  0 = No  F6. Does your problem significantly restrict your participation in social activities, such as going out to dinner, going to the movies, dancing, or going to parties? 0 = No  F7. Because of your problem, do you have difficulty reading?  0 = No  P8. Does performing more ambitious activities such as sports, dancing, household chores (sweeping or putting dishes away) increase your problems?  4 = Yes  E9. Because of your problem, are you afraid to leave your home without having without having someone accompany you?  4 = Yes  E10. Because of your problem have you been embarrassed in front of others?  4 = Yes  P11. Do quick movements of your head increase your problem?  4 = Yes  F12. Because of your problem, do you avoid heights?  0 = No  P13. Does turning over in bed increase your problem?  4 = Yes  F14. Because of your problem, is it difficult for you to do strenuous homework or yard work? 4 = Yes  E15. Because of your problem, are you afraid people may think you are intoxicated? 4 = Yes  F16. Because of your problem, is it difficult for you to go for a walk by yourself?  4 = Yes  P17. Does walking down a sidewalk increase your problem?  4 = Yes  E18.Because of your problem, is it difficult for you to concentrate 4 = Yes  F19. Because of your problem, is it difficult for you to walk around  your house in the dark? 4 = Yes  E20. Because of your problem, are you afraid to stay home alone?  0 = No  E21. Because of your problem, do you feel handicapped? 4 = Yes  E22. Has the  problem placed stress on your relationships with members of your family or friends? 0 = No  E23. Because of your problem, are you depressed?  0 = No  F24. Does your problem interfere with your job or household responsibilities?  4 = Yes  P25. Does bending over increase your problem?  4 = Yes  TOTAL 72 - indicating severe handicap    DHI Scoring Instructions  The patient is asked to answer each question as it pertains to dizziness or unsteadiness problems, specifically  considering their condition during the last month. Questions are designed to incorporate functional (F), physical  (P), and emotional (E) impacts on disability.   Scores greater than 10 points should be referred to balance specialists for further evaluation.   16-34 Points (mild handicap)  36-52 Points (moderate handicap)  54+ Points (severe handicap)  Minimally Detectable Change: 17 points ([address] [place], 1990)  [place], G. SHAUNNA. and [place], C. W. (1990). The development of the Dizziness Handicap Inventory. Archives of Otolaryngology - Head and Neck Surgery 116(4): [id number].   VESTIBULAR ASSESSMENT:   SYMPTOM BEHAVIOR:  Subjective history: see above  Non-Vestibular symptoms: reports with her migraines she will have black floaters and fuzzy vision (but this it not new), baseline tinnitus, has new onset R neck pain following botox  injections, has hx of light sensivity with migraines New Non-vestibular symptoms: nausea/vomiting (with dry heaving) Denies changes in: hearing, vision, diplopia   Type of dizziness: Imbalance (Disequilibrium), Unsteady with head/body turns, Lightheadedness/Faint, and heavy feeling in her head  Frequency: daily/all day, unless she is sitting still/not moving  Duration: present entire time she is  moving  Aggravating factors: Induced by motion: looking up at the ceiling, bending down to the ground, turning body quickly, turning head quickly, driving, sitting in a moving car, and activity in general; states when she rolls from her R side onto her L side she has a whoosh and a warm sensation (but doesn't feel this when rolling back to R side)  Relieving factors: head stationary and rest  Progression of symptoms: unchanged since onset  OCULOMOTOR EXAM:  Ocular Alignment: normal  Ocular ROM: No Limitations  Spontaneous Nystagmus: absent  Gaze-Induced Nystagmus: absent  Smooth Pursuits: intact  Saccades: intact  Convergence/Divergence: 8 cm, smooth eye movements during  VESTIBULAR - OCULAR REFLEX:   Slow VOR: Normal  VOR Cancellation: Normal  Head-Impulse Test: HIT Right: negative HIT Left: negative  Dynamic Visual Acuity: Not able to be assessed   POSITIONAL TESTING:  Right Dix-Hallpike: no nystagmus and denies symptoms Left Dix-Hallpike: no nystagmus, but symptomatic when sitting up from testing position Right Roll Test: no nystagmus Left Roll Test: no nystagmus; pt reports some symptoms while lying in this position, stating it's always worse with the left  MOTION SENSITIVITY:  Motion Sensitivity Quotient Intensity: 0 = none, 1 = Lightheaded, 2 = Mild, 3 = Moderate, 4 = Severe, 5 = Vomiting  Intensity  1. Sitting to supine   2. Supine to L side   3. Supine to R side   4. Supine to sitting   5. L Hallpike-Dix   6. Up from L    7. R Hallpike-Dix   8. Up from R    9. Sitting, head tipped to L knee   10. Head up from L knee   11. Sitting, head tipped to R knee   12. Head up from R knee   13. Sitting head turns x5   14.Sitting head nods x5  15. In stance, 180 turn to L    16. In stance, 180 turn to R     OTHOSTATICS: need to assess                                                                                                                             TREATMENT DATE: 04/28/2024  Education on vestibular migraine and findings of assessment not indicating peripheral BPPV involvement at this time   PATIENT EDUCATION: Education details: PT POC, vestibular testing and findings, vestibular migraines, recommendation for follow-up therapy to perform additional assessments below Person educated: Patient Education method: Explanation Education comprehension: verbalized understanding and needs further education  HOME EXERCISE PROGRAM:  Need to initiate    GOALS: Goals reviewed with patient? Yes  SHORT TERM GOALS: Target date: 06/09/2024  Patient will be independent in home exercise program to improve gaze stabilization and/or mobility for improved functional independence with ADLs while experiencing decreased dizziness symptoms.  Baseline: need to initiate Goal status: INITIAL  LONG TERM GOALS: Target date: 07/21/2024    Patient will reduce dizziness handicap inventory [facility]) score to <36, for less dizziness with ADLs and increased safety with home and work tasks.  Baseline: 72 Goal status: INITIAL  2.  Patient will increase Functional Gait Assessment (FGA) score to >20/30 as to reduce fall risk and improve dynamic gait safety with community ambulation. Baseline: need to assess Goal status: INITIAL  3.  Patient will report experiencing dizziness symptoms during daily functional mobility activities in <4 out of 7 days over the past week.  Baseline: experiencing dizziness daily Goal status: INITIAL   ASSESSMENT:  CLINICAL IMPRESSION: Patient is a 38 y.o. female who was seen today for physical therapy evaluation and treatment for acute onset dizziness that started on 8/7, which was 2 days following onset of a migraine. Patient reports she experiences dizziness symptoms during all activity/movements and the only time it calms down is if she sits stationary for a while. Patient reports she has a heavy feeling in her head associated  with the dizziness as well as a tendency to have feeling of LOB towards the R. Patient denies any changes to her hearing and vision associated with the dizziness. Patient's oculomotor exam is all WNL, ecxept for 8cm convergence, and her VOR testing was all WNL. Performed positional vestibular testing with it also being negative, but pt does have symptoms during the movements of returning to upright and lying with her L ear in dependent position. Patient states at home when she rolls onto her L side she gets a whoosh and warm sensation. Patient educated on plan to perform further testing with use of vestibular goggles to prevent gaze fixation as well as motion sensitivity testing to prescribe habituation exercises. The pt will benefit from further skilled PT to improve positional and movement evoked dizziness and balance in order to return pt to PLOF, decrease fall risk, and increase QOL.   OBJECTIVE IMPAIRMENTS: Abnormal gait,  decreased activity tolerance, decreased balance, decreased mobility, difficulty walking, and dizziness.   ACTIVITY LIMITATIONS: carrying, lifting, bending, standing, squatting, sleeping, stairs, transfers, bed mobility, bathing, toileting, dressing, reach over head, hygiene/grooming, locomotion level, and caring for others  PARTICIPATION LIMITATIONS: meal prep, cleaning, laundry, driving, shopping, community activity, occupation, and yard work  PERSONAL FACTORS: Age, Past/current experiences, Sex, Time since onset of injury/illness/exacerbation, and 3+ comorbidities: Anxiety, hx of BPPV, depression, Migraines, Hearing Loss are also affecting patient's functional outcome.   REHAB POTENTIAL: Good  CLINICAL DECISION MAKING: Evolving/moderate complexity  EVALUATION COMPLEXITY: Moderate   PLAN:  PT FREQUENCY: 1-2x/week  PT DURATION: 12 weeks  PLANNED INTERVENTIONS: 97164- PT Re-evaluation, 97750- Physical Performance Testing, 97110-Therapeutic exercises, 97530-  Therapeutic activity, [id number]- Neuromuscular re-education, 97535- Self Care, [id number]- Manual therapy, [id number]- Gait training, [phone]- Canalith repositioning, [phone] (1-2 muscles), [id number] (3+ muscles)- Dry Needling, Patient/Family education, Balance training, Stair training, Joint mobilization, Spinal mobilization, Vestibular training, Visual/preceptual remediation/compensation, Cryotherapy, Moist heat, and Biofeedback  PLAN FOR NEXT SESSION:  - motion sensitivity quotient  - prescribe HEP based on these findings - repeat testing with vestibular goggles - assess sensation - orthostatic vital signs - 10 meter walk test - Functional gait assessment - MCTSIB - initiate HEP   Candice [name], PT, DPT, NCS, CSRS Physical Therapist - [place]  [facility]  5:58 PM 04/28/24

## 2024-05-02 ENCOUNTER — Other Ambulatory Visit: Payer: Self-pay

## 2024-05-10 ENCOUNTER — Other Ambulatory Visit: Payer: Self-pay

## 2024-05-10 ENCOUNTER — Other Ambulatory Visit: Payer: Self-pay | Admitting: Adult Health

## 2024-05-11 ENCOUNTER — Other Ambulatory Visit: Payer: Self-pay

## 2024-05-12 ENCOUNTER — Other Ambulatory Visit: Payer: Self-pay

## 2024-05-12 MED FILL — Ondansetron Orally Disintegrating Tab 8 MG: ORAL | 7 days supply | Qty: 20 | Fill #0 | Status: AC

## 2024-05-23 ENCOUNTER — Other Ambulatory Visit: Payer: Self-pay

## 2024-05-23 DIAGNOSIS — G43009 Migraine without aura, not intractable, without status migrainosus: Secondary | ICD-10-CM | POA: Diagnosis not present

## 2024-05-23 DIAGNOSIS — R42 Dizziness and giddiness: Secondary | ICD-10-CM | POA: Diagnosis not present

## 2024-05-23 DIAGNOSIS — G4733 Obstructive sleep apnea (adult) (pediatric): Secondary | ICD-10-CM | POA: Diagnosis not present

## 2024-05-23 DIAGNOSIS — G43119 Migraine with aura, intractable, without status migrainosus: Secondary | ICD-10-CM | POA: Diagnosis not present

## 2024-05-23 MED ORDER — WEGOVY 0.25 MG/0.5ML ~~LOC~~ SOAJ
0.2500 mg | SUBCUTANEOUS | 1 refills | Status: DC
  Filled 2024-05-23 – 2024-05-27 (×2): qty 2, 28d supply, fill #0

## 2024-05-23 MED ORDER — SCOPOLAMINE 1 MG/3DAYS TD PT72
1.0000 | MEDICATED_PATCH | TRANSDERMAL | 0 refills | Status: AC
  Filled 2024-05-23: qty 10, 30d supply, fill #0

## 2024-05-23 MED FILL — Venlafaxine HCl Cap ER 24HR 75 MG (Base Equivalent): ORAL | 90 days supply | Qty: 90 | Fill #0 | Status: AC

## 2024-05-24 ENCOUNTER — Ambulatory Visit: Admitting: Physical Therapy

## 2024-05-24 ENCOUNTER — Other Ambulatory Visit: Payer: Self-pay

## 2024-05-24 NOTE — Therapy (Incomplete)
 OUTPATIENT PHYSICAL THERAPY VESTIBULAR TREATMENT     Patient Name: Candice [name] MRN: [id number] DOB:[birth year], 38 y.o., female Today's Date: 05/24/2024  END OF SESSION: ***    Past Medical History:  Diagnosis Date   Anxiety    Depression    Hearing loss    Migraines    a. x 10+ years.   Postpartum care following cesarean delivery (5/29) 02/04/2014   Pre-syncope    a. 07/2015 [facility] ED visit, ? vasovagal, improved with IVF.   Short cervix in second trimester, antepartum 11/16/2013   Past Surgical History:  Procedure Laterality Date   CESAREAN SECTION N/A 02/03/2014   Procedure: Primary Cesarean Section Delivery Baby Girl @ 2244, Apgars 9/9;  Surgeon: Dickie DELENA Carder, MD;  Location: [facility];  Service: Obstetrics;  Laterality: N/A;   CESAREAN SECTION     WISDOM TOOTH EXTRACTION  2002   Patient Active Problem List   Diagnosis Date Noted   MDD (major depressive disorder), recurrent episode, mild (HCC) 10/30/2020   Insomnia due to mental condition 07/14/2019   Other specified anxiety disorders 05/13/2019   MDD (major depressive disorder), recurrent, in full remission (HCC) 04/08/2019   Panic attacks 04/08/2019   MDD (major depressive disorder), recurrent, in partial remission (HCC) 04/08/2019   Stress at home 12/19/2016   Muscle spasm 06/06/2016   Migraine without aura and with status migrainosus, not intractable 11/23/2015   Migraine without aura and without status migrainosus, not intractable 09/11/2015   Chronic migraine without aura without status migrainosus, not intractable 09/11/2015   Tachycardia 08/27/2015   Depression, major, recurrent, moderate (HCC) 02/19/2015   Postpartum care following cesarean delivery (5/29) 02/04/2014   Indication for care in labor or delivery 02/03/2014   Short cervix in second trimester, antepartum 11/16/2013   Antepartum bleeding, second trimester 11/15/2013    PCP: Sherial Bail, MD REFERRING PROVIDER: Sherryl Bouchard,  NP   REFERRING DIAG: R42 (ICD-10-CM) - Vertigo   THERAPY DIAG: *** No diagnosis found.  ONSET DATE: April 12, 2024  Rationale for Evaluation and Treatment: Rehabilitation  SUBJECTIVE:   SUBJECTIVE STATEMENT:  ***  From Initial Eval: Pt states she has had migraines since she was 38y.o. and consistently sees a Neurologist to manage them. Reports she got hearing aides in early 30s and denies any recent changes to her hearing. States at some point she started having vertigo spells. States she has previously seen an ENT, Dr. Herminio, who told her she had BPPV and was treated with Epley maneuver (2-3x in the past few years). States she has also had PT 2x in the past to treat vestibular symptoms as well.  States sometimes it is not BPPV that is causing her symptoms, but may be her migraines.  Reports 2 Tuesdays ago (Aug 5th) she started having a migraine, and 2 weeks ago today (Aug 7th) is when the vertigo started. States she went to see Neurologist who prescribed a steroid pack because that has helped in the past, but it wasn't effective this time. Pt states she was able to have her botox  injections for migraines on Aug 13th. States she was also prescribed a nasal spray (Zavzpret ) to help with her migraines, and reports her headache pain was starting to be relieved by this past Saturday (8/16). States the day after her botox  injections on Thursday 8/14 she could not lift herself up from the bed because she was having such bad muscle spasms in R shoulder; however, reports NP said it was likely not related  to botox . Pt states she is still having some lingering tenderness in R shoulder.   States she called her ENT when the vertigo started, but they would require a new referral for her to see them because it has been over a year.  States she still has her gaze stabilization exercises from last round of PT at this clinic.   States she is having increased staggering during gait. States as soon as  she starts to move around, or especially while driving trying to look R/L the vertigo is intensified.   Pt states the only thing that alleviates her symptoms is sitting still, but any movement provokes her symptoms. Pt describes it as dizziness but almost states it feels like her head is heavy. Pt states she feels like she is having a feeling of LOB towards the RIGHT.  Pt states she had her labs checked in June 2025 with her PCP and the only significant findings were slightly low B12, but still WNL. Pt states she has had to previously do B12 injections.  States she has had MRIs - last May and they were normal (results posted below).  Pt states she is not wearing her hearing aides today.   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   Pt previously treated at this clinic in December of 2023 - March 2024  Pt accompanied by: self  PERTINENT HISTORY: Anxiety, hx of BPPV, depression, Migraines, Hearing Loss  PAIN:  Are you having pain? No states no pain, just the vertigo.   PRECAUTIONS: Fall  RED FLAGS: None   WEIGHT BEARING RESTRICTIONS: No  FALLS: Has patient fallen in last 6 months? No, but near falls where she was able to catch herself  LIVING ENVIRONMENT: Lives with: lives with their spouse and lives with their daughter Lives in: House/apartment Stairs: Yes: Internal: 14 steps; on right going up and External: 8 steps; bilateral but cannot reach both Has following equipment at home: None  PLOF: Independent, working full time as Sports coach at [employer]  PATIENT GOALS: get rid of the vertigo  OBJECTIVE:  Note: Objective measures were completed at Evaluation unless otherwise noted.  DIAGNOSTIC FINDINGS:   STUDY DATE: 01/14/23  EXAM: MR BRAIN W WO CONTRAST  IMPRESSION: Normal MRI brain / IAC (with and without).   COGNITION: Overall cognitive status: Within functional limits for tasks assessed   SENSATION: Not formally assessed, would benefit from testing  EDEMA:  Not  formally assessed  MUSCLE TONE:  Not formally assessed  DTRs:  Not formally assessed  POSTURE:  rounded shoulders and forward head  Cervical ROM:    Active  Grossly WFL A/PROM (deg) eval  Flexion   Extension   Right lateral flexion   Left lateral flexion   Right rotation   Left rotation   (Blank rows = not tested)  STRENGTH: Not formally assessed  LOWER EXTREMITY MMT:   MMT  Not formally assessed, but demonstrates sufficient functional strength during mobility tasks  Right eval Left eval  Hip flexion    Hip abduction    Hip adduction    Hip internal rotation    Hip external rotation    Knee flexion    Knee extension    Ankle dorsiflexion    Ankle plantarflexion    Ankle inversion    Ankle eversion    (Blank rows = not tested)  BED MOBILITY:  Sit to supine Modified independence Supine to sit Modified independence  TRANSFERS: Assistive device utilized: None  Sit to stand: Modified independence Stand to sit: Modified independence Chair to chair: Modified independence *benefits from use of armrests for stability and moves slow/guarded in order to maintain balance Floor: Not assessed  RAMP: Not assessed   CURB: Not assessed  GAIT: Gait pattern: minor postural instability, step through pattern, decreased arm swing- Right, decreased arm swing- Left, decreased step length- Right, and decreased step length- Left Distance walked: ~1ft in/out of clinic Assistive device utilized: None Level of assistance: Modified independence and SBA Comments: noticed pt with minor LOB when turning head to scan environment, but able to perform appropriate balance strategy to maintain upright  FUNCTIONAL TESTS:  10 meter walk test: need to assess Functional gait assessment: need to assess MCTSIB: need to assess  PATIENT SURVEYS:  DHI: THE DIZZINESS HANDICAP INVENTORY (DHI)  P1. Does looking up increase your problem? 4 = Yes  E2. Because of your problem, do you feel  frustrated? 4 = Yes  F3. Because of your problem, do you restrict your travel for business or recreation?  4 = Yes  P4. Does walking down the aisle of a supermarket increase your problems?  4 = Yes  F5. Because of your problem, do you have difficulty getting into or out of bed?  0 = No  F6. Does your problem significantly restrict your participation in social activities, such as going out to dinner, going to the movies, dancing, or going to parties? 0 = No  F7. Because of your problem, do you have difficulty reading?  0 = No  P8. Does performing more ambitious activities such as sports, dancing, household chores (sweeping or putting dishes away) increase your problems?  4 = Yes  E9. Because of your problem, are you afraid to leave your home without having without having someone accompany you?  4 = Yes  E10. Because of your problem have you been embarrassed in front of others?  4 = Yes  P11. Do quick movements of your head increase your problem?  4 = Yes  F12. Because of your problem, do you avoid heights?  0 = No  P13. Does turning over in bed increase your problem?  4 = Yes  F14. Because of your problem, is it difficult for you to do strenuous homework or yard work? 4 = Yes  E15. Because of your problem, are you afraid people may think you are intoxicated? 4 = Yes  F16. Because of your problem, is it difficult for you to go for a walk by yourself?  4 = Yes  P17. Does walking down a sidewalk increase your problem?  4 = Yes  E18.Because of your problem, is it difficult for you to concentrate 4 = Yes  F19. Because of your problem, is it difficult for you to walk around your house in the dark? 4 = Yes  E20. Because of your problem, are you afraid to stay home alone?  0 = No  E21. Because of your problem, do you feel handicapped? 4 = Yes  E22. Has the problem placed stress on your relationships with members of your family or friends? 0 = No  E23. Because of your problem, are you depressed?  0 = No   F24. Does your problem interfere with your job or household responsibilities?  4 = Yes  P25. Does bending over increase your problem?  4 = Yes  TOTAL 72 - indicating severe handicap    DHI Scoring Instructions  The patient is asked to answer each  question as it pertains to dizziness or unsteadiness problems, specifically  considering their condition during the last month. Questions are designed to incorporate functional (F), physical  (P), and emotional (E) impacts on disability.   Scores greater than 10 points should be referred to balance specialists for further evaluation.   16-34 Points (mild handicap)  36-52 Points (moderate handicap)  54+ Points (severe handicap)  Minimally Detectable Change: 17 points ([address] [place], 1990)  [place], G. SHAUNNA. and [place], C. W. (1990). The development of the Dizziness Handicap Inventory. Archives of Otolaryngology - Head and Neck Surgery 116(4): [id number].   VESTIBULAR ASSESSMENT:   SYMPTOM BEHAVIOR:  Subjective history: see above  Non-Vestibular symptoms: reports with her migraines she will have black floaters and fuzzy vision (but this it not new), baseline tinnitus, has new onset R neck pain following botox  injections, has hx of light sensivity with migraines New Non-vestibular symptoms: nausea/vomiting (with dry heaving) Denies changes in: hearing, vision, diplopia   Type of dizziness: Imbalance (Disequilibrium), Unsteady with head/body turns, Lightheadedness/Faint, and heavy feeling in her head  Frequency: daily/all day, unless she is sitting still/not moving  Duration: present entire time she is moving  Aggravating factors: Induced by motion: looking up at the ceiling, bending down to the ground, turning body quickly, turning head quickly, driving, sitting in a moving car, and activity in general; states when she rolls from her R side onto her L side she has a whoosh and a warm sensation (but doesn't feel this when rolling back  to R side)  Relieving factors: head stationary and rest  Progression of symptoms: unchanged since onset  OCULOMOTOR EXAM:  Ocular Alignment: normal  Ocular ROM: No Limitations  Spontaneous Nystagmus: absent  Gaze-Induced Nystagmus: absent  Smooth Pursuits: intact  Saccades: intact  Convergence/Divergence: 8 cm, smooth eye movements during  VESTIBULAR - OCULAR REFLEX:   Slow VOR: Normal  VOR Cancellation: Normal  Head-Impulse Test: HIT Right: negative HIT Left: negative  Dynamic Visual Acuity: Not able to be assessed   POSITIONAL TESTING:  Right Dix-Hallpike: no nystagmus and denies symptoms Left Dix-Hallpike: no nystagmus, but symptomatic when sitting up from testing position Right Roll Test: no nystagmus Left Roll Test: no nystagmus; pt reports some symptoms while lying in this position, stating it's always worse with the left  MOTION SENSITIVITY:  ***   OTHOSTATICS: need to assess                                                                                                                            TREATMENT DATE: 05/24/2024  ***  Repeat positional testing with goggles? Motion sensitivity quotient - prescribe habituation from  Education on vestibular migraine and findings of assessment not indicating peripheral BPPV involvement at this time  ***   Modified Motion Sensitivity Test  Movement Intensity  (change from baseline, 0-10, no change=0, severe change=10) Duration  <5 sec = 0  5-10 s = 1 11-20 s = 2  21-30 s = 3 >30 s = 4 Score (Intensity + Duration)  5x Horizontal head turns     5x Vertical head turns     5x Right diagonal head turns (upper left quadrant down to right)     5x Left diagonal head turns (upper right quadrant down to left)     5x Trunk Bends (bending knees reaching to floor)     5x Right quarter body turns (look over right shoulder with trunk rotation, feet planted)     5x Left quarter body turns (look over left shoulder with trunk  rotation, feet planted)     1x 360 degree turn to right     1x 360 degree turn to left     5x VOR cancellation (follow thumbs horizontally with head/trunk rotation x45 degrees each way)     TOTAL SCORE      MSQ = total score x (# of positions)/14   0-10 mild range 11-30 moderate range 31-100 severe range      ***   PATIENT EDUCATION: Education details: PT POC, vestibular testing and findings, vestibular migraines, recommendation for follow-up therapy to perform additional assessments below Person educated: Patient Education method: Explanation Education comprehension: verbalized understanding and needs further education  HOME EXERCISE PROGRAM:  Need to initiate  ***   GOALS: Goals reviewed with patient? Yes  SHORT TERM GOALS: Target date: 06/09/2024  Patient will be independent in home exercise program to improve gaze stabilization and/or mobility for improved functional independence with ADLs while experiencing decreased dizziness symptoms.  Baseline: need to initiate Goal status: INITIAL  LONG TERM GOALS: Target date: 07/21/2024    Patient will reduce dizziness handicap inventory [facility]) score to <36, for less dizziness with ADLs and increased safety with home and work tasks.  Baseline: 72 Goal status: INITIAL  2.  Patient will increase Functional Gait Assessment (FGA) score to >20/30 as to reduce fall risk and improve dynamic gait safety with community ambulation. Baseline: need to assess *** Goal status: INITIAL  3.  Patient will report experiencing dizziness symptoms during daily functional mobility activities in <4 out of 7 days over the past week.  Baseline: experiencing dizziness daily Goal status: INITIAL   ASSESSMENT:  CLINICAL IMPRESSION: *** Patient is a 38 y.o. female who was seen today for physical therapy evaluation and treatment for acute onset dizziness that started on 8/7, which was 2 days following onset of a migraine. Patient reports she  experiences dizziness symptoms during all activity/movements and the only time it calms down is if she sits stationary for a while. Patient reports she has a heavy feeling in her head associated with the dizziness as well as a tendency to have feeling of LOB towards the R. Patient denies any changes to her hearing and vision associated with the dizziness. Patient's oculomotor exam is all WNL, ecxept for 8cm convergence, and her VOR testing was all WNL. Performed positional vestibular testing with it also being negative, but pt does have symptoms during the movements of returning to upright and lying with her L ear in dependent position. Patient states at home when she rolls onto her L side she gets a whoosh and warm sensation. Patient educated on plan to perform further testing with use of vestibular goggles to prevent gaze fixation as well as motion sensitivity testing to prescribe habituation exercises. The pt will benefit from further skilled PT to improve positional and movement evoked dizziness and balance in order to return pt to PLOF, decrease  fall risk, and increase QOL.   OBJECTIVE IMPAIRMENTS: Abnormal gait, decreased activity tolerance, decreased balance, decreased mobility, difficulty walking, and dizziness.   ACTIVITY LIMITATIONS: carrying, lifting, bending, standing, squatting, sleeping, stairs, transfers, bed mobility, bathing, toileting, dressing, reach over head, hygiene/grooming, locomotion level, and caring for others  PARTICIPATION LIMITATIONS: meal prep, cleaning, laundry, driving, shopping, community activity, occupation, and yard work  PERSONAL FACTORS: Age, Past/current experiences, Sex, Time since onset of injury/illness/exacerbation, and 3+ comorbidities: Anxiety, hx of BPPV, depression, Migraines, Hearing Loss are also affecting patient's functional outcome.   REHAB POTENTIAL: Good  CLINICAL DECISION MAKING: Evolving/moderate complexity  EVALUATION COMPLEXITY:  Moderate   PLAN:  PT FREQUENCY: 1-2x/week  PT DURATION: 12 weeks  PLANNED INTERVENTIONS: 97164- PT Re-evaluation, 97750- Physical Performance Testing, 97110-Therapeutic exercises, 97530- Therapeutic activity, [id number]- Neuromuscular re-education, 97535- Self Care, [id number]- Manual therapy, [id number]- Gait training, [phone]- Canalith repositioning, [phone] (1-2 muscles), [id number] (3+ muscles)- Dry Needling, Patient/Family education, Balance training, Stair training, Joint mobilization, Spinal mobilization, Vestibular training, Visual/preceptual remediation/compensation, Cryotherapy, Moist heat, and Biofeedback  PLAN FOR NEXT SESSION: *** - motion sensitivity quotient  - prescribe HEP based on these findings - repeat testing with vestibular goggles - assess sensation - orthostatic vital signs - 10 meter walk test - Functional gait assessment - MCTSIB - initiate HEP   Kelilah Hebard, PT, DPT, NCS, CSRS Physical Therapist - [place]  [facility]  7:40 AM 05/24/24

## 2024-05-27 ENCOUNTER — Other Ambulatory Visit: Payer: Self-pay

## 2024-05-30 ENCOUNTER — Other Ambulatory Visit (HOSPITAL_COMMUNITY): Payer: Self-pay

## 2024-05-30 ENCOUNTER — Telehealth: Payer: Self-pay

## 2024-05-30 NOTE — Telephone Encounter (Signed)
 Pharmacy Patient Advocate Encounter   Received notification from CoverMyMeds that prior authorization for Ubrelvy  is required/requested.   Insurance verification completed.   The patient is insured through [facility] .   Per test claim: PA required; PA submitted to above mentioned insurance via Latent Key/confirmation #/EOC [facility] Status is pending

## 2024-05-31 NOTE — Telephone Encounter (Signed)
 Pharmacy Patient Advocate Encounter  Received notification from [facility] that Prior Authorization for Ubrelvy  has been APPROVED from 05/30/2024 to 05/29/2025   PA #/Case ID/Reference #: [id number]

## 2024-06-12 ENCOUNTER — Other Ambulatory Visit: Payer: Self-pay

## 2024-06-12 ENCOUNTER — Other Ambulatory Visit: Payer: Self-pay | Admitting: Adult Health

## 2024-06-12 MED FILL — Ondansetron Orally Disintegrating Tab 8 MG: ORAL | 7 days supply | Qty: 20 | Fill #1 | Status: AC

## 2024-06-13 ENCOUNTER — Other Ambulatory Visit: Payer: Self-pay

## 2024-06-14 ENCOUNTER — Other Ambulatory Visit: Payer: Self-pay

## 2024-06-14 MED ORDER — NURTEC 75 MG PO TBDP
1.0000 | ORAL_TABLET | Freq: Every day | ORAL | 3 refills | Status: AC | PRN
  Filled 2024-06-14: qty 8, 15d supply, fill #0
  Filled 2024-10-06: qty 16, 30d supply, fill #0

## 2024-06-15 ENCOUNTER — Other Ambulatory Visit: Payer: Self-pay

## 2024-06-15 ENCOUNTER — Other Ambulatory Visit: Payer: Self-pay | Admitting: Neurology

## 2024-06-15 DIAGNOSIS — G43009 Migraine without aura, not intractable, without status migrainosus: Secondary | ICD-10-CM

## 2024-06-15 MED ORDER — QULIPTA 60 MG PO TABS
60.0000 mg | ORAL_TABLET | Freq: Every day | ORAL | 5 refills | Status: AC
  Filled 2024-06-15 – 2024-07-14 (×2): qty 30, 30d supply, fill #0
  Filled 2024-08-10: qty 30, 30d supply, fill #1
  Filled 2024-10-06: qty 30, 30d supply, fill #2

## 2024-06-15 MED ORDER — PROPRANOLOL HCL ER 60 MG PO CP24
60.0000 mg | ORAL_CAPSULE | Freq: Every day | ORAL | 5 refills | Status: AC
  Filled 2024-06-15 – 2024-07-14 (×2): qty 30, 30d supply, fill #0
  Filled 2024-08-10: qty 30, 30d supply, fill #1
  Filled 2024-10-06: qty 30, 30d supply, fill #2

## 2024-06-22 ENCOUNTER — Telehealth: Payer: Self-pay | Admitting: Adult Health

## 2024-06-22 DIAGNOSIS — G43709 Chronic migraine without aura, not intractable, without status migrainosus: Secondary | ICD-10-CM

## 2024-06-22 NOTE — Telephone Encounter (Signed)
 Submitted auth request via CMM, status is pending. Key: [id number]

## 2024-06-24 ENCOUNTER — Other Ambulatory Visit: Payer: Self-pay

## 2024-06-27 ENCOUNTER — Telehealth (INDEPENDENT_AMBULATORY_CARE_PROVIDER_SITE_OTHER): Admitting: Psychiatry

## 2024-06-27 ENCOUNTER — Other Ambulatory Visit: Payer: Self-pay

## 2024-06-27 ENCOUNTER — Encounter: Payer: Self-pay | Admitting: Psychiatry

## 2024-06-27 DIAGNOSIS — R52 Pain, unspecified: Secondary | ICD-10-CM | POA: Diagnosis not present

## 2024-06-27 DIAGNOSIS — F3342 Major depressive disorder, recurrent, in full remission: Secondary | ICD-10-CM

## 2024-06-27 DIAGNOSIS — F418 Other specified anxiety disorders: Secondary | ICD-10-CM

## 2024-06-27 DIAGNOSIS — F5105 Insomnia due to other mental disorder: Secondary | ICD-10-CM | POA: Diagnosis not present

## 2024-06-27 MED ORDER — BUPROPION HCL ER (SR) 150 MG PO TB12
150.0000 mg | ORAL_TABLET | Freq: Two times a day (BID) | ORAL | 1 refills | Status: AC
  Filled 2024-06-27: qty 180, 90d supply, fill #0
  Filled 2024-10-06: qty 180, 90d supply, fill #1

## 2024-06-27 MED ORDER — VENLAFAXINE HCL ER 37.5 MG PO CP24
37.5000 mg | ORAL_CAPSULE | Freq: Every day | ORAL | 1 refills | Status: AC
  Filled 2024-06-27: qty 90, 90d supply, fill #0
  Filled 2024-10-06: qty 90, 90d supply, fill #1

## 2024-06-27 NOTE — Progress Notes (Signed)
 Virtual Visit via Video Note  I connected with Candice [name] on 06/27/24 at  4:00 PM EDT by a video enabled telemedicine application and verified that I am speaking with the correct person using two identifiers.  Location Provider Location : [facility] Patient Location : Work  Participants: Patient , Provider   I discussed the limitations of evaluation and management by telemedicine and the availability of in person appointments. The patient expressed understanding and agreed to proceed.   I discussed the assessment and treatment plan with the patient. The patient was provided an opportunity to ask questions and all were answered. The patient agreed with the plan and demonstrated an understanding of the instructions.   The patient was advised to call back or seek an in-person evaluation if the symptoms worsen or if the condition fails to improve as anticipated.   BH MD OP Progress Note  06/27/2024 4:25 PM Candice [name]  MRN:  [id number]  Chief Complaint:  Chief Complaint  Patient presents with   Medication Refill   Follow-up   Anxiety   Depression   Discussed the use of AI scribe software for clinical note transcription with the patient, who gave verbal consent to proceed.  History of Present Illness Candice [name] is a 38 year old Caucasian female, employed, lives in [place] a history of MDD, anxiety disorder, insomnia, bilateral hearing loss, migraine headaches was evaluated by telemedicine today.  She reports that her mood has remained generally stable and feels that her current dose of Effexor  (venlafaxine  75 mg and 37.5 mg daily) has been effective. She continues to take Wellbutrin  (bupropion ) twice daily. Ongoing psychosocial stress related to her husband being unemployed for almost 3 months has created financial and emotional strain, as she serves as the sole provider. She notes that her husband's mental health challenges and lack of motivation to seek employment  have increased her emotional burden, and she feels she is reaching her breaking point. Suppressing her own feelings to support her husband, she acknowledges that this situation is taking a toll on her mental health.  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 She recognizes the benefit of therapy but struggles with the lack of immediate results and has prioritized her husband's needs over her own.  She continues to experience persistent sleep difficulties, including frequent nighttime awakenings and trouble achieving restful sleep. She has not taken trazodone  consistently, which she added at her last visit, and has only tried it a few times, typically on weekends. She remains unsure of its effectiveness due to inconsistent use but does not need a refill at this time. She describes waking up multiple times throughout the night, often needing to reposition for comfort, and sometimes sleeping in her daughter's room to help her adjust to a new bed. She experiences sleep disruption regardless of where she sleeps.  She is scheduled for a sleep study by her primary care provider.  She denies any thoughts of harming herself or others.    Visit Diagnosis:    ICD-10-CM   1. MDD (major depressive disorder), recurrent, in full remission  F33.42 buPROPion  (WELLBUTRIN  SR) 150 MG 12 hr tablet    2. Other specified anxiety disorders  F41.8 venlafaxine  XR (EFFEXOR -XR) 37.5 MG 24 hr capsule   with limited symptom attacks    3. Insomnia due to mental condition  F51.05    Mood, pain  Past Psychiatric History: I have reviewed past psychiatric history from progress note on 01/10/2019.  Past Medical History:  Past Medical History:  Diagnosis Date   Anxiety    Depression    Hearing loss    Migraines    a. x 10+ years.   Postpartum care following cesarean delivery  (5/29) 02/04/2014   Pre-syncope    a. 07/2015 [facility] ED visit, ? vasovagal, improved with IVF.   Short cervix in second trimester, antepartum 11/16/2013    Past Surgical History:  Procedure Laterality Date   CESAREAN SECTION N/A 02/03/2014   Procedure: Primary Cesarean Section Delivery Baby Girl @ 2244, Apgars 9/9;  Surgeon: Dickie DELENA Carder, MD;  Location: [facility];  Service: Obstetrics;  Laterality: N/A;   CESAREAN SECTION     WISDOM TOOTH EXTRACTION  2002    Family Psychiatric History: Reviewed family psychiatric history from progress note on 01/10/2019.  Family History:  Family History  Problem Relation Age of Onset   Depression Mother    Anxiety disorder Mother    Heart attack Mother        NSTEMI @ age 32.   Migraines Mother    Sleep apnea Father        alive @ 9.   Anxiety disorder Maternal Grandmother    Depression Maternal Grandmother    Diabetes Maternal Grandmother    Hypertension Maternal Grandmother    Heart attack Maternal Grandfather    Alcohol abuse Paternal Grandfather    Suicidality Paternal Grandfather    Seizures Daughter     Social History: I have reviewed social history from progress note on 01/10/2019. Social History   Socioeconomic History   Marital status: Married    Spouse name: Not on file   Number of children: 1   Years of education: Not on file   Highest education level: Not on file  Occupational History   Not on file  Tobacco Use   Smoking status: Never   Smokeless tobacco: Never  Vaping Use   Vaping status: Never Used  Substance and Sexual Activity   Alcohol use: Yes    Alcohol/week: 0.0 standard drinks of alcohol    Comment: maybe two drinks/month.   Drug use: No   Sexual activity: Yes    Birth control/protection: Pill  Other Topics Concern   Not on file  Social History Narrative   Lives in [place] with her husband and 69 yr old daughter.  Does not routinely exercise.   Caffeine : 1 cup coffee/day, some soda , 1-2 cups of  caffeine /day   Social Drivers of Health   Financial Resource Strain: Low Risk  (05/23/2024)   [facility]   Overall Financial Resource Strain (CARDIA)    Difficulty of Paying Living Expenses: Not very hard  Food Insecurity: No Food Insecurity (05/23/2024)   [facility] from [facility]   Hunger Vital Sign    Within the past 12 months, you worried that your food would run out before you got the money to buy more.: Never true    Within the past 12 months, the food you bought just didn't last and you didn't have money to get more.: Never true  Transportation Needs: No Transportation Needs (02/10/2024)   Received from [facility] - Transportation    In the past 12 months, has lack of transportation kept you from medical appointments or from getting medications?: No    Lack of Transportation (Non-Medical):  No  Physical Activity: Inactive (07/13/2018)   Exercise Vital Sign    Days of Exercise per Week: 0 days    Minutes of Exercise per Session: 0 min  Stress: Not on file  Social Connections: Unknown (07/13/2018)   Social Connection and Isolation Panel    Frequency of Communication with Friends and Family: Not on file    Frequency of Social Gatherings with Friends and Family: Not on file    Attends Religious Services: Not on file    Active Member of Clubs or Organizations: Not on file    Attends Banker Meetings: Not on file    Marital Status: Married    Allergies:  Allergies  Allergen Reactions   Elemental Sulfur Hives   Sulfa Antibiotics Other (See Comments) and Hives    Metabolic Disorder Labs: No results found for: HGBA1C, MPG No results found for: PROLACTIN No results found for: CHOL, TRIG, HDL, CHOLHDL, VLDL, LDLCALC Lab Results  Component Value Date   TSH 0.696 07/12/2015    Therapeutic Level Labs: No results found for: LITHIUM No results found for: VALPROATE No  results found for: CBMZ  Current Medications: Current Outpatient Medications  Medication Sig Dispense Refill   ALPRAZolam  (XANAX ) 0.25 MG tablet Take 1 tablet (0.25 mg total) by mouth at bedtime as needed for anxiety. 15 tablet 0   Atogepant  (QULIPTA ) 60 MG TABS Take 1 tablet (60 mg total) by mouth daily. 30 tablet 5   buPROPion  (WELLBUTRIN  SR) 150 MG 12 hr tablet Take 1 tablet (150 mg total) by mouth 2 (two) times daily. Take daily at 8:30 AM and at 2 PM 180 tablet 1   cyanocobalamin  (VITAMIN B12) 1000 MCG/ML injection Inject 1 mL (1,000 mcg total) into the muscle every 30 (thirty) days. (Patient not taking: Reported on 04/20/2024) 1 mL 5   ergocalciferol  (VITAMIN D2) 1.25 MG (50000 UT) capsule Take 1 capsule (50,000 Units total) by mouth once a week. (Patient not taking: Reported on 04/20/2024) 12 capsule 1   ergocalciferol  (VITAMIN D2) 1.25 MG (50000 UT) capsule Take 1 capsule (50,000 Units total) by mouth once a week. 12 capsule 1   levonorgestrel -ethinyl estradiol  (NORDETTE) 0.15-30 MG-MCG tablet Take 1 tablet by mouth daily. 112 tablet 1   methylPREDNISolone  (MEDROL  DOSEPAK) 4 MG TBPK tablet Per package instructions 21 tablet 0   ondansetron  (ZOFRAN -ODT) 8 MG disintegrating tablet Dissolve 1 tablet (8 mg total) in mouth every 8 (eight) hours as needed. 20 tablet 2   propranolol  ER (INDERAL  LA) 60 MG 24 hr capsule Take 1 capsule (60 mg total) by mouth at bedtime. 30 capsule 5   Rimegepant Sulfate  (NURTEC) 75 MG TBDP Take 1 tablet by mouth daily as needed. 10 tablet 3   scopolamine  (TRANSDERM-SCOP) 1 MG/3DAYS Place 1 patch (1 mg total) onto the skin every 3 (three) days. 10 patch 0   semaglutide -weight management (WEGOVY ) 0.25 MG/0.5ML SOAJ SQ injection Inject 0.25 mg into the skin once a week. 2 mL 1   traZODone  (DESYREL ) 50 MG tablet Take 0.5-1 tablets (25-50 mg total) by mouth at bedtime as needed for sleep. 30 tablet 0   Tuberculin-Allergy  Syringes (VANISHPOINT TUBERCULIN SYRINGE) 25G X 1  1 ML MISC Use as directed (for b12 inj) for up to 6 doses (Patient not taking: Reported on 04/20/2024)     Ubrogepant  (UBRELVY ) 100 MG TABS Take at the onset of migraine. Can repeat in 2 hours if needed. Only 2 tabs in 24 hours. 10 tablet 12  valACYclovir  (VALTREX ) 1000 MG tablet Take 2 tablets (2,000 mg total) by mouth 2 (two) times daily. Take for 2 days and monitor. 10 tablet 0   venlafaxine  XR (EFFEXOR -XR) 37.5 MG 24 hr capsule Take 1 capsule (37.5 mg total) by mouth daily with breakfast. Take daily along with 75 mg daily , total of 112.5 mg daily 90 capsule 1   venlafaxine  XR (EFFEXOR -XR) 75 MG 24 hr capsule Take 1 capsule (75 mg total) by mouth daily. Take along with 37.5 mg daily 90 capsule 1   Zavegepant HCl (ZAVZPRET ) 10 MG/ACT SOLN Place 10 mg into the nose daily as needed. For migraine. Only 1 spray in 24 hours.     No current facility-administered medications for this visit.     Musculoskeletal: Strength & Muscle Tone: UTA Gait & Station: Seated Patient leans: N/A  Psychiatric Specialty Exam: Review of Systems  Psychiatric/Behavioral:  Positive for sleep disturbance. The patient is nervous/anxious.     There were no vitals taken for this visit.There is no height or weight on file to calculate BMI.  General Appearance: Casual  Eye Contact:  Fair  Speech:  Clear and Coherent  Volume:  Normal  Mood:  Anxious  Affect:  Congruent  Thought Process:  Goal Directed and Descriptions of Associations: Intact  Orientation:  Full (Time, Place, and Person)  Thought Content: Logical   Suicidal Thoughts:  No  Homicidal Thoughts:  No  Memory:  Immediate;   Fair Recent;   Fair Remote;   Fair  Judgement:  Fair  Insight:  Fair  Psychomotor Activity:  Normal  Concentration:  Concentration: Fair and Attention Span: Fair  Recall:  [employer] of Knowledge: Fair  Language: Fair  Akathisia:  No  Handed:  Right  AIMS (if indicated): not done  Assets:  Communication Skills Desire for  Improvement Housing Social Support Talents/Skills  ADL's:  Intact  Cognition: WNL  Sleep:  Improving on medications, but does not take medication every night   Screenings: GAD-7    Flowsheet Row Office Visit from 11/02/2023 in [facility] Office Visit from 09/23/2022 in [facility] Office Visit from 05/26/2022 in [facility] Office Visit from 02/19/2022 in [facility]  Total GAD-7 Score 12 5 4 5    PHQ2-9    Flowsheet Row Office Visit from 11/02/2023 in [facility] Counselor from 11/20/2022 in [facility] Visit from 09/23/2022 in [facility] Office Visit from 05/26/2022 in [facility] Counselor from 05/13/2022 in [facility] at [facility] Total Score 2 1 2  0 1  PHQ-9 Total Score 9 7 5 2  --   Flowsheet Row Video Visit from 06/27/2024 in [facility] Video Visit from 04/19/2024 in [facility] Video Visit from 01/07/2024 in [facility]  C-SSRS RISK CATEGORY No Risk No Risk No Risk     Assessment and Plan:Candice [name] is a 38 year old Caucasian female who presented for a follow-up appointment.  Discussed assessment and plan as noted below.  1. MDD (major depressive disorder), recurrent, in full remission Currently does report sleep problems mostly due to lack of sleep hygiene and being noncompliant on trazodone .  Otherwise denies any significant depression symptoms Continue Wellbutrin  150 mg twice daily Continue Venlafaxine   extended release 112.5 mg daily Continue psychotherapy sessions with Ms. Izetta bound  2.  Other specified anxiety disorders-improving Currently does have anxiety mostly related to situational stressors and is motivated to stay in therapy which does help Continue Venlafaxine  extended release 112.5 mg daily Continue Xanax  0.25 mg as needed for severe anxiety attacks  3. Insomnia due to mental condition-unstable Sleep problems mostly due to lack of sleep hygiene and being noncompliant with trazodone . Encouraged compliance with Trazodone  25-50 mg at bedtime as needed. Encouraged sleep hygiene techniques  Follow-up Follow-up in clinic in 3 months or sooner in person    Collaboration of Care: Collaboration of Care: Referral or follow-up with counselor/therapist AEB encouraged to continue psychotherapy sessions.  Patient/Guardian was advised Release of Information must be obtained prior to any record release in order to collaborate their care with an outside provider. Patient/Guardian was advised if they have not already done so to contact the registration department to sign all necessary forms in order for us  to release information regarding their care.   Consent: Patient/Guardian gives verbal consent for treatment and assignment of benefits for services provided during this visit. Patient/Guardian expressed understanding and agreed to proceed.  This note was generated in part or whole with voice recognition software. Voice recognition is usually quite accurate but there are transcription errors that can and very often do occur. I apologize for any typographical errors that were not detected and corrected.     Tensley Wery, MD 06/28/2024, 12:51 PM

## 2024-07-01 ENCOUNTER — Other Ambulatory Visit: Payer: Self-pay

## 2024-07-11 ENCOUNTER — Other Ambulatory Visit: Payer: Self-pay

## 2024-07-11 ENCOUNTER — Other Ambulatory Visit (HOSPITAL_COMMUNITY): Payer: Self-pay

## 2024-07-11 MED ORDER — ONABOTULINUMTOXINA 200 UNITS IJ SOLR
INTRAMUSCULAR | 3 refills | Status: AC
  Filled 2024-07-11: qty 1, fill #0
  Filled 2024-07-12: qty 1, 84d supply, fill #0
  Filled 2024-09-28: qty 1, 84d supply, fill #1

## 2024-07-11 NOTE — Addendum Note (Signed)
 Addended by: SHERRYL DUWAINE SQUIBB on: 07/11/2024 10:09 AM   Modules accepted: Orders

## 2024-07-11 NOTE — Addendum Note (Signed)
 Addended by: HILLIARD HEATHER CROME on: 07/11/2024 09:57 AM   Modules accepted: Orders

## 2024-07-11 NOTE — Telephone Encounter (Signed)
 Auth was approved, please send rx to [facility].  Auth#: [id number] (06/24/24-12/21/24)

## 2024-07-12 ENCOUNTER — Other Ambulatory Visit (HOSPITAL_COMMUNITY): Payer: Self-pay

## 2024-07-12 ENCOUNTER — Other Ambulatory Visit: Payer: Self-pay

## 2024-07-12 NOTE — Progress Notes (Unsigned)
 Specialty Pharmacy Initial Fill Coordination Note  Candice [name] is a 38 y.o. female contacted today regarding initial fill of specialty medication(s) OnabotulinumtoxinA  (BOTOX )   Patient requested Courier to Provider Office   Delivery date: 07/14/24   Verified address: [address], [place], [place] [address]   Medication will be filled on 07/12/2024.   Patient is aware of 0 copayment.

## 2024-07-13 ENCOUNTER — Other Ambulatory Visit: Payer: Self-pay

## 2024-07-14 ENCOUNTER — Other Ambulatory Visit: Payer: Self-pay

## 2024-07-20 ENCOUNTER — Ambulatory Visit: Admitting: Adult Health

## 2024-07-20 ENCOUNTER — Ambulatory Visit (INDEPENDENT_AMBULATORY_CARE_PROVIDER_SITE_OTHER): Admitting: Adult Health

## 2024-07-20 VITALS — BP 146/98 | HR 113

## 2024-07-20 DIAGNOSIS — G43709 Chronic migraine without aura, not intractable, without status migrainosus: Secondary | ICD-10-CM

## 2024-07-20 MED ORDER — ONABOTULINUMTOXINA 200 UNITS IJ SOLR
155.0000 [IU] | Freq: Once | INTRAMUSCULAR | Status: AC
  Administered 2024-07-20: 155 [IU] via INTRAMUSCULAR

## 2024-07-20 NOTE — Progress Notes (Signed)
 07/20/24: Patient reports that after her last Botox  she had some discomfort in the right shoulder.  But that resolved after a day or 2.  She also reported vertigo at her last Botox  appointment.  She did do physical therapy which was not helpful.  She states at the beginning of August it finally subsided.  She would like to avoid injecting then shoulders this visit.  04/20/24: Patient reports that typically Botox  works well for her.  This last week and a half she has had an ongoing migraine.  We did send in a Medrol  Dosepak which she does not feel is beneficial.  She has 2 days left of her steroid Dosepak.  She states that the migraine is behind the right eye.  It started last Tuesday.  She started having vertigo on Thursday.  She does have a history of vertigo and with severe headaches will get vertigo with her migraines.No aura. Denies numbness, tingling and weakness.  Reports photophobia and phonophobia.  Reports nausea but no vomiting.  She has had a Toradol  injection in the past but unsure how beneficial it has been.  She has tried Nurtec but has not resolved the headache.  She states that the headache severity has varied over the last week.  Mo states she has been able to manage but she did have to call out of work today.  She remains on Qulipta . she would like to proceed with Botox  today.  Has not had a yearly office visit with Dr. Ines.  Will get that scheduled today.  Will give her Zavzpret  sample to see if it is beneficial for recurrent headache.  1 spray in the nostril.  Only 1 spray in 24 hours.  She was given 2 samples single dosing.  Information provided on her AVS.  Advised to not take with Nurtec or Ubrelvy .  We did discuss Toradol  however she is on steroids which can increase her risk of bleeding.  May consider Toradol  if headache does not resolve.  Physical exam was relatively unremarkable.  Last MRI was in 2024 which was unremarkable.  Generalized: Well developed  Neurological examination   Mentation: Alert oriented to time, place, history taking. Follows all commands speech and language fluent Cranial nerve II-XII: Pupils were equal round reactive to light extraocular movements were full, visual field were full on confrontational test. Facial sensation and strength were normal. Uvula tongue midline. Head turning and shoulder shrug  were normal and symmetric. Motor: The motor testing reveals 5 over 5 strength of all 4 extremities. Good symmetric motor tone is noted throughout.  Sensory: Sensory testing is intact to  soft touch, v on all 4 extremities. No evidence of extinction is noted.  Coordination: Cerebellar testing reveals good finger-nose-finger and heel-to-shin bilaterally.  Gait and station: Gait is normal. Tandem gait is mildly unsteady. Romberg is negative. No drift is seen.       01/20/24: Last Wednesday got a headache behind the right eye that has lingered. Reports that she has not been compliant with qulipta  and other meds. Nurtec typically works well.   10/27/23: Reports that she usually gets several days of breakthrough headaches before Botox .  Only had 2 days of headaches before her Botox  this time. Has not been taking qulipta   regularly. Nurtec works well to get rd of headaces.   07/29/23: Last two weeks have had headaches. Had one headache that lasted 9 days. Got a steriod pack but not sure it has helped. Still doing qulipta  daily. In the  last 5-7 days doing nurtec daily.   05/06/23: Reports that she did start having breakthrough headaches 2 to 3 weeks before Botox  today.  She did try taking Nurtec several days in a row.  She also has Ubrelvy  for abortive therapy.  02/05/23: Botox  works well.  Also taking Qulipta  daily.  She has Nurtec and Ubrelvy  for abortive therapy.  She is advised not to take these medications together.  She saw Dr. Ines in April for an office visit  10/30/22: Botox  continues to work well.  After the last injection she developed a mild headache  with nausea and vomiting.  This lasted about 6 days and then resolved.  She never had the symptoms after any previous Botox .  Advised patient that I did not feel that this was related to her Botox  injections.  Also discussed with Dr. Buck who did not feel this would be related to her Botox  injections  08/04/22: Reports that Botox  works well except for the last 2 weeks if she starts to have breakthrough headaches.  She states that she typically has 2 mild headaches during the week that she can take Excedrin Migraine.  Has not had any severe headaches.  Tries to take Nurtec as a preventative medicine 2 weeks before her Botox  is due.  Will use Ubrelvy  as an abortive medication if needed  05/06/22: Botox  is working well.  She is reports that she has been having trouble with vertigo not associated with her migraines.  Will be start vestibular rehab  02/06/22: Tried nurtec daily for the last week but not sure it helped? Maybe knocked the edge off. Otherwise her headaches are controlled. Has a mild headache 1-2 times a week. Usually will try OTC medicine first then will use ubrelvy . Encouraged her to use the ubrelvy  first thing.  11/12/20: Patient states that Botox  is working well for her.  She states the week before her Botox  is due she tends to have more headaches.  She is using the Ubrelvy  and sometimes nurtrec. Advised that she could try taking nurtec daily 5 days before her cycle to try to prevent migraines.    BOTOX  PROCEDURE NOTE FOR MIGRAINE HEADACHE    Contraindications and precautions discussed with patient(above). Aseptic procedure was observed and patient tolerated procedure. Procedure performed by Duwaine Russell, NP  The condition has existed for more than 6 months, and pt does not have a diagnosis of ALS, Myasthenia Gravis or Lambert-Eaton Syndrome.  Risks and benefits of injections discussed and pt agrees to proceed with the procedure.  Written consent obtained  These injections are medically  necessary. These injections do not cause sedations or hallucinations which the oral therapies may cause.  Indication/Diagnosis: chronic migraine BOTOX (G9414) injection was performed according to protocol by [employer]. 200 units of BOTOX  was dissolved into 4 cc NS.   NDC: [address]-98  Type of toxin: Botox   Botox - 200 units x 1 vial Lot: [id number]  Expiration: 02/2026  NDC: 9976-6078-97  Bacteriostatic 0.9% Sodium Chloride - 4  mL  Lot: [id number] Expiration: 06/2025 NDC: 9590-8033-97   Dx: H56.290         Description of procedure:  The patient was placed in a sitting position. The standard protocol was used for Botox  as follows, with 5 units of Botox  injected at each site:   -Procerus muscle, midline injection  -Corrugator muscle, bilateral injection  -Frontalis muscle, bilateral injection, with 2 sites each side, medial injection was performed in the upper one third of the frontalis muscle, in the region  vertical from the medial inferior edge of the superior orbital rim. The lateral injection was again in the upper one third of the forehead vertically above the lateral limbus of the cornea, 1.5 cm lateral to the medial injection site.  -Temporalis muscle injection, 4 sites, bilaterally. The first injection was 3 cm above the tragus of the ear, second injection site was 1.5 cm to 3 cm up from the first injection site in line with the tragus of the ear. The third injection site was 1.5-3 cm forward between the first 2 injection sites. The fourth injection site was 1.5 cm posterior to the second injection site.  -Occipitalis muscle injection, 3 sites, bilaterally. The first injection was done one half way between the occipital protuberance and the tip of the mastoid process behind the ear. The second injection site was done lateral and superior to the first, 1 fingerbreadth from the first injection. The third injection site was 1 fingerbreadth superiorly and medially from the first  injection site.  -Cervical paraspinal muscle injection, 2 sites, bilateral knee first injection site was 1 cm from the midline of the cervical spine, 3 cm inferior to the lower border of the occipital protuberance. The second injection site was 1.5 cm superiorly and laterally to the first injection site.  -Trapezius muscle deferred   Will return for repeat injection in 3 months.   A 200 unitsof Botox  was used, 125 units were injected, the rest of the Botox  was wasted. The patient tolerated the procedure well, there were no complications of the above procedure.  Duwaine Russell, MSN, NP-C 07/20/2024, 11:49 AM [facility] [address], [address] [place], [place] [address] [phone]

## 2024-07-20 NOTE — Progress Notes (Signed)
 Botox - 200 units x 1 vial Lot: [id number]  Expiration: 02/2026  NDC: 9976-6078-97  Bacteriostatic 0.9% Sodium Chloride - 4  mL  Lot: [id number] Expiration: 06/2025 NDC: 9590-8033-97   Dx: H56.290 S/P  Witnessed by Stephania LATHER

## 2024-07-26 ENCOUNTER — Telehealth: Payer: Self-pay | Admitting: Pharmacist

## 2024-07-26 NOTE — Telephone Encounter (Signed)
 Pharmacy Patient Advocate Encounter  Received notification from [facility] that Prior Authorization for Qulipta  60MG  tablets has been APPROVED from 07/26/2024 to 07/25/2025   PA #/Case ID/Reference #: [id number]

## 2024-07-26 NOTE — Telephone Encounter (Signed)
 Pharmacy Patient Advocate Encounter   Received notification from CoverMyMeds that prior authorization for Qulipta  60MG  tablets is required/requested.   Insurance verification completed.   The patient is insured through [facility].   Per test claim: PA required; PA submitted to above mentioned insurance via CoverMyMeds Key/confirmation #/EOC [id number] Status is pending

## 2024-07-27 ENCOUNTER — Other Ambulatory Visit: Payer: Self-pay

## 2024-07-28 ENCOUNTER — Other Ambulatory Visit: Payer: Self-pay

## 2024-07-28 MED ORDER — VALACYCLOVIR HCL 1 G PO TABS
2000.0000 mg | ORAL_TABLET | Freq: Two times a day (BID) | ORAL | 0 refills | Status: DC
  Filled 2024-07-28: qty 10, 3d supply, fill #0

## 2024-08-17 ENCOUNTER — Other Ambulatory Visit (HOSPITAL_COMMUNITY): Payer: Self-pay

## 2024-08-17 ENCOUNTER — Telehealth: Payer: Self-pay

## 2024-08-17 NOTE — Telephone Encounter (Signed)
 Pharmacy Patient Advocate Encounter   Received notification from CoverMyMeds that prior authorization for Nurtec 75MG  dispersible tablets is required/requested.   Insurance verification completed.   The patient is insured through [facility].   Prior Authorization for Nurtec 75MG  dispersible tablets has been APPROVED from 08-17-2024 to 08-16-2025   PA #/Case ID/Reference #: [id number]

## 2024-08-18 ENCOUNTER — Other Ambulatory Visit: Payer: Self-pay

## 2024-08-19 ENCOUNTER — Other Ambulatory Visit: Payer: Self-pay

## 2024-08-19 MED ORDER — LEVONORGESTREL-ETHINYL ESTRAD 0.15-30 MG-MCG PO TABS
1.0000 | ORAL_TABLET | Freq: Every day | ORAL | 1 refills | Status: AC
  Filled 2024-08-19: qty 112, 84d supply, fill #0

## 2024-08-22 DIAGNOSIS — G4733 Obstructive sleep apnea (adult) (pediatric): Secondary | ICD-10-CM | POA: Diagnosis not present

## 2024-08-24 MED FILL — Venlafaxine HCl Cap ER 24HR 75 MG (Base Equivalent): ORAL | 90 days supply | Qty: 90 | Fill #1 | Status: AC

## 2024-08-26 ENCOUNTER — Other Ambulatory Visit: Payer: Self-pay

## 2024-08-29 DIAGNOSIS — Z01419 Encounter for gynecological examination (general) (routine) without abnormal findings: Secondary | ICD-10-CM | POA: Diagnosis not present

## 2024-09-05 ENCOUNTER — Other Ambulatory Visit: Payer: Self-pay

## 2024-09-09 ENCOUNTER — Other Ambulatory Visit: Payer: Self-pay

## 2024-09-10 MED FILL — Venlafaxine HCl Cap ER 24HR 75 MG (Base Equivalent): ORAL | 90 days supply | Qty: 90 | Fill #1 | Status: AC

## 2024-09-27 ENCOUNTER — Other Ambulatory Visit: Payer: Self-pay

## 2024-09-27 ENCOUNTER — Encounter: Payer: Self-pay | Admitting: Psychiatry

## 2024-09-27 ENCOUNTER — Ambulatory Visit: Admitting: Psychiatry

## 2024-09-27 VITALS — BP 130/78 | HR 112 | Temp 97.8°F | Ht 64.0 in | Wt 193.6 lb

## 2024-09-27 DIAGNOSIS — F5105 Insomnia due to other mental disorder: Secondary | ICD-10-CM | POA: Diagnosis not present

## 2024-09-27 DIAGNOSIS — F3342 Major depressive disorder, recurrent, in full remission: Secondary | ICD-10-CM

## 2024-09-27 DIAGNOSIS — F418 Other specified anxiety disorders: Secondary | ICD-10-CM

## 2024-09-27 MED ORDER — ALPRAZOLAM 0.25 MG PO TABS
0.2500 mg | ORAL_TABLET | Freq: Every evening | ORAL | 0 refills | Status: AC | PRN
  Filled 2024-09-27: qty 15, 15d supply, fill #0

## 2024-09-27 NOTE — Progress Notes (Unsigned)
 [facility] MD OP Progress Note  09/27/2024 5:05 PM Candice [name]  MRN:  [id number]  Chief Complaint:  Chief Complaint  Patient presents with   Follow-up   Anxiety   Depression   Medication Refill   Discussed the use of AI scribe software for clinical note transcription with the patient, who gave verbal consent to proceed.  History of Present Illness Candice [name] is a 39 year old Caucasian female, employed, [place], has a history of MDD, anxiety disorder, insomnia, bilateral hearing loss, migraine headaches was evaluated in office today for a follow-up appointment.  Ongoing stress related to her husband's unemployment and his mental health challenges continues to impact her, as she describes him as not being in a good mental place and having difficulty coping since losing his job in July.  She identifies the financial strain from his unemployment and recent issues with a rental property as major stressors, noting that the property sustained damage and required costly cleanup, further impacting her finances.  She describes her own anxiety as generally well managed but notes experiencing acute episodes, particularly in response to current stressors. Her current medication regimen includes venlafaxine  37.5 mg and 75 mg (total 112.5 mg daily), bupropion , and trazodone  as needed for sleep, and she uses alprazolam  (Xanax ) very limitedly as needed. She finds her medications generally effective for her anxiety, though situational stress persists. She has not seen her therapist, Izetta, in approximately 3 months.  She denies significant depressive symptoms, including sadness, anhedonia, or difficulty performing work. She reports no changes in appetite and states that her eating is generally good. She denies any current suicidality, homicidality or perceptual disturbances.  Concern about weight gain over the past year persists, as she notes an increase from 176 to 193 pounds since February of  last year. She describes efforts to manage her weight, including tracking meals and calories with an app, using a WalkFit app to increase physical activity, and choosing healthier snacks. She reports that her weight gain has affected her self-image, stating that she feels gross when looking in the mirror. Her husband provides support regarding her weight concerns. She reports completing a sleep study last month, which showed mild sleep apnea but did not require intervention. She describes her diet as generally healthy, with minimal snacking and no large portions or sweets, and she continues to seek ways to improve her physical activity and metabolism.  She denies any other concerns today.   Visit Diagnosis:    ICD-10-CM   1. MDD (major depressive disorder), recurrent, in full remission  F33.42     2. Other specified anxiety disorders  F41.8 ALPRAZolam  (XANAX ) 0.25 MG tablet   with limited symptom attacks    3. Insomnia due to mental condition  F51.05    Mood, pain      Past Psychiatric History: I have reviewed past psychiatric history from progress note on 01/10/2019.  Past Medical History:  Past Medical History:  Diagnosis Date   Anxiety    Depression    Hearing loss    Migraines    a. x 10+ years.   Postpartum care following cesarean delivery (5/29) 02/04/2014   Pre-syncope    a. 07/2015 [facility] ED visit, ? vasovagal, improved with IVF.   Short cervix in second trimester, antepartum 11/16/2013    Past Surgical History:  Procedure Laterality Date   CESAREAN SECTION N/A 02/03/2014   Procedure: Primary Cesarean Section Delivery Baby Girl @ 2244, Apgars 9/9;  Surgeon: Dickie DELENA Carder,  MD;  Location: [facility];  Service: Obstetrics;  Laterality: N/A;   CESAREAN SECTION     WISDOM TOOTH EXTRACTION  2002    Family Psychiatric History: I have reviewed family psychiatric history from progress note on 01/10/2019.  Family History:  Family History  Problem Relation Age of Onset    Depression Mother    Anxiety disorder Mother    Heart attack Mother        NSTEMI @ age 71.   Migraines Mother    Sleep apnea Father        alive @ 41.   Anxiety disorder Maternal Grandmother    Depression Maternal Grandmother    Diabetes Maternal Grandmother    Hypertension Maternal Grandmother    Heart attack Maternal Grandfather    Alcohol abuse Paternal Grandfather    Suicidality Paternal Grandfather    Seizures Daughter     Social History: I have reviewed social history from progress note on 01/10/2019. Social History   Socioeconomic History   Marital status: Married    Spouse name: Not on file   Number of children: 1   Years of education: Not on file   Highest education level: Not on file  Occupational History   Not on file  Tobacco Use   Smoking status: Never   Smokeless tobacco: Never  Vaping Use   Vaping status: Never Used  Substance and Sexual Activity   Alcohol use: Yes    Alcohol/week: 0.0 standard drinks of alcohol    Comment: maybe two drinks/month.   Drug use: No   Sexual activity: Yes    Birth control/protection: Pill  Other Topics Concern   Not on file  Social History Narrative   Lives in [place] with her husband and 36 yr old daughter.  Does not routinely exercise.   Caffeine : 1 cup coffee/day, some soda , 1-2 cups of caffeine /day   Social Drivers of Health   Tobacco Use: Low Risk (09/27/2024)   Patient History    Smoking Tobacco Use: Never    Smokeless Tobacco Use: Never    Passive Exposure: Not on file  Financial Resource Strain: Low Risk  (05/23/2024)   Received from [facility]   Overall Financial Resource Strain (CARDIA)    Difficulty of Paying Living Expenses: Not very hard  Food Insecurity: No Food Insecurity (05/23/2024)   Received from [facility]   Epic    Within the past 12 months, you worried that your food would run out before you got the money to buy more.: Never true    Within the past 12  months, the food you bought just didn't last and you didn't have money to get more.: Never true  Transportation Needs: No Transportation Needs (02/10/2024)   Received from [facility] Transportation    In the past 12 months, has lack of transportation kept you from medical appointments or from getting medications?: No    Lack of Transportation (Non-Medical): No  Physical Activity: Not on file  Stress: Not on file  Social Connections: Not on file  Depression (EYV7-0): Medium Risk (11/02/2023)   Depression (PHQ2-9)    PHQ-2 Score: 9  Alcohol Screen: Not on file  Housing: Low Risk  (05/23/2024)   Received from [facility]   Epic    In the last 12 months, was there a time when you were not able to pay the mortgage or rent on time?: No  In the past 12 months, how many times have you moved where you were living?: 0    At any time in the past 12 months, were you homeless or living in a shelter (including now)?: No  Utilities: Not At Risk (05/23/2024)   Received from [facility]   Epic    In the past 12 months has the electric, gas, oil, or water company threatened to shut off services in your home?: No  Health Literacy: Not on file    Allergies: Allergies[1]  Metabolic Disorder Labs: No results found for: HGBA1C, MPG No results found for: PROLACTIN No results found for: CHOL, TRIG, HDL, CHOLHDL, VLDL, LDLCALC Lab Results  Component Value Date   TSH 0.696 07/12/2015    Therapeutic Level Labs: No results found for: LITHIUM No results found for: VALPROATE No results found for: CBMZ  Current Medications: Current Outpatient Medications  Medication Sig Dispense Refill   ALPRAZolam  (XANAX ) 0.25 MG tablet Take 1 tablet (0.25 mg total) by mouth at bedtime as needed for anxiety. 15 tablet 0   Atogepant  (QULIPTA ) 60 MG TABS Take 1 tablet (60 mg total) by mouth daily. 30 tablet 5   botulinum toxin Type  A (BOTOX ) 200 units injection Provider to inject 155 units into the muscles of the head and neck every 12 weeks. Discard remainder. 1 each 3   buPROPion  (WELLBUTRIN  SR) 150 MG 12 hr tablet Take 1 tablet (150 mg total) by mouth 2 (two) times daily. Take daily at 8:30 AM and at 2 PM 180 tablet 1   cyanocobalamin  (VITAMIN B12) 1000 MCG/ML injection Inject 1 mL (1,000 mcg total) into the muscle every 30 (thirty) days. (Patient not taking: Reported on 04/20/2024) 1 mL 5   ergocalciferol  (VITAMIN D2) 1.25 MG (50000 UT) capsule Take 1 capsule (50,000 Units total) by mouth once a week. 12 capsule 1   ergocalciferol  (VITAMIN D2) 1.25 MG (50000 UT) capsule Take 1 capsule (50,000 Units total) by mouth once a week. 12 capsule 1   levonorgestrel -ethinyl estradiol  (NORDETTE) 0.15-30 MG-MCG tablet Take 1 tablet by mouth daily. 112 tablet 1   methylPREDNISolone  (MEDROL  DOSEPAK) 4 MG TBPK tablet Per package instructions 21 tablet 0   ondansetron  (ZOFRAN -ODT) 8 MG disintegrating tablet Dissolve 1 tablet (8 mg total) in mouth every 8 (eight) hours as needed. 20 tablet 2   propranolol  ER (INDERAL  LA) 60 MG 24 hr capsule Take 1 capsule (60 mg total) by mouth at bedtime. 30 capsule 5   Rimegepant Sulfate  (NURTEC) 75 MG TBDP Take 1 tablet by mouth daily as needed. 10 tablet 3   scopolamine  (TRANSDERM-SCOP) 1 MG/3DAYS Place 1 patch (1 mg total) onto the skin every 3 (three) days. 10 patch 0   semaglutide -weight management (WEGOVY ) 0.25 MG/0.5ML SOAJ SQ injection Inject 0.25 mg into the skin once a week. 2 mL 1   traZODone  (DESYREL ) 50 MG tablet Take 0.5-1 tablets (25-50 mg total) by mouth at bedtime as needed for sleep. 30 tablet 0   Tuberculin-Allergy  Syringes (VANISHPOINT TUBERCULIN SYRINGE) 25G X 1 1 ML MISC Use as directed (for b12 inj) for up to 6 doses (Patient not taking: Reported on 04/20/2024)     Ubrogepant  (UBRELVY ) 100 MG TABS Take at the onset of migraine. Can repeat in 2 hours if needed. Only 2 tabs in 24 hours.  10 tablet 12   valACYclovir  (VALTREX ) 1000 MG tablet Take 2 tablets (2,000 mg total) by mouth 2 (two) times daily. Take for 2 days and  monitor. 10 tablet 0   valACYclovir  (VALTREX ) 1000 MG tablet Take 2 tablets (2,000 mg total) by mouth 2 (two) times daily. Take for 2 days and monitor. 10 tablet 0   venlafaxine  XR (EFFEXOR -XR) 37.5 MG 24 hr capsule Take 1 capsule (37.5 mg total) by mouth daily with breakfast. Take daily along with 75 mg daily , total of 112.5 mg daily 90 capsule 1   venlafaxine  XR (EFFEXOR -XR) 75 MG 24 hr capsule Take 1 capsule (75 mg total) by mouth daily. Take along with 37.5 mg daily 90 capsule 1   Zavegepant HCl (ZAVZPRET ) 10 MG/ACT SOLN Place 10 mg into the nose daily as needed. For migraine. Only 1 spray in 24 hours.     No current facility-administered medications for this visit.     Musculoskeletal: Strength & Muscle Tone: within normal limits Gait & Station: normal Patient leans: N/A  Psychiatric Specialty Exam: Review of Systems  Psychiatric/Behavioral:  The patient is nervous/anxious.     Blood pressure 130/78, pulse (!) 112, temperature 97.8 F (36.6 C), temperature source Temporal, height 5' 4 (1.626 m), weight 193 lb 9.6 oz (87.8 kg).Body mass index is 33.23 kg/m.  General Appearance: Casual  Eye Contact:  Fair  Speech:  Clear and Coherent  Volume:  Normal  Mood:  Anxious  Affect:  Appropriate  Thought Process:  Goal Directed and Descriptions of Associations: Intact  Orientation:  Full (Time, Place, and Person)  Thought Content: Logical   Suicidal Thoughts:  No  Homicidal Thoughts:  No  Memory:  Immediate;   Fair Recent;   Fair Remote;   Fair  Judgement:  Fair  Insight:  Fair  Psychomotor Activity:  Normal  Concentration:  Concentration: Fair and Attention Span: Fair  Recall:  [employer] of Knowledge: Fair  Language: Fair  Akathisia:  No  Handed:  Right  AIMS (if indicated): not done  Assets:  Communication Skills Desire for  Improvement Housing Social Support Transportation  ADL's:  Intact  Cognition: WNL  Sleep:  Fair   Screenings: GAD-7    Garment/textile Technologist Visit from 11/02/2023 in [facility] Office Visit from 09/23/2022 in [facility] Office Visit from 05/26/2022 in [facility] Office Visit from 02/19/2022 in [facility]  Total GAD-7 Score 12 5 4 5    PHQ2-9    Flowsheet Row Office Visit from 11/02/2023 in [facility] Counselor from 11/20/2022 in [facility] at [facility] Visit from 09/23/2022 in [facility] Office Visit from 05/26/2022 in [facility] Counselor from 05/13/2022 in [facility] at [facility] Total Score 2 1 2  0 1  PHQ-9 Total Score 9 7 5 2  --   Flowsheet Row Video Visit from 06/27/2024 in [facility] Video Visit from 04/19/2024 in [facility] Video Visit from 01/07/2024 in [facility]  C-SSRS RISK CATEGORY No Risk No Risk No Risk     Assessment and Plan: Candice [name] is a 39 year old Caucasian female who presented for a follow-up appointment, discussed assessment and plan as noted below.  1. MDD (major depressive disorder), recurrent, in full remission Currently denies any significant depression symptoms Continue Wellbutrin  150 mg twice daily Continue Venlafaxine  extended release 112.5 mg daily   2. Other specified  anxiety disorders-unstable Ongoing anxiety mostly due to situational stressors.  Encouraged to reestablish care with therapist.  Last appointment was several months ago. Continue CBT with Ms.Katie  Bound Continue Venlafaxine  extended release 112.5 mg daily Continue Xanax  0.25 mg as needed Reviewed [place] PMP AWARxE   3. Insomnia due to mental condition-improving Currently reports sleep is improving Continue sleep hygiene techniques Continue Trazodone  25-50 mg at bedtime as needed  Encouraged to work on increasing exercise activities.  Follow-up Follow-up in clinic in 3 months or sooner if needed.    Collaboration of Care: Collaboration of Care: Referral or follow-up with counselor/therapist AEB encouraged to follow-up with therapist  Patient/Guardian was advised Release of Information must be obtained prior to any record release in order to collaborate their care with an outside provider. Patient/Guardian was advised if they have not already done so to contact the registration department to sign all necessary forms in order for us  to release information regarding their care.   Consent: Patient/Guardian gives verbal consent for treatment and assignment of benefits for services provided during this visit. Patient/Guardian expressed understanding and agreed to proceed.  This note was generated in part or whole with voice recognition software. Voice recognition is usually quite accurate but there are transcription errors that can and very often do occur. I apologize for any typographical errors that were not detected and corrected.     Rica Heather, MD 09/27/2024, 5:05 PM     [1]  Allergies Allergen Reactions   Elemental Sulfur Hives   Sulfa Antibiotics Other (See Comments) and Hives   Sulfamethoxazole-Trimethoprim Hives and Rash

## 2024-09-28 ENCOUNTER — Other Ambulatory Visit: Payer: Self-pay

## 2024-09-28 ENCOUNTER — Other Ambulatory Visit: Payer: Self-pay | Admitting: Pharmacy Technician

## 2024-09-28 NOTE — Progress Notes (Signed)
 Specialty Pharmacy Refill Coordination Note  Candice [name] is a 39 y.o. female assessed today regarding refills of clinic administered specialty medication(s) OnabotulinumtoxinA  (BOTOX )   Clinic requested No data recorded  Delivery date: 10/06/24   Verified address: [address], [place], [place] [address]   Medication will be filled on: 10/05/24

## 2024-10-05 ENCOUNTER — Other Ambulatory Visit: Payer: Self-pay

## 2024-10-05 MED ORDER — VALACYCLOVIR HCL 1 G PO TABS
2000.0000 mg | ORAL_TABLET | Freq: Two times a day (BID) | ORAL | 0 refills | Status: AC
  Filled 2024-10-05: qty 10, 3d supply, fill #0

## 2024-10-06 ENCOUNTER — Other Ambulatory Visit (HOSPITAL_COMMUNITY): Payer: Self-pay

## 2024-10-06 ENCOUNTER — Other Ambulatory Visit: Payer: Self-pay | Admitting: Adult Health

## 2024-10-06 ENCOUNTER — Other Ambulatory Visit: Payer: Self-pay

## 2024-10-06 ENCOUNTER — Telehealth (HOSPITAL_COMMUNITY): Payer: Self-pay

## 2024-10-06 MED ORDER — UBRELVY 100 MG PO TABS
ORAL_TABLET | ORAL | 12 refills | Status: AC
  Filled 2024-10-06: qty 10, 30d supply, fill #0

## 2024-10-06 MED FILL — Ondansetron Orally Disintegrating Tab 8 MG: ORAL | 7 days supply | Qty: 20 | Fill #2 | Status: AC

## 2024-10-11 ENCOUNTER — Other Ambulatory Visit: Payer: Self-pay

## 2024-10-11 ENCOUNTER — Other Ambulatory Visit (HOSPITAL_COMMUNITY): Payer: Self-pay

## 2024-10-11 NOTE — Telephone Encounter (Signed)
 No PA required. Process as 16 tablets per 30 days

## 2024-10-19 ENCOUNTER — Ambulatory Visit: Admitting: Adult Health

## 2024-12-05 ENCOUNTER — Ambulatory Visit: Admitting: Neurology

## 2024-12-28 ENCOUNTER — Telehealth: Admitting: Psychiatry
